# Patient Record
Sex: Female | Born: 1960 | Race: Black or African American | Hispanic: No | Marital: Married | Smoking: Never smoker
Health system: Southern US, Community
[De-identification: ages and names within clinical notes are randomized; demographics above are authoritative.]

## PROBLEM LIST (undated history)

## (undated) DIAGNOSIS — M1990 Unspecified osteoarthritis, unspecified site: Secondary | ICD-10-CM

## (undated) DIAGNOSIS — Z8711 Personal history of peptic ulcer disease: Secondary | ICD-10-CM

## (undated) DIAGNOSIS — T7840XA Allergy, unspecified, initial encounter: Secondary | ICD-10-CM

## (undated) DIAGNOSIS — I10 Essential (primary) hypertension: Secondary | ICD-10-CM

## (undated) DIAGNOSIS — R51 Headache: Secondary | ICD-10-CM

## (undated) DIAGNOSIS — E119 Type 2 diabetes mellitus without complications: Secondary | ICD-10-CM

## (undated) DIAGNOSIS — D649 Anemia, unspecified: Secondary | ICD-10-CM

## (undated) DIAGNOSIS — K648 Other hemorrhoids: Secondary | ICD-10-CM

## (undated) DIAGNOSIS — Z8719 Personal history of other diseases of the digestive system: Secondary | ICD-10-CM

## (undated) HISTORY — DX: Unspecified osteoarthritis, unspecified site: M19.90

## (undated) HISTORY — DX: Allergy, unspecified, initial encounter: T78.40XA

## (undated) HISTORY — DX: Headache: R51

## (undated) HISTORY — DX: Type 2 diabetes mellitus without complications: E11.9

## (undated) HISTORY — DX: Anemia, unspecified: D64.9

## (undated) HISTORY — PX: TUBAL LIGATION: SHX77

## (undated) HISTORY — DX: Essential (primary) hypertension: I10

## (undated) HISTORY — PX: CARPAL TUNNEL RELEASE: SHX101

## (undated) HISTORY — DX: Other hemorrhoids: K64.8

---

## 1998-10-27 ENCOUNTER — Emergency Department (HOSPITAL_COMMUNITY): Admission: EM | Admit: 1998-10-27 | Discharge: 1998-10-27 | Payer: Self-pay | Admitting: Emergency Medicine

## 1999-06-08 ENCOUNTER — Encounter: Payer: Self-pay | Admitting: Emergency Medicine

## 1999-06-08 ENCOUNTER — Emergency Department (HOSPITAL_COMMUNITY): Admission: EM | Admit: 1999-06-08 | Discharge: 1999-06-08 | Payer: Self-pay | Admitting: Emergency Medicine

## 1999-11-01 ENCOUNTER — Emergency Department (HOSPITAL_COMMUNITY): Admission: EM | Admit: 1999-11-01 | Discharge: 1999-11-01 | Payer: Self-pay | Admitting: Emergency Medicine

## 2000-10-19 ENCOUNTER — Inpatient Hospital Stay (HOSPITAL_COMMUNITY): Admission: AD | Admit: 2000-10-19 | Discharge: 2000-10-19 | Payer: Self-pay | Admitting: Obstetrics

## 2001-01-26 ENCOUNTER — Emergency Department (HOSPITAL_COMMUNITY): Admission: EM | Admit: 2001-01-26 | Discharge: 2001-01-26 | Payer: Self-pay | Admitting: Emergency Medicine

## 2001-01-27 ENCOUNTER — Encounter: Payer: Self-pay | Admitting: Emergency Medicine

## 2002-08-27 ENCOUNTER — Emergency Department (HOSPITAL_COMMUNITY): Admission: EM | Admit: 2002-08-27 | Discharge: 2002-08-27 | Payer: Self-pay | Admitting: *Deleted

## 2003-01-17 ENCOUNTER — Emergency Department (HOSPITAL_COMMUNITY): Admission: EM | Admit: 2003-01-17 | Discharge: 2003-01-18 | Payer: Self-pay | Admitting: Emergency Medicine

## 2003-10-24 ENCOUNTER — Emergency Department (HOSPITAL_COMMUNITY): Admission: AD | Admit: 2003-10-24 | Discharge: 2003-10-24 | Payer: Self-pay | Admitting: Family Medicine

## 2003-10-26 ENCOUNTER — Emergency Department (HOSPITAL_COMMUNITY): Admission: EM | Admit: 2003-10-26 | Discharge: 2003-10-26 | Payer: Self-pay | Admitting: Emergency Medicine

## 2004-01-03 ENCOUNTER — Emergency Department (HOSPITAL_COMMUNITY): Admission: EM | Admit: 2004-01-03 | Discharge: 2004-01-03 | Payer: Self-pay | Admitting: Emergency Medicine

## 2004-09-13 ENCOUNTER — Emergency Department (HOSPITAL_COMMUNITY): Admission: EM | Admit: 2004-09-13 | Discharge: 2004-09-13 | Payer: Self-pay | Admitting: Emergency Medicine

## 2006-08-10 ENCOUNTER — Emergency Department (HOSPITAL_COMMUNITY): Admission: EM | Admit: 2006-08-10 | Discharge: 2006-08-10 | Payer: Self-pay | Admitting: Emergency Medicine

## 2007-08-01 ENCOUNTER — Emergency Department (HOSPITAL_COMMUNITY): Admission: EM | Admit: 2007-08-01 | Discharge: 2007-08-01 | Payer: Self-pay | Admitting: Emergency Medicine

## 2007-09-02 ENCOUNTER — Emergency Department (HOSPITAL_COMMUNITY): Admission: EM | Admit: 2007-09-02 | Discharge: 2007-09-02 | Payer: Self-pay | Admitting: Emergency Medicine

## 2008-01-10 ENCOUNTER — Emergency Department (HOSPITAL_COMMUNITY): Admission: EM | Admit: 2008-01-10 | Discharge: 2008-01-10 | Payer: Self-pay | Admitting: Family Medicine

## 2008-08-20 ENCOUNTER — Emergency Department (HOSPITAL_COMMUNITY): Admission: EM | Admit: 2008-08-20 | Discharge: 2008-08-20 | Payer: Self-pay | Admitting: Emergency Medicine

## 2008-09-14 ENCOUNTER — Emergency Department (HOSPITAL_COMMUNITY): Admission: EM | Admit: 2008-09-14 | Discharge: 2008-09-14 | Payer: Self-pay | Admitting: Emergency Medicine

## 2008-09-27 ENCOUNTER — Emergency Department (HOSPITAL_COMMUNITY): Admission: EM | Admit: 2008-09-27 | Discharge: 2008-09-27 | Payer: Self-pay | Admitting: Family Medicine

## 2009-02-11 ENCOUNTER — Emergency Department (HOSPITAL_COMMUNITY): Admission: EM | Admit: 2009-02-11 | Discharge: 2009-02-11 | Payer: Self-pay | Admitting: Family Medicine

## 2009-05-05 ENCOUNTER — Emergency Department (HOSPITAL_COMMUNITY): Admission: EM | Admit: 2009-05-05 | Discharge: 2009-05-05 | Payer: Self-pay | Admitting: Emergency Medicine

## 2009-08-27 ENCOUNTER — Emergency Department (HOSPITAL_COMMUNITY): Admission: EM | Admit: 2009-08-27 | Discharge: 2009-08-27 | Payer: Self-pay | Admitting: Emergency Medicine

## 2010-01-26 ENCOUNTER — Ambulatory Visit: Payer: Self-pay | Admitting: Internal Medicine

## 2010-01-26 DIAGNOSIS — J45909 Unspecified asthma, uncomplicated: Secondary | ICD-10-CM | POA: Insufficient documentation

## 2010-01-26 DIAGNOSIS — J309 Allergic rhinitis, unspecified: Secondary | ICD-10-CM | POA: Insufficient documentation

## 2010-01-26 DIAGNOSIS — R519 Headache, unspecified: Secondary | ICD-10-CM | POA: Insufficient documentation

## 2010-01-26 DIAGNOSIS — R51 Headache: Secondary | ICD-10-CM

## 2010-02-05 ENCOUNTER — Ambulatory Visit: Payer: Self-pay | Admitting: Internal Medicine

## 2010-02-05 DIAGNOSIS — R9431 Abnormal electrocardiogram [ECG] [EKG]: Secondary | ICD-10-CM

## 2010-02-05 DIAGNOSIS — R0789 Other chest pain: Secondary | ICD-10-CM | POA: Insufficient documentation

## 2010-02-05 LAB — CONVERTED CEMR LAB
ALT: 20 units/L (ref 0–35)
AST: 18 units/L (ref 0–37)
Albumin: 3.4 g/dL — ABNORMAL LOW (ref 3.5–5.2)
Alkaline Phosphatase: 124 units/L — ABNORMAL HIGH (ref 39–117)
BUN: 11 mg/dL (ref 6–23)
Basophils Absolute: 0 10*3/uL (ref 0.0–0.1)
Basophils Relative: 1 % (ref 0.0–3.0)
Bilirubin, Direct: 0.1 mg/dL (ref 0.0–0.3)
CK-MB: 1.4 ng/mL (ref 0.3–4.0)
CO2: 27 meq/L (ref 19–32)
Calcium: 9 mg/dL (ref 8.4–10.5)
Chloride: 106 meq/L (ref 96–112)
Cholesterol: 163 mg/dL (ref 0–200)
Creatinine, Ser: 1 mg/dL (ref 0.4–1.2)
Eosinophils Absolute: 0.1 10*3/uL (ref 0.0–0.7)
Eosinophils Relative: 2.7 % (ref 0.0–5.0)
GFR calc non Af Amer: 75.91 mL/min (ref >60)
Glucose, Bld: 90 mg/dL (ref 70–99)
HCT: 36.8 % (ref 36.0–46.0)
HDL: 46.5 mg/dL (ref >39.00)
Hemoglobin: 11.6 g/dL — ABNORMAL LOW (ref 12.0–15.0)
LDL Cholesterol: 104 mg/dL — ABNORMAL HIGH (ref 0–99)
Lymphocytes Relative: 55.9 % — ABNORMAL HIGH (ref 12.0–46.0)
Lymphs Abs: 2.7 10*3/uL (ref 0.7–4.0)
MCHC: 31.6 g/dL (ref 30.0–36.0)
MCV: 83.5 fL (ref 78.0–100.0)
Monocytes Absolute: 0.4 10*3/uL (ref 0.1–1.0)
Monocytes Relative: 7.9 % (ref 3.0–12.0)
Neutro Abs: 1.5 10*3/uL (ref 1.4–7.7)
Neutrophils Relative %: 32.5 % — ABNORMAL LOW (ref 43.0–77.0)
Platelets: 200 10*3/uL (ref 150.0–400.0)
Potassium: 4.1 meq/L (ref 3.5–5.1)
RBC: 4.41 M/uL (ref 3.87–5.11)
RDW: 14.6 % (ref 11.5–14.6)
Sodium: 141 meq/L (ref 135–145)
TSH: 1.06 microintl units/mL (ref 0.35–5.50)
Total Bilirubin: 0.6 mg/dL (ref 0.3–1.2)
Total CHOL/HDL Ratio: 4
Total CK: 178 units/L — ABNORMAL HIGH (ref 7–177)
Total Protein: 7.8 g/dL (ref 6.0–8.3)
Triglycerides: 63 mg/dL (ref 0.0–149.0)
VLDL: 12.6 mg/dL (ref 0.0–40.0)
WBC: 4.7 10*3/uL (ref 4.5–10.5)

## 2010-02-09 ENCOUNTER — Telehealth (INDEPENDENT_AMBULATORY_CARE_PROVIDER_SITE_OTHER): Payer: Self-pay | Admitting: *Deleted

## 2010-02-10 ENCOUNTER — Ambulatory Visit: Payer: Self-pay

## 2010-02-10 ENCOUNTER — Encounter (HOSPITAL_COMMUNITY): Admission: RE | Admit: 2010-02-10 | Discharge: 2010-04-08 | Payer: Self-pay | Admitting: Internal Medicine

## 2010-02-10 ENCOUNTER — Ambulatory Visit: Payer: Self-pay | Admitting: Cardiology

## 2010-02-11 ENCOUNTER — Ambulatory Visit: Payer: Self-pay | Admitting: Internal Medicine

## 2010-02-20 ENCOUNTER — Telehealth: Payer: Self-pay | Admitting: Internal Medicine

## 2010-02-23 ENCOUNTER — Ambulatory Visit: Payer: Self-pay | Admitting: Internal Medicine

## 2010-07-29 ENCOUNTER — Telehealth: Payer: Self-pay | Admitting: Internal Medicine

## 2010-11-23 ENCOUNTER — Telehealth: Payer: Self-pay | Admitting: Internal Medicine

## 2010-12-08 NOTE — Assessment & Plan Note (Signed)
Summary: ear complaints / SD   Vital Signs:  Patient profile:   50 year old female Menstrual status:  irregular Height:      61.5 inches Weight:      226 pounds BMI:     42.16 O2 Sat:      96 % on Room air Temp:     97.9 degrees F oral Pulse rate:   73 / minute Pulse rhythm:   regular Resp:     16 per minute BP sitting:   102 / 68  (left arm) Cuff size:   large  Vitals Entered By: Rock Nephew CMA (February 11, 2010 11:00 AM)  Nutrition Counseling: Patient's BMI is greater than 25 and therefore counseled on weight management options.  O2 Flow:  Room air  Primary Care Provider:  Etta Grandchild MD  CC:  Ear pain.  History of Present Illness:  Ear Pain      This is a 50 year old woman who presents with Ear pain.  The symptoms began 2 days ago.  The severity is described as mild.  The patient also reports sensation of fullness, but denies ear discharge, hearing loss, tinnitus, fever, sinus pain, nasal discharge, and jaw click.  The pain is located in both ears.  The pain is described as constant.  The patient denies headache, night grinding of teeth, popping or crackling sounds, pressure, toothache, dizziness, and vertigo.    Preventive Screening-Counseling & Management  Alcohol-Tobacco     Alcohol drinks/day: 0     Smoking Status: never  Hep-HIV-STD-Contraception     Hepatitis Risk: no risk noted     HIV Risk: no risk noted     STD Risk: no risk noted  Medications Prior to Update: 1)  None  Current Medications (verified): 1)  Nasonex 50 Mcg/act Susp (Mometasone Furoate) .... 2 Puffs Each Nostril Once Daily  Allergies (verified): No Known Drug Allergies  Past History:  Past Medical History: Reviewed history from 01/26/2010 and no changes required. Allergic rhinitis Asthma Headache  Past Surgical History: Reviewed history from 01/26/2010 and no changes required. Tubal ligation  Family History: Reviewed history from 01/26/2010 and no changes  required. Family History of Arthritis Family History Diabetes 1st degree relative Family History Hypertension  Social History: Reviewed history from 01/26/2010 and no changes required. Married Unemployed Never Smoked Alcohol use-no Drug use-no Regular exercise-no  Review of Systems  The patient denies anorexia, fever, weight loss, chest pain, peripheral edema, prolonged cough, abdominal pain, hematuria, suspicious skin lesions, and enlarged lymph nodes.   ENT:  Complains of earache, nasal congestion, and postnasal drainage; denies decreased hearing, difficulty swallowing, ear discharge, hoarseness, nosebleeds, ringing in ears, sinus pressure, and sore throat.  Physical Exam  General:  alert, well-developed, well-nourished, well-hydrated, appropriate dress, normal appearance, healthy-appearing, and cooperative to examination.   Head:  normocephalic, atraumatic, no abnormalities observed, and no abnormalities palpated.   Eyes:  vision grossly intact, pupils equal, pupils round, and pupils reactive to light.   Ears:  R ear normal and L ear normal.   Nose:  no airflow obstruction, no intranasal foreign body, no nasal polyps, no nasal mucosal lesions, no mucosal friability, no active bleeding or clots, no sinus percussion tenderness, no septum abnormalities, nasal dischargemucosal pallor, and mucosal edema.   Mouth:  Oral mucosa and oropharynx without lesions or exudates.  Teeth in good repair. Neck:  No deformities, masses, or tenderness noted. Lungs:  Normal respiratory effort, chest expands symmetrically. Lungs are clear  to auscultation, no crackles or wheezes. Heart:  Normal rate and regular rhythm. S1 and S2 normal without gallop, murmur, click, rub or other extra sounds. Abdomen:  Bowel sounds positive,abdomen soft and non-tender without masses, organomegaly or hernias noted. Msk:  No deformity or scoliosis noted of thoracic or lumbar spine.   Pulses:  R and L  carotid,radial,femoral,dorsalis pedis and posterior tibial pulses are full and equal bilaterally Extremities:  No clubbing, cyanosis, edema, or deformity noted with normal full range of motion of all joints.   Neurologic:  No cranial nerve deficits noted. Station and gait are normal. Plantar reflexes are down-going bilaterally. DTRs are symmetrical throughout. Sensory, motor and coordinative functions appear intact. Skin:  Intact without suspicious lesions or rashes Cervical Nodes:  no anterior cervical adenopathy and no posterior cervical adenopathy.   Axillary Nodes:  no R axillary adenopathy and no L axillary adenopathy.   Psych:  Cognition and judgment appear intact. Alert and cooperative with normal attention span and concentration. No apparent delusions, illusions, hallucinations   Impression & Recommendations:  Problem # 1:  ALLERGIC RHINITIS (ICD-477.9) Assessment Deteriorated  try depo-medrol IM Her updated medication list for this problem includes:    Nasonex 50 Mcg/act Susp (Mometasone furoate) .Marland Kitchen... 2 puffs each nostril once daily  Discussed use of allergy medications and environmental measures.   Orders: Admin of Therapeutic Inj  intramuscular or subcutaneous (56387) Depo- Medrol 80mg  (J1040) Depo- Medrol 40mg  (J1030)  Complete Medication List: 1)  Nasonex 50 Mcg/act Susp (Mometasone furoate) .... 2 puffs each nostril once daily  Patient Instructions: 1)  Please schedule a follow-up appointment in 2 months. Prescriptions: NASONEX 50 MCG/ACT SUSP (MOMETASONE FUROATE) 2 puffs each nostril once daily  #4 inhs. x 0   Entered and Authorized by:   Etta Grandchild MD   Signed by:   Etta Grandchild MD on 02/11/2010   Method used:   Samples Given   RxID:   [id number]    Medication Administration  Injection # 1:    Medication: Depo- Medrol 40mg     Diagnosis: ALLERGIC RHINITIS (ICD-477.9)    Route: IM    Site: R deltoid    Exp Date: 09/2012    Lot #: [id number]     Mfr: pfizer    Patient tolerated injection without complications    Given by: Rock Nephew CMA (February 11, 2010 11:01 AM)  Injection # 2:    Medication: Depo- Medrol 80mg     Diagnosis: ALLERGIC RHINITIS (ICD-477.9)    Route: IM    Site: R deltoid    Exp Date: 09/2012    Lot #: [id number]    Mfr: pfizer    Patient tolerated injection without complications    Given by: Rock Nephew CMA (February 11, 2010 11:01 AM)  Orders Added: 1)  Admin of Therapeutic Inj  intramuscular or subcutaneous [96372] 2)  Depo- Medrol 80mg  [J1040] 3)  Depo- Medrol 40mg  [J1030] 4)  Est. Patient Level IV [63016]

## 2010-12-08 NOTE — Progress Notes (Signed)
  Phone Note Other Incoming   Caller: pt. Summary of Call: she called requesting a cough medicine Initial call taken by: Etta Grandchild MD,  February 20, 2010 2:03 PM  Follow-up for Phone Call        Rx was given to her husband today for her to fill it Follow-up by: Etta Grandchild MD,  February 20, 2010 2:04 PM    New/Updated Medications: Sandria Senter ER 8-10 MG/5ML LQCR (CHLORPHENIRAMINE-HYDROCODONE) 5 ml by mouth two times a day as needed for cough Prescriptions: TUSSIONEX PENNKINETIC ER 8-10 MG/5ML LQCR (CHLORPHENIRAMINE-HYDROCODONE) 5 ml by mouth two times a day as needed for cough  #4 ounces x 0   Entered and Authorized by:   Etta Grandchild MD   Signed by:   Etta Grandchild MD on 02/20/2010   Method used:   Print then Give to Patient   RxID:   [id number]

## 2010-12-08 NOTE — Assessment & Plan Note (Signed)
Summary: continued dizzyness/ SD   Vital Signs:  Patient profile:   50 year old female Menstrual status:  irregular Height:      61 inches Weight:      227 pounds BMI:     43.05 O2 Sat:      98 % on Room air Temp:     98.3 degrees F oral Pulse rate:   62 / minute Pulse rhythm:   regular Resp:     16 per minute BP sitting:   122 / 80  (left arm) Cuff size:   large  Vitals Entered By: Rock Nephew CMA (February 05, 2010 10:08 AM)  Nutrition Counseling: Patient's BMI is greater than 25 and therefore counseled on weight management options.  O2 Flow:  Room air CC: dizziness w/ SOB and chest pain Is Patient Diabetic? No Pain Assessment Patient in pain? yes     Location: chest   Primary Care Provider:  Etta Grandchild MD  CC:  dizziness w/ SOB and chest pain.  History of Present Illness: She returns and states that her URI symptoms have resolved but she has developed some chest pain, DOE, and dizziness for about one week intermittently.  Preventive Screening-Counseling & Management  Alcohol-Tobacco     Alcohol drinks/day: 0     Smoking Status: never  Hep-HIV-STD-Contraception     Hepatitis Risk: no risk noted     HIV Risk: no risk noted     STD Risk: no risk noted      Sexual History:  currently monogamous.        Drug Use:  no.        Blood Transfusions:  no.    Medications Prior to Update: 1)  Mytussin Ac 100-10 Mg/31ml Syrp (Guaifenesin-Codeine) .... 5-10 Ml By Mouth Qid As Needed For Cough  Current Medications (verified): 1)  None  Allergies (verified): No Known Drug Allergies  Past History:  Past Medical History: Reviewed history from 01/26/2010 and no changes required. Allergic rhinitis Asthma Headache  Past Surgical History: Reviewed history from 01/26/2010 and no changes required. Tubal ligation  Family History: Reviewed history from 01/26/2010 and no changes required. Family History of Arthritis Family History Diabetes 1st degree  relative Family History Hypertension  Social History: Reviewed history from 01/26/2010 and no changes required. Married Unemployed Never Smoked Alcohol use-no Drug use-no Regular exercise-no  Review of Systems       The patient complains of weight gain, chest pain, and dyspnea on exertion.  The patient denies anorexia, fever, weight loss, syncope, peripheral edema, prolonged cough, headaches, hemoptysis, abdominal pain, melena, hematochezia, severe indigestion/heartburn, hematuria, suspicious skin lesions, difficulty walking, depression, enlarged lymph nodes, and angioedema.   CV:  Complains of chest pain or discomfort, lightheadness, and shortness of breath with exertion; denies bluish discoloration of lips or nails, difficulty breathing at night, difficulty breathing while lying down, fainting, fatigue, leg cramps with exertion, near fainting, palpitations, swelling of feet, swelling of hands, and weight gain. Resp:  Complains of chest discomfort; denies chest pain with inspiration, cough, coughing up blood, excessive snoring, hypersomnolence, morning headaches, pleuritic, shortness of breath, sputum productive, and wheezing.  Physical Exam  General:  Well-developed,well-nourished,in no acute distress; alert,appropriate and cooperative throughout examination and overweight-appearing.   Head:  Normocephalic and atraumatic without obvious abnormalities. No apparent alopecia or balding. Eyes:  vision grossly intact, pupils equal, pupils round, and pupils reactive to light.   Mouth:  Oral mucosa and oropharynx without lesions or exudates.  Teeth in  good repair. Neck:  No deformities, masses, or tenderness noted. Lungs:  Normal respiratory effort, chest expands symmetrically. Lungs are clear to auscultation, no crackles or wheezes. Heart:  Normal rate and regular rhythm. S1 and S2 normal without gallop, murmur, click, rub or other extra sounds. Abdomen:  Bowel sounds positive,abdomen soft and  non-tender without masses, organomegaly or hernias noted. Msk:  No deformity or scoliosis noted of thoracic or lumbar spine.   Pulses:  R and L carotid,radial,femoral,dorsalis pedis and posterior tibial pulses are full and equal bilaterally Extremities:  No clubbing, cyanosis, edema, or deformity noted with normal full range of motion of all joints.   Neurologic:  No cranial nerve deficits noted. Station and gait are normal. Plantar reflexes are down-going bilaterally. DTRs are symmetrical throughout. Sensory, motor and coordinative functions appear intact. Skin:  Intact without suspicious lesions or rashes Cervical Nodes:  no anterior cervical adenopathy and no posterior cervical adenopathy.   Axillary Nodes:  no R axillary adenopathy and no L axillary adenopathy.   Psych:  Cognition and judgment appear intact. Alert and cooperative with normal attention span and concentration. No apparent delusions, illusions, hallucinations Additional Exam:  EKG shows NSR with diffuse flattening of the T waves but normal ST segments and no Q waves. There are no old EKG's for comparison.   Impression & Recommendations:  Problem # 1:  ELECTROCARDIOGRAM, ABNORMAL (ICD-794.31) Assessment New will look at labs for metabolic causes of abn. EKG and will look for ischemia/PE/CHF/abnormal lytes/renal Orders: Venipuncture ([address]) TLB-BMP (Basic Metabolic Panel-BMET) (80048-METABOL) TLB-CBC Platelet - w/Differential (85025-CBCD) TLB-Hepatic/Liver Function Pnl (80076-HEPATIC) TLB-TSH (Thyroid Stimulating Hormone) (84443-TSH) TLB-Lipid Panel (80061-LIPID) TLB-CK-MB (Creatine Kinase MB) (82553-CKMB) TLB-CK Total Only(Creatine Kinase/CPK) (82550-CK) T-D-Dimer Fibrin Derivatives Quantitive (60454-09811) Cardiolite (Cardiolite) EKG w/ Interpretation (93000)  Problem # 2:  CHEST PAIN, ATYPICAL (ICD-786.59) Assessment: New will screen for CAD with a cardiolite and check for ischemia today. Orders: Venipuncture  (91478) TLB-BMP (Basic Metabolic Panel-BMET) (80048-METABOL) TLB-CBC Platelet - w/Differential (85025-CBCD) TLB-Hepatic/Liver Function Pnl (80076-HEPATIC) TLB-TSH (Thyroid Stimulating Hormone) (84443-TSH) TLB-Lipid Panel (80061-LIPID) TLB-CK-MB (Creatine Kinase MB) (82553-CKMB) TLB-CK Total Only(Creatine Kinase/CPK) (82550-CK) T-D-Dimer Fibrin Derivatives Quantitive (29562-13086) Cardiolite (Cardiolite) EKG w/ Interpretation (93000)  Patient Instructions: 1)  Please schedule a follow-up appointment in 2 weeks.

## 2010-12-08 NOTE — Assessment & Plan Note (Signed)
Summary: NEW/ [employer]/ HEADACHE/DIZZY/SINUS CONGESTION/NWS   Vital Signs:  Patient profile:   50 year old female Menstrual status:  irregular LMP:     12/22/2009 Height:      61 inches Weight:      225.50 pounds BMI:     42.76 O2 Sat:      96 % on Room air Temp:     98.3 degrees F oral Pulse rate:   87 / minute Pulse rhythm:   regular Resp:     16 per minute BP sitting:   138 / 82  (left arm) Cuff size:   large  Vitals Entered By: Rock Nephew CMA (January 26, 2010 3:40 PM)  Nutrition Counseling: Patient's BMI is greater than 25 and therefore counseled on weight management options.  O2 Flow:  Room air CC: headache, congestion, cough, dizziness, L side ear pain x 01/23/10, URI symptoms Is Patient Diabetic? No Pain Assessment Patient in pain? yes     Location: L ear LMP (date): 12/22/2009     Menstrual Status irregular Enter LMP: 12/22/2009   Primary Care Provider:  Etta Grandchild MD  CC:  headache, congestion, cough, dizziness, L side ear pain x 01/23/10, and URI symptoms.  History of Present Illness:  URI Symptoms      This is a 50 year old woman who presents with URI symptoms.  The symptoms began 2 weeks ago.  The severity is described as moderate.  The patient reports nasal congestion, purulent nasal discharge, sore throat, and dry cough, but denies productive cough, earache, and sick contacts.  The patient denies fever, stiff neck, dyspnea, wheezing, rash, vomiting, diarrhea, use of an antipyretic, and response to antipyretic.  The patient denies sneezing, seasonal symptoms, headache, muscle aches, and severe fatigue.  Risk factors for Strep sinusitis include unilateral facial pain, unilateral nasal discharge, poor response to decongestant, and double sickening.  The patient denies the following risk factors for Strep sinusitis: tooth pain, Strep exposure, tender adenopathy, and absence of cough.    Preventive Screening-Counseling & Management  Alcohol-Tobacco  Alcohol drinks/day: 0     Smoking Status: never  Caffeine-Diet-Exercise     Does Patient Exercise: no  Hep-HIV-STD-Contraception     Hepatitis Risk: no risk noted     HIV Risk: no risk noted     STD Risk: no risk noted      Sexual History:  currently monogamous.        Drug Use:  no.        Blood Transfusions:  no.    Medications Prior to Update: 1)  None  Current Medications (verified): 1)  Amoxicillin 500 Mg Cap (Amoxicillin) .... Take 1 Capsule By Mouth Three Times A Day X 10 Days 2)  Mytussin Ac 100-10 Mg/15ml Syrp (Guaifenesin-Codeine) .... 5-10 Ml By Mouth Qid As Needed For Cough  Allergies (verified): No Known Drug Allergies  Past History:  Past Medical History: Allergic rhinitis Asthma Headache  Past Surgical History: Tubal ligation  Family History: Family History of Arthritis Family History Diabetes 1st degree relative Family History Hypertension  Social History: Married Unemployed Never Smoked Alcohol use-no Drug use-no Regular exercise-no Smoking Status:  never Hepatitis Risk:  no risk noted HIV Risk:  no risk noted STD Risk:  no risk noted Sexual History:  currently monogamous Blood Transfusions:  no Drug Use:  no Does Patient Exercise:  no  Review of Systems       The patient complains of weight gain.  The patient denies  anorexia, fever, weight loss, chest pain, syncope, dyspnea on exertion, peripheral edema, prolonged cough, hemoptysis, abdominal pain, hematuria, suspicious skin lesions, enlarged lymph nodes, and angioedema.    Physical Exam  General:  Well-developed,well-nourished,in no acute distress; alert,appropriate and cooperative throughout examination Head:  Normocephalic and atraumatic without obvious abnormalities. No apparent alopecia or balding. Ears:  R ear normal and L ear normal.   Nose:  no external deformity, no airflow obstruction, no intranasal foreign body, no nasal polyps, no nasal mucosal lesions, no mucosal  friability, no active bleeding or clots, no septum abnormalities, nasal dischargemucosal pallor, L maxillary sinus tenderness, and R maxillary sinus tenderness.   Mouth:  Oral mucosa and oropharynx without lesions or exudates.  Teeth in good repair. Neck:  No deformities, masses, or tenderness noted. Lungs:  Normal respiratory effort, chest expands symmetrically. Lungs are clear to auscultation, no crackles or wheezes. Heart:  Normal rate and regular rhythm. S1 and S2 normal without gallop, murmur, click, rub or other extra sounds. Abdomen:  Bowel sounds positive,abdomen soft and non-tender without masses, organomegaly or hernias noted. Msk:  No deformity or scoliosis noted of thoracic or lumbar spine.   Pulses:  R and L carotid,radial,femoral,dorsalis pedis and posterior tibial pulses are full and equal bilaterally Extremities:  No clubbing, cyanosis, edema, or deformity noted with normal full range of motion of all joints.   Neurologic:  No cranial nerve deficits noted. Station and gait are normal. Plantar reflexes are down-going bilaterally. DTRs are symmetrical throughout. Sensory, motor and coordinative functions appear intact. Skin:  Intact without suspicious lesions or rashes Cervical Nodes:  no anterior cervical adenopathy and no posterior cervical adenopathy.   Axillary Nodes:  no R axillary adenopathy and no L axillary adenopathy.   Psych:  Cognition and judgment appear intact. Alert and cooperative with normal attention span and concentration. No apparent delusions, illusions, hallucinations   Impression & Recommendations:  Problem # 1:  SINUSITIS- ACUTE-NOS (ICD-461.9) Assessment New  Her updated medication list for this problem includes:    Amoxicillin 500 Mg Cap (Amoxicillin) .Marland Kitchen... Take 1 capsule by mouth three times a day x 10 days    Mytussin Ac 100-10 Mg/12ml Syrp (Guaifenesin-codeine) .Marland Kitchen... 5-10 ml by mouth qid as needed for cough  Instructed on treatment. Call if symptoms  persist or worsen.   Complete Medication List: 1)  Amoxicillin 500 Mg Cap (Amoxicillin) .... Take 1 capsule by mouth three times a day x 10 days 2)  Mytussin Ac 100-10 Mg/79ml Syrp (Guaifenesin-codeine) .... 5-10 ml by mouth qid as needed for cough  Patient Instructions: 1)  Please schedule a follow-up appointment in 1 month. 2)  It is important that you exercise regularly at least 20 minutes 5 times a week. If you develop chest pain, have severe difficulty breathing, or feel very tired , stop exercising immediately and seek medical attention. 3)  You need to lose weight. Consider a lower calorie diet and regular exercise.  4)  Schedule your mammogram. 5)  You need to have a Pap Smear to prevent cervical cancer. 6)  If you could be exposed to sexually transmitted diseases, you should use a condom. 7)  Take your antibiotic as prescribed until ALL of it is gone, but stop if you develop a rash or swelling and contact our office as soon as possible. 8)  Acute sinusitis symptoms for less than 10 days are not helped by antibiotics.Use warm moist compresses, and over the counter decongestants ( only as directed). Call if no  improvement in 5-7 days, sooner if increasing pain, fever, or new symptoms. Prescriptions: MYTUSSIN AC 100-10 MG/5ML SYRP (GUAIFENESIN-CODEINE) 5-10 ml by mouth QID as needed for cough  #8 ounces x 0   Entered and Authorized by:   Etta Grandchild MD   Signed by:   Etta Grandchild MD on 01/26/2010   Method used:   Print then Give to Patient   RxID:   [id number] AMOXICILLIN 500 MG CAP (AMOXICILLIN) Take 1 capsule by mouth three times a day X 10 days  #30 x 0   Entered and Authorized by:   Etta Grandchild MD   Signed by:   Etta Grandchild MD on 01/26/2010   Method used:   Print then Give to Patient   RxID:   [id number]

## 2010-12-08 NOTE — Letter (Signed)
Summary: Lipid Letter  [facility]  [address] [place], [place] [address]   Phone: [phone]  Fax: [phone]    02/05/2010  [name] [address] [place], [place]  [address]  Dear [name]:  We have carefully reviewed your last lipid profile from 02/05/2010 and the results are noted below with a summary of recommendations for lipid management.    Cholesterol:       163     Goal: <200   HDL "good" Cholesterol:   57.84     Goal: >40   LDL "bad" Cholesterol:   104     Goal: <130   Triglycerides:       63.0     Goal: <150    EXCELLENT RESULTS!    TLC Diet (Therapeutic Lifestyle Change): Saturated Fats & Transfatty acids should be kept < 7% of total calories ***Reduce Saturated Fats Polyunstaurated Fat can be up to 10% of total calories Monounsaturated Fat Fat can be up to 20% of total calories Total Fat should be no greater than 25-35% of total calories Carbohydrates should be 50-60% of total calories Protein should be approximately 15% of total calories Fiber should be at least 20-30 grams a day ***Increased fiber may help lower LDL Total Cholesterol should be < 200mg /day Consider adding plant stanol/sterols to diet (example: Benacol spread) ***A higher intake of unsaturated fat may reduce Triglycerides and Increase HDL    Adjunctive Measures (may lower LIPIDS and reduce risk of Heart Attack) include: Aerobic Exercise (20-30 minutes 3-4 times a week) Limit Alcohol Consumption Weight Reduction Aspirin 75-81 mg a day by mouth (if not allergic or contraindicated) Dietary Fiber 20-30 grams a day by mouth     Current Medications:  None If you have any questions, please call. We appreciate being able to work with you.   Sincerely,    [facility] Etta Grandchild MD

## 2010-12-08 NOTE — Progress Notes (Signed)
Summary: CREAM   Phone Note Call from Patient Call back at Home Phone [phone]   Summary of Call: Patient is requesting to know if cream sent in is ok to use on her face. She was previously on desonide 0.05%.  Initial call taken by: Lamar Sprinkles, CMA,  July 29, 2010 3:54 PM  Follow-up for Phone Call        Pt informed  Follow-up by: Lamar Sprinkles, CMA,  July 29, 2010 5:52 PM    New/Updated Medications: DESONIDE 0.05 % LOTN (DESONIDE) Apply to AA two times a day as directed Prescriptions: DESONIDE 0.05 % LOTN (DESONIDE) Apply to AA two times a day as directed  #60 gms x 1   Entered and Authorized by:   Etta Grandchild MD   Signed by:   Etta Grandchild MD on 07/29/2010   Method used:   Electronically to        [facility] [phone]* (retail)       [address]       [place], [place]  [address]       Ph: [phone]       Fax: [phone]   RxID:   [id number]

## 2010-12-08 NOTE — Assessment & Plan Note (Signed)
Summary: Cardiology nuclear Study  Nuclear Med Background Indications for Stress Test: Evaluation for Ischemia, Abnormal EKG   History: Asthma  History Comments: NO DOCUMENTED CAD  Symptoms: Chest Tightness, Chest Tightness with Exertion, Diaphoresis, Dizziness, DOE, Fatigue  Symptoms Comments: Last episode of CP:2-3 days ago.   Nuclear Pre-Procedure Cardiac Risk Factors: Obesity Caffeine/Decaff Intake: none NPO After: 10:00 PM Lungs: Clear.  O2 Sat 98% on RA. 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  [employer], CMA-N  Nuclear Med Study 1 or 2 day study:  1 day     Stress Test Type:  Eugenie Birks Reading MD:  Olga Millers, MD     Referring MD:  Sanda Linger, MD Resting Radionuclide:  Technetium 47m Tetrofosmin     Resting Radionuclide Dose:  11.0 mCi  Stress Radionuclide:  Technetium 90m Tetrofosmin     Stress Radionuclide Dose:  33.0 mCi   Stress Protocol   Lexiscan: 0.4 mg   Stress Test Technologist:  [employer] CMA-N     Nuclear Technologist:  Domenic Polite CNMT  Rest Procedure  Myocardial perfusion imaging was performed at rest 45 minutes following the intravenous administration of Myoview Technetium 24m Tetrofosmin.  Stress Procedure  The patient received IV Lexiscan 0.4 mg over 15-seconds.  Myoview injected at 30-seconds.  There were more diffuse T- wave changes with lexiscan.  Quantitative spect images were obtained after a 45 minute delay.  QPS Raw Data Images:  Acuisition technically good; normal left ventricular size. Stress Images:  There is normal uptake in all areas. Rest Images:   Normal homogeneous uptake in all areas of the myocardium. Subtraction (SDS):  No evidence of ischemia. Transient Ischemic Dilatation:  1.13  (Normal <1.22)  Lung/Heart Ratio:  .44  (Normal <0.45)  Quantitative Gated Spect Images QGS EDV:  64 ml QGS ESV:  19 ml QGS EF:  70 % QGS cine images:  Normal wall motion.   Overall Impression  Exercise Capacity: Lexiscan study with no exercise. BP Response: Normal blood pressure response. Clinical Symptoms: No chest pain ECG Impression: No significant ST segment change suggestive of ischemia. Overall Impression: There is no sign of scar or ischemia.

## 2010-12-08 NOTE — Progress Notes (Signed)
Summary: Nuclear Pre-Procedure  Phone Note Outgoing Call   Call placed by: Milana Na, EMT-P,  February 09, 2010 3:16 PM Summary of Call: Reviewed information on Myoview Information Sheet (see scanned document for further details).  Spoke with patient.     Nuclear Med Background Indications for Stress Test: Evaluation for Ischemia, Abnormal EKG   History: Asthma   Symptoms: Chest Pain, Dizziness, DOE, SOB    Nuclear Pre-Procedure Height (in): 61  Nuclear Med Study Referring MD:  T.Jones

## 2010-12-08 NOTE — Progress Notes (Signed)
  Phone Note Refill Request   triamcinolone cream for exema. Pt is having out break and would like cream. What do you advise  Initial call taken by: Ami Bullins CMA,  July 29, 2010 9:14 AM    New/Updated Medications: TRIAMCINOLONE ACETONIDE 0.5 % CREA (TRIAMCINOLONE ACETONIDE) Apply to AA BID Prescriptions: TRIAMCINOLONE ACETONIDE 0.5 % CREA (TRIAMCINOLONE ACETONIDE) Apply to AA BID  #60 gms x 1   Entered by:   Ami Bullins CMA   Authorized by:   Etta Grandchild MD   Signed by:   Bill Salinas CMA on 07/29/2010   Method used:   Electronically to        [facility] [phone]* (retail)       [address]       [place], [place]  [address]       Ph: [phone]       Fax: [phone]   RxID:   [id number] TRIAMCINOLONE ACETONIDE 0.5 % CREA (TRIAMCINOLONE ACETONIDE) Apply to AA BID  #60 gms x 1   Entered and Authorized by:   Etta Grandchild MD   Signed by:   Etta Grandchild MD on 07/29/2010   Method used:   Historical   RxID:   [id number]

## 2010-12-08 NOTE — Assessment & Plan Note (Signed)
Summary: 1 MO ROV /NWS #   Vital Signs:  Patient profile:   50 year old female Menstrual status:  irregular Height:      61.5 inches Weight:      224.25 pounds BMI:     41.84 O2 Sat:      97 % on Room air Temp:     98.6 degrees F oral Pulse rate:   55 / minute Pulse rhythm:   regular Resp:     16 per minute BP sitting:   120 / 78  (left arm)  Vitals Entered By: Rock Nephew CMA (February 23, 2010 10:10 AM)  Nutrition Counseling: Patient's BMI is greater than 25 and therefore counseled on weight management options.  O2 Flow:  Room air CC: follow up Is Patient Diabetic? No Pain Assessment Patient in pain? no        Primary Care Provider:  Etta Grandchild MD  CC:  follow up.  History of Present Illness: She returns for f/up and she feels much better but she still has a mild "echo" sensation in her left ear.  Preventive Screening-Counseling & Management  Alcohol-Tobacco     Alcohol drinks/day: 0     Smoking Status: never  Hep-HIV-STD-Contraception     Hepatitis Risk: no risk noted     HIV Risk: no risk noted     STD Risk: no risk noted      Sexual History:  currently monogamous.        Drug Use:  no.        Blood Transfusions:  no.    Clinical Review Panels:  Lipid Management   Cholesterol:  163 (02/05/2010)   LDL (bad choesterol):  104 (02/05/2010)   HDL (good cholesterol):  46.50 (02/05/2010)  CBC   WBC:  4.7 (02/05/2010)   RBC:  4.41 (02/05/2010)   Hgb:  11.6 (02/05/2010)   Hct:  36.8 (02/05/2010)   Platelets:  200.0 (02/05/2010)   MCV  83.5 (02/05/2010)   MCHC  31.6 (02/05/2010)   RDW  14.6 (02/05/2010)   PMN:  32.5 (02/05/2010)   Lymphs:  55.9 (02/05/2010)   Monos:  7.9 (02/05/2010)   Eosinophils:  2.7 (02/05/2010)   Basophil:  1.0 (02/05/2010)  Complete Metabolic Panel   Glucose:  90 (02/05/2010)   Sodium:  141 (02/05/2010)   Potassium:  4.1 (02/05/2010)   Chloride:  106 (02/05/2010)   CO2:  27 (02/05/2010)   BUN:  11 (02/05/2010)  Creatinine:  1.0 (02/05/2010)   Albumin:  3.4 (02/05/2010)   Total Protein:  7.8 (02/05/2010)   Calcium:  9.0 (02/05/2010)   Total Bili:  0.6 (02/05/2010)   Alk Phos:  124 (02/05/2010)   SGPT (ALT):  20 (02/05/2010)   SGOT (AST):  18 (02/05/2010)   Medications Prior to Update: 1)  Nasonex 50 Mcg/act Susp (Mometasone Furoate) .... 2 Puffs Each Nostril Once Daily 2)  Tussionex Pennkinetic Er 8-10 Mg/72ml Lqcr (Chlorpheniramine-Hydrocodone) .... 5 Ml By Mouth Two Times A Day As Needed For Cough  Current Medications (verified): 1)  Nasonex 50 Mcg/act Susp (Mometasone Furoate) .... 2 Puffs Each Nostril Once Daily  Allergies (verified): No Known Drug Allergies  Past History:  Past Medical History: Reviewed history from 01/26/2010 and no changes required. Allergic rhinitis Asthma Headache  Past Surgical History: Reviewed history from 01/26/2010 and no changes required. Tubal ligation  Family History: Reviewed history from 01/26/2010 and no changes required. Family History of Arthritis Family History Diabetes 1st degree relative Family History Hypertension  Social History: Reviewed history from 01/26/2010 and no changes required. Married Unemployed Never Smoked Alcohol use-no Drug use-no Regular exercise-no  Review of Systems  The patient denies anorexia, fever, weight loss, hoarseness, chest pain, syncope, dyspnea on exertion, peripheral edema, prolonged cough, headaches, hemoptysis, abdominal pain, hematuria, suspicious skin lesions, enlarged lymph nodes, and angioedema.   ENT:  Complains of nasal congestion; denies decreased hearing, difficulty swallowing, earache, hoarseness, nosebleeds, postnasal drainage, ringing in ears, sinus pressure, and sore throat.  Physical Exam  General:  alert, well-developed, well-nourished, well-hydrated, appropriate dress, normal appearance, healthy-appearing, and cooperative to examination.   Head:  normocephalic, atraumatic, no  abnormalities observed, and no abnormalities palpated.   Mouth:  Oral mucosa and oropharynx without lesions or exudates.  Teeth in good repair. Neck:  No deformities, masses, or tenderness noted. Lungs:  Normal respiratory effort, chest expands symmetrically. Lungs are clear to auscultation, no crackles or wheezes. Heart:  Normal rate and regular rhythm. S1 and S2 normal without gallop, murmur, click, rub or other extra sounds. Abdomen:  Bowel sounds positive,abdomen soft and non-tender without masses, organomegaly or hernias noted. Msk:  No deformity or scoliosis noted of thoracic or lumbar spine.   Pulses:  R and L carotid,radial,femoral,dorsalis pedis and posterior tibial pulses are full and equal bilaterally Extremities:  No clubbing, cyanosis, edema, or deformity noted with normal full range of motion of all joints.   Neurologic:  No cranial nerve deficits noted. Station and gait are normal. Plantar reflexes are down-going bilaterally. DTRs are symmetrical throughout. Sensory, motor and coordinative functions appear intact. Skin:  Intact without suspicious lesions or rashes Cervical Nodes:  no anterior cervical adenopathy and no posterior cervical adenopathy.   Axillary Nodes:  no R axillary adenopathy and no L axillary adenopathy.   Psych:  Cognition and judgment appear intact. Alert and cooperative with normal attention span and concentration. No apparent delusions, illusions, hallucinations   Impression & Recommendations:  Problem # 1:  ELECTROCARDIOGRAM, ABNORMAL (ICD-794.31) Assessment Unchanged her myoview was neg. for ischemia or scar.  Problem # 2:  CHEST PAIN, ATYPICAL (ICD-786.59) Assessment: Improved  Problem # 3:  ALLERGIC RHINITIS (ICD-477.9) Assessment: Improved  Her updated medication list for this problem includes:    Nasonex 50 Mcg/act Susp (Mometasone furoate) .Marland Kitchen... 2 puffs each nostril once daily  Complete Medication List: 1)  Nasonex 50 Mcg/act Susp  (Mometasone furoate) .... 2 puffs each nostril once daily  Patient Instructions: 1)  Please schedule a follow-up appointment in 3 months. 2)  It is important that you exercise regularly at least 20 minutes 5 times a week. If you develop chest pain, have severe difficulty breathing, or feel very tired , stop exercising immediately and seek medical attention. 3)  You need to lose weight. Consider a lower calorie diet and regular exercise.

## 2010-12-10 NOTE — Progress Notes (Signed)
Summary: phone call  Phone Note Call from Patient   Summary of Call: Received fax from paging company dated 11/21/2010 at 2:29pm. Fax states "that pt just passed a blood clot when using the bathroom and when she used it a second time it wasnt urine, it was blood." I did not see anything else in chart from call a nurse etc.....Marland KitchenMarland KitchenAlvy Beal Archie CMA  November 23, 2010 9:28 AM   Returned call to home phone and number was busy, will try again later.Marland KitchenMarland KitchenAlvy Beal Archie CMA  November 23, 2010 9:29 AM   Follow-up for Phone Call        no answer.....................Marland KitchenLamar Sprinkles, CMA  November 23, 2010 5:52 PM   no answer...................Marland KitchenLamar Sprinkles, CMA  November 27, 2010 4:26 PM

## 2010-12-15 ENCOUNTER — Telehealth (INDEPENDENT_AMBULATORY_CARE_PROVIDER_SITE_OTHER): Payer: Self-pay | Admitting: *Deleted

## 2010-12-15 ENCOUNTER — Encounter: Payer: Self-pay | Admitting: Internal Medicine

## 2010-12-15 ENCOUNTER — Ambulatory Visit (INDEPENDENT_AMBULATORY_CARE_PROVIDER_SITE_OTHER): Payer: 59 | Admitting: Internal Medicine

## 2010-12-15 DIAGNOSIS — L03019 Cellulitis of unspecified finger: Secondary | ICD-10-CM

## 2010-12-16 ENCOUNTER — Telehealth: Payer: Self-pay | Admitting: Internal Medicine

## 2010-12-18 ENCOUNTER — Encounter: Payer: Self-pay | Admitting: Internal Medicine

## 2010-12-18 ENCOUNTER — Telehealth: Payer: Self-pay | Admitting: Internal Medicine

## 2010-12-18 ENCOUNTER — Ambulatory Visit (INDEPENDENT_AMBULATORY_CARE_PROVIDER_SITE_OTHER): Payer: 59 | Admitting: Internal Medicine

## 2010-12-18 DIAGNOSIS — L03019 Cellulitis of unspecified finger: Secondary | ICD-10-CM

## 2010-12-24 NOTE — Assessment & Plan Note (Signed)
Summary: ?infection hand/per flag [name] pt)   Vital Signs:  Patient profile:   50 year old female Menstrual status:  irregular Height:      61.5 inches Weight:      224 pounds BMI:     41.79 O2 Sat:      97 % on Room air Temp:     97.7 degrees F oral Pulse rate:   75 / minute BP sitting:   118 / 80  (left arm) Cuff size:   large  Vitals Entered By: Bill Salinas CMA (December 15, 2010 4:59 PM)  O2 Flow:  Room air CC: pt here for evaluation of infected left index finger/ ab   Primary Care Provider:  Etta Grandchild MD  CC:  pt here for evaluation of infected left index finger/ ab.  History of Present Illness: [name] presents for a sore left index finger. She thinks she cut it on a box. It has been sore and just a little swollen for two days. She has done nothing for treatment. She has had no fever or chills. There has been no drainage from the finger.   Current Medications (verified): 1)  Nasonex 50 Mcg/act Susp (Mometasone Furoate) .... 2 Puffs Each Nostril Once Daily 2)  Desonide 0.05 % Lotn (Desonide) .... Apply To Aa Two Times A Day As Directed  Allergies (verified): No Known Drug Allergies  Past History:  Past Medical History: Last updated: 01/26/2010 Allergic rhinitis Asthma Headache  Past Surgical History: Last updated: 01/26/2010 Tubal ligation FH reviewed for relevance, SH/Risk Factors reviewed for relevance  Review of Systems  The patient denies anorexia, fever, weight loss, chest pain, dyspnea on exertion, prolonged cough, abdominal pain, muscle weakness, and enlarged lymph nodes.    Physical Exam  General:  overweight AA woman in no acute distress Head:  normocephalic and atraumatic.   Lungs:  normal respiratory effort and normal breath sounds.   Heart:  normal rate and regular rhythm.   Msk:  normal ROM.  Fingfer without deformity. Normal range of motion left index finger. No heberdeens or bouchards nodes. Normal capillary refill Pulses:  2+  radial pulse Skin:  tip of index finger left is a little swollen around the nail bed. No exudate, no pointing.  Psych:  Oriented X3 and good eye contact.     Impression & Recommendations:  Problem # 1:  PARONYCHIA, FINGER (ICD-681.02)  Mild infection  with no indication for I&D  Plan - warm soaks with betadiene two times a day           doxycycline 100mg  two times a day x 7  Her updated medication list for this problem includes:    Doxycycline Hyclate 100 Mg Caps (Doxycycline hyclate) .Marland Kitchen... 1 by mouth two times a day for paronychia left finer  Complete Medication List: 1)  Nasonex 50 Mcg/act Susp (Mometasone furoate) .... 2 puffs each nostril once daily 2)  Desonide 0.05 % Lotn (Desonide) .... Apply to aa two times a day as directed 3)  Doxycycline Hyclate 100 Mg Caps (Doxycycline hyclate) .Marland Kitchen.. 1 by mouth two times a day for paronychia left finer Prescriptions: DOXYCYCLINE HYCLATE 100 MG CAPS (DOXYCYCLINE HYCLATE) 1 by mouth two times a day for paronychia left finer  #14 x 0   Entered and Authorized by:   Jacques Navy MD   Signed by:   Jacques Navy MD on 12/15/2010   Method used:   Electronically to  [facility] [address] [phone]* (retail)       [address]       [place], [place]  [address]       Ph: [phone]       Fax: [phone]   RxID:   [id number]    Orders Added: 1)  Est. Patient Level III [24401]

## 2010-12-24 NOTE — Progress Notes (Signed)
Summary: NEEDS OV   Phone Note Call from Patient Call back at [phone]   Action Taken: Patient advised to go to ER Summary of Call: Husband left vm - pt has infection in her hand and needs to see MD asap. I agree - pt needs office visit. Can you please help schedule w/any avail MD today??  THANKS Initial call taken by: Lamar Sprinkles, CMA,  December 15, 2010 11:47 AM  Follow-up for Phone Call        appt w.Dr Norins at 4pm Follow-up by: Verdell Face,  December 15, 2010 12:13 PM

## 2010-12-24 NOTE — Progress Notes (Signed)
Summary: WORK IN?   Phone Note Call from Patient   Summary of Call: Pt walked in the office - Finger is worse, cannot move it - ok to work in?  Initial call taken by: Lamar Sprinkles, CMA,  December 18, 2010 4:08 PM  Follow-up for Phone Call        Pt seen today Follow-up by: Lamar Sprinkles, CMA,  December 18, 2010 5:41 PM

## 2010-12-24 NOTE — Progress Notes (Signed)
Summary: REQ FOR RX   Phone Note Call from Patient Call back at [phone]   Summary of Call: Patient is requesting rx for pain.  Initial call taken by: Lamar Sprinkles, CMA,  December 16, 2010 3:37 PM  Follow-up for Phone Call        not sure what to do with this, I have not seen her in nearly a year then out of the blue get this vague request, I pass Follow-up by: Etta Grandchild MD,  December 16, 2010 3:42 PM  Additional Follow-up for Phone Call Additional follow up Details #1::        I'm sorry - pt was seen by Dr Debby Bud. Please advise. Additional Follow-up by: Lamar Sprinkles, CMA,  December 16, 2010 4:13 PM    Additional Follow-up for Phone Call Additional follow up Details #2::    Very minor paronychia - she had been instructed to take APAP or ibuprofen. If this has not worked she can have tramadol 50mg  1 every 6 hrs, #15 Follow-up by: Jacques Navy MD,  December 16, 2010 4:27 PM  Additional Follow-up for Phone Call Additional follow up Details #3:: Details for Additional Follow-up Action Taken: Pt informed - otc meds have not helped Additional Follow-up by: Lamar Sprinkles, CMA,  December 16, 2010 4:46 PM  New/Updated Medications: TRAMADOL HCL 50 MG TABS (TRAMADOL HCL) 1 every 6 hours as needed pain Prescriptions: TRAMADOL HCL 50 MG TABS (TRAMADOL HCL) 1 every 6 hours as needed pain  #15 x 0   Entered by:   Lamar Sprinkles, CMA   Authorized by:   Jacques Navy MD   Signed by:   Lamar Sprinkles, CMA on 12/16/2010   Method used:   Electronically to        [facility] [phone]* (retail)       [address]       [place], [place]  [address]       Ph: [phone]       Fax: [phone]   RxID:   [id number]

## 2010-12-30 NOTE — Assessment & Plan Note (Signed)
Summary: CAN'T MOVE FINGER/NWS   Vital Signs:  Patient profile:   50 year old female Menstrual status:  irregular Height:      61.5 inches Weight:      221 pounds BMI:     41.23 O2 Sat:      98 % on Room air Temp:     98.5 degrees F oral Pulse rate:   94 / minute BP sitting:   152 / 90  (left arm) Cuff size:   large  Vitals Entered By: Bill Salinas CMA (December 18, 2010 4:36 PM)  O2 Flow:  Room air CC: pts pain in her left index finger is getting worse/ ab   Primary Care Provider:  Etta Grandchild MD  CC:  pts pain in her left index finger is getting worse/ ab.  History of Present Illness: [name] was seen Feb 7th for a mild paronychia left index finger. she was instructed to soak it two times a day. She called back due to pain and was started on doxycyline 100mg  two times a day and tramadol 50mg  three times a day. She returns for progressive pain and now swelling of the finger tip. She reports that pain radiates the length of her finger and involves the palm.   Current Medications (verified): 1)  Nasonex 50 Mcg/act Susp (Mometasone Furoate) .... 2 Puffs Each Nostril Once Daily 2)  Desonide 0.05 % Lotn (Desonide) .... Apply To Aa Two Times A Day As Directed 3)  Doxycycline Hyclate 100 Mg Caps (Doxycycline Hyclate) .Marland Kitchen.. 1 By Mouth Two Times A Day For Paronychia Left Finer 4)  Tramadol Hcl 50 Mg Tabs (Tramadol Hcl) .Marland Kitchen.. 1 Every 6 Hours As Needed Pain  Allergies (verified): No Known Drug Allergies PMH-FH-SH reviewed-no changes except otherwise noted  Review of Systems Derm:  Complains of changes in color of skin and changes in nail beds; denies dryness, flushing, itching, and poor wound healing.  Physical Exam  General:  Well-developed,well-nourished,in no acute distress; alert,appropriate and cooperative throughout examination Msk:  index finger left hand with no joint deformity. She has normal range of motion at the DIP and PIP joints. Nl flexion at the MTP joint and  nl movement of the hand.  Pulses:  2+ radial Skin:  swelling at the medial aspect distal left index finger with progressive paronychia.   Impression & Recommendations:  Problem # 1:  PARONYCHIA, FINGER (ICD-681.02)  Progressive infection that required I&D.  Plan - bandaid with neosporin          complete doxycycline           use tramadol as needed.  Her updated medication list for this problem includes:    Doxycycline Hyclate 100 Mg Caps (Doxycycline hyclate) .Marland Kitchen... 1 by mouth two times a day for paronychia left finer  Orders: I&D Abscess, Simple / Single (10060)  Complete Medication List: 1)  Nasonex 50 Mcg/act Susp (Mometasone furoate) .... 2 puffs each nostril once daily 2)  Desonide 0.05 % Lotn (Desonide) .... Apply to aa two times a day as directed 3)  Doxycycline Hyclate 100 Mg Caps (Doxycycline hyclate) .Marland Kitchen.. 1 by mouth two times a day for paronychia left finer 4)  Tramadol Hcl 50 Mg Tabs (Tramadol hcl) .Marland Kitchen.. 1 every 6 hours as needed pain   Orders Added: 1)  Est. Patient Level II [40981] 2)  I&D Abscess, Simple / Single [10060]     Procedure Note  Incision & Drainage: The patient complains of pain, redness, inflammation, tenderness,  and swelling. Indication: infected lesion  Procedure # 1: I & D    Location: medial tip left index finger    Comment: progressive paronychia. Patient agreed to minor I&D. Finger prepped with betadiene. Sprayed with ethyline choride spray. quickly incised 0.2 cm incision - expressed purulent material.    Instrument used: #11 blade    Anesthesia: topical freeze spray  Cleaned and prepped with: betadine Wound dressing: bandaid Additional Instructions: routine wound precautions.

## 2011-01-12 ENCOUNTER — Other Ambulatory Visit (HOSPITAL_COMMUNITY)
Admission: RE | Admit: 2011-01-12 | Discharge: 2011-01-12 | Disposition: A | Discharge status: Home / Self Care | Payer: 59 | Source: Ambulatory Visit | Attending: Internal Medicine | Admitting: Internal Medicine

## 2011-01-12 ENCOUNTER — Other Ambulatory Visit: Payer: Self-pay | Admitting: Internal Medicine

## 2011-01-12 ENCOUNTER — Encounter: Payer: Self-pay | Admitting: Internal Medicine

## 2011-01-12 ENCOUNTER — Encounter (INDEPENDENT_AMBULATORY_CARE_PROVIDER_SITE_OTHER): Payer: 59 | Admitting: Internal Medicine

## 2011-01-12 ENCOUNTER — Other Ambulatory Visit: Payer: 59

## 2011-01-12 DIAGNOSIS — D649 Anemia, unspecified: Secondary | ICD-10-CM | POA: Insufficient documentation

## 2011-01-12 DIAGNOSIS — Z01419 Encounter for gynecological examination (general) (routine) without abnormal findings: Secondary | ICD-10-CM | POA: Insufficient documentation

## 2011-01-12 DIAGNOSIS — R0789 Other chest pain: Secondary | ICD-10-CM

## 2011-01-12 DIAGNOSIS — Z0000 Encounter for general adult medical examination without abnormal findings: Secondary | ICD-10-CM

## 2011-01-12 DIAGNOSIS — Z1231 Encounter for screening mammogram for malignant neoplasm of breast: Secondary | ICD-10-CM

## 2011-01-12 LAB — BASIC METABOLIC PANEL
CO2: 29 mEq/L (ref 19–32)
Calcium: 9.1 mg/dL (ref 8.4–10.5)
Chloride: 105 mEq/L (ref 96–112)
Creatinine, Ser: 1 mg/dL (ref 0.4–1.2)
Glucose, Bld: 90 mg/dL (ref 70–99)

## 2011-01-12 LAB — CBC WITH DIFFERENTIAL/PLATELET
Basophils Absolute: 0 10*3/uL (ref 0.0–0.1)
Basophils Relative: 0.8 % (ref 0.0–3.0)
Eosinophils Absolute: 0.1 10*3/uL (ref 0.0–0.7)
Hemoglobin: 12.8 g/dL (ref 12.0–15.0)
Lymphocytes Relative: 47.8 % — ABNORMAL HIGH (ref 12.0–46.0)
MCHC: 32.9 g/dL (ref 30.0–36.0)
MCV: 82.8 fl (ref 78.0–100.0)
Monocytes Absolute: 0.4 10*3/uL (ref 0.1–1.0)
Neutro Abs: 1.9 10*3/uL (ref 1.4–7.7)
RBC: 4.69 Mil/uL (ref 3.87–5.11)
RDW: 15.8 % — ABNORMAL HIGH (ref 11.5–14.6)

## 2011-01-12 LAB — HEPATIC FUNCTION PANEL
Albumin: 3.5 g/dL (ref 3.5–5.2)
Total Protein: 7.1 g/dL (ref 6.0–8.3)

## 2011-01-12 LAB — LIPID PANEL
HDL: 41 mg/dL (ref >39.00)
LDL Cholesterol: 125 mg/dL — ABNORMAL HIGH (ref 0–99)
VLDL: 20.4 mg/dL (ref 0.0–40.0)

## 2011-01-12 LAB — B12 AND FOLATE PANEL
Folate: 5.9 ng/mL — ABNORMAL LOW (ref >5.9)
Vitamin B-12: 229 pg/mL (ref 211–911)

## 2011-01-12 LAB — TSH: TSH: 2.23 u[IU]/mL (ref 0.35–5.50)

## 2011-01-12 LAB — IBC PANEL: Saturation Ratios: 14.7 % — ABNORMAL LOW (ref 20.0–50.0)

## 2011-01-13 ENCOUNTER — Encounter: Payer: Self-pay | Admitting: Internal Medicine

## 2011-01-19 NOTE — Assessment & Plan Note (Signed)
Summary: cpx--pt wants a full physcial, she states she has not had one...   Vital Signs:  Patient profile:   50 year old female Menstrual status:  irregular LMP:     12/29/2010 Height:      61.5 inches Weight:      223.25 pounds O2 Sat:      97 % on Room air Temp:     98.1 degrees F oral Pulse rate:   61 / minute Pulse rhythm:   regular Resp:     16 per minute BP sitting:   114 / 78  (left arm) Cuff size:   large  Vitals Entered By: Rock Nephew CMA (January 12, 2011 8:51 AM)  O2 Flow:  Room air CC: CPX w/ lab, Preventive Care LMP (date): 12/29/2010     Enter LMP: 12/29/2010   Primary Care Provider:  Etta Grandchild MD  CC:  CPX w/ lab and Preventive Care.  History of Present Illness:  Follow-Up Visit      This is a 50 year old woman who presents for Follow-up visit.  The patient denies chest pain, palpitations, dizziness, syncope, edema, SOB, DOE, PND, and orthopnea.  Since the last visit the patient notes no new problems or concerns.    Preventive Screening-Counseling & Management  Alcohol-Tobacco     Alcohol drinks/day: 0     Alcohol Counseling: not indicated; patient does not drink     Smoking Status: never     Tobacco Counseling: not indicated; no tobacco use  Hep-HIV-STD-Contraception     Hepatitis Risk: no risk noted     HIV Risk: no risk noted     STD Risk: no risk noted      Sexual History:  currently monogamous.        Drug Use:  no.        Blood Transfusions:  no.    Clinical Review Panels:  Lipid Management   Cholesterol:  163 (02/05/2010)   LDL (bad choesterol):  104 (02/05/2010)   HDL (good cholesterol):  46.50 (02/05/2010)  Diabetes Management   Creatinine:  1.0 (02/05/2010)  CBC   WBC:  4.7 (02/05/2010)   RBC:  4.41 (02/05/2010)   Hgb:  11.6 (02/05/2010)   Hct:  36.8 (02/05/2010)   Platelets:  200.0 (02/05/2010)   MCV  83.5 (02/05/2010)   MCHC  31.6 (02/05/2010)   RDW  14.6 (02/05/2010)   PMN:  32.5 (02/05/2010)   Lymphs:   55.9 (02/05/2010)   Monos:  7.9 (02/05/2010)   Eosinophils:  2.7 (02/05/2010)   Basophil:  1.0 (02/05/2010)  Complete Metabolic Panel   Glucose:  90 (02/05/2010)   Sodium:  141 (02/05/2010)   Potassium:  4.1 (02/05/2010)   Chloride:  106 (02/05/2010)   CO2:  27 (02/05/2010)   BUN:  11 (02/05/2010)   Creatinine:  1.0 (02/05/2010)   Albumin:  3.4 (02/05/2010)   Total Protein:  7.8 (02/05/2010)   Calcium:  9.0 (02/05/2010)   Total Bili:  0.6 (02/05/2010)   Alk Phos:  124 (02/05/2010)   SGPT (ALT):  20 (02/05/2010)   SGOT (AST):  18 (02/05/2010)   Medications Prior to Update: 1)  Nasonex 50 Mcg/act Susp (Mometasone Furoate) .... 2 Puffs Each Nostril Once Daily 2)  Desonide 0.05 % Lotn (Desonide) .... Apply To Aa Two Times A Day As Directed 3)  Doxycycline Hyclate 100 Mg Caps (Doxycycline Hyclate) .Marland Kitchen.. 1 By Mouth Two Times A Day For Paronychia Left Finer 4)  Tramadol Hcl 50  Mg Tabs (Tramadol Hcl) .Marland Kitchen.. 1 Every 6 Hours As Needed Pain  Current Medications (verified): 1)  Nasonex 50 Mcg/act Susp (Mometasone Furoate) .... 2 Puffs Each Nostril Once Daily 2)  Desonide 0.05 % Lotn (Desonide) .... Apply To Aa Two Times A Day As Directed 3)  Doxycycline Hyclate 100 Mg Caps (Doxycycline Hyclate) .Marland Kitchen.. 1 By Mouth Two Times A Day For Paronychia Left Finer 4)  Tramadol Hcl 50 Mg Tabs (Tramadol Hcl) .Marland Kitchen.. 1 Every 6 Hours As Needed Pain  Allergies (verified): No Known Drug Allergies  Past History:  Past Surgical History: Last updated: 01/26/2010 Tubal ligation  Family History: Last updated: 01/26/2010 Family History of Arthritis Family History Diabetes 1st degree relative Family History Hypertension  Social History: Last updated: 01/26/2010 Married Unemployed Never Smoked Alcohol use-no Drug use-no Regular exercise-no  Risk Factors: Alcohol Use: 0 (01/12/2011) Exercise: no (01/26/2010)  Risk Factors: Smoking Status: never (01/12/2011)  Past Medical History: Allergic  rhinitis Asthma Headache Anemia-NOS  Family History: Reviewed history from 01/26/2010 and no changes required. Family History of Arthritis Family History Diabetes 1st degree relative Family History Hypertension  Social History: Reviewed history from 01/26/2010 and no changes required. Married Unemployed Never Smoked Alcohol use-no Drug use-no Regular exercise-no  Review of Systems       The patient complains of weight gain.  The patient denies anorexia, fever, weight loss, chest pain, syncope, dyspnea on exertion, peripheral edema, prolonged cough, headaches, hemoptysis, abdominal pain, melena, hematochezia, severe indigestion/heartburn, hematuria, incontinence, genital sores, muscle weakness, suspicious skin lesions, transient blindness, difficulty walking, depression, unusual weight change, abnormal bleeding, enlarged lymph nodes, angioedema, and breast masses.   Heme:  Denies abnormal bruising, bleeding, enlarge lymph nodes, fevers, pallor, and skin discoloration.  Physical Exam  General:  Well-developed,well-nourished,in no acute distress; alert,appropriate and cooperative throughout examination Head:  normocephalic and atraumatic.   Eyes:  vision grossly intact, pupils equal, pupils round, and pupils reactive to light.   Ears:  R ear normal and L ear normal.   Mouth:  Oral mucosa and oropharynx without lesions or exudates.  Teeth in good repair. Neck:  No deformities, masses, or tenderness noted. Chest Wall:  No deformities, masses, or tenderness noted. Breasts:  No mass, nodules, thickening, tenderness, bulging, retraction, inflamation, nipple discharge or skin changes noted.   Lungs:  normal respiratory effort and normal breath sounds.   Heart:  normal rate and regular rhythm.   Abdomen:  Bowel sounds positive,abdomen soft and non-tender without masses, organomegaly or hernias noted. Rectal:  No external abnormalities noted. Normal sphincter tone. No rectal masses or  tenderness. Genitalia:  Normal introitus for age, no external lesions, no vaginal discharge, mucosa pink and moist, no vaginal or cervical lesions, no vaginal atrophy, no friaility or hemorrhage, normal uterus size and position, no adnexal masses or tenderness Msk:  No deformity or scoliosis noted of thoracic or lumbar spine.   Pulses:  R and L carotid,radial,femoral,dorsalis pedis and posterior tibial pulses are full and equal bilaterally Extremities:  No clubbing, cyanosis, edema, or deformity noted with normal full range of motion of all joints.   Neurologic:  No cranial nerve deficits noted. Station and gait are normal. Plantar reflexes are down-going bilaterally. DTRs are symmetrical throughout. Sensory, motor and coordinative functions appear intact. Skin:  Intact without suspicious lesions or rashes Cervical Nodes:  No lymphadenopathy noted Axillary Nodes:  No palpable lymphadenopathy Inguinal Nodes:  No significant adenopathy Psych:  Cognition and judgment appear intact. Alert and cooperative with normal attention span  and concentration. No apparent delusions, illusions, hallucinations   Impression & Recommendations:  Problem # 1:  ROUTINE GENERAL MEDICAL EXAM@HEALTH  CARE FACL (ICD-V70.0) Assessment New  Orders: Venipuncture (40981) Radiology Referral (Radiology) TLB-Lipid Panel (80061-LIPID) TLB-B12 + Folate Pnl (19147_82956-O13/YQM) TLB-IBC Pnl (Iron/FE;Transferrin) (83550-IBC) TLB-BMP (Basic Metabolic Panel-BMET) (80048-METABOL) TLB-CBC Platelet - w/Differential (85025-CBCD) TLB-Hepatic/Liver Function Pnl (80076-HEPATIC) TLB-TSH (Thyroid Stimulating Hormone) (84443-TSH) Hemoccult Guaiac-1 spec.(in office) (82270)  Chol: 163 (02/05/2010)   HDL: 46.50 (02/05/2010)   LDL: 104 (02/05/2010)   TG: 63.0 (02/05/2010) TSH: 1.06 (02/05/2010)    Discussed using sunscreen, use of alcohol, drug use, self breast exam, routine dental care, routine eye care, schedule for GYN exam, routine  physical exam, seat belts, multiple vitamins, osteoporosis prevention, adequate calcium intake in diet, recommendations for immunizations, mammograms and Pap smears.  Discussed exercise and checking cholesterol.  Discussed gun safety, safe sex, and contraception.  Problem # 2:  ANEMIA-NOS (ICD-285.9) Assessment: New  Orders: Venipuncture (57846) TLB-Lipid Panel (80061-LIPID) TLB-B12 + Folate Pnl (96295_28413-K44/WNU) TLB-IBC Pnl (Iron/FE;Transferrin) (83550-IBC) TLB-BMP (Basic Metabolic Panel-BMET) (80048-METABOL) TLB-CBC Platelet - w/Differential (85025-CBCD) TLB-Hepatic/Liver Function Pnl (80076-HEPATIC) TLB-TSH (Thyroid Stimulating Hormone) (84443-TSH) Hemoccult Guaiac-1 spec.(in office) (82270)  Hgb: 11.6 (02/05/2010)   Hct: 36.8 (02/05/2010)   Platelets: 200.0 (02/05/2010) RBC: 4.41 (02/05/2010)   RDW: 14.6 (02/05/2010)   WBC: 4.7 (02/05/2010) MCV: 83.5 (02/05/2010)   MCHC: 31.6 (02/05/2010) TSH: 1.06 (02/05/2010)  Problem # 3:  CHEST PAIN, ATYPICAL (ICD-786.59) Assessment: Improved  Complete Medication List: 1)  Nasonex 50 Mcg/act Susp (Mometasone furoate) .... 2 puffs each nostril once daily 2)  Desonide 0.05 % Lotn (Desonide) .... Apply to aa two times a day as directed 3)  Doxycycline Hyclate 100 Mg Caps (Doxycycline hyclate) .Marland Kitchen.. 1 by mouth two times a day for paronychia left finer 4)  Tramadol Hcl 50 Mg Tabs (Tramadol hcl) .Marland Kitchen.. 1 every 6 hours as needed pain  Other Orders: Specimen Handling (27253)  Colorectal Screening:  Current Recommendations:    Hemoccult: NEG X 1 today    Colonoscopy recommended: patient defers today but will consider in the future  PAP Screening:    Reviewed PAP smear recommendations:  PAP smear done  Mammogram Screening:    Reviewed Mammogram recommendations:  mammogram ordered  Osteoporosis Risk Assessment:  Risk Factors for Fracture or Low Bone Density:   Smoking status:       never  Patient Instructions: 1)  Please schedule a  follow-up appointment in 2 months. 2)  It is important that you exercise regularly at least 20 minutes 5 times a week. If you develop chest pain, have severe difficulty breathing, or feel very tired , stop exercising immediately and seek medical attention. 3)  You need to lose weight. Consider a lower calorie diet and regular exercise.  4)  Schedule your mammogram. 5)  You need to have a Pap Smear to prevent cervical cancer. 6)  If you could be exposed to sexually transmitted diseases, you should use a condom. 7)  If you are having sex and you or your partner don't want a child, use contraception. Prescriptions: DESONIDE 0.05 % LOTN (DESONIDE) Apply to AA two times a day as directed  #59 [place] x 11   Entered and Authorized by:   Etta Grandchild MD   Signed by:   Etta Grandchild MD on 01/12/2011   Method used:   Electronically to        [facility] [phone]* (retail)       [address]       [place], [place]  [address]       Ph: [phone]       Fax: [phone]   RxID:   [id number]    Orders Added: 1)  Venipuncture [88416] 2)  Specimen Handling [99000] 3)  Radiology Referral [Radiology] 4)  TLB-Lipid Panel [80061-LIPID] 5)  TLB-B12 + Folate Pnl [82746_82607-B12/FOL] 6)  TLB-IBC Pnl (Iron/FE;Transferrin) [83550-IBC] 7)  TLB-BMP (Basic Metabolic Panel-BMET) [80048-METABOL] 8)  TLB-CBC Platelet - w/Differential [85025-CBCD] 9)  TLB-Hepatic/Liver Function Pnl [80076-HEPATIC] 10)  TLB-TSH (Thyroid Stimulating Hormone) [84443-TSH] 11)  Hemoccult Guaiac-1 spec.(in office) [82270] 12)  Est. Patient 40-64 years [99396] 13)  Est. Patient Level III [60630]

## 2011-01-19 NOTE — Letter (Signed)
Summary: Results Follow-up Letter  [facility]  [address] [place], [place] [address]   Phone: [phone]  Fax: [phone]    01/13/2011  [address] [place], [place]  [address]  [place]  Dear Ms. Eiben,   The following are the results of your recent test(s):  Test     Result     Pap Smear    Normal__XXX_____  Not Normal_____       Comments: _________________________________________________________ Cholesterol LDL(Bad cholesterol):          Your goal is less than:         HDL (Good cholesterol):        Your goal is more than: _________________________________________________________ Other Tests:   _________________________________________________________  Please call for an appointment Or _________________________________________________________ _________________________________________________________ _________________________________________________________  Sincerely,  Sanda Linger MD [facility]

## 2011-01-19 NOTE — Letter (Signed)
Summary: Lipid Letter  [facility]  [address] [place], [place] [address]   Phone: [phone]  Fax: [phone]    01/12/2011  [name] [address] [place], [place]  [address]  Dear [name]:  We have carefully reviewed your last lipid profile from 01/12/2011 and the results are noted below with a summary of recommendations for lipid management.    Cholesterol:       186     Goal: <200   HDL "good" Cholesterol:   13.24     Goal: >40   LDL "bad" Cholesterol:   125     Goal: <130   Triglycerides:       102.0     Goal: <150    YOUR FOLATE LEVEL IS LOW, PLEASE FOLLOW-UP SOON!    TLC Diet (Therapeutic Lifestyle Change): Saturated Fats & Transfatty acids should be kept < 7% of total calories ***Reduce Saturated Fats Polyunstaurated Fat can be up to 10% of total calories Monounsaturated Fat Fat can be up to 20% of total calories Total Fat should be no greater than 25-35% of total calories Carbohydrates should be 50-60% of total calories Protein should be approximately 15% of total calories Fiber should be at least 20-30 grams a day ***Increased fiber may help lower LDL Total Cholesterol should be < 200mg /day Consider adding plant stanol/sterols to diet (example: Benacol spread) ***A higher intake of unsaturated fat may reduce Triglycerides and Increase HDL    Adjunctive Measures (may lower LIPIDS and reduce risk of Heart Attack) include: Aerobic Exercise (20-30 minutes 3-4 times a week) Limit Alcohol Consumption Weight Reduction Aspirin 75-81 mg a day by mouth (if not allergic or contraindicated) Dietary Fiber 20-30 grams a day by mouth     Current Medications: 1)    Nasonex 50 Mcg/act Susp (Mometasone furoate) .... 2 puffs each nostril once daily 2)    Desonide 0.05 % Lotn (Desonide) .... Apply to aa two times a day as directed 3)    Doxycycline Hyclate 100 Mg Caps (Doxycycline hyclate) .Marland Kitchen.. 1 by mouth two times a day for paronychia left finer 4)     Tramadol Hcl 50 Mg Tabs (Tramadol hcl) .Marland Kitchen.. 1 every 6 hours as needed pain  If you have any questions, please call. We appreciate being able to work with you.   Sincerely,     [facility] Crystal Grandchild MD

## 2011-01-27 ENCOUNTER — Ambulatory Visit
Admission: RE | Admit: 2011-01-27 | Discharge: 2011-01-27 | Disposition: A | Discharge status: Home / Self Care | Payer: 59 | Source: Ambulatory Visit | Attending: Internal Medicine | Admitting: Internal Medicine

## 2011-01-27 DIAGNOSIS — Z1231 Encounter for screening mammogram for malignant neoplasm of breast: Secondary | ICD-10-CM

## 2011-02-11 LAB — CULTURE, ROUTINE-ABSCESS

## 2011-02-11 LAB — GRAM STAIN: Gram Stain: NONE SEEN

## 2011-02-15 LAB — BASIC METABOLIC PANEL
BUN: 9 mg/dL (ref 6–23)
Calcium: 9 mg/dL (ref 8.4–10.5)
Creatinine, Ser: 0.93 mg/dL (ref 0.4–1.2)
GFR calc non Af Amer: >60 mL/min (ref >60)
Glucose, Bld: 103 mg/dL — ABNORMAL HIGH (ref 70–99)
Sodium: 139 mEq/L (ref 135–145)

## 2011-02-15 LAB — CBC
Hemoglobin: 12.4 g/dL (ref 12.0–15.0)
Platelets: 242 10*3/uL (ref 150–400)
RDW: 16 % — ABNORMAL HIGH (ref 11.5–15.5)

## 2011-02-15 LAB — GC/CHLAMYDIA PROBE AMP, GENITAL
Chlamydia, DNA Probe: NEGATIVE
GC Probe Amp, Genital: NEGATIVE

## 2011-02-15 LAB — URINALYSIS, ROUTINE W REFLEX MICROSCOPIC
Bilirubin Urine: NEGATIVE
Ketones, ur: NEGATIVE mg/dL
Leukocytes, UA: NEGATIVE
Nitrite: NEGATIVE
Protein, ur: NEGATIVE mg/dL
Urobilinogen, UA: 1 mg/dL (ref 0.0–1.0)

## 2011-02-15 LAB — POCT PREGNANCY, URINE: Preg Test, Ur: NEGATIVE

## 2011-03-16 ENCOUNTER — Encounter: Payer: Self-pay | Admitting: Internal Medicine

## 2011-03-18 ENCOUNTER — Encounter: Payer: Self-pay | Admitting: Internal Medicine

## 2011-03-18 ENCOUNTER — Ambulatory Visit (INDEPENDENT_AMBULATORY_CARE_PROVIDER_SITE_OTHER): Payer: 59 | Admitting: Internal Medicine

## 2011-03-18 VITALS — BP 116/80 | HR 60 | Temp 98.0°F | Resp 16 | Wt 225.0 lb

## 2011-03-18 DIAGNOSIS — J309 Allergic rhinitis, unspecified: Secondary | ICD-10-CM

## 2011-03-18 DIAGNOSIS — H1010 Acute atopic conjunctivitis, unspecified eye: Secondary | ICD-10-CM | POA: Insufficient documentation

## 2011-03-18 DIAGNOSIS — H1045 Other chronic allergic conjunctivitis: Secondary | ICD-10-CM

## 2011-03-18 MED ORDER — OLOPATADINE HCL 0.2 % OP SOLN: 1.0000 [drp] | Freq: Three times a day (TID) | OPHTHALMIC | Status: DC | PRN

## 2011-03-18 NOTE — Patient Instructions (Signed)
Allergic Conjunctivitis The conjunctiva is a thin membrane that covers the visible white part of the eyeball and the underside of the eyelids. This membrane protects and lubricates the eye. The membrane has small blood vessels running through it that can normally be seen. When the conjunctiva becomes inflamed, the condition is called conjunctivitis. In response to the inflammation, the conjunctival blood vessels become swollen. The swelling results in redness in the normally white part of the eye. The blood vessels of this membrane also react when a person has allergies and is then called allergic conjunctivitis. This condition usually lasts for as long as the allergy persists. Allergic conjunctivitis cannot be passed to another person (non-contagious). The likelihood of bacterial infection is great and the cause is not likely due to allergies if the inflamed eye has:  A sticky discharge.  Discharge or sticking together of the lids in the morning.   Scaling or flaking of the eyelids where the eyelashes come out.   Red swollen eyelids.   CAUSES  Germs (bacteria).   Viruses.   Irritants such as foreign bodies.   Blunt injury.   Chemicals.   General allergic reactions.   Inflammation or serious diseases in the inside or the outside of the eye or the orbit (the boney cavity in which the eye sits) can cause a "red eye."  SYMPTOMS  Eye redness.   Tearing.   Itchy eyes.   Burning feeling in the eyes.   Clear drainage from the eye.   Allergic reaction due to pollens or ragweed sensitivity. Seasonal allergic conjunctivitis is frequent in the spring when pollens are in the air and in the fall.  DIAGNOSIS This condition, in its many forms, is usually diagnosed based on the history and an ophthalmological exam. It usually involves both eyes. If your eyes react at the same time every year, allergies may be the cause. While most "red eyes" are due to allergy or an infection, the role of an  eye (ophthalmological) exam is important. The exam can rule out serious diseases of the eye or orbit. TREATMENT  Non-antibiotic eye drops, ointments, or medications by mouth may be prescribed if the ophthalmologist is sure the conjunctivitis is due to allergies alone.   Over-the-counter drops and ointments for allergic symptoms should be used only after other causes of conjunctivitis have been ruled out, or as your caregiver suggests.  Medications by mouth are often prescribed if other allergy-related symptoms are present. If the ophthalmologist is sure that the conjunctivitis is due to allergies alone, treatment is normally limited to drops or ointments to reduce itching and burning. HOME CARE INSTRUCTIONS  Wash hands before and after applying drops or ointments, or touching the inflamed eye(s) or eyelids.   Stop using your soft contact lenses and throw them away. Use a new pair of lenses when recovery is complete. You should run through sterilizing cycles at least three times before use after complete recovery if the old soft contact lenses are to be used. Hard contact lenses should be stopped. They need to be thoroughly sterilized before use after recovery.   Do not let the eye dropper tip or ointment tube touch the eyelid when putting medicine in your eye.   Itching and burning eyes due to allergies is often relieved by using a cool cloth applied to closed eye(s).  SEEK MEDICAL CARE IF:   Your problems do not go away after two or three days of treatment.   Your lids are sticky (especially in  the morning when you wake up) or stick together.   Discharge develops. Antibiotics may be needed either as drops, ointment, or by mouth.   You have extreme light sensitivity.   An oral temperature above 100.5 develops.   Pain in or around the eye or any other visual symptom develops.  MAKE SURE YOU:   Understand these instructions.   Will watch your condition.   Will get help right away if  you are not doing well or get worse.  Document Released: 01/15/2003 Document Re-Released: 04/14/2010 [facility] Patient Information 2011 [place], [place].

## 2011-03-18 NOTE — Assessment & Plan Note (Signed)
Try pataday and continue zyrtec and continue nasonex

## 2011-03-18 NOTE — Progress Notes (Signed)
Subjective:    Patient ID: [name], female    DOB: [birth year], 50 y.o.   MRN: [id number]  Allergic Reaction This is a new problem. The current episode started 5 to 7 days ago. The problem occurs intermittently. The problem has been gradually worsening since onset. The problem is mild. It is unknown what she was exposed to. The time of exposure is unknown. Associated symptoms include eye itching, eye redness and eye watering. Pertinent negatives include no abdominal pain, chest pain, chest pressure, coughing, diarrhea, difficulty breathing, drooling, globus sensation, hyperventilation, itching, rash, stridor, trouble swallowing, vomiting or wheezing. There is no swelling present. Past treatments include one or more prescription drugs and one or more OTC medications. The treatment provided no relief. Her past medical history is significant for seasonal allergies.      Review of Systems  Constitutional: Negative for fever, chills, diaphoresis, activity change, appetite change, fatigue and unexpected weight change.  HENT: Negative for hearing loss, ear pain, nosebleeds, congestion, sore throat, facial swelling, rhinorrhea, sneezing, drooling, mouth sores, trouble swallowing, neck pain, neck stiffness, dental problem, voice change, postnasal drip, sinus pressure, tinnitus and ear discharge.   Eyes: Positive for redness and itching. Negative for photophobia, pain, discharge and visual disturbance.  Respiratory: Negative for apnea, cough, choking, chest tightness, shortness of breath, wheezing and stridor.   Cardiovascular: Negative for chest pain, palpitations and leg swelling.  Gastrointestinal: Negative for nausea, vomiting, abdominal pain, diarrhea, constipation, blood in stool and abdominal distention.  Genitourinary: Negative for dysuria, urgency, frequency, hematuria, flank pain, decreased urine volume, enuresis, difficulty urinating and dyspareunia.  Musculoskeletal: Negative for myalgias,  back pain, joint swelling, arthralgias and gait problem.  Skin: Negative for color change, itching, pallor and rash.  Neurological: Negative for dizziness, tremors, seizures, syncope, facial asymmetry, speech difficulty, weakness, light-headedness, numbness and headaches.  Hematological: Negative for adenopathy. Does not bruise/bleed easily.  Psychiatric/Behavioral: Negative for suicidal ideas, hallucinations, behavioral problems, confusion, sleep disturbance, self-injury, dysphoric mood, decreased concentration and agitation. The patient is not nervous/anxious and is not hyperactive.        Objective:   Physical Exam  [vitalsreviewed. Constitutional: She is oriented to person, place, and time. She appears well-developed and well-nourished. No distress.  HENT:  Head: Normocephalic and atraumatic.  Right Ear: External ear normal.  Left Ear: External ear normal.  Nose: Nose normal.  Mouth/Throat: Oropharynx is clear and moist.  Eyes: Conjunctivae and EOM are normal. Pupils are equal, round, and reactive to light. Right eye exhibits no discharge. Left eye exhibits no discharge. No scleral icterus.  Neck: Normal range of motion. Neck supple. No JVD present. No tracheal deviation present. No thyromegaly present.  Cardiovascular: Normal rate, regular rhythm, normal heart sounds and intact distal pulses.  Exam reveals no gallop and no friction rub.   No murmur heard. Pulmonary/Chest: Effort normal and breath sounds normal. No stridor. No respiratory distress. She has no wheezes. She has no rales. She exhibits no tenderness.  Abdominal: Soft. Bowel sounds are normal. She exhibits no distension and no mass. There is no tenderness. There is no rebound and no guarding.  Musculoskeletal: Normal range of motion. She exhibits no edema and no tenderness.  Lymphadenopathy:    She has no cervical adenopathy.  Neurological: She is alert and oriented to person, place, and time. She has normal reflexes. No  cranial nerve deficit.  Skin: Skin is warm and dry. No rash noted. She is not diaphoretic. No erythema. No pallor.  Psychiatric: She has  a normal mood and affect. Her behavior is normal. Judgment and thought content normal.         Lab Results  Component Value Date   WBC 4.6 01/12/2011   HGB 12.8 01/12/2011   HCT 38.8 01/12/2011   PLT 257.0 01/12/2011   CHOL 186 01/12/2011   TRIG 102.0 01/12/2011   HDL 41.00 01/12/2011   ALT 15 01/12/2011   AST 12 01/12/2011   NA 138 01/12/2011   K 4.5 01/12/2011   CL 105 01/12/2011   CREATININE 1.0 01/12/2011   BUN 15 01/12/2011   CO2 29 01/12/2011   TSH 2.23 01/12/2011   Assessment & Plan:

## 2011-03-18 NOTE — Assessment & Plan Note (Signed)
Continue the same

## 2011-03-22 ENCOUNTER — Telehealth: Payer: Self-pay | Admitting: *Deleted

## 2011-03-22 DIAGNOSIS — H1010 Acute atopic conjunctivitis, unspecified eye: Secondary | ICD-10-CM

## 2011-03-22 MED ORDER — OLOPATADINE HCL 0.2 % OP SOLN: 1.0000 [drp] | Freq: Every day | OPHTHALMIC | Status: DC

## 2011-03-22 NOTE — Telephone Encounter (Signed)
Pharm has question - RX was sent in for eye drops but sig was tid and per pharm this med is for once daily dosing. Dr Yetta Barre is out of the office, can you help advise on this please?

## 2011-03-22 NOTE — Telephone Encounter (Signed)
Reviewed note: being treated for allergic conjunctivitis - pataday once a day for 10 days. No refills.

## 2011-03-22 NOTE — Telephone Encounter (Signed)
NEW RX Sent in

## 2011-03-26 ENCOUNTER — Telehealth: Payer: Self-pay | Admitting: *Deleted

## 2011-03-26 NOTE — Telephone Encounter (Signed)
Patient called and left vm c/o hand pain - worse at night. She would like pain med for hands b/c it increases at night. Attempted to call pt - no answer. I left her VM to call office for eval regarding pain.

## 2011-03-27 ENCOUNTER — Ambulatory Visit (INDEPENDENT_AMBULATORY_CARE_PROVIDER_SITE_OTHER): Payer: 59 | Admitting: Family Medicine

## 2011-03-27 VITALS — BP 102/78 | HR 72 | Temp 97.9°F | Wt 224.0 lb

## 2011-03-27 DIAGNOSIS — G5600 Carpal tunnel syndrome, unspecified upper limb: Secondary | ICD-10-CM

## 2011-03-27 MED ORDER — NAPROXEN 500 MG PO TABS: 500.0000 mg | ORAL_TABLET | Freq: Two times a day (BID) | ORAL | Status: AC

## 2011-03-27 NOTE — Patient Instructions (Signed)
Wear wrist splint especially at night for the next couple of weeks. Wear as much as possible during the day. Followup with primary physician in 2-3 weeks if no improvement

## 2011-03-27 NOTE — Progress Notes (Signed)
  Subjective:    Patient ID: [name], female    DOB: 25-Nov-1960, 50 y.o.   MRN: [id number]  HPI Patient seen Saturday work in clinic with right hand pain. Duration is one week. She works in [employer] and frequent use of hands. Symptoms worse at night. Pain is moderate to severe. Achy quality sensation in the fingers- mostly index,thumb, and middle. No numbness or weakness. No known injury. Has taken aspirin and Tylenol without much relief. Took Vicodin last night with some relief. Pain mostly involving the thumb. No swelling. Denies any neck pain.  Patient is R hand dominant.   Review of Systems  Musculoskeletal: Negative for back pain and joint swelling.  Neurological: Negative for weakness.  Hematological: Negative for adenopathy. Does not bruise/bleed easily.       Objective:   Physical Exam  Constitutional: She appears well-developed and well-nourished.  Cardiovascular: Normal rate, regular rhythm and normal heart sounds.   Pulmonary/Chest: Breath sounds normal. No respiratory distress. She has no wheezes. She has no rales.  Musculoskeletal: She exhibits no edema.       Right wrist- full range of motion. No localizing tenderness. Tinel sign is negative.  Neurological:       Full grip strength bilaterally. Normal sensory function to touch. Symmetric upper extremity reflexes  Skin: No rash noted.          Assessment & Plan:  Probable R carpal tunnel syndrome. Recommend wrist splint, especially at night. Naproxen 500 mg twice daily with food and followup with primary in 2 weeks if no better

## 2011-04-04 ENCOUNTER — Emergency Department (HOSPITAL_COMMUNITY)
Admission: EM | Admit: 2011-04-04 | Discharge: 2011-04-04 | Disposition: A | Discharge status: Home / Self Care | Payer: 59 | Attending: Emergency Medicine | Admitting: Emergency Medicine

## 2011-04-04 DIAGNOSIS — G5600 Carpal tunnel syndrome, unspecified upper limb: Secondary | ICD-10-CM | POA: Insufficient documentation

## 2011-04-04 DIAGNOSIS — M79609 Pain in unspecified limb: Secondary | ICD-10-CM | POA: Insufficient documentation

## 2011-04-06 ENCOUNTER — Telehealth: Payer: Self-pay | Admitting: *Deleted

## 2011-04-06 MED ORDER — SULFAMETHOXAZOLE-TRIMETHOPRIM 800-160 MG PO TABS: 1.0000 | ORAL_TABLET | Freq: Two times a day (BID) | ORAL | Status: DC

## 2011-04-06 NOTE — Telephone Encounter (Signed)
Pt is scheduled for OV Thursday. She has carpal tunnel and c/o recurrent infection in her finger. She says this is third infection in same finger and she has green puss draining out. She would like advisement before OV Thursday.

## 2011-04-06 NOTE — Telephone Encounter (Signed)
done

## 2011-04-07 NOTE — Telephone Encounter (Signed)
Patient informed. 

## 2011-04-08 ENCOUNTER — Ambulatory Visit (INDEPENDENT_AMBULATORY_CARE_PROVIDER_SITE_OTHER): Payer: 59 | Admitting: Internal Medicine

## 2011-04-08 ENCOUNTER — Encounter: Payer: Self-pay | Admitting: Internal Medicine

## 2011-04-08 VITALS — BP 116/74 | HR 58 | Temp 98.3°F | Resp 16 | Wt 223.0 lb

## 2011-04-08 DIAGNOSIS — G5601 Carpal tunnel syndrome, right upper limb: Secondary | ICD-10-CM

## 2011-04-08 DIAGNOSIS — G5600 Carpal tunnel syndrome, unspecified upper limb: Secondary | ICD-10-CM

## 2011-04-08 NOTE — Assessment & Plan Note (Signed)
Will check a NCS/EMG of her arms to check the severity of CTS, for now she will continue the meds

## 2011-04-08 NOTE — Progress Notes (Signed)
Subjective:    Patient ID: [name], female    DOB: 1961-09-02, 50 y.o.   MRN: [id number]  HPI She returns for f/up and she tells me that she has had right wrist and index finger pain and was told that she has CTS so she has been taking meds and wearing a wrist splint on the right. She thought the index finger pain was an infection so she called this office 2 days ago and an Rx for Bactrim DS was ordered but she did not start taking it. The index finger is not red or swollen and does not drain any pus.    Review of Systems  Constitutional: Negative for fever, chills, diaphoresis, activity change, appetite change, fatigue and unexpected weight change.  HENT: Negative for facial swelling, neck pain and neck stiffness.   Respiratory: Negative for cough, chest tightness, shortness of breath, wheezing and stridor.   Cardiovascular: Negative for chest pain, palpitations and leg swelling.  Gastrointestinal: Negative for nausea, vomiting, abdominal pain, diarrhea, constipation and blood in stool.  Genitourinary: Negative for dysuria, urgency, frequency, hematuria, enuresis, difficulty urinating and dyspareunia.  Musculoskeletal: Positive for arthralgias (right wrist). Negative for myalgias, back pain, joint swelling and gait problem.  Skin: Negative for color change, pallor and rash.  Neurological: Negative for dizziness, tremors, seizures, syncope, facial asymmetry, weakness, light-headedness, numbness and headaches.  Hematological: Negative for adenopathy. Does not bruise/bleed easily.  Psychiatric/Behavioral: Negative.        Objective:   Physical Exam  Constitutional: She is oriented to person, place, and time. She appears well-developed and well-nourished. No distress.  HENT:  Head: Normocephalic and atraumatic.  Right Ear: External ear normal.  Left Ear: External ear normal.  Nose: Nose normal.  Mouth/Throat: Oropharynx is clear and moist. No oropharyngeal exudate.  Eyes:  Conjunctivae and EOM are normal. Pupils are equal, round, and reactive to light. Right eye exhibits no discharge. Left eye exhibits no discharge. No scleral icterus.  Neck: Normal range of motion. Neck supple. No JVD present. No tracheal deviation present. No thyromegaly present.  Cardiovascular: Normal rate, regular rhythm, normal heart sounds and intact distal pulses.  Exam reveals no gallop and no friction rub.   No murmur heard. Pulmonary/Chest: Effort normal and breath sounds normal. No stridor. No respiratory distress. She has no wheezes. She has no rales. She exhibits no tenderness.  Abdominal: Soft. Bowel sounds are normal. She exhibits no distension. There is no tenderness. There is no rebound and no guarding.  Musculoskeletal: Normal range of motion. She exhibits no edema and no tenderness.       Right hand: She exhibits no tenderness, no bony tenderness, no deformity, no laceration and no swelling. normal sensation noted. Normal strength noted.       I see no evidence of infection in her right hand or her right fingers  Lymphadenopathy:    She has no cervical adenopathy.  Neurological: She is alert and oriented to person, place, and time. She has normal reflexes. She displays normal reflexes. No cranial nerve deficit. She exhibits normal muscle tone. Coordination normal.  Skin: Skin is warm and dry. No rash noted. She is not diaphoretic. No erythema. No pallor.  Psychiatric: She has a normal mood and affect. Her behavior is normal. Judgment and thought content normal.       Lab Results  Component Value Date   WBC 4.6 01/12/2011   HGB 12.8 01/12/2011   HCT 38.8 01/12/2011   PLT 257.0 01/12/2011  CHOL 186 01/12/2011   TRIG 102.0 01/12/2011   HDL 41.00 01/12/2011   ALT 15 01/12/2011   AST 12 01/12/2011   NA 138 01/12/2011   K 4.5 01/12/2011   CL 105 01/12/2011   CREATININE 1.0 01/12/2011   BUN 15 01/12/2011   CO2 29 01/12/2011   TSH 2.23 01/12/2011    Assessment & Plan:

## 2011-04-15 ENCOUNTER — Telehealth: Payer: Self-pay | Admitting: *Deleted

## 2011-04-15 MED ORDER — TRAMADOL HCL 50 MG PO TABS: 50.0000 mg | ORAL_TABLET | Freq: Four times a day (QID) | ORAL | Status: DC | PRN

## 2011-04-15 NOTE — Telephone Encounter (Signed)
done

## 2011-04-15 NOTE — Telephone Encounter (Signed)
Patient requesting RF of tramadol, c/o pain from infection in finger.

## 2011-04-15 NOTE — Telephone Encounter (Signed)
Patient informed. 

## 2011-04-20 ENCOUNTER — Telehealth: Payer: Self-pay | Admitting: *Deleted

## 2011-04-20 NOTE — Telephone Encounter (Signed)
Patient requesting a call back. She says finger that was infected now has clear liquid coming out - wants to know if this is ok?

## 2011-04-20 NOTE — Telephone Encounter (Signed)
Spoke w/pt. She has clear drainage, no fever or puss. Advised that it seems area is healing but to come in for OV w/any change in symptoms.

## 2011-04-27 ENCOUNTER — Telehealth: Payer: Self-pay

## 2011-04-27 NOTE — Telephone Encounter (Signed)
[facility]- ask the pharmacist

## 2011-04-27 NOTE — Telephone Encounter (Signed)
Patient called [facility] stating that she had a hand infection that had cleared up/drainage stopped. She now c/o itchy/peeling fingers and assumes that they are healing. She would like to know what she can put on them to help. Please advise Thanks.

## 2011-04-28 NOTE — Telephone Encounter (Signed)
Patient notified//[facility]  

## 2011-06-10 ENCOUNTER — Ambulatory Visit: Payer: 59 | Admitting: Internal Medicine

## 2011-06-10 DIAGNOSIS — Z0289 Encounter for other administrative examinations: Secondary | ICD-10-CM

## 2011-08-18 LAB — URINALYSIS, ROUTINE W REFLEX MICROSCOPIC
Glucose, UA: NEGATIVE
Hgb urine dipstick: NEGATIVE
Specific Gravity, Urine: 1.023
pH: 6

## 2011-10-20 ENCOUNTER — Ambulatory Visit (INDEPENDENT_AMBULATORY_CARE_PROVIDER_SITE_OTHER)
Admission: RE | Admit: 2011-10-20 | Discharge: 2011-10-20 | Disposition: A | Discharge status: Home / Self Care | Payer: 59 | Source: Ambulatory Visit | Attending: Internal Medicine | Admitting: Internal Medicine

## 2011-10-20 ENCOUNTER — Encounter: Payer: Self-pay | Admitting: Internal Medicine

## 2011-10-20 ENCOUNTER — Ambulatory Visit (INDEPENDENT_AMBULATORY_CARE_PROVIDER_SITE_OTHER): Payer: 59 | Admitting: Internal Medicine

## 2011-10-20 VITALS — BP 112/78 | HR 80 | Temp 98.5°F | Resp 16 | Wt 213.0 lb

## 2011-10-20 DIAGNOSIS — R05 Cough: Secondary | ICD-10-CM

## 2011-10-20 DIAGNOSIS — R059 Cough, unspecified: Secondary | ICD-10-CM | POA: Insufficient documentation

## 2011-10-20 DIAGNOSIS — J209 Acute bronchitis, unspecified: Secondary | ICD-10-CM

## 2011-10-20 MED ORDER — PSEUDOEPH-CHLORPHEN-HYDROCOD 60-4-5 MG/5ML PO SOLN: 5.0000 mL | Freq: Four times a day (QID) | ORAL | Status: DC | PRN

## 2011-10-20 MED ORDER — AZITHROMYCIN 500 MG PO TABS: 500.0000 mg | ORAL_TABLET | Freq: Every day | ORAL | Status: AC

## 2011-10-20 NOTE — Patient Instructions (Signed)

## 2011-10-21 NOTE — Assessment & Plan Note (Signed)
Start zpak for the infection and a cough suppressant 

## 2011-10-21 NOTE — Assessment & Plan Note (Signed)
I will check a CXR to look for pna, mass, edema, etc.

## 2011-10-21 NOTE — Progress Notes (Signed)
Subjective:    Patient ID: [name], female    DOB: 1961/01/21, 50 y.o.   MRN: [id number]  Cough This is a new problem. The current episode started in the past 7 days. The problem has been gradually worsening. The problem occurs every few minutes. The cough is productive of purulent sputum. Associated symptoms include chills, rhinorrhea and a sore throat. Pertinent negatives include no chest pain, ear congestion, ear pain, fever, headaches, heartburn, hemoptysis, myalgias, nasal congestion, postnasal drip, rash, shortness of breath, sweats, weight loss or wheezing. The symptoms are aggravated by nothing. She has tried OTC cough suppressant for the symptoms. The treatment provided no relief. Her past medical history is significant for asthma.      Review of Systems  Constitutional: Positive for chills. Negative for fever, weight loss, diaphoresis, activity change, appetite change, fatigue and unexpected weight change.  HENT: Positive for sore throat and rhinorrhea. Negative for ear pain, nosebleeds, congestion, sneezing, trouble swallowing, voice change, postnasal drip, sinus pressure and tinnitus.   Eyes: Negative.   Respiratory: Positive for cough. Negative for apnea, hemoptysis, choking, chest tightness, shortness of breath, wheezing and stridor.   Cardiovascular: Negative for chest pain, palpitations and leg swelling.  Gastrointestinal: Negative for heartburn, nausea, vomiting, abdominal pain, diarrhea, constipation and abdominal distention.  Genitourinary: Negative for dysuria, urgency, frequency, hematuria, decreased urine volume, enuresis, difficulty urinating and dyspareunia.  Musculoskeletal: Negative for myalgias, back pain, joint swelling, arthralgias and gait problem.  Skin: Negative for color change, pallor, rash and wound.  Neurological: Negative for dizziness, tremors, seizures, syncope, facial asymmetry, speech difficulty, weakness, light-headedness, numbness and headaches.    Hematological: Negative for adenopathy. Does not bruise/bleed easily.  Psychiatric/Behavioral: Negative.        Objective:   Physical Exam  Vitals reviewed. Constitutional: She is oriented to person, place, and time. She appears well-developed and well-nourished. No distress.  HENT:  Head: Normocephalic and atraumatic. No trismus in the jaw.  Right Ear: Hearing, tympanic membrane, external ear and ear canal normal.  Left Ear: Hearing, tympanic membrane, external ear and ear canal normal.  Nose: Mucosal edema and rhinorrhea present. No nose lacerations, sinus tenderness, nasal deformity, septal deviation or nasal septal hematoma. No epistaxis.  No foreign bodies. Right sinus exhibits no maxillary sinus tenderness and no frontal sinus tenderness. Left sinus exhibits no maxillary sinus tenderness.  Mouth/Throat: Oropharynx is clear and moist and mucous membranes are normal. Mucous membranes are not pale, not dry and not cyanotic. No uvula swelling. No oropharyngeal exudate, posterior oropharyngeal edema, posterior oropharyngeal erythema or tonsillar abscesses.  Eyes: Conjunctivae are normal. Right eye exhibits no discharge. Left eye exhibits no discharge. No scleral icterus.  Neck: Normal range of motion. Neck supple. No JVD present. No tracheal deviation present. No thyromegaly present.  Cardiovascular: Normal rate, regular rhythm, normal heart sounds and intact distal pulses.  Exam reveals no gallop and no friction rub.   No murmur heard. Pulmonary/Chest: Effort normal and breath sounds normal. No stridor. No respiratory distress. She has no wheezes. She has no rales. She exhibits no tenderness.  Abdominal: Soft. Bowel sounds are normal. She exhibits no distension and no mass. There is no tenderness. There is no rebound and no guarding.  Musculoskeletal: Normal range of motion. She exhibits no edema and no tenderness.  Lymphadenopathy:    She has no cervical adenopathy.  Neurological: She is  oriented to person, place, and time.  Skin: Skin is warm and dry. No rash noted. She is not  diaphoretic. No erythema. No pallor.  Psychiatric: She has a normal mood and affect. Her behavior is normal. Judgment and thought content normal.          Assessment & Plan:

## 2011-10-22 ENCOUNTER — Telehealth: Payer: Self-pay

## 2011-10-22 NOTE — Telephone Encounter (Signed)
Not related

## 2011-10-22 NOTE — Telephone Encounter (Signed)
Patient notified

## 2011-10-22 NOTE — Telephone Encounter (Signed)
Patient called stating that she was seen wed for cold and started on antibiotic and cough syrup. Per pt, after stating medication, her menstral cycle started which she has not had in over 2mos. She would like to know if this could be related and if it is normal to have a cycle after 2 mos. Thanks

## 2011-11-13 ENCOUNTER — Ambulatory Visit: Payer: 59 | Admitting: Internal Medicine

## 2011-11-15 ENCOUNTER — Ambulatory Visit (INDEPENDENT_AMBULATORY_CARE_PROVIDER_SITE_OTHER): Payer: 59 | Admitting: Internal Medicine

## 2011-11-15 ENCOUNTER — Other Ambulatory Visit (INDEPENDENT_AMBULATORY_CARE_PROVIDER_SITE_OTHER): Payer: 59

## 2011-11-15 ENCOUNTER — Telehealth: Payer: Self-pay

## 2011-11-15 ENCOUNTER — Encounter: Payer: Self-pay | Admitting: Internal Medicine

## 2011-11-15 DIAGNOSIS — K5900 Constipation, unspecified: Secondary | ICD-10-CM

## 2011-11-15 LAB — CBC WITH DIFFERENTIAL/PLATELET
Basophils Relative: 0.5 % (ref 0.0–3.0)
Eosinophils Relative: 2.7 % (ref 0.0–5.0)
HCT: 37.7 % (ref 36.0–46.0)
Lymphs Abs: 3.1 10*3/uL (ref 0.7–4.0)
MCV: 81.1 fl (ref 78.0–100.0)
Monocytes Absolute: 0.5 10*3/uL (ref 0.1–1.0)
Monocytes Relative: 7.6 % (ref 3.0–12.0)
RBC: 4.64 Mil/uL (ref 3.87–5.11)
WBC: 6.2 10*3/uL (ref 4.5–10.5)

## 2011-11-15 LAB — TSH: TSH: 0.94 u[IU]/mL (ref 0.35–5.50)

## 2011-11-15 MED ORDER — POLYETHYLENE GLYCOL 3350 17 GM/SCOOP PO POWD: 17.0000 g | Freq: Two times a day (BID) | ORAL | Status: AC | PRN

## 2011-11-15 NOTE — Assessment & Plan Note (Signed)
The obvious explanation for the development of constipation is the recent use of a narcotic cough suppressant so she will stop that, also I will check her labs today to look for hypothyroidism as a cause as well as high calcium etc that could be causing the constipation, for now she will try miralax as needed for symptom relief

## 2011-11-15 NOTE — Progress Notes (Signed)
Subjective:    Patient ID: [name], female    DOB: 21-Aug-1961, 51 y.o.   MRN: [id number]  Constipation This is a new problem. The current episode started 1 to 4 weeks ago. The problem has been gradually worsening since onset. Her stool frequency is 1 time per day. The stool is described as firm and formed. The patient is not on a high fiber diet. She does not exercise regularly. There has not been adequate water intake. Pertinent negatives include no abdominal pain, anorexia, back pain, bloating, diarrhea, difficulty urinating, fecal incontinence, fever, flatus, hematochezia, hemorrhoids, melena, nausea, rectal pain, vomiting or weight loss. Risk factors include change in medication usage/dosage (tramadol and hydrocodone for the cough). She has tried laxatives for the symptoms. The treatment provided moderate relief.      Review of Systems  Constitutional: Negative for fever, chills, weight loss, diaphoresis, activity change, appetite change, fatigue and unexpected weight change.  HENT: Negative.   Eyes: Negative.   Respiratory: Negative for cough, chest tightness, shortness of breath, wheezing and stridor.   Cardiovascular: Negative for chest pain, palpitations and leg swelling.  Gastrointestinal: Positive for constipation. Negative for nausea, vomiting, abdominal pain, diarrhea, blood in stool, melena, hematochezia, abdominal distention, anal bleeding, rectal pain, bloating, anorexia, flatus and hemorrhoids.  Genitourinary: Negative for dysuria, urgency, hematuria, flank pain, decreased urine volume, enuresis, difficulty urinating and dyspareunia.  Musculoskeletal: Negative for myalgias, back pain, joint swelling, arthralgias and gait problem.  Skin: Negative for color change, pallor, rash and wound.  Neurological: Negative for dizziness, tremors, seizures, syncope, facial asymmetry, speech difficulty, weakness, light-headedness, numbness and headaches.  Hematological: Negative for  adenopathy. Does not bruise/bleed easily.  Psychiatric/Behavioral: Negative.        Objective:   Physical Exam  Vitals reviewed. Constitutional: She is oriented to person, place, and time. She appears well-developed and well-nourished. No distress.  HENT:  Head: Normocephalic and atraumatic.  Mouth/Throat: Oropharynx is clear and moist. No oropharyngeal exudate.  Eyes: Conjunctivae are normal. Right eye exhibits no discharge. Left eye exhibits no discharge. No scleral icterus.  Neck: Normal range of motion. Neck supple. No JVD present. No tracheal deviation present. No thyromegaly present.  Cardiovascular: Normal rate, regular rhythm, normal heart sounds and intact distal pulses.  Exam reveals no gallop and no friction rub.   No murmur heard. Pulmonary/Chest: Effort normal and breath sounds normal. No stridor. No respiratory distress. She has no wheezes. She has no rales. She exhibits no tenderness.  Abdominal: Soft. Normal appearance and bowel sounds are normal. She exhibits no shifting dullness, no distension, no pulsatile liver, no fluid wave, no abdominal bruit, no ascites, no pulsatile midline mass and no mass. There is no hepatosplenomegaly. There is no tenderness. There is no rebound, no guarding and no CVA tenderness.  Musculoskeletal: Normal range of motion. She exhibits no edema and no tenderness.  Lymphadenopathy:    She has no cervical adenopathy.  Neurological: She is oriented to person, place, and time.  Skin: Skin is warm and dry. No rash noted. She is not diaphoretic. No erythema. No pallor.  Psychiatric: She has a normal mood and affect. Her behavior is normal. Judgment and thought content normal.      Lab Results  Component Value Date   WBC 4.6 01/12/2011   HGB 12.8 01/12/2011   HCT 38.8 01/12/2011   PLT 257.0 01/12/2011   GLUCOSE 90 01/12/2011   CHOL 186 01/12/2011   TRIG 102.0 01/12/2011   HDL 41.00 01/12/2011  LDLCALC 125* 01/12/2011   ALT 15 01/12/2011   AST 12 01/12/2011    NA 138 01/12/2011   K 4.5 01/12/2011   CL 105 01/12/2011   CREATININE 1.0 01/12/2011   BUN 15 01/12/2011   CO2 29 01/12/2011   TSH 2.23 01/12/2011      Assessment & Plan:

## 2011-11-15 NOTE — Patient Instructions (Signed)

## 2011-11-15 NOTE — Telephone Encounter (Signed)
[facility] Triage Call Report Triage Record Num: [id number] Operator: Valene Bors Patient Name: [name] Call Date & Time: 11/13/2011 8:33:51AM Patient Phone: [phone] PCP: Sanda Linger Patient Gender: Female PCP Fax : Patient DOB: Aug 20, 1961 Practice Name: [facility] Reason for Call: Caller:Lorenzo/Spouse is calling for Hailea/Patient- since she is at work; PCP: Sanda Linger; CB#: [phone]; Call Reason: Constipation-no bm in 3 days. Had some spotting today but husband not sure if rectal bleeding; Sx Onset: 11/12/2011; Sx Notes:Requesting appnt ; Afebrile; Wt: ; Home treatment(s) tried: Laxative-took last week and it helped; Did home treatment help?: but did not want to take laxative last night since she is working today; Guideline Used:Unable to triage since was not able to contact patient at work ; Appt Scheduled?: Callback set up for 1000 to speak with [name] on her break, but she did not answer at 1017. Spoke with Husband and transfered call to office for appnt. Protocol(s) Used: Office Note Recommended Outcome per Protocol: Information Noted and Sent to Office Reason for Outcome: Caller information to office Care Advice: ~ 11/13/2011 10:25:58AM Page 1 of 1 [id number]

## 2011-11-16 LAB — COMPREHENSIVE METABOLIC PANEL
Albumin: 4 g/dL (ref 3.5–5.2)
Alkaline Phosphatase: 133 U/L — ABNORMAL HIGH (ref 39–117)
BUN: 13 mg/dL (ref 6–23)
CO2: 29 mEq/L (ref 19–32)
GFR: 77.14 mL/min (ref >60.00)
Glucose, Bld: 84 mg/dL (ref 70–99)
Potassium: 4.5 mEq/L (ref 3.5–5.1)
Total Bilirubin: 0.5 mg/dL (ref 0.3–1.2)
Total Protein: 7.8 g/dL (ref 6.0–8.3)

## 2011-12-13 ENCOUNTER — Ambulatory Visit (INDEPENDENT_AMBULATORY_CARE_PROVIDER_SITE_OTHER)
Admission: RE | Admit: 2011-12-13 | Discharge: 2011-12-13 | Disposition: A | Discharge status: Home / Self Care | Payer: 59 | Source: Ambulatory Visit | Attending: Internal Medicine | Admitting: Internal Medicine

## 2011-12-13 ENCOUNTER — Encounter: Payer: Self-pay | Admitting: Internal Medicine

## 2011-12-13 ENCOUNTER — Ambulatory Visit (INDEPENDENT_AMBULATORY_CARE_PROVIDER_SITE_OTHER): Payer: 59 | Admitting: Internal Medicine

## 2011-12-13 VITALS — BP 122/78 | HR 68 | Temp 98.1°F | Resp 16 | Wt 219.0 lb

## 2011-12-13 DIAGNOSIS — M25539 Pain in unspecified wrist: Secondary | ICD-10-CM

## 2011-12-13 DIAGNOSIS — G5600 Carpal tunnel syndrome, unspecified upper limb: Secondary | ICD-10-CM

## 2011-12-13 DIAGNOSIS — M25532 Pain in left wrist: Secondary | ICD-10-CM | POA: Insufficient documentation

## 2011-12-13 DIAGNOSIS — G5601 Carpal tunnel syndrome, right upper limb: Secondary | ICD-10-CM

## 2011-12-13 MED ORDER — TRAMADOL HCL 50 MG PO TABS: 50.0000 mg | ORAL_TABLET | Freq: Three times a day (TID) | ORAL | Status: DC | PRN

## 2011-12-13 NOTE — Patient Instructions (Signed)
Carpal Tunnel Syndrome The carpal tunnel is a narrow hollow area in the wrist. It is formed by the wrist bones and ligaments. Nerves, blood vessels, and tendons (cord like structures which attach muscle to bone) on the palm side (the side of your hand in the direction your fingers bend) of your hand pass through the carpal tunnel. Repeated wrist motion or certain diseases may cause swelling within the tunnel. (That is why these are called repetitive trauma (damage caused by over use) disorders. It is also a common problem in late pregnancy.) This swelling pinches the main nerve in the wrist (median nerve) and causes the painful condition called carpal tunnel syndrome. A feeling of "pins and needles" may be noticed in the fingers or hand; however, the entire arm may ache from this condition. Carpal tunnel syndrome may clear up by itself. Cortisone injections may help. Sometimes, an operation may be needed to free the pinched nerve. An electromyogram (a type of test) may be needed to confirm this diagnosis (learning what is wrong). This is a test which measures nerve conduction. The nerve conduction is usually slowed in a carpal tunnel syndrome. HOME CARE INSTRUCTIONS   If your caregiver prescribed medication to help reduce swelling, take as directed.   If you were given a splint to keep your wrist from bending, use it as instructed. It is important to wear the splint at night. Use the splint for as long as you have pain or numbness in your hand, arm or wrist. This may take 1 to 2 months.   If you have pain at night, it may help to rub or shake your hand, or elevate your hand above the level of your heart (the center of your chest).   It is important to give your wrist a rest by stopping the activities that are causing the problem. If your symptoms (problems) are work-related, you may need to talk to your employer about changing to a job that does not require using your wrist.   Only take over-the-counter  or prescription medicines for pain, discomfort, or fever as directed by your caregiver.   Following periods of extended use, particularly strenuous use, apply an ice pack wrapped in a towel to the anterior (palm) side of the affected wrist for 20 to 30 minutes. Repeat as needed three to four times per day. This will help reduce the swelling.   Follow all instructions for follow-up with your caregiver. This includes any orthopedic referrals, physical therapy, and rehabilitation. Any delay in obtaining necessary care could result in a delay or failure of your condition to heal.  SEEK IMMEDIATE MEDICAL CARE IF:   You are still having pain and numbness following a week of treatment.   You develop new, unexplained symptoms.   Your current symptoms are getting worse and are not helped or controlled with medications.  MAKE SURE YOU:   Understand these instructions.   Will watch your condition.   Will get help right away if you are not doing well or get worse.  Document Released: 10/22/2000 Document Revised: 07/07/2011 Document Reviewed: 05/29/2008 [employer] Patient Information 2012 [employer]. 

## 2011-12-13 NOTE — Assessment & Plan Note (Signed)
NCS/EMG were ordered

## 2011-12-13 NOTE — Progress Notes (Signed)
  Subjective:    Patient ID: [name], female    DOB: 1960-11-27, 51 y.o.   MRN: [id number]  Arm Pain  The incident occurred more than 1 week ago. The incident occurred at work. The injury mechanism was repetitive motion. The pain is present in the left wrist and left forearm. The quality of the pain is described as aching. The pain does not radiate. The pain is at a severity of 2/10. The pain is mild. The pain has been intermittent since the incident. Associated symptoms include numbness (left hand) and tingling (left hand). Pertinent negatives include no chest pain or muscle weakness. The symptoms are aggravated by movement. She has tried NSAIDs for the symptoms. The treatment provided mild relief.      Review of Systems  Constitutional: Negative.   HENT: Negative.   Eyes: Negative.   Respiratory: Negative.   Cardiovascular: Negative.  Negative for chest pain.  Gastrointestinal: Negative.   Genitourinary: Negative.   Musculoskeletal: Positive for arthralgias. Negative for myalgias, back pain, joint swelling and gait problem.  Neurological: Positive for tingling (left hand) and numbness (left hand). Negative for dizziness, tremors, seizures, syncope, facial asymmetry, speech difficulty, weakness, light-headedness and headaches.  Hematological: Negative for adenopathy. Does not bruise/bleed easily.  Psychiatric/Behavioral: Negative.        Objective:   Physical Exam  Vitals reviewed. Constitutional: She is oriented to person, place, and time. She appears well-developed and well-nourished. No distress.  HENT:  Head: Normocephalic and atraumatic.  Mouth/Throat: No oropharyngeal exudate.  Eyes: Conjunctivae are normal. Right eye exhibits no discharge. Left eye exhibits no discharge. No scleral icterus.  Neck: Normal range of motion. Neck supple. No JVD present. No tracheal deviation present. No thyromegaly present.  Cardiovascular: Normal rate, regular rhythm, normal heart sounds  and intact distal pulses.  Exam reveals no gallop and no friction rub.   No murmur heard. Pulmonary/Chest: Effort normal and breath sounds normal. No stridor. No respiratory distress. She has no wheezes. She has no rales. She exhibits no tenderness.  Abdominal: Soft. Bowel sounds are normal. She exhibits no distension and no mass. There is no tenderness. There is no rebound and no guarding.  Musculoskeletal: Normal range of motion. She exhibits no edema and no tenderness.       Left wrist: Normal. She exhibits normal range of motion, no tenderness, no bony tenderness, no swelling, no effusion, no crepitus, no deformity and no laceration.       - Phalen's and Tinel's tests  Lymphadenopathy:    She has no cervical adenopathy.  Neurological: She is oriented to person, place, and time. She has normal reflexes. She displays normal reflexes. She exhibits normal muscle tone.  Skin: Skin is warm and dry. No rash noted. She is not diaphoretic. No erythema. No pallor.          Assessment & Plan:

## 2011-12-13 NOTE — Assessment & Plan Note (Signed)
I will check a plain film to look for fracture, djd, etc. Also I have asked her to get a NCS/EMG done to see if she has CTS. She will try tramadol for additional pain relief.

## 2011-12-30 ENCOUNTER — Telehealth: Payer: Self-pay | Admitting: *Deleted

## 2011-12-30 NOTE — Telephone Encounter (Signed)
Received message from pt that the pain medication she received for her arm is causing her to have a migraine. Pt is requesting an alternate pain medication to try as she continues to have arm pain. Left message on voicemail for pt to return my call.

## 2011-12-30 NOTE — Telephone Encounter (Signed)
Pt states she developed a headache the day she started the Tramadol, experienced increase heart rate. Pt continues to have left hand pain. Pt is wearing a brace at work but is having some swelling of her hand now. Pt wants to know if she should apply heat or ice?  Pt has not taken any more of the medication and is requesting an alternative?  Verified with pt that she saw Dr Anne Hahn, neurologist on 12/24/11 and was diagnosed with carpal tunnel. Advised pt to contact Dr Anne Hahn for further management of her symptoms.

## 2012-02-01 ENCOUNTER — Other Ambulatory Visit: Payer: Self-pay | Admitting: Internal Medicine

## 2012-04-12 ENCOUNTER — Ambulatory Visit: Payer: 59 | Admitting: Internal Medicine

## 2012-04-12 ENCOUNTER — Telehealth: Payer: Self-pay

## 2012-04-12 NOTE — Telephone Encounter (Signed)
Added on at 1:45 today.

## 2012-04-12 NOTE — Telephone Encounter (Signed)
Pt called requesting work in appt with [facility] for today. Pt c/o swollen painful wrist that she believes is caused by carpal tunnel.  Please advise if pt can be worked in today because she cannot get time off of work for the rest of the week.

## 2012-04-12 NOTE — Telephone Encounter (Signed)
yes

## 2012-04-14 ENCOUNTER — Ambulatory Visit: Payer: 59 | Admitting: Internal Medicine

## 2012-05-17 ENCOUNTER — Ambulatory Visit (INDEPENDENT_AMBULATORY_CARE_PROVIDER_SITE_OTHER)
Admission: RE | Admit: 2012-05-17 | Discharge: 2012-05-17 | Disposition: A | Discharge status: Home / Self Care | Payer: 59 | Source: Ambulatory Visit | Attending: Internal Medicine | Admitting: Internal Medicine

## 2012-05-17 ENCOUNTER — Ambulatory Visit (INDEPENDENT_AMBULATORY_CARE_PROVIDER_SITE_OTHER): Payer: 59 | Admitting: Internal Medicine

## 2012-05-17 ENCOUNTER — Telehealth: Payer: Self-pay | Admitting: Internal Medicine

## 2012-05-17 ENCOUNTER — Other Ambulatory Visit (INDEPENDENT_AMBULATORY_CARE_PROVIDER_SITE_OTHER): Payer: 59

## 2012-05-17 ENCOUNTER — Encounter: Payer: Self-pay | Admitting: Internal Medicine

## 2012-05-17 VITALS — BP 112/80 | HR 80 | Temp 98.1°F | Resp 16 | Wt 219.0 lb

## 2012-05-17 DIAGNOSIS — G5600 Carpal tunnel syndrome, unspecified upper limb: Secondary | ICD-10-CM

## 2012-05-17 DIAGNOSIS — G5601 Carpal tunnel syndrome, right upper limb: Secondary | ICD-10-CM

## 2012-05-17 DIAGNOSIS — R10813 Right lower quadrant abdominal tenderness: Secondary | ICD-10-CM

## 2012-05-17 DIAGNOSIS — R319 Hematuria, unspecified: Secondary | ICD-10-CM

## 2012-05-17 LAB — CBC WITH DIFFERENTIAL/PLATELET
Basophils Absolute: 0 10*3/uL (ref 0.0–0.1)
Eosinophils Relative: 4.9 % (ref 0.0–5.0)
Lymphocytes Relative: 51.4 % — ABNORMAL HIGH (ref 12.0–46.0)
Lymphs Abs: 2.2 10*3/uL (ref 0.7–4.0)
Monocytes Relative: 8.3 % (ref 3.0–12.0)
Neutrophils Relative %: 34.7 % — ABNORMAL LOW (ref 43.0–77.0)
Platelets: 208 10*3/uL (ref 150.0–400.0)
RDW: 15.9 % — ABNORMAL HIGH (ref 11.5–14.6)
WBC: 4.3 10*3/uL — ABNORMAL LOW (ref 4.5–10.5)

## 2012-05-17 LAB — COMPREHENSIVE METABOLIC PANEL
ALT: 14 U/L (ref 0–35)
Albumin: 3.5 g/dL (ref 3.5–5.2)
CO2: 28 mEq/L (ref 19–32)
Calcium: 9 mg/dL (ref 8.4–10.5)
Chloride: 103 mEq/L (ref 96–112)
GFR: 71.09 mL/min (ref >60.00)
Glucose, Bld: 94 mg/dL (ref 70–99)
Sodium: 137 mEq/L (ref 135–145)
Total Protein: 7.5 g/dL (ref 6.0–8.3)

## 2012-05-17 LAB — URINALYSIS, ROUTINE W REFLEX MICROSCOPIC
Bilirubin Urine: NEGATIVE
Leukocytes, UA: NEGATIVE
Nitrite: NEGATIVE
Total Protein, Urine: NEGATIVE
pH: 6 (ref 5.0–8.0)

## 2012-05-17 LAB — SEDIMENTATION RATE: Sed Rate: 21 mm/hr (ref 0–22)

## 2012-05-17 LAB — AMYLASE: Amylase: 62 U/L (ref 27–131)

## 2012-05-17 NOTE — Assessment & Plan Note (Signed)
Her exam is benign, I will check a plain xray of the abd to look for SBO, ileus, stones and will look at some labs to look for hepatitis, pancreatitis, uti, renal stones, infection, etc.

## 2012-05-17 NOTE — Telephone Encounter (Signed)
Caller: Pieper/Patient; PCP: Sanda Linger; CB#: [phone]; Call regarding R. Sided Abdominal Pain;  Onset 05/16/12.  Afebrile.  LMP 05/16/12.  BTL. Hx recent constipation; MD ordered Polyethylene gylcol (Miralax) prn.  Last BM 05/16/12.  Concerned also about menses after 1 yr without menses.  Reports initially had heavy flow when got up and has cramping and thighs.  Advised to see MD now for unbearable abdominal/pelvic pain per Abdominal Pain Guideline. Appt scheduled for 1100 05/17/12 with Dr Yetta Barre.

## 2012-05-17 NOTE — Assessment & Plan Note (Signed)
She needs a new ortho referral

## 2012-05-17 NOTE — Progress Notes (Signed)
Subjective:    Patient ID: [name], female    DOB: 1961/02/13, 51 y.o.   MRN: [id number]  Abdominal Pain This is a new problem. The current episode started in the past 7 days. The onset quality is sudden. The problem occurs intermittently. The problem has been unchanged. The pain is located in the RLQ. The pain is at a severity of 2/10. The quality of the pain is cramping and colicky. The abdominal pain does not radiate. Associated symptoms include arthralgias (chronic B wrist pain), constipation and diarrhea. Pertinent negatives include no anorexia, belching, dysuria, fever, flatus, frequency, headaches, hematochezia, hematuria, melena, myalgias, nausea, vomiting or weight loss. Nothing aggravates the pain. The pain is relieved by bowel movements. She has tried nothing for the symptoms.      Review of Systems  Constitutional: Negative for fever, chills, weight loss, diaphoresis, activity change, appetite change, fatigue and unexpected weight change.  HENT: Negative.   Eyes: Negative.   Respiratory: Negative for cough, chest tightness, shortness of breath, wheezing and stridor.   Cardiovascular: Negative for chest pain, palpitations and leg swelling.  Gastrointestinal: Positive for abdominal pain, diarrhea and constipation. Negative for nausea, vomiting, blood in stool, melena, hematochezia, abdominal distention, anal bleeding, rectal pain, anorexia and flatus.  Genitourinary: Negative for dysuria, urgency, frequency, hematuria, flank pain, decreased urine volume, enuresis, difficulty urinating and dyspareunia.  Musculoskeletal: Positive for arthralgias (chronic B wrist pain). Negative for myalgias.  Skin: Negative.   Neurological: Negative.  Negative for headaches.  Hematological: Negative.  Negative for adenopathy. Does not bruise/bleed easily.  Psychiatric/Behavioral: Negative.        Objective:   Physical Exam  Vitals reviewed. Constitutional: She is oriented to person,  place, and time. She appears well-developed and well-nourished.  Non-toxic appearance. She does not have a sickly appearance. She does not appear ill. No distress.  HENT:  Head: Normocephalic and atraumatic.  Mouth/Throat: Oropharynx is clear and moist. No oropharyngeal exudate.  Eyes: Conjunctivae are normal. Right eye exhibits no discharge. Left eye exhibits no discharge. No scleral icterus.  Neck: Normal range of motion. Neck supple. No JVD present. No tracheal deviation present. No thyromegaly present.  Cardiovascular: Normal rate, regular rhythm, normal heart sounds and intact distal pulses.  Exam reveals no gallop and no friction rub.   No murmur heard. Pulmonary/Chest: Effort normal and breath sounds normal. No stridor. No respiratory distress. She has no wheezes. She has no rales. She exhibits no tenderness.  Abdominal: Soft. Normal appearance and bowel sounds are normal. She exhibits no shifting dullness, no distension, no pulsatile liver, no fluid wave, no abdominal bruit, no ascites, no pulsatile midline mass and no mass. There is no hepatosplenomegaly, splenomegaly or hepatomegaly. There is tenderness in the right upper quadrant, right lower quadrant and epigastric area. There is no rigidity, no rebound, no guarding, no CVA tenderness, no tenderness at McBurney's point and negative Murphy's sign. No hernia. Hernia confirmed negative in the ventral area, confirmed negative in the right inguinal area and confirmed negative in the left inguinal area.  Musculoskeletal: Normal range of motion. She exhibits no edema and no tenderness.  Lymphadenopathy:    She has no cervical adenopathy.  Neurological: She is oriented to person, place, and time.  Skin: Skin is warm and dry. No rash noted. She is not diaphoretic. No erythema. No pallor.  Psychiatric: She has a normal mood and affect. Her behavior is normal. Judgment and thought content normal.      Lab Results  Component  Value Date   WBC  6.2 11/15/2011   HGB 12.4 11/15/2011   HCT 37.7 11/15/2011   PLT 198.0 11/15/2011   GLUCOSE 84 11/15/2011   CHOL 186 01/12/2011   TRIG 102.0 01/12/2011   HDL 41.00 01/12/2011   LDLCALC 125* 01/12/2011   ALT 20 11/15/2011   AST 18 11/15/2011   NA 140 11/15/2011   K 4.5 11/15/2011   CL 105 11/15/2011   CREATININE 1.0 11/15/2011   BUN 13 11/15/2011   CO2 29 11/15/2011   TSH 0.94 11/15/2011      Assessment & Plan:

## 2012-05-17 NOTE — Patient Instructions (Signed)

## 2012-05-17 NOTE — Addendum Note (Signed)
Addended by: Etta Grandchild on: 05/17/2012 04:57 PM   Modules accepted: Orders

## 2012-05-18 ENCOUNTER — Emergency Department (HOSPITAL_COMMUNITY): Payer: 59

## 2012-05-18 ENCOUNTER — Encounter (HOSPITAL_COMMUNITY): Payer: Self-pay | Admitting: *Deleted

## 2012-05-18 ENCOUNTER — Telehealth: Payer: Self-pay

## 2012-05-18 ENCOUNTER — Emergency Department (HOSPITAL_COMMUNITY)
Admission: EM | Admit: 2012-05-18 | Discharge: 2012-05-19 | Disposition: A | Discharge status: Home / Self Care | Payer: 59 | Attending: Emergency Medicine | Admitting: Emergency Medicine

## 2012-05-18 DIAGNOSIS — R1032 Left lower quadrant pain: Secondary | ICD-10-CM | POA: Insufficient documentation

## 2012-05-18 DIAGNOSIS — R102 Pelvic and perineal pain: Secondary | ICD-10-CM

## 2012-05-18 DIAGNOSIS — N924 Excessive bleeding in the premenopausal period: Secondary | ICD-10-CM | POA: Insufficient documentation

## 2012-05-18 DIAGNOSIS — N949 Unspecified condition associated with female genital organs and menstrual cycle: Secondary | ICD-10-CM | POA: Insufficient documentation

## 2012-05-18 DIAGNOSIS — J45909 Unspecified asthma, uncomplicated: Secondary | ICD-10-CM | POA: Insufficient documentation

## 2012-05-18 LAB — URINALYSIS, ROUTINE W REFLEX MICROSCOPIC
Nitrite: POSITIVE — AB
Specific Gravity, Urine: 1.033 — ABNORMAL HIGH (ref 1.005–1.030)
Urobilinogen, UA: 1 mg/dL (ref 0.0–1.0)

## 2012-05-18 LAB — URINE MICROSCOPIC-ADD ON

## 2012-05-18 LAB — CBC WITH DIFFERENTIAL/PLATELET
Basophils Absolute: 0 10*3/uL (ref 0.0–0.1)
Eosinophils Absolute: 0.2 10*3/uL (ref 0.0–0.7)
Eosinophils Relative: 3 % (ref 0–5)
Lymphocytes Relative: 45 % (ref 12–46)
MCV: 80.2 fL (ref 78.0–100.0)
Neutrophils Relative %: 42 % — ABNORMAL LOW (ref 43–77)
Platelets: 205 10*3/uL (ref 150–400)
RDW: 15.4 % (ref 11.5–15.5)
WBC: 6.3 10*3/uL (ref 4.0–10.5)

## 2012-05-18 LAB — WET PREP, GENITAL
Trich, Wet Prep: NONE SEEN
Yeast Wet Prep HPF POC: NONE SEEN

## 2012-05-18 LAB — COMPREHENSIVE METABOLIC PANEL
ALT: 11 U/L (ref 0–35)
AST: 15 U/L (ref 0–37)
Calcium: 9.6 mg/dL (ref 8.4–10.5)
Sodium: 141 mEq/L (ref 135–145)
Total Protein: 7.9 g/dL (ref 6.0–8.3)

## 2012-05-18 MED ORDER — TRAMADOL HCL 50 MG PO TABS: 50.0000 mg | ORAL_TABLET | Freq: Three times a day (TID) | ORAL | Status: AC | PRN

## 2012-05-18 MED ORDER — IOHEXOL 300 MG/ML  SOLN
100.0000 mL | Freq: Once | INTRAMUSCULAR | Status: AC | PRN
  Administered 2012-05-18: 100 mL via INTRAVENOUS

## 2012-05-18 MED ORDER — IOHEXOL 300 MG/ML  SOLN
20.0000 mL | INTRAMUSCULAR | Status: AC
  Administered 2012-05-18: 20 mL via ORAL

## 2012-05-18 NOTE — Telephone Encounter (Signed)
Pt called stating that she was a seen by [facility] 07/10 for bleeding and severe cramping that has gotten heavier today. After questioning pt she is unclear whether bleeding is rectal or vaginal? Please advise, pt is also requesting medications for cramping.

## 2012-05-18 NOTE — ED Notes (Signed)
Vaginal bleeding since yesterday.  She has not had a period in 2 years until 2 days ago.  She was taking a med for  Constipation when the bleeding began

## 2012-05-18 NOTE — ED Provider Notes (Signed)
History     CSN: [id number]  Arrival date & time 05/18/12  1740   First MD Initiated Contact with Patient 05/18/12 2105      Chief Complaint  Patient presents with  . Vaginal Bleeding    (Consider location/radiation/quality/duration/timing/severity/associated sxs/prior treatment) Patient is a 51 y.o. female presenting with vaginal bleeding. The history is provided by the patient.  Vaginal Bleeding  She is perimenopausal and has not had a menses for 2 years. 4 days ago, she started bleeding and states that it is heavier than her normal menses had been. She states she was using 2 pads a day yesterday with only one patent day today. She's also been having right lower quadrant pain which is severe at 10/10 at its worse, but is moderate in intensity at 4/10 currently. There is no associated nausea, vomiting, diarrhea, dysuria. Nothing makes her pain better and nothing makes it worse. She did take ibuprofen which gave partial relief. She had seen her PCP who had ordered a CT scan but CT scan has not been done yet. She denies any upper, pain or flank pain.  Past Medical History  Diagnosis Date  . Allergy     rhinitis  . Asthma   . Headache   . Anemia     Past Surgical History  Procedure Date  . Tubal ligation     Family History  Problem Relation Age of Onset  . Arthritis Other   . Diabetes Other   . Hypertension Other     History  Substance Use Topics  . Smoking status: Never Smoker   . Smokeless tobacco: Not on file  . Alcohol Use: No    OB History    Grav Para Term Preterm Abortions TAB SAB Ect Mult Living                  Review of Systems  Genitourinary: Positive for vaginal bleeding.  All other systems reviewed and are negative.    Allergies  Review of patient's allergies indicates no known allergies.  Home Medications   Current Outpatient Rx  Name Route Sig Dispense Refill  . DESONIDE 0.05 % EX LOTN Topical Apply 1 application topically daily.    .  MOMETASONE FUROATE 50 MCG/ACT NA SUSP Nasal Place 2 sprays into the nose daily as needed. For allergies    . POLYETHYLENE GLYCOL 3350 PO POWD Oral Take 17 g by mouth daily as needed. For constipation    . OLOPATADINE HCL 0.2 % OP SOLN Ophthalmic Apply 1 drop to eye daily. For 10 days for allergic conjunctivitis    . TRAMADOL HCL 50 MG PO TABS Oral Take 1 tablet (50 mg total) by mouth every 8 (eight) hours as needed for pain. 50 tablet 0    BP 115/70  Pulse 66  Temp 98.2 F (36.8 C) (Oral)  Resp 18  SpO2 97%  LMP 05/17/2012  Physical Exam  Nursing note and vitals reviewed.  51 year old female is resting comfortably in no acute distress. Vital signs are normal. Oxygen saturation is 97% which is normal. Head is normal cephalic and atraumatic. PERRLA, EOMI. Neck is nontender and supple. Back is nontender. Lungs are clear without any rales, wheezes, rhonchi. Heart has regular rate rhythm without murmur. Abdomen is soft, flat, with mild right lower quadrant tenderness. There is no rebound or guarding. No other abdominal tenderness is elicited. There's no CVA tenderness. Pelvic: Normal external female genitalia, cervix is closed by a small amount of blood coming through  the cervix, there are no adnexal masses or tenderness, fundus is normal size and position without tenderness and no cervical motion tenderness. Extremities have no cyanosis or edema, full range of motion is present. Skin is warm and dry without rash. Neurologic: Mental status is normal, cranial nerves are intact, there are no motor or sensory deficits.  ED Course  Procedures (including critical care time)  . Results for orders placed during the hospital encounter of 05/18/12  URINALYSIS, ROUTINE W REFLEX MICROSCOPIC      Component Value Range   Color, Urine RED (*) YELLOW   APPearance CLOUDY (*) CLEAR   Specific Gravity, Urine 1.033 (*) 1.005 - 1.030   pH 5.5  5.0 - 8.0   Glucose, UA NEGATIVE  NEGATIVE mg/dL   Hgb urine  dipstick LARGE (*) NEGATIVE   Bilirubin Urine MODERATE (*) NEGATIVE   Ketones, ur 15 (*) NEGATIVE mg/dL   Protein, ur 409 (*) NEGATIVE mg/dL   Urobilinogen, UA 1.0  0.0 - 1.0 mg/dL   Nitrite POSITIVE (*) NEGATIVE   Leukocytes, UA SMALL (*) NEGATIVE  PREGNANCY, URINE      Component Value Range   Preg Test, Ur NEGATIVE  NEGATIVE  CBC WITH DIFFERENTIAL      Component Value Range   WBC 6.3  4.0 - 10.5 K/uL   RBC 4.75  3.87 - 5.11 MIL/uL   Hemoglobin 12.2  12.0 - 15.0 g/dL   HCT 81.1  91.4 - 78.2 %   MCV 80.2  78.0 - 100.0 fL   MCH 25.7 (*) 26.0 - 34.0 pg   MCHC 32.0  30.0 - 36.0 g/dL   RDW 95.6  21.3 - 08.6 %   Platelets 205  150 - 400 K/uL   Neutrophils Relative 42 (*) 43 - 77 %   Neutro Abs 2.7  1.7 - 7.7 K/uL   Lymphocytes Relative 45  12 - 46 %   Lymphs Abs 2.8  0.7 - 4.0 K/uL   Monocytes Relative 10  3 - 12 %   Monocytes Absolute 0.6  0.1 - 1.0 K/uL   Eosinophils Relative 3  0 - 5 %   Eosinophils Absolute 0.2  0.0 - 0.7 K/uL   Basophils Relative 1  0 - 1 %   Basophils Absolute 0.0  0.0 - 0.1 K/uL  COMPREHENSIVE METABOLIC PANEL      Component Value Range   Sodium 141  135 - 145 mEq/L   Potassium 3.9  3.5 - 5.1 mEq/L   Chloride 104  96 - 112 mEq/L   CO2 27  19 - 32 mEq/L   Glucose, Bld 77  70 - 99 mg/dL   BUN 14  6 - 23 mg/dL   Creatinine, Ser 5.78  0.50 - 1.10 mg/dL   Calcium 9.6  8.4 - 46.9 mg/dL   Total Protein 7.9  6.0 - 8.3 g/dL   Albumin 3.7  3.5 - 5.2 g/dL   AST 15  0 - 37 U/L   ALT 11  0 - 35 U/L   Alkaline Phosphatase 113  39 - 117 U/L   Total Bilirubin 0.2 (*) 0.3 - 1.2 mg/dL   GFR calc non Af Amer 62 (*) >90 mL/min   GFR calc Af Amer 72 (*) >90 mL/min  URINE MICROSCOPIC-ADD ON      Component Value Range   Squamous Epithelial / LPF FEW (*) RARE   WBC, UA 0-2  <3 WBC/hpf   RBC / HPF TOO NUMEROUS TO COUNT  <  3 RBC/hpf   Bacteria, UA RARE  RARE   Urine-Other MUCOUS PRESENT    WET PREP, GENITAL      Component Value Range   Yeast Wet Prep HPF POC NONE SEEN   NONE SEEN   Trich, Wet Prep NONE SEEN  NONE SEEN   Clue Cells Wet Prep HPF POC FEW (*) NONE SEEN   WBC, Wet Prep HPF POC NONE SEEN  NONE SEEN   Ct Abdomen Pelvis W Contrast  05/18/2012  *RADIOLOGY REPORT*  Clinical Data: Right lower quadrant abdominal pain, nausea and vomiting; constipation.  Vaginal bleeding.  CT ABDOMEN AND PELVIS WITH CONTRAST  Technique:  Multidetector CT imaging of the abdomen and pelvis was performed following the standard protocol during bolus administration of intravenous contrast.  Contrast: OMNIPAQUE IOHEXOL 300 MG/ML  SOLN  Comparison: Abdominal radiograph performed 05/17/2012  Findings: The visualized lung bases are clear.  The liver and spleen are unremarkable in appearance.  The gallbladder is within normal limits.  The pancreas and adrenal glands are unremarkable.  The kidneys are unremarkable in appearance.  There is no evidence of hydronephrosis.  No renal or ureteral stones are seen.  No perinephric stranding is appreciated.  No free fluid is identified.  The small bowel is unremarkable in appearance.  The stomach is within normal limits.  No acute vascular abnormalities are seen.  The appendix is normal in caliber, without evidence for appendicitis.  Contrast progresses to the cecum.  The colon is unremarkable in appearance; there is no evidence to suggest constipation.  The bladder is mildly distended and grossly unremarkable in appearance.  The uterus is within normal limits; the vagina is difficult to fully assess on this study.  The ovaries are grossly unremarkable; a 2.9 cm left adnexal cystic lesion is likely physiologic in nature.  No inguinal lymphadenopathy is seen.  No acute osseous abnormalities are identified.  Mild sclerotic change is noted at the right sacroiliac joint.  IMPRESSION:  1.  No acute abnormalities seen within the abdomen or pelvis.  If the patient's vaginal bleeding persists, further evaluation could be considered as deemed clinically  appropriate. 2.  2.9 cm left adnexal cystic lesion is likely physiologic in nature.  Original Report Authenticated By: Tonia Ghent, M.D.   Dg Abd Acute W/chest  05/17/2012  *RADIOLOGY REPORT*  Clinical Data: Right lower quadrant pain  ACUTE ABDOMEN SERIES (ABDOMEN 2 VIEW & CHEST 1 VIEW)  Comparison: None.  Findings: Heart size is normal.  Negative for heart failure. Negative for infiltrate or effusion.  Normal bowel gas pattern.  Negative for bowel obstruction or free air.  No kidney stones.  Mild lumbar degenerative change at L4-5 and L5-S1.  IMPRESSION: No acute abnormality.  Original Report Authenticated By: Camelia Phenes, M.D.      1. Abnormal perimenopausal bleeding   2. Pain, female pelvic       MDM  Dysfunctional uterine bleeding due to perimenopausal status. Right lower quadrant pain of uncertain cause. I discussed with patient option of having her CT scan done as an outpatient Rx having it done tonight while in the ED. She will have a CT scan done here. I reviewed her office records and her PCP had prescribed tramadol for her which has not been picked up yet.  CT scan is unremarkable. I do not see anything that indicates she is having anything other than perimenopausal symptom. She is carcinoma prescription for Provera to help control Percocet for pain.  Dione Booze, MD 05/19/12 [phone]

## 2012-05-18 NOTE — Telephone Encounter (Signed)
She had blood in her urine so I ordered a CT scan. Tramadol Rx was sent to her pharmacy.

## 2012-05-18 NOTE — Telephone Encounter (Signed)
Pt states that she had to wear 2 sanitary napkins at a time and has soaked through them in 1.5 hrs (had to leave work because she needed to change clothes).  Pt states that she is bleeding without urinating and cramps are severe. Pt advised to go to ER ASAP. Pt agreed and will go now.

## 2012-05-18 NOTE — ED Notes (Signed)
Updated pt on wait time, pt with family member. Has no complaints at this time

## 2012-05-19 MED ORDER — OXYCODONE-ACETAMINOPHEN 5-325 MG PO TABS: 1.0000 | ORAL_TABLET | ORAL | Status: AC | PRN

## 2012-05-19 MED ORDER — MEDROXYPROGESTERONE ACETATE 10 MG PO TABS
10.0000 mg | ORAL_TABLET | Freq: Every day | ORAL | Status: DC
  Administered 2012-05-19: 10 mg via ORAL
  Filled 2012-05-19: qty 1

## 2012-05-19 MED ORDER — MEDROXYPROGESTERONE ACETATE 10 MG PO TABS: 10.0000 mg | ORAL_TABLET | Freq: Every day | ORAL | Status: DC

## 2012-05-29 ENCOUNTER — Telehealth: Payer: Self-pay

## 2012-05-29 NOTE — Telephone Encounter (Signed)
Yes, pls see GYN

## 2012-05-29 NOTE — Telephone Encounter (Signed)
Pt called stating she is still having vaginal bleeding and is requesting advisement from PCP - does she need follow up appt or GYN referral? Please advise.

## 2012-05-29 NOTE — Telephone Encounter (Signed)
Called patient to advise per MD. Per patient she does not have an OBGYN, gave her the phone number to [facility] [phone] (no referral needed)

## 2012-07-07 ENCOUNTER — Emergency Department (HOSPITAL_COMMUNITY): Payer: 59

## 2012-07-07 ENCOUNTER — Observation Stay (HOSPITAL_COMMUNITY)
Admission: EM | Admit: 2012-07-07 | Discharge: 2012-07-07 | Disposition: A | Discharge status: Home / Self Care | Payer: 59 | Attending: Emergency Medicine | Admitting: Emergency Medicine

## 2012-07-07 ENCOUNTER — Encounter (HOSPITAL_COMMUNITY): Payer: Self-pay | Admitting: *Deleted

## 2012-07-07 DIAGNOSIS — R072 Precordial pain: Secondary | ICD-10-CM

## 2012-07-07 DIAGNOSIS — R110 Nausea: Secondary | ICD-10-CM | POA: Insufficient documentation

## 2012-07-07 DIAGNOSIS — R079 Chest pain, unspecified: Principal | ICD-10-CM | POA: Insufficient documentation

## 2012-07-07 DIAGNOSIS — R0602 Shortness of breath: Secondary | ICD-10-CM | POA: Insufficient documentation

## 2012-07-07 HISTORY — DX: Personal history of other diseases of the digestive system: Z87.19

## 2012-07-07 HISTORY — DX: Personal history of peptic ulcer disease: Z87.11

## 2012-07-07 LAB — CBC
HCT: 38 % (ref 36.0–46.0)
Hemoglobin: 12.3 g/dL (ref 12.0–15.0)
MCH: 25.9 pg — ABNORMAL LOW (ref 26.0–34.0)
MCHC: 32.4 g/dL (ref 30.0–36.0)

## 2012-07-07 LAB — POCT I-STAT, CHEM 8
HCT: 41 % (ref 36.0–46.0)
Hemoglobin: 13.9 g/dL (ref 12.0–15.0)
Potassium: 3.6 mEq/L (ref 3.5–5.1)
Sodium: 140 mEq/L (ref 135–145)
TCO2: 24 mmol/L (ref 0–100)

## 2012-07-07 LAB — COMPREHENSIVE METABOLIC PANEL
Alkaline Phosphatase: 112 U/L (ref 39–117)
BUN: 8 mg/dL (ref 6–23)
CO2: 24 mEq/L (ref 19–32)
Chloride: 102 mEq/L (ref 96–112)
Creatinine, Ser: 0.94 mg/dL (ref 0.50–1.10)
GFR calc non Af Amer: 69 mL/min — ABNORMAL LOW (ref >90)
Glucose, Bld: 104 mg/dL — ABNORMAL HIGH (ref 70–99)
Potassium: 3.6 mEq/L (ref 3.5–5.1)
Total Bilirubin: 0.4 mg/dL (ref 0.3–1.2)

## 2012-07-07 LAB — POCT I-STAT TROPONIN I: Troponin i, poc: 0 ng/mL (ref 0.00–0.08)

## 2012-07-07 MED ORDER — HYDROCODONE-ACETAMINOPHEN 5-500 MG PO TABS: 1.0000 | ORAL_TABLET | Freq: Four times a day (QID) | ORAL | Status: AC | PRN

## 2012-07-07 MED ORDER — ONDANSETRON HCL 4 MG/2ML IJ SOLN
4.0000 mg | Freq: Once | INTRAMUSCULAR | Status: AC
  Administered 2012-07-07: 4 mg via INTRAVENOUS
  Filled 2012-07-07: qty 2

## 2012-07-07 MED ORDER — FAMOTIDINE IN NACL 20-0.9 MG/50ML-% IV SOLN
20.0000 mg | Freq: Once | INTRAVENOUS | Status: AC
  Administered 2012-07-07: 20 mg via INTRAVENOUS
  Filled 2012-07-07: qty 50

## 2012-07-07 MED ORDER — GI COCKTAIL ~~LOC~~
30.0000 mL | Freq: Once | ORAL | Status: AC
  Administered 2012-07-07: 30 mL via ORAL
  Filled 2012-07-07: qty 30

## 2012-07-07 MED ORDER — SODIUM CHLORIDE 0.9 % IV SOLN
1000.0000 mL | INTRAVENOUS | Status: DC
  Administered 2012-07-07: 1000 mL via INTRAVENOUS

## 2012-07-07 MED ORDER — MORPHINE SULFATE 4 MG/ML IJ SOLN: 4.0000 mg | Freq: Once | INTRAMUSCULAR | Status: DC

## 2012-07-07 MED ORDER — ASPIRIN 81 MG PO CHEW
324.0000 mg | CHEWABLE_TABLET | Freq: Once | ORAL | Status: AC
  Administered 2012-07-07: 324 mg via ORAL
  Filled 2012-07-07: qty 1

## 2012-07-07 NOTE — Progress Notes (Signed)
Observation review is complete. 

## 2012-07-07 NOTE — ED Notes (Signed)
Pt c/o onset of midsternal CP at 0500 described as constant, pressure-like , non radiating associated with SOB  And nausea.  Had similar pain for three hours on 8.29.13 but not as painful.  States she has had this pain every morning for the past week lasting about one minute but this morning it did not go away

## 2012-07-07 NOTE — Progress Notes (Signed)
  Echocardiogram Echocardiogram Stress Test has been performed.  [name] 07/07/2012, 11:45 AM

## 2012-07-07 NOTE — ED Notes (Signed)
New EKG given to Dr Opitz. No old EKG. 

## 2012-07-07 NOTE — ED Notes (Signed)
Transported to xray 

## 2012-07-07 NOTE — ED Notes (Signed)
Report given to Audrey, RN.

## 2012-07-07 NOTE — ED Provider Notes (Signed)
  Patient placed in the CDU on chest pain protocol. Patient presented to the emergency department with chest pain as a normal EKG and negative troponin. Do to the quality of pain the patient's lack of risk factors, DR. Rhunette Croft and [place], PA-C, but the patient was a good candidate for the chest pain protocol to be ruled out for pathology for cardiac disease. The patient stress echo came back normal with no wall motion abnormality. The patient remained pain-free while in the CDU with no adverse events. Patient will be discharged at this time with a referral to her primary care doctor, and a cardiologist she requests 1.  Pt has been advised of the symptoms that warrant their return to the ED. Patient has voiced understanding and has agreed to follow-up with the PCP or specialist.   [facility]* *[facility]* [address] [place], [place] [address] [phone]  ------------------------------------------------------------ Stress Echocardiography  Patient: [name], [name] MR #: [id number] Study Date: 07/07/2012 Gender: F Age: 51 Height: 154.9cm Weight: 106.8kg BSA: 2.59m^2 Pt. Status: Room: B14C  PERFORMING [place], Hospital SONOGRAPHER [facility], RDCS ORDERING [place], [place] cc:  ------------------------------------------------------------  ------------------------------------------------------------ Indications: Chest pain 786.51.  ------------------------------------------------------------ Study Conclusions  - Stress ECG conclusions: There were no stress arrhythmias or conduction abnormalities. The stress ECG was negative for ischemia. - Staged echo: There was no echocardiographic evidence for stress-induced ischemia. - Impressions: Baseline ECG appears to show LVH with secondary ST/T wave changes Impressions:  - Baseline ECG appears to show LVH with secondary ST/T wave changes Bruce protocol. Stress echocardiography. Height:  Height: 154.9cm. Height: 61in. Weight: Weight: 106.8kg. Weight: 235lb. Body mass index: BMI: 44.5kg/m^2. Body surface area: BSA: 2.57m^2. Blood pressure: 156/85. Patient status: Observation.  ------------------------------------------------------------  ------------------------------------------------------------ Baseline ECG: Normal.  ------------------------------------------------------------ Stress protocol:  +---------------+---+------------+--------+ Stage HR BP (mmHg) Symptoms +---------------+---+------------+--------+ Baseline 66 156/85 (109)None  +---------------+---+------------+--------+ Stage 1 141-------------------- +---------------+---+------------+--------+ Stage 2 146-------------------- +---------------+---+------------+--------+ Recovery; 1 [id number] (106)-------- +---------------+---+------------+--------+ Recovery; 2 min90 -------------------- +---------------+---+------------+--------+ Recovery; 3 min79 149/69 (96) -------- +---------------+---+------------+--------+ Recovery; 4 min83 -------------------- +---------------+---+------------+--------+ Recovery; 5 min75 139/69 (92) -------- +---------------+---+------------+--------+  ------------------------------------------------------------ Stress results: Maximal heart rate during stress was 146bpm (88% of maximal predicted heart rate). The maximal predicted heart rate was 150bpm.The target heart rate was achieved. The heart rate response to stress was normal. There was a normal resting blood pressure with an appropriate response to stress. The rate-pressure product for the peak heart rate and blood pressure was Hg/min. The patient experienced no chest pain during stress.  ------------------------------------------------------------ Stress ECG: There were no stress arrhythmias or conduction abnormalities. The stress ECG was negative for  ischemia.  ------------------------------------------------------------ Baseline:  - LV size was normal. - Normal wall motion; no LV regional wall motion abnormalities. Peak stress:  ------------------------------------------------------------ Stress echo results: Left ventricular ejection fraction was normal at rest and with stress. There was no echocardiographic evidence for stress-induced ischemia.  ------------------------------------------------------------ Prepared and Electronically Authenticated by  Charlton Haws 2013-08-30T12:08:16.533   Dorthula Matas, PA 07/07/12 1312

## 2012-07-07 NOTE — ED Provider Notes (Signed)
Medical screening examination/treatment/procedure(s) were conducted as a shared visit with non-physician practitioner(s) and myself.  I personally evaluated the patient during the encounter  Derwood Kaplan, MD 07/07/12 [phone]

## 2012-07-07 NOTE — ED Notes (Signed)
Family at bedside. 

## 2012-07-07 NOTE — ED Provider Notes (Signed)
History     CSN: [id number]  Arrival date & time 07/07/12  0622   First MD Initiated Contact with Patient 07/07/12 [phone]      Chief Complaint  Patient presents with  . Chest Pain    (Consider location/radiation/quality/duration/timing/severity/associated sxs/prior treatment) HPI  [name] is a 51 y.o. female in no acute distress complaining of substernal chest tightness onset at 5 AM (1 hour 45 minutes ago) severity is 8/10, it is nonradiating, constant, exertional and exacerbated by lying supine, associated with nausea and shortness of breath. Patient has had similar episodes in the past but never this severe. She states that she normally has it when she wakes up. Pain does not wake her from sleep. She had an episode yesterday lasting 3 hours. Last stress test was one year ago. She does not have a cardiologist or history of coronary artery disease. States that this does not feel like an asthma exacerbation. She denies fever, cough, wheezing, abdominal pain, vomiting, palpitations, headache. Patient has had a history of gastric ulcers. She has been taking BC powder once a day for the last 7 days for headaches.    Past Medical History  Diagnosis Date  . Allergy     rhinitis  . Asthma   . Headache   . Anemia   . History of stomach ulcers     Past Surgical History  Procedure Date  . Tubal ligation     Family History  Problem Relation Age of Onset  . Arthritis Other   . Diabetes Other   . Hypertension Other     History  Substance Use Topics  . Smoking status: Never Smoker   . Smokeless tobacco: Not on file  . Alcohol Use: No    OB History    Grav Para Term Preterm Abortions TAB SAB Ect Mult Living                  Review of Systems  Constitutional: Negative for fever.  Respiratory: Positive for shortness of breath. Negative for cough.   Cardiovascular: Positive for chest pain. Negative for palpitations.  Musculoskeletal: Negative for back pain.  All  other systems reviewed and are negative.    Allergies  Review of patient's allergies indicates no known allergies.  Home Medications   Current Outpatient Rx  Name Route Sig Dispense Refill  . DESONIDE 0.05 % EX LOTN Topical Apply 1 application topically daily.    Marland Kitchen MEDROXYPROGESTERONE ACETATE 10 MG PO TABS Oral Take 1 tablet (10 mg total) by mouth daily. 5 tablet 0  . MOMETASONE FUROATE 50 MCG/ACT NA SUSP Nasal Place 2 sprays into the nose daily as needed. For allergies    . OLOPATADINE HCL 0.2 % OP SOLN Ophthalmic Apply 1 drop to eye daily. For 10 days for allergic conjunctivitis    . POLYETHYLENE GLYCOL 3350 PO POWD Oral Take 17 g by mouth daily as needed. For constipation      BP 156/85  Pulse 75  Temp 98.4 F (36.9 C) (Oral)  Resp 15  Ht 5' 1.5" (1.562 m)  Wt 235 lb (106.595 kg)  BMI 43.68 kg/m2  SpO2 99%  Physical Exam  Nursing note and vitals reviewed. Constitutional: She is oriented to person, place, and time. She appears well-developed and well-nourished. No distress.  HENT:  Head: Normocephalic.  Eyes: Conjunctivae and EOM are normal. Pupils are equal, round, and reactive to light.  Neck: Normal range of motion. No JVD present.  Cardiovascular: Normal rate  and regular rhythm.  Exam reveals no gallop and no friction rub.   No murmur heard. Pulmonary/Chest: Effort normal and breath sounds normal. No stridor. She has no wheezes.  Abdominal: Soft. Bowel sounds are normal.  Musculoskeletal: Normal range of motion. She exhibits no edema and no tenderness.  Neurological: She is alert and oriented to person, place, and time.  Psychiatric: She has a normal mood and affect.    ED Course  Procedures (including critical care time)  Labs Reviewed  CBC - Abnormal; Notable for the following:    MCH 25.9 (*)     RDW 15.7 (*)     All other components within normal limits  COMPREHENSIVE METABOLIC PANEL - Abnormal; Notable for the following:    Glucose, Bld 104 (*)     GFR  calc non Af Amer 69 (*)     GFR calc Af Amer 80 (*)     All other components within normal limits  POCT I-STAT, CHEM 8  POCT I-STAT TROPONIN I  URINALYSIS, ROUTINE W REFLEX MICROSCOPIC   Dg Chest 2 View  07/07/2012  *RADIOLOGY REPORT*  Clinical Data: Chest pain.  Shortness of breath.  Nausea.  CHEST - 2 VIEW  Comparison: 10/20/2011  Findings: Cardiac and mediastinal contours appear normal.  The lungs appear clear.  No pleural effusion is identified.  Mild thoracic spondylosis noted.  IMPRESSION: 1.  Mild thoracic spondylosis.  No acute findings.   Original Report Authenticated By: Dellia Cloud, M.D.      Date: 07/07/2012  Rate: 68  Rhythm: normal sinus rhythm  QRS Axis: normal  Intervals: normal  ST/T Wave abnormalities: nonspecific T wave changes  Conduction Disutrbances:none  Narrative Interpretation:   Old EKG Reviewed: none available   1. Chest pain       MDM  dDx ACS Pericarditis GERD PE Asthma  51 y.o. Female with no cardiac risk factors c/o substernal chest tightness x2 hours.   EKG shows no ST elevation, however there are T wave inversions and there are no prior EKGs to compare. Troponins are normal and chest x-ray shows no acute findings. Patient is not anemic and there are no electrolyte abnormalities.  8:00 AM substernal chest pain is now 5/10 after no interventions other than aspirin. Nausea and shortness of breath and now resolved.   Patient is low risk by Wells criteria for PE she is TIMI score 0, however considering the exertional nature of her pain I feel a CDU chest pain protocol workup is in order. Due to her BMI she is not eligible for coronary CT there are spots available for stress echo today. Patient will be moved to CDU. Verbal report given to PA Neva Seat.    Wynetta Emery, PA-C 07/08/12 [phone]

## 2012-07-07 NOTE — ED Notes (Signed)
Pt with hx of stomach ulcers to ED c/o intermittent chest pain since yesterday am. She states pain improved after drinking water yesterday.  Pt c/o sob and nausea, but is non-diaphoretic.

## 2012-07-08 NOTE — ED Provider Notes (Signed)
Medical screening examination/treatment/procedure(s) were conducted as a shared visit with non-physician practitioner(s) and myself.  I personally evaluated the patient during the encounter  Derwood Kaplan, MD 07/08/12 1115

## 2012-07-12 ENCOUNTER — Telehealth: Payer: Self-pay | Admitting: Internal Medicine

## 2012-07-12 ENCOUNTER — Telehealth: Payer: Self-pay | Admitting: *Deleted

## 2012-07-12 NOTE — Telephone Encounter (Signed)
Pt called requesting appointment with Dr. Yetta Barre, she states that she had been in the ER for chest pain and wanted to schedule an appointment, callback pt to get more information, no answer-left message for pt to callback office to schedule an appointment if needed.

## 2012-07-12 NOTE — Telephone Encounter (Signed)
Caller: [name]/Patient; Patient Name: [name]; PCP: Sanda Linger (Adults only); Best Callback Phone Number: [phone]  07-12-12 is calling to schedule an Emergency room follow up appointment  She was seen for chest pain.  Negative workup and was told was stress.  I advised her to call appointment line tomorrow to schedule because she wants to make appointment for Friday afternoon.

## 2012-08-28 ENCOUNTER — Telehealth: Payer: Self-pay | Admitting: Internal Medicine

## 2012-08-28 NOTE — Telephone Encounter (Signed)
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 She has  pain in her chest and Carple Tunnel pain on and off every day- chest pain worse with cough and movement. Seen in ER for chest pain one month ago and checked out fine. Tried Alleve and it did not help. Daughter gave Tramadol 1 x and it helped. When she finishes work she has trouble gettig out of the care and walking upright. Triage per Back Symptoms Protocol and appointment advised within 24 hours for "back pain radiates into thigh or below knee". Appointment scheduled for 08/29/12 at 1600.

## 2012-08-29 ENCOUNTER — Ambulatory Visit (INDEPENDENT_AMBULATORY_CARE_PROVIDER_SITE_OTHER): Payer: 59 | Admitting: Internal Medicine

## 2012-08-29 ENCOUNTER — Telehealth: Payer: Self-pay | Admitting: Internal Medicine

## 2012-08-29 ENCOUNTER — Encounter: Payer: Self-pay | Admitting: Internal Medicine

## 2012-08-29 VITALS — BP 110/70 | HR 65 | Temp 98.1°F | Ht 61.5 in | Wt 216.4 lb

## 2012-08-29 DIAGNOSIS — IMO0002 Reserved for concepts with insufficient information to code with codable children: Secondary | ICD-10-CM

## 2012-08-29 DIAGNOSIS — M5441 Lumbago with sciatica, right side: Secondary | ICD-10-CM | POA: Insufficient documentation

## 2012-08-29 DIAGNOSIS — M5416 Radiculopathy, lumbar region: Secondary | ICD-10-CM

## 2012-08-29 DIAGNOSIS — M545 Low back pain: Secondary | ICD-10-CM

## 2012-08-29 MED ORDER — TRAMADOL HCL 50 MG PO TABS: 50.0000 mg | ORAL_TABLET | Freq: Four times a day (QID) | ORAL | Status: DC | PRN

## 2012-08-29 MED ORDER — CYCLOBENZAPRINE HCL 5 MG PO TABS: 5.0000 mg | ORAL_TABLET | Freq: Three times a day (TID) | ORAL | Status: DC | PRN

## 2012-08-29 MED ORDER — PREDNISONE 10 MG PO TABS: ORAL_TABLET | ORAL | Status: DC

## 2012-08-29 NOTE — Telephone Encounter (Signed)
Chest pain w/cough only onset 08/22/12. Pt is calling to see if there is an earlier appt. Advised to keep her appt for 1600 today with Dr. Melvyn Novas. Cough Protocol.

## 2012-08-29 NOTE — Patient Instructions (Addendum)
Take all new medications as prescribed Continue all other medications as before You are given the work note today  

## 2012-09-03 ENCOUNTER — Encounter: Payer: Self-pay | Admitting: Internal Medicine

## 2012-09-03 NOTE — Assessment & Plan Note (Signed)
C/w msk strain most likely  - for med tx,  to f/u any worsening symptoms or concerns

## 2012-09-03 NOTE — Progress Notes (Signed)
  Subjective:    Patient ID: [name], female    DOB: [birth year], 51 y.o.   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  Pain worse now to stand and bend in place, better to walk about. Past Medical History  Diagnosis Date  . Allergy     rhinitis  . Asthma   . Headache   . Anemia   . History of stomach ulcers    Past Surgical History  Procedure Date  . Tubal ligation     reports that she has never smoked. She does not have any smokeless tobacco history on file. She reports that she does not drink alcohol or use illicit drugs. family history includes Arthritis in her other; Diabetes in her other; and Hypertension in her other. No Known Allergies No current outpatient prescriptions on file prior to visit.   Review of Systems  Constitutional: Negative for diaphoresis and unexpected weight change.  HENT: Negative for tinnitus.   Eyes: Negative for photophobia and visual disturbance.  Respiratory: Negative for choking and stridor.   Gastrointestinal: Negative for vomiting and blood in stool.  Genitourinary: Negative for hematuria and decreased urine volume.  Musculoskeletal: Negative for gait problem.  Skin: Negative for color change and wound.  Neurological: Negative for tremors and numbness.  Psychiatric/Behavioral: Negative for decreased concentration. The patient is not hyperactive.       Objective:   Physical Exam BP 110/70  Pulse 65  Temp 98.1 F (36.7 C) (Oral)  Ht 5' 1.5" (1.562 m)  Wt 216 lb 6 oz (98.147 kg)  BMI 40.22 kg/m2  SpO2 97% Physical Exam  VS noted Constitutional: Pt appears well-developed and well-nourished.  HENT: Head: Normocephalic.    Right Ear: External ear normal.  Left Ear: External ear normal.  Eyes: Conjunctivae and EOM are normal. Pupils are equal, round, and reactive to light.  Neck: Normal range of motion. Neck supple.  Cardiovascular: Normal rate and regular rhythm.   Pulmonary/Chest: Effort normal and breath sounds normal.  Abd:  Soft, NT, non-distended, + BS Spine: nontender, but has bilat lumbar paravertebral spasm/tender Neurological: Pt is alert. Not confused, motor/sens/dtr intact except 4+/5 distal RLE weakness - not new per pt  Skin: Skin is warm. No erythema.  Psychiatric: Pt behavior is normal. Thought content normal.     Assessment & Plan:

## 2012-09-03 NOTE — Assessment & Plan Note (Signed)
stable overall by hx and exam, and pt to continue medical treatment as before 

## 2012-09-27 ENCOUNTER — Ambulatory Visit (INDEPENDENT_AMBULATORY_CARE_PROVIDER_SITE_OTHER): Payer: 59 | Admitting: Internal Medicine

## 2012-09-27 ENCOUNTER — Encounter: Payer: Self-pay | Admitting: Internal Medicine

## 2012-09-27 ENCOUNTER — Ambulatory Visit (INDEPENDENT_AMBULATORY_CARE_PROVIDER_SITE_OTHER)
Admission: RE | Admit: 2012-09-27 | Discharge: 2012-09-27 | Disposition: A | Discharge status: Home / Self Care | Payer: 59 | Source: Ambulatory Visit | Attending: Internal Medicine | Admitting: Internal Medicine

## 2012-09-27 VITALS — BP 132/84 | HR 65 | Temp 98.6°F | Resp 16 | Ht 61.5 in | Wt 213.8 lb

## 2012-09-27 DIAGNOSIS — Z23 Encounter for immunization: Secondary | ICD-10-CM

## 2012-09-27 DIAGNOSIS — J209 Acute bronchitis, unspecified: Secondary | ICD-10-CM

## 2012-09-27 DIAGNOSIS — R05 Cough: Secondary | ICD-10-CM | POA: Insufficient documentation

## 2012-09-27 MED ORDER — AZITHROMYCIN 500 MG PO TABS: 500.0000 mg | ORAL_TABLET | Freq: Every day | ORAL | Status: DC

## 2012-09-27 MED ORDER — HYDROCODONE-HOMATROPINE 5-1.5 MG/5ML PO SYRP: 5.0000 mL | ORAL_SOLUTION | Freq: Three times a day (TID) | ORAL | Status: DC | PRN

## 2012-09-28 NOTE — Assessment & Plan Note (Signed)
Will start Zpak for the infection and a cough suppressant 

## 2012-09-28 NOTE — Progress Notes (Signed)
Subjective:    Patient ID: [name], female    DOB: 09-26-1961, 51 y.o.   MRN: [id number]  Cough This is a new problem. The current episode started in the past 7 days. The problem has been gradually worsening. The problem occurs every few hours. The cough is productive of purulent sputum. Associated symptoms include chills and a sore throat. Pertinent negatives include no chest pain, ear congestion, ear pain, fever, headaches, heartburn, hemoptysis, myalgias, nasal congestion, postnasal drip, rash, rhinorrhea, shortness of breath, sweats, weight loss or wheezing. Nothing aggravates the symptoms. She has tried OTC cough suppressant for the symptoms. The treatment provided mild relief. Her past medical history is significant for asthma and bronchitis.      Review of Systems  Constitutional: Positive for chills. Negative for fever, weight loss, diaphoresis, activity change, appetite change, fatigue and unexpected weight change.  HENT: Positive for sore throat. Negative for ear pain, nosebleeds, congestion, rhinorrhea, sneezing, trouble swallowing, postnasal drip and sinus pressure.   Eyes: Negative.   Respiratory: Positive for cough. Negative for apnea, hemoptysis, choking, chest tightness, shortness of breath, wheezing and stridor.   Cardiovascular: Negative for chest pain, palpitations and leg swelling.  Gastrointestinal: Negative for heartburn, nausea, vomiting, abdominal pain, diarrhea and constipation.  Genitourinary: Negative.   Musculoskeletal: Negative for myalgias, back pain, joint swelling, arthralgias and gait problem.  Skin: Negative for color change, pallor, rash and wound.  Neurological: Negative.  Negative for headaches.  Hematological: Negative for adenopathy. Does not bruise/bleed easily.  Psychiatric/Behavioral: Negative.        Objective:   Physical Exam  Constitutional: She is oriented to person, place, and time. She appears well-developed and well-nourished.   Non-toxic appearance. She does not have a sickly appearance. She does not appear ill. No distress.  HENT:  Head: Normocephalic and atraumatic.  Mouth/Throat: Oropharynx is clear and moist. No oropharyngeal exudate.  Eyes: Conjunctivae normal are normal. Right eye exhibits no discharge. Left eye exhibits no discharge. No scleral icterus.  Neck: Normal range of motion. Neck supple. No JVD present. No tracheal deviation present. No thyromegaly present.  Cardiovascular: Normal rate, regular rhythm, normal heart sounds and intact distal pulses.  Exam reveals no gallop and no friction rub.   No murmur heard. Pulmonary/Chest: Effort normal and breath sounds normal. No stridor. No respiratory distress. She has no wheezes. She has no rales. She exhibits no tenderness.  Abdominal: Soft. Bowel sounds are normal. She exhibits no distension and no mass. There is no tenderness. There is no rebound and no guarding.  Musculoskeletal: Normal range of motion. She exhibits no edema and no tenderness.  Lymphadenopathy:    She has no cervical adenopathy.  Neurological: She is oriented to person, place, and time.  Skin: Skin is warm and dry. No rash noted. She is not diaphoretic. No erythema. No pallor.  Psychiatric: She has a normal mood and affect. Her behavior is normal. Judgment and thought content normal.     Lab Results  Component Value Date   WBC 5.5 07/07/2012   HGB 13.9 07/07/2012   HCT 41.0 07/07/2012   PLT 271 07/07/2012   GLUCOSE 97 07/07/2012   CHOL 186 01/12/2011   TRIG 102.0 01/12/2011   HDL 41.00 01/12/2011   LDLCALC 125* 01/12/2011   ALT 8 07/07/2012   AST 16 07/07/2012   NA 140 07/07/2012   K 3.6 07/07/2012   CL 104 07/07/2012   CREATININE 0.90 07/07/2012   BUN 6 07/07/2012   CO2 24  07/07/2012   TSH 0.94 11/15/2011       Assessment & Plan:

## 2012-09-28 NOTE — Assessment & Plan Note (Signed)
I will check her CXR to see if there is any evidence of pneumonia

## 2012-10-19 ENCOUNTER — Ambulatory Visit: Payer: 59 | Admitting: Internal Medicine

## 2012-10-19 ENCOUNTER — Telehealth: Payer: Self-pay

## 2012-10-19 NOTE — Telephone Encounter (Signed)
Patient request refill on tramadol. Please advise if ok to refill

## 2012-10-20 ENCOUNTER — Telehealth: Payer: Self-pay | Admitting: *Deleted

## 2012-10-20 MED ORDER — TRAMADOL HCL 50 MG PO TABS: 50.0000 mg | ORAL_TABLET | Freq: Four times a day (QID) | ORAL | Status: DC | PRN

## 2012-10-20 NOTE — Telephone Encounter (Signed)
Left msg on triage wanting to know has md approve her tramadol rx. Pls advise...Raechel Chute

## 2012-10-20 NOTE — Telephone Encounter (Signed)
Notified pt rx sent to pharmacy../[facility] 

## 2012-10-20 NOTE — Telephone Encounter (Signed)
yes

## 2012-11-13 ENCOUNTER — Ambulatory Visit: Payer: 59 | Admitting: Internal Medicine

## 2012-11-13 DIAGNOSIS — Z0289 Encounter for other administrative examinations: Secondary | ICD-10-CM

## 2012-12-06 ENCOUNTER — Other Ambulatory Visit: Payer: Self-pay | Admitting: *Deleted

## 2012-12-06 DIAGNOSIS — M5416 Radiculopathy, lumbar region: Secondary | ICD-10-CM

## 2012-12-06 DIAGNOSIS — G5601 Carpal tunnel syndrome, right upper limb: Secondary | ICD-10-CM

## 2012-12-06 MED ORDER — TRAMADOL HCL 50 MG PO TABS: 50.0000 mg | ORAL_TABLET | Freq: Four times a day (QID) | ORAL | Status: DC | PRN

## 2012-12-06 NOTE — Telephone Encounter (Signed)
Pt informed rx has been sent.  

## 2012-12-06 NOTE — Telephone Encounter (Signed)
done

## 2012-12-06 NOTE — Telephone Encounter (Signed)
Pt is requesting refill of Tramadol. Okay to refill?

## 2013-02-05 ENCOUNTER — Telehealth: Payer: Self-pay | Admitting: Internal Medicine

## 2013-02-05 DIAGNOSIS — G5601 Carpal tunnel syndrome, right upper limb: Secondary | ICD-10-CM

## 2013-02-05 DIAGNOSIS — M5416 Radiculopathy, lumbar region: Secondary | ICD-10-CM

## 2013-02-05 NOTE — Telephone Encounter (Signed)
Patient Information:  Caller Name: Kamy  Phone: [phone]  Patient: [name], [name]  Gender: Female  DOB: [birth year]  Age: 52 Years  PCP: Sanda Linger (Adults only)  Pregnant: No  Office Follow Up:  Does the office need to follow up with this patient?: Yes  Instructions For The Office: Patient requests refill for Tramadol 50mg . q 6 hours as needed for pain. Patient ran out of Tramadol 02/04/13.  Patient uses [facility] on [address] at [phone]. Patient can be reached at [phone].  RN Note:  Patient is calling requesting a refill for Tramadol 50mg . q 6 hours. 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 Patient denies any new sx or injury. Patient states she ran out of Tramadol 02/04/13. Patient states she did call her Pharmacy to submit a refill request. Patient uses [facility] on [address] at [phone]. Patient can be reached at [phone]. Patient denies numbness or tingling at present. Patient states Tramadol is effective for pain. Care advice given per given per guidelines. Call back parameters reviewed. Patient verbalizes understanding.  Symptoms  Reason For Call & Symptoms: Requesting refill fro Tramadol for chromnic back pain  Reviewed Health History In EMR: Yes  Reviewed Medications In EMR: Yes  Reviewed Allergies In EMR: Yes  Reviewed Surgeries / Procedures: Yes  Date of Onset of Symptoms: 02/05/2013 OB / GYN:  LMP: Unknown  Guideline(s) Used:  Back Pain  Disposition Per Guideline:   See Within 2 Weeks in Office  Reason For Disposition Reached:   Back pain is a chronic symptom (recurrent or ongoing AND lasting > 4 weeks)  Advice Given:  Cold or Heat:  Cold Pack: For pain or swelling, use a cold pack or ice wrapped in a wet cloth. Put it on the sore area for 20 minutes. Repeat 4 times on the first day, then as needed.  Heat Pack: If pain lasts over 2 days, apply heat to the  sore area. Use a heat pack, heating pad, or warm wet washcloth. Do this for 10 minutes, then as needed. For widespread stiffness, take a hot bath or hot shower instead. Move the sore area under the warm water.  Good Body Mechanics:  Lifting: Stand close to the object to be lifted. Keep your back straight and lift by bending your legs. Ask for lifting help if needed.  Call Back If:  You have more questions  You become worse.  Patient Refused Recommendation:  Patient Requests Prescription  Patient requests refill for Tramadol 50mg . q 6 hours as needed for pain. Patient ran out of Tramadol 02/04/13.  Patient uses [facility] on [address] at [phone]. Patient can be reached at [phone].

## 2013-02-06 MED ORDER — TRAMADOL HCL 50 MG PO TABS: 50.0000 mg | ORAL_TABLET | Freq: Four times a day (QID) | ORAL | Status: DC | PRN

## 2013-02-06 NOTE — Telephone Encounter (Signed)
done

## 2013-03-16 ENCOUNTER — Telehealth: Payer: Self-pay

## 2013-03-16 MED ORDER — DESONIDE 0.05 % EX LOTN: TOPICAL_LOTION | Freq: Two times a day (BID) | CUTANEOUS | Status: DC

## 2013-03-16 NOTE — Telephone Encounter (Signed)
Pt calls in requesting her Desonide lotion be refilled to [facility] on [address].

## 2013-05-30 ENCOUNTER — Telehealth: Payer: Self-pay | Admitting: *Deleted

## 2013-05-30 NOTE — Telephone Encounter (Signed)
Pt called requesting refills on her Tramadol Rx.  Pt advised she should have at least 2 more refills on Rx written in April, 2014.  Pt is calling pharmacy.

## 2013-06-01 ENCOUNTER — Ambulatory Visit (INDEPENDENT_AMBULATORY_CARE_PROVIDER_SITE_OTHER): Payer: 59 | Admitting: Internal Medicine

## 2013-06-01 ENCOUNTER — Encounter: Payer: Self-pay | Admitting: Internal Medicine

## 2013-06-01 ENCOUNTER — Other Ambulatory Visit (INDEPENDENT_AMBULATORY_CARE_PROVIDER_SITE_OTHER): Payer: 59

## 2013-06-01 VITALS — BP 110/72 | HR 64 | Temp 97.8°F | Resp 16 | Ht 62.0 in | Wt 204.0 lb

## 2013-06-01 DIAGNOSIS — Z1231 Encounter for screening mammogram for malignant neoplasm of breast: Secondary | ICD-10-CM

## 2013-06-01 DIAGNOSIS — L259 Unspecified contact dermatitis, unspecified cause: Secondary | ICD-10-CM

## 2013-06-01 DIAGNOSIS — M5416 Radiculopathy, lumbar region: Secondary | ICD-10-CM

## 2013-06-01 DIAGNOSIS — J309 Allergic rhinitis, unspecified: Secondary | ICD-10-CM

## 2013-06-01 DIAGNOSIS — Z0000 Encounter for general adult medical examination without abnormal findings: Secondary | ICD-10-CM

## 2013-06-01 DIAGNOSIS — IMO0002 Reserved for concepts with insufficient information to code with codable children: Secondary | ICD-10-CM

## 2013-06-01 DIAGNOSIS — G5601 Carpal tunnel syndrome, right upper limb: Secondary | ICD-10-CM

## 2013-06-01 DIAGNOSIS — L309 Dermatitis, unspecified: Secondary | ICD-10-CM | POA: Insufficient documentation

## 2013-06-01 LAB — URINALYSIS, ROUTINE W REFLEX MICROSCOPIC
Ketones, ur: NEGATIVE
Leukocytes, UA: NEGATIVE
Nitrite: NEGATIVE
Specific Gravity, Urine: >=1.03 (ref 1.000–1.030)
Urobilinogen, UA: 1 (ref 0.0–1.0)
pH: 5.5 (ref 5.0–8.0)

## 2013-06-01 LAB — LIPID PANEL
Cholesterol: 198 mg/dL (ref 0–200)
LDL Cholesterol: 137 mg/dL — ABNORMAL HIGH (ref 0–99)
Total CHOL/HDL Ratio: 4
Triglycerides: 52 mg/dL (ref 0.0–149.0)
VLDL: 10.4 mg/dL (ref 0.0–40.0)

## 2013-06-01 LAB — COMPREHENSIVE METABOLIC PANEL
ALT: 19 U/L (ref 0–35)
AST: 17 U/L (ref 0–37)
Alkaline Phosphatase: 126 U/L — ABNORMAL HIGH (ref 39–117)
BUN: 13 mg/dL (ref 6–23)
Creatinine, Ser: 1.1 mg/dL (ref 0.4–1.2)
Total Bilirubin: 0.3 mg/dL (ref 0.3–1.2)

## 2013-06-01 LAB — CBC WITH DIFFERENTIAL/PLATELET
Basophils Relative: 0.7 % (ref 0.0–3.0)
Eosinophils Absolute: 0.1 10*3/uL (ref 0.0–0.7)
HCT: 39.9 % (ref 36.0–46.0)
Hemoglobin: 12.9 g/dL (ref 12.0–15.0)
Lymphocytes Relative: 50.9 % — ABNORMAL HIGH (ref 12.0–46.0)
Lymphs Abs: 3 10*3/uL (ref 0.7–4.0)
MCHC: 32.4 g/dL (ref 30.0–36.0)
MCV: 81.6 fl (ref 78.0–100.0)
Monocytes Absolute: 0.4 10*3/uL (ref 0.1–1.0)
Neutro Abs: 2.3 10*3/uL (ref 1.4–7.7)
RBC: 4.89 Mil/uL (ref 3.87–5.11)
RDW: 16.5 % — ABNORMAL HIGH (ref 11.5–14.6)

## 2013-06-01 LAB — TSH: TSH: 1 u[IU]/mL (ref 0.35–5.50)

## 2013-06-01 MED ORDER — TRIAMCINOLONE ACETONIDE 0.5 % EX CREA: TOPICAL_CREAM | Freq: Three times a day (TID) | CUTANEOUS | Status: DC

## 2013-06-01 MED ORDER — MOMETASONE FUROATE 50 MCG/ACT NA SUSP: 4.0000 | Freq: Every day | NASAL | Status: DC

## 2013-06-01 MED ORDER — TRAMADOL HCL 50 MG PO TABS: 50.0000 mg | ORAL_TABLET | Freq: Three times a day (TID) | ORAL | Status: DC | PRN

## 2013-06-01 NOTE — Progress Notes (Signed)
Subjective:    Patient ID: [name], female    DOB: November 26, 1960, 52 y.o.   MRN: [id number]  Rash This is a new problem. The current episode started 1 to 4 weeks ago. The problem is unchanged. The affected locations include the torso. The rash is characterized by dryness and itchiness. She was exposed to nothing. Associated symptoms include congestion and rhinorrhea. Pertinent negatives include no anorexia, cough, diarrhea, eye pain, facial edema, fatigue, fever, joint pain, nail changes, shortness of breath, sore throat or vomiting. Past treatments include nothing. The treatment provided no relief. Her past medical history is significant for allergies and asthma. There is no history of eczema or varicella.      Review of Systems  Constitutional: Negative.  Negative for fever, chills, diaphoresis, activity change, appetite change, fatigue and unexpected weight change.  HENT: Positive for congestion, rhinorrhea and postnasal drip. Negative for hearing loss, nosebleeds, sore throat, sneezing, trouble swallowing, sinus pressure, tinnitus and ear discharge.   Eyes: Negative.  Negative for pain.  Respiratory: Negative.  Negative for apnea, cough, choking, chest tightness, shortness of breath, wheezing and stridor.   Cardiovascular: Negative.  Negative for chest pain, palpitations and leg swelling.  Gastrointestinal: Negative.  Negative for nausea, vomiting, abdominal pain, diarrhea, constipation, blood in stool and anorexia.  Endocrine: Negative.   Genitourinary: Negative.   Musculoskeletal: Positive for back pain (chronic,unchanged). Negative for myalgias, joint pain, joint swelling, arthralgias and gait problem.  Skin: Positive for rash. Negative for nail changes, color change, pallor and wound.  Allergic/Immunologic: Negative.   Neurological: Negative.   Hematological: Negative.   Psychiatric/Behavioral: Negative.        Objective:   Physical Exam  Vitals reviewed. Constitutional:  She is oriented to person, place, and time. She appears well-developed and well-nourished. No distress.  HENT:  Head: Normocephalic and atraumatic. No trismus in the jaw.  Right Ear: Hearing, tympanic membrane, external ear and ear canal normal.  Left Ear: Hearing, tympanic membrane, external ear and ear canal normal.  Nose: Rhinorrhea present. No nose lacerations, sinus tenderness, nasal deformity, septal deviation or nasal septal hematoma. No epistaxis.  No foreign bodies. Right sinus exhibits no maxillary sinus tenderness and no frontal sinus tenderness. Left sinus exhibits no maxillary sinus tenderness and no frontal sinus tenderness.  Mouth/Throat: Oropharynx is clear and moist and mucous membranes are normal. Mucous membranes are not pale, not dry and not cyanotic. No oral lesions. No edematous. No oropharyngeal exudate, posterior oropharyngeal edema, posterior oropharyngeal erythema or tonsillar abscesses.  Eyes: Conjunctivae are normal. Right eye exhibits no discharge. Left eye exhibits no discharge. No scleral icterus.  Neck: Normal range of motion. Neck supple. No JVD present. No tracheal deviation present. No thyromegaly present.  Cardiovascular: Normal rate, regular rhythm, normal heart sounds and intact distal pulses.  Exam reveals no gallop and no friction rub.   No murmur heard. Pulmonary/Chest: Effort normal and breath sounds normal. No stridor. No respiratory distress. She has no decreased breath sounds. She has no wheezes. She has no rhonchi. She has no rales. She exhibits no mass, no tenderness, no edema, no deformity and no swelling. Right breast exhibits no inverted nipple, no mass, no nipple discharge, no skin change and no tenderness. Left breast exhibits no inverted nipple, no mass, no nipple discharge, no skin change and no tenderness. Breasts are symmetrical.  Abdominal: Soft. Bowel sounds are normal. She exhibits no distension and no mass. There is no tenderness. There is no  rebound and no  guarding.  Musculoskeletal: Normal range of motion. She exhibits no edema and no tenderness.       Lumbar back: Normal. She exhibits normal range of motion, no tenderness, no bony tenderness, no swelling, no edema, no deformity, no laceration, no pain, no spasm and normal pulse.  Lymphadenopathy:    She has no cervical adenopathy.  Neurological: She is alert and oriented to person, place, and time. She has normal strength. She displays no atrophy, no tremor and normal reflexes. No cranial nerve deficit or sensory deficit. She exhibits normal muscle tone. She displays a negative Romberg sign. She displays no seizure activity. Coordination and gait normal. She displays no Babinski's sign on the right side. She displays no Babinski's sign on the left side.  Reflex Scores:      Patellar reflexes are 1+ on the right side and 1+ on the left side.      Achilles reflexes are 1+ on the right side and 1+ on the left side. Skin: Skin is warm, dry and intact. Rash noted. No purpura noted. Rash is papular. Rash is not macular, not maculopapular, not nodular, not pustular, not vesicular and not urticarial. She is not diaphoretic. No pallor.     Over her back there are scattered areas of scaly papules with some coalescence into patches.  Psychiatric: She has a normal mood and affect. Her behavior is normal. Judgment and thought content normal.     Lab Results  Component Value Date   WBC 5.5 07/07/2012   HGB 13.9 07/07/2012   HCT 41.0 07/07/2012   PLT 271 07/07/2012   GLUCOSE 97 07/07/2012   CHOL 186 01/12/2011   TRIG 102.0 01/12/2011   HDL 41.00 01/12/2011   LDLCALC 125* 01/12/2011   ALT 8 07/07/2012   AST 16 07/07/2012   NA 140 07/07/2012   K 3.6 07/07/2012   CL 104 07/07/2012   CREATININE 0.90 07/07/2012   BUN 6 07/07/2012   CO2 24 07/07/2012   TSH 0.94 11/15/2011       Assessment & Plan:

## 2013-06-01 NOTE — Patient Instructions (Signed)
Preventive Care for Adults, Female A healthy lifestyle and preventive care can promote health and wellness. Preventive health guidelines for women include the following key practices.  A routine yearly physical is a good way to check with your caregiver about your health and preventive screening. It is a chance to share any concerns and updates on your health, and to receive a thorough exam.  Visit your dentist for a routine exam and preventive care every 6 months. Brush your teeth twice a day and floss once a day. Good oral hygiene prevents tooth decay and gum disease.  The frequency of eye exams is based on your age, health, family medical history, use of contact lenses, and other factors. Follow your caregiver's recommendations for frequency of eye exams.  Eat a healthy diet. Foods like vegetables, fruits, whole grains, low-fat dairy products, and lean protein foods contain the nutrients you need without too many calories. Decrease your intake of foods high in solid fats, added sugars, and salt. Eat the right amount of calories for you.Get information about a proper diet from your caregiver, if necessary.  Regular physical exercise is one of the most important things you can do for your health. Most adults should get at least 150 minutes of moderate-intensity exercise (any activity that increases your heart rate and causes you to sweat) each week. In addition, most adults need muscle-strengthening exercises on 2 or more days a week.  Maintain a healthy weight. The body mass index (BMI) is a screening tool to identify possible weight problems. It provides an estimate of body fat based on height and weight. Your caregiver can help determine your BMI, and can help you achieve or maintain a healthy weight.For adults 20 years and older:  A BMI below 18.5 is considered underweight.  A BMI of 18.5 to 24.9 is normal.  A BMI of 25 to 29.9 is considered overweight.  A BMI of 30 and above is  considered obese.  Maintain normal blood lipids and cholesterol levels by exercising and minimizing your intake of saturated fat. Eat a balanced diet with plenty of fruit and vegetables. Blood tests for lipids and cholesterol should begin at age 20 and be repeated every 5 years. If your lipid or cholesterol levels are high, you are over 50, or you are at high risk for heart disease, you may need your cholesterol levels checked more frequently.Ongoing high lipid and cholesterol levels should be treated with medicines if diet and exercise are not effective.  If you smoke, find out from your caregiver how to quit. If you do not use tobacco, do not start.  If you are pregnant, do not drink alcohol. If you are breastfeeding, be very cautious about drinking alcohol. If you are not pregnant and choose to drink alcohol, do not exceed 1 drink per day. One drink is considered to be 12 ounces (355 mL) of beer, 5 ounces (148 mL) of wine, or 1.5 ounces (44 mL) of liquor.  Avoid use of street drugs. Do not share needles with anyone. Ask for help if you need support or instructions about stopping the use of drugs.  High blood pressure causes heart disease and increases the risk of stroke. Your blood pressure should be checked at least every 1 to 2 years. Ongoing high blood pressure should be treated with medicines if weight loss and exercise are not effective.  If you are 55 to 52 years old, ask your caregiver if you should take aspirin to prevent strokes.  Diabetes   screening involves taking a blood sample to check your fasting blood sugar level. This should be done once every 3 years, after age 45, if you are within normal weight and without risk factors for diabetes. Testing should be considered at a younger age or be carried out more frequently if you are overweight and have at least 1 risk factor for diabetes.  Breast cancer screening is essential preventive care for women. You should practice "breast  self-awareness." This means understanding the normal appearance and feel of your breasts and may include breast self-examination. Any changes detected, no matter how small, should be reported to a caregiver. Women in their 20s and 30s should have a clinical breast exam (CBE) by a caregiver as part of a regular health exam every 1 to 3 years. After age 40, women should have a CBE every year. Starting at age 40, women should consider having a mammography (breast X-ray test) every year. Women who have a family history of breast cancer should talk to their caregiver about genetic screening. Women at a high risk of breast cancer should talk to their caregivers about having magnetic resonance imaging (MRI) and a mammography every year.  The Pap test is a screening test for cervical cancer. A Pap test can show cell changes on the cervix that might become cervical cancer if left untreated. A Pap test is a procedure in which cells are obtained and examined from the lower end of the uterus (cervix).  Women should have a Pap test starting at age 21.  Between ages 21 and 29, Pap tests should be repeated every 2 years.  Beginning at age 30, you should have a Pap test every 3 years as long as the past 3 Pap tests have been normal.  Some women have medical problems that increase the chance of getting cervical cancer. Talk to your caregiver about these problems. It is especially important to talk to your caregiver if a new problem develops soon after your last Pap test. In these cases, your caregiver may recommend more frequent screening and Pap tests.  The above recommendations are the same for women who have or have not gotten the vaccine for human papillomavirus (HPV).  If you had a hysterectomy for a problem that was not cancer or a condition that could lead to cancer, then you no longer need Pap tests. Even if you no longer need a Pap test, a regular exam is a good idea to make sure no other problems are  starting.  If you are between ages 65 and 70, and you have had normal Pap tests going back 10 years, you no longer need Pap tests. Even if you no longer need a Pap test, a regular exam is a good idea to make sure no other problems are starting.  If you have had past treatment for cervical cancer or a condition that could lead to cancer, you need Pap tests and screening for cancer for at least 20 years after your treatment.  If Pap tests have been discontinued, risk factors (such as a new sexual partner) need to be reassessed to determine if screening should be resumed.  The HPV test is an additional test that may be used for cervical cancer screening. The HPV test looks for the virus that can cause the cell changes on the cervix. The cells collected during the Pap test can be tested for HPV. The HPV test could be used to screen women aged 30 years and older, and should   be used in women of any age who have unclear Pap test results. After the age of 30, women should have HPV testing at the same frequency as a Pap test.  Colorectal cancer can be detected and often prevented. Most routine colorectal cancer screening begins at the age of 50 and continues through age 75. However, your caregiver may recommend screening at an earlier age if you have risk factors for colon cancer. On a yearly basis, your caregiver may provide home test kits to check for hidden blood in the stool. Use of a small camera at the end of a tube, to directly examine the colon (sigmoidoscopy or colonoscopy), can detect the earliest forms of colorectal cancer. Talk to your caregiver about this at age 50, when routine screening begins. Direct examination of the colon should be repeated every 5 to 10 years through age 75, unless early forms of pre-cancerous polyps or small growths are found.  Hepatitis C blood testing is recommended for all people born from 1945 through 1965 and any individual with known risks for hepatitis C.  Practice  safe sex. Use condoms and avoid high-risk sexual practices to reduce the spread of sexually transmitted infections (STIs). STIs include gonorrhea, chlamydia, syphilis, trichomonas, herpes, HPV, and human immunodeficiency virus (HIV). Herpes, HIV, and HPV are viral illnesses that have no cure. They can result in disability, cancer, and death. Sexually active women aged 25 and younger should be checked for chlamydia. Older women with new or multiple partners should also be tested for chlamydia. Testing for other STIs is recommended if you are sexually active and at increased risk.  Osteoporosis is a disease in which the bones lose minerals and strength with aging. This can result in serious bone fractures. The risk of osteoporosis can be identified using a bone density scan. Women ages 65 and over and women at risk for fractures or osteoporosis should discuss screening with their caregivers. Ask your caregiver whether you should take a calcium supplement or vitamin D to reduce the rate of osteoporosis.  Menopause can be associated with physical symptoms and risks. Hormone replacement therapy is available to decrease symptoms and risks. You should talk to your caregiver about whether hormone replacement therapy is right for you.  Use sunscreen with sun protection factor (SPF) of 30 or more. Apply sunscreen liberally and repeatedly throughout the day. You should seek shade when your shadow is shorter than you. Protect yourself by wearing long sleeves, pants, a wide-brimmed hat, and sunglasses year round, whenever you are outdoors.  Once a month, do a whole body skin exam, using a mirror to look at the skin on your back. Notify your caregiver of new moles, moles that have irregular borders, moles that are larger than a pencil eraser, or moles that have changed in shape or color.  Stay current with required immunizations.  Influenza. You need a dose every fall (or winter). The composition of the flu vaccine  changes each year, so being vaccinated once is not enough.  Pneumococcal polysaccharide. You need 1 to 2 doses if you smoke cigarettes or if you have certain chronic medical conditions. You need 1 dose at age 65 (or older) if you have never been vaccinated.  Tetanus, diphtheria, pertussis (Tdap, Td). Get 1 dose of Tdap vaccine if you are younger than age 65, are over 65 and have contact with an infant, are a healthcare worker, are pregnant, or simply want to be protected from whooping cough. After that, you need a Td   booster dose every 10 years. Consult your caregiver if you have not had at least 3 tetanus and diphtheria-containing shots sometime in your life or have a deep or dirty wound.  HPV. You need this vaccine if you are a woman age 26 or younger. The vaccine is given in 3 doses over 6 months.  Measles, mumps, rubella (MMR). You need at least 1 dose of MMR if you were born in 1957 or later. You may also need a second dose.  Meningococcal. If you are age 19 to 21 and a first-year college student living in a residence hall, or have one of several medical conditions, you need to get vaccinated against meningococcal disease. You may also need additional booster doses.  Zoster (shingles). If you are age 60 or older, you should get this vaccine.  Varicella (chickenpox). If you have never had chickenpox or you were vaccinated but received only 1 dose, talk to your caregiver to find out if you need this vaccine.  Hepatitis A. You need this vaccine if you have a specific risk factor for hepatitis A virus infection or you simply wish to be protected from this disease. The vaccine is usually given as 2 doses, 6 to 18 months apart.  Hepatitis B. You need this vaccine if you have a specific risk factor for hepatitis B virus infection or you simply wish to be protected from this disease. The vaccine is given in 3 doses, usually over 6 months. Preventive Services / Frequency Ages 19 to 39  Blood  pressure check.** / Every 1 to 2 years.  Lipid and cholesterol check.** / Every 5 years beginning at age 20.  Clinical breast exam.** / Every 3 years for women in their 20s and 30s.  Pap test.** / Every 2 years from ages 21 through 29. Every 3 years starting at age 30 through age 65 or 70 with a history of 3 consecutive normal Pap tests.  HPV screening.** / Every 3 years from ages 30 through ages 65 to 70 with a history of 3 consecutive normal Pap tests.  Hepatitis C blood test.** / For any individual with known risks for hepatitis C.  Skin self-exam. / Monthly.  Influenza immunization.** / Every year.  Pneumococcal polysaccharide immunization.** / 1 to 2 doses if you smoke cigarettes or if you have certain chronic medical conditions.  Tetanus, diphtheria, pertussis (Tdap, Td) immunization. / A one-time dose of Tdap vaccine. After that, you need a Td booster dose every 10 years.  HPV immunization. / 3 doses over 6 months, if you are 26 and younger.  Measles, mumps, rubella (MMR) immunization. / You need at least 1 dose of MMR if you were born in 1957 or later. You may also need a second dose.  Meningococcal immunization. / 1 dose if you are age 19 to 21 and a first-year college student living in a residence hall, or have one of several medical conditions, you need to get vaccinated against meningococcal disease. You may also need additional booster doses.  Varicella immunization.** / Consult your caregiver.  Hepatitis A immunization.** / Consult your caregiver. 2 doses, 6 to 18 months apart.  Hepatitis B immunization.** / Consult your caregiver. 3 doses usually over 6 months. Ages 40 to 64  Blood pressure check.** / Every 1 to 2 years.  Lipid and cholesterol check.** / Every 5 years beginning at age 20.  Clinical breast exam.** / Every year after age 40.  Mammogram.** / Every year beginning at age 40   and continuing for as long as you are in good health. Consult with your  caregiver.  Pap test.** / Every 3 years starting at age 30 through age 65 or 70 with a history of 3 consecutive normal Pap tests.  HPV screening.** / Every 3 years from ages 30 through ages 65 to 70 with a history of 3 consecutive normal Pap tests.  Fecal occult blood test (FOBT) of stool. / Every year beginning at age 50 and continuing until age 75. You may not need to do this test if you get a colonoscopy every 10 years.  Flexible sigmoidoscopy or colonoscopy.** / Every 5 years for a flexible sigmoidoscopy or every 10 years for a colonoscopy beginning at age 50 and continuing until age 75.  Hepatitis C blood test.** / For all people born from 1945 through 1965 and any individual with known risks for hepatitis C.  Skin self-exam. / Monthly.  Influenza immunization.** / Every year.  Pneumococcal polysaccharide immunization.** / 1 to 2 doses if you smoke cigarettes or if you have certain chronic medical conditions.  Tetanus, diphtheria, pertussis (Tdap, Td) immunization.** / A one-time dose of Tdap vaccine. After that, you need a Td booster dose every 10 years.  Measles, mumps, rubella (MMR) immunization. / You need at least 1 dose of MMR if you were born in 1957 or later. You may also need a second dose.  Varicella immunization.** / Consult your caregiver.  Meningococcal immunization.** / Consult your caregiver.  Hepatitis A immunization.** / Consult your caregiver. 2 doses, 6 to 18 months apart.  Hepatitis B immunization.** / Consult your caregiver. 3 doses, usually over 6 months. Ages 65 and over  Blood pressure check.** / Every 1 to 2 years.  Lipid and cholesterol check.** / Every 5 years beginning at age 20.  Clinical breast exam.** / Every year after age 40.  Mammogram.** / Every year beginning at age 40 and continuing for as long as you are in good health. Consult with your caregiver.  Pap test.** / Every 3 years starting at age 30 through age 65 or 70 with a 3  consecutive normal Pap tests. Testing can be stopped between 65 and 70 with 3 consecutive normal Pap tests and no abnormal Pap or HPV tests in the past 10 years.  HPV screening.** / Every 3 years from ages 30 through ages 65 or 70 with a history of 3 consecutive normal Pap tests. Testing can be stopped between 65 and 70 with 3 consecutive normal Pap tests and no abnormal Pap or HPV tests in the past 10 years.  Fecal occult blood test (FOBT) of stool. / Every year beginning at age 50 and continuing until age 75. You may not need to do this test if you get a colonoscopy every 10 years.  Flexible sigmoidoscopy or colonoscopy.** / Every 5 years for a flexible sigmoidoscopy or every 10 years for a colonoscopy beginning at age 50 and continuing until age 75.  Hepatitis C blood test.** / For all people born from 1945 through 1965 and any individual with known risks for hepatitis C.  Osteoporosis screening.** / A one-time screening for women ages 65 and over and women at risk for fractures or osteoporosis.  Skin self-exam. / Monthly.  Influenza immunization.** / Every year.  Pneumococcal polysaccharide immunization.** / 1 dose at age 65 (or older) if you have never been vaccinated.  Tetanus, diphtheria, pertussis (Tdap, Td) immunization. / A one-time dose of Tdap vaccine if you are over   65 and have contact with an infant, are a healthcare worker, or simply want to be protected from whooping cough. After that, you need a Td booster dose every 10 years.  Varicella immunization.** / Consult your caregiver.  Meningococcal immunization.** / Consult your caregiver.  Hepatitis A immunization.** / Consult your caregiver. 2 doses, 6 to 18 months apart.  Hepatitis B immunization.** / Check with your caregiver. 3 doses, usually over 6 months. ** Family history and personal history of risk and conditions may change your caregiver's recommendations. Document Released: 12/21/2001 Document Revised: 01/17/2012  Document Reviewed: 03/22/2011 [employer] Patient Information 2014 [employer].  

## 2013-06-02 ENCOUNTER — Encounter: Payer: Self-pay | Admitting: Internal Medicine

## 2013-06-02 NOTE — Assessment & Plan Note (Signed)
She is getting relief from her pain with tramadol

## 2013-06-02 NOTE — Assessment & Plan Note (Signed)
Exam done She was referred for a colonoscopy and mammogram Labs ordered Vaccines were reviewed Pt ed material was given

## 2013-06-02 NOTE — Assessment & Plan Note (Signed)
Will treat with TAC cream 

## 2013-06-02 NOTE — Assessment & Plan Note (Signed)
She will restart nasonex

## 2013-06-18 ENCOUNTER — Telehealth: Payer: Self-pay | Admitting: Internal Medicine

## 2013-06-18 NOTE — Telephone Encounter (Signed)
It is okay with me if they fill it early

## 2013-06-18 NOTE — Telephone Encounter (Signed)
Pt called stated that she lost her med (tramadol) that she pick up on 06/01/13. Pt was in so much pain so she took percocet 500 mg from one of her friend. Pt is now experiencing frequency urination and nervousness. Pt stated she need more pain med, went to drug store and was inform that it is not time to refill this med yet. Please advise.

## 2013-06-18 NOTE — Telephone Encounter (Signed)
Called in early refill.

## 2013-06-19 ENCOUNTER — Telehealth: Payer: Self-pay | Admitting: *Deleted

## 2013-06-19 NOTE — Telephone Encounter (Signed)
Pt called states she is feeling worse.  Further states she now has coughing, sneezing, and stomach pain.  Please advise

## 2013-06-27 ENCOUNTER — Ambulatory Visit (HOSPITAL_COMMUNITY)
Admission: RE | Admit: 2013-06-27 | Discharge: 2013-06-27 | Disposition: A | Discharge status: Home / Self Care | Payer: 59 | Source: Ambulatory Visit | Attending: Internal Medicine | Admitting: Internal Medicine

## 2013-06-27 DIAGNOSIS — Z1231 Encounter for screening mammogram for malignant neoplasm of breast: Secondary | ICD-10-CM | POA: Insufficient documentation

## 2013-07-11 ENCOUNTER — Telehealth: Payer: Self-pay | Admitting: *Deleted

## 2013-07-11 DIAGNOSIS — M5416 Radiculopathy, lumbar region: Secondary | ICD-10-CM

## 2013-07-11 MED ORDER — TRAMADOL HCL 50 MG PO TABS: 50.0000 mg | ORAL_TABLET | Freq: Three times a day (TID) | ORAL | Status: DC | PRN

## 2013-07-11 NOTE — Telephone Encounter (Signed)
Pt called requesting Tramadol refill.  Last OV 7.25.2014.  Please advise

## 2013-07-11 NOTE — Telephone Encounter (Signed)
ok 

## 2013-07-11 NOTE — Telephone Encounter (Signed)
rx called in, pt notified

## 2013-07-30 ENCOUNTER — Other Ambulatory Visit: Payer: Self-pay | Admitting: Internal Medicine

## 2013-08-16 ENCOUNTER — Telehealth: Payer: Self-pay | Admitting: Internal Medicine

## 2013-08-16 NOTE — Telephone Encounter (Signed)
OK to fill this prescription with additional refills x1 Thank you!  

## 2013-08-16 NOTE — Telephone Encounter (Signed)
Pt request refill for triamcinolone. Please call pt if this is ok.

## 2013-10-19 ENCOUNTER — Other Ambulatory Visit: Payer: Self-pay

## 2013-10-19 DIAGNOSIS — M5416 Radiculopathy, lumbar region: Secondary | ICD-10-CM

## 2013-10-19 MED ORDER — TRAMADOL HCL 50 MG PO TABS: 50.0000 mg | ORAL_TABLET | Freq: Three times a day (TID) | ORAL | Status: DC | PRN

## 2013-12-12 ENCOUNTER — Ambulatory Visit (INDEPENDENT_AMBULATORY_CARE_PROVIDER_SITE_OTHER): Payer: 59 | Admitting: Internal Medicine

## 2013-12-12 ENCOUNTER — Ambulatory Visit: Payer: 59 | Admitting: Internal Medicine

## 2013-12-12 ENCOUNTER — Encounter: Payer: Self-pay | Admitting: Internal Medicine

## 2013-12-12 VITALS — BP 110/70 | HR 73 | Temp 98.5°F | Resp 16 | Ht 61.5 in | Wt 216.2 lb

## 2013-12-12 DIAGNOSIS — G5600 Carpal tunnel syndrome, unspecified upper limb: Secondary | ICD-10-CM

## 2013-12-12 DIAGNOSIS — G5601 Carpal tunnel syndrome, right upper limb: Secondary | ICD-10-CM

## 2013-12-12 MED ORDER — DICLOFENAC 35 MG PO CAPS: 1.0000 | ORAL_CAPSULE | Freq: Three times a day (TID) | ORAL | Status: DC | PRN

## 2013-12-12 NOTE — Patient Instructions (Signed)

## 2013-12-12 NOTE — Progress Notes (Signed)
Pre visit review using our clinic review tool, if applicable. No additional management support is needed unless otherwise documented below in the visit note. 

## 2013-12-12 NOTE — Assessment & Plan Note (Signed)
I have asked her to see occupational therapy about this She will cont tramadol as needed and will add zorvolex as well for pain relief If she does not improve with this then I will ask her to consider surgery

## 2013-12-12 NOTE — Progress Notes (Signed)
   Subjective:    Patient ID: [name], female    DOB: 05-05-1961, 53 y.o.   MRN: [id number]  Arthritis Presents for follow-up visit. She complains of pain. She reports no stiffness, joint swelling or joint warmth. Affected locations include the right wrist. Her pain is at a severity of 2/10. Associated symptoms include pain at night and pain while resting. Pertinent negatives include no diarrhea, dry eyes, dry mouth, dysuria, fatigue, fever, rash, Raynaud's syndrome, uveitis or weight loss. There is no history of chronic back pain, lupus, osteoarthritis, psoriasis or rheumatoid arthritis.  Her pertinent risk factors include overuse. Past treatments include an opioid (splint). The treatment provided no relief. Factors aggravating her arthritis include activity. Compliance with prior treatments has been poor. Past compliance problems: she tells me that the splint makes the pain worse. Compliance with total regimen is 26-50%.      Review of Systems  Constitutional: Negative.  Negative for fever, weight loss and fatigue.  HENT: Negative.   Eyes: Negative.   Respiratory: Negative.  Negative for cough, chest tightness, shortness of breath, wheezing and stridor.   Cardiovascular: Negative.  Negative for chest pain, palpitations and leg swelling.  Gastrointestinal: Negative.  Negative for abdominal pain and diarrhea.  Endocrine: Negative.   Genitourinary: Negative.  Negative for dysuria.  Musculoskeletal: Positive for arthralgias and arthritis. Negative for joint swelling and stiffness.  Skin: Negative.  Negative for rash.  Allergic/Immunologic: Negative.   Neurological: Negative.   Hematological: Negative.  Negative for adenopathy. Does not bruise/bleed easily.  Psychiatric/Behavioral: Negative.        Objective:   Physical Exam  Vitals reviewed. Constitutional: She is oriented to person, place, and time. She appears well-developed and well-nourished. No distress.  HENT:  Head:  Normocephalic and atraumatic.  Mouth/Throat: Oropharynx is clear and moist. No oropharyngeal exudate.  Eyes: Conjunctivae are normal. Right eye exhibits no discharge. Left eye exhibits no discharge. No scleral icterus.  Neck: Normal range of motion. Neck supple. No JVD present. No tracheal deviation present. No thyromegaly present.  Cardiovascular: Normal rate, regular rhythm and intact distal pulses.  Exam reveals no gallop and no friction rub.   No murmur heard. Pulmonary/Chest: Effort normal and breath sounds normal. No stridor. No respiratory distress. She has no wheezes. She has no rales. She exhibits no tenderness.  Abdominal: Soft. Bowel sounds are normal. She exhibits no distension and no mass. There is no tenderness. There is no rebound and no guarding.  Musculoskeletal: Normal range of motion. She exhibits no edema and no tenderness.       Right wrist: She exhibits normal range of motion, no bony tenderness, no swelling, no effusion, no crepitus, no deformity and no laceration.  Rt wrist + tinel's and phalen's tests  Lymphadenopathy:    She has no cervical adenopathy.  Neurological: She is oriented to person, place, and time.  Skin: Skin is warm and dry. No rash noted. She is not diaphoretic. No erythema. No pallor.  Psychiatric: She has a normal mood and affect. Her behavior is normal. Judgment and thought content normal.          Assessment & Plan:

## 2013-12-13 ENCOUNTER — Telehealth: Payer: Self-pay

## 2013-12-13 DIAGNOSIS — G5601 Carpal tunnel syndrome, right upper limb: Secondary | ICD-10-CM

## 2013-12-13 MED ORDER — DICLOFENAC 18 MG PO CAPS: 1.0000 | ORAL_CAPSULE | Freq: Three times a day (TID) | ORAL | Status: DC | PRN

## 2013-12-13 NOTE — Telephone Encounter (Signed)
Try the lower dose - rx was sent to pharmacy Be sure to take it with food  T. Ronnald Ramp

## 2013-12-13 NOTE — Telephone Encounter (Signed)
The patient called and stated the new medication she received is making her stomach cramp.  She is hoping to get advice on if she needs to stop this medication

## 2013-12-14 NOTE — Telephone Encounter (Signed)
LMOVM to advise per MD

## 2013-12-18 NOTE — Telephone Encounter (Signed)
The patient states she can not afford the medication she was prescribed.

## 2014-01-11 ENCOUNTER — Telehealth: Payer: Self-pay | Admitting: *Deleted

## 2014-01-11 ENCOUNTER — Ambulatory Visit: Payer: 59 | Attending: Internal Medicine | Admitting: *Deleted

## 2014-01-11 DIAGNOSIS — M25549 Pain in joints of unspecified hand: Secondary | ICD-10-CM | POA: Insufficient documentation

## 2014-01-11 DIAGNOSIS — IMO0001 Reserved for inherently not codable concepts without codable children: Secondary | ICD-10-CM | POA: Insufficient documentation

## 2014-01-11 DIAGNOSIS — R5381 Other malaise: Secondary | ICD-10-CM | POA: Insufficient documentation

## 2014-01-11 DIAGNOSIS — M2550 Pain in unspecified joint: Secondary | ICD-10-CM | POA: Insufficient documentation

## 2014-01-11 NOTE — Telephone Encounter (Signed)
Patient phoned after OV with "hand doctor" b/c she was told she had info to relay to PCP.  Returned patient's call, no answer, left message

## 2014-01-14 ENCOUNTER — Telehealth: Payer: Self-pay

## 2014-01-14 DIAGNOSIS — G5601 Carpal tunnel syndrome, right upper limb: Secondary | ICD-10-CM

## 2014-01-14 DIAGNOSIS — M5416 Radiculopathy, lumbar region: Secondary | ICD-10-CM

## 2014-01-14 MED ORDER — DICLOFENAC SODIUM 75 MG PO TBEC: 75.0000 mg | DELAYED_RELEASE_TABLET | Freq: Two times a day (BID) | ORAL | Status: DC

## 2014-01-14 NOTE — Telephone Encounter (Signed)
done

## 2014-01-14 NOTE — Telephone Encounter (Signed)
Received pharmacy rejection stating that insurance will not cover zorvolex 18 mg  without a prior authorization. Please change to an alternative. Thanks

## 2014-01-14 NOTE — Telephone Encounter (Signed)
The patient called hoping to get a referral to a hand specialist due to her worsening carpal tunnel pain   Thanks!

## 2014-01-14 NOTE — Telephone Encounter (Signed)
[facility] to notify 

## 2014-01-15 ENCOUNTER — Telehealth: Payer: Self-pay | Admitting: *Deleted

## 2014-01-15 ENCOUNTER — Ambulatory Visit: Payer: 59 | Admitting: Occupational Therapy

## 2014-01-15 MED ORDER — DESONIDE 0.05 % EX LOTN: TOPICAL_LOTION | CUTANEOUS | Status: DC

## 2014-01-15 NOTE — Telephone Encounter (Signed)
Patient phoned requesting refill on desonide cream.  Please advise.  CB# [phone]

## 2014-01-15 NOTE — Telephone Encounter (Signed)
done

## 2014-01-16 ENCOUNTER — Ambulatory Visit: Payer: 59 | Admitting: *Deleted

## 2014-01-16 NOTE — Telephone Encounter (Signed)
Phoned and left voicemail message that PCP had sent requested refill to her pharmacy on file.

## 2014-01-21 ENCOUNTER — Telehealth: Payer: Self-pay | Admitting: *Deleted

## 2014-01-21 NOTE — Telephone Encounter (Signed)
She can get OTC cortaid cream

## 2014-01-21 NOTE — Telephone Encounter (Signed)
Left detailed message on pts VM. 

## 2014-01-21 NOTE — Telephone Encounter (Signed)
Pt called states the Desonide Rx is too expensive.  She is requesting something different.  Please advise

## 2014-01-23 ENCOUNTER — Encounter: Payer: 59 | Admitting: Occupational Therapy

## 2014-01-30 ENCOUNTER — Ambulatory Visit: Payer: 59 | Admitting: Occupational Therapy

## 2014-01-31 ENCOUNTER — Ambulatory Visit: Payer: 59 | Admitting: Occupational Therapy

## 2014-02-04 ENCOUNTER — Telehealth: Payer: Self-pay

## 2014-02-04 ENCOUNTER — Ambulatory Visit: Payer: 59 | Admitting: Occupational Therapy

## 2014-02-04 NOTE — Telephone Encounter (Signed)
The patient is calling to get a refill of her tramadol, pain medicine, thanks!     Callback - [phone]

## 2014-02-04 NOTE — Telephone Encounter (Signed)
Please advise, medication last filled 10/19/13 #[id number]

## 2014-02-05 ENCOUNTER — Telehealth: Payer: Self-pay | Admitting: *Deleted

## 2014-02-05 DIAGNOSIS — M5416 Radiculopathy, lumbar region: Secondary | ICD-10-CM

## 2014-02-05 MED ORDER — TRAMADOL HCL 50 MG PO TABS: 50.0000 mg | ORAL_TABLET | Freq: Three times a day (TID) | ORAL | Status: DC | PRN

## 2014-02-05 NOTE — Telephone Encounter (Signed)
Pt advised Rx called in

## 2014-02-05 NOTE — Telephone Encounter (Signed)
done

## 2014-02-05 NOTE — Telephone Encounter (Signed)
Pt called requesting Tramadol refill.  Last refill 3.1.2015 per Pharmacist #[id number].  Please advise

## 2014-02-12 ENCOUNTER — Ambulatory Visit: Payer: 59 | Attending: Internal Medicine

## 2014-02-12 DIAGNOSIS — R5381 Other malaise: Secondary | ICD-10-CM | POA: Insufficient documentation

## 2014-02-12 DIAGNOSIS — IMO0001 Reserved for inherently not codable concepts without codable children: Secondary | ICD-10-CM | POA: Insufficient documentation

## 2014-02-12 DIAGNOSIS — M25549 Pain in joints of unspecified hand: Secondary | ICD-10-CM | POA: Insufficient documentation

## 2014-02-12 DIAGNOSIS — M2550 Pain in unspecified joint: Secondary | ICD-10-CM | POA: Insufficient documentation

## 2014-02-20 ENCOUNTER — Encounter: Payer: 59 | Admitting: Occupational Therapy

## 2014-02-22 ENCOUNTER — Encounter: Payer: 59 | Admitting: Occupational Therapy

## 2014-05-06 ENCOUNTER — Telehealth: Payer: Self-pay | Admitting: *Deleted

## 2014-05-06 NOTE — Telephone Encounter (Signed)
Left msg on triage stating that she is still having problem with her legds especially this morning. Have had xrays done but something is wrong. Called pt back no answer LMOM need to make follow-up appt with Dr. Ronnald Ramp concerning labs...Johny Chess

## 2014-05-08 ENCOUNTER — Ambulatory Visit (INDEPENDENT_AMBULATORY_CARE_PROVIDER_SITE_OTHER): Payer: 59 | Admitting: Internal Medicine

## 2014-05-08 ENCOUNTER — Encounter: Payer: Self-pay | Admitting: Internal Medicine

## 2014-05-08 VITALS — BP 124/86 | HR 60 | Temp 98.1°F | Resp 16 | Ht 61.5 in | Wt 228.0 lb

## 2014-05-08 DIAGNOSIS — IMO0002 Reserved for concepts with insufficient information to code with codable children: Secondary | ICD-10-CM

## 2014-05-08 DIAGNOSIS — M5416 Radiculopathy, lumbar region: Secondary | ICD-10-CM

## 2014-05-08 NOTE — Progress Notes (Signed)
Pre visit review using our clinic review tool, if applicable. No additional management support is needed unless otherwise documented below in the visit note. 

## 2014-05-08 NOTE — Patient Instructions (Signed)

## 2014-05-12 ENCOUNTER — Encounter: Payer: Self-pay | Admitting: Internal Medicine

## 2014-05-12 NOTE — Progress Notes (Signed)
   Subjective:    Patient ID: [name], female    DOB: 08/01/1961, 53 y.o.   MRN: [id number]  Back Pain This is a chronic problem. The current episode started more than 1 year ago. The problem occurs intermittently. The problem is unchanged. The pain is present in the lumbar spine. The quality of the pain is described as aching and shooting. The pain radiates to the right thigh. The pain is at a severity of 3/10. The pain is moderate. The pain is worse during the day. The symptoms are aggravated by bending and position. Pertinent negatives include no abdominal pain, bladder incontinence, bowel incontinence, chest pain, dysuria, fever, headaches, leg pain, numbness, paresis, paresthesias, pelvic pain, perianal numbness, tingling, weakness or weight loss. Risk factors include obesity and lack of exercise. She has tried analgesics and NSAIDs for the symptoms. The treatment provided moderate relief.      Review of Systems  Constitutional: Negative.  Negative for fever and weight loss.  HENT: Negative.   Eyes: Negative.   Respiratory: Negative.   Cardiovascular: Negative.  Negative for chest pain, palpitations and leg swelling.  Gastrointestinal: Negative.  Negative for abdominal pain and bowel incontinence.  Endocrine: Negative.   Genitourinary: Negative.  Negative for bladder incontinence, dysuria and pelvic pain.  Musculoskeletal: Positive for back pain. Negative for arthralgias, gait problem, joint swelling, myalgias, neck pain and neck stiffness.  Skin: Negative.  Negative for rash.  Allergic/Immunologic: Negative.   Neurological: Negative.  Negative for dizziness, tingling, weakness, numbness, headaches and paresthesias.  Hematological: Negative.  Negative for adenopathy. Does not bruise/bleed easily.  Psychiatric/Behavioral: Negative.        Objective:   Physical Exam  Vitals reviewed. Constitutional: She is oriented to person, place, and time. She appears well-developed and  well-nourished. No distress.  HENT:  Head: Normocephalic and atraumatic.  Mouth/Throat: Oropharynx is clear and moist. No oropharyngeal exudate.  Eyes: Conjunctivae are normal. Right eye exhibits no discharge. Left eye exhibits no discharge. No scleral icterus.  Neck: Normal range of motion. Neck supple. No JVD present. No tracheal deviation present. No thyromegaly present.  Cardiovascular: Normal rate, regular rhythm, normal heart sounds and intact distal pulses.  Exam reveals no gallop and no friction rub.   No murmur heard. Pulmonary/Chest: Effort normal and breath sounds normal. No stridor. No respiratory distress. She has no wheezes. She has no rales. She exhibits no tenderness.  Abdominal: Soft. Bowel sounds are normal. She exhibits no distension and no mass. There is no tenderness. There is no rebound and no guarding.  Musculoskeletal: Normal range of motion. She exhibits no edema and no tenderness.  Lymphadenopathy:    She has no cervical adenopathy.  Neurological: She is oriented to person, place, and time.  Skin: Skin is warm and dry. No rash noted. She is not diaphoretic. No erythema. No pallor.  Psychiatric: She has a normal mood and affect. Her behavior is normal. Judgment and thought content normal.          Assessment & Plan:

## 2014-05-12 NOTE — Assessment & Plan Note (Signed)
She will cont the current meds Refer to PT for eval and treat

## 2014-05-15 ENCOUNTER — Other Ambulatory Visit: Payer: Self-pay | Admitting: Internal Medicine

## 2014-05-16 ENCOUNTER — Telehealth: Payer: Self-pay | Admitting: *Deleted

## 2014-05-16 NOTE — Telephone Encounter (Signed)
If she has low back pain then I will order an MRI of her lower back

## 2014-05-16 NOTE — Telephone Encounter (Signed)
Pt states she is still having numbness in her (L) leg. Now she is having pain in both legs. When she walk her feet be cramping up. She states she can't afford to do PT but there is something wrong. She is wanting to know is there any kind of xray or test to check her legs out...Johny Chess

## 2014-05-16 NOTE — Telephone Encounter (Signed)
Called pt no answer LMOM RTC.../lmb 

## 2014-05-16 NOTE — Telephone Encounter (Signed)
She needs to have her ortho send me any xray results that have been done over the last year

## 2014-05-16 NOTE — Telephone Encounter (Signed)
Pt call back she stated she has already had a MRI from her orthopedic md. What else can md do...[name]

## 2014-05-16 NOTE — Telephone Encounter (Signed)
Notified pt with md response. Inform pt once she get the report to make appt with md.../lmb

## 2014-05-27 ENCOUNTER — Encounter: Payer: Self-pay | Admitting: Internal Medicine

## 2014-06-04 ENCOUNTER — Ambulatory Visit: Payer: 59 | Admitting: Physical Therapy

## 2014-06-13 ENCOUNTER — Ambulatory Visit: Payer: 59 | Attending: Internal Medicine | Admitting: Physical Therapy

## 2014-06-14 ENCOUNTER — Ambulatory Visit: Payer: 59 | Admitting: Internal Medicine

## 2014-06-21 ENCOUNTER — Ambulatory Visit: Payer: 59 | Admitting: Internal Medicine

## 2014-07-26 ENCOUNTER — Ambulatory Visit: Payer: 59 | Admitting: Internal Medicine

## 2014-08-12 ENCOUNTER — Telehealth: Payer: Self-pay | Admitting: Internal Medicine

## 2014-08-12 NOTE — Telephone Encounter (Signed)
Pt states that since she started work she has been experiencing stronger leg pains and states TRAMADOL is not working for her. Pt would like to request a stronger pain med. Please contact pt and advise.  Pt would like to be contacted on husbands phone # [phone]

## 2014-08-13 NOTE — Telephone Encounter (Signed)
Left msg on triage stating she call yesterday concerning pain in her legs. Haven't received call back. Called pt back inform her before md can rx anything stronger she will need to make a f/u appt. Pt states she has an appt on 08/23/14 will discuss then...Johny Chess

## 2014-08-23 ENCOUNTER — Encounter: Payer: Self-pay | Admitting: Internal Medicine

## 2014-08-23 ENCOUNTER — Ambulatory Visit (INDEPENDENT_AMBULATORY_CARE_PROVIDER_SITE_OTHER): Payer: 59 | Admitting: Internal Medicine

## 2014-08-23 ENCOUNTER — Other Ambulatory Visit (INDEPENDENT_AMBULATORY_CARE_PROVIDER_SITE_OTHER): Payer: 59

## 2014-08-23 ENCOUNTER — Other Ambulatory Visit (HOSPITAL_COMMUNITY)
Admission: RE | Admit: 2014-08-23 | Discharge: 2014-08-23 | Disposition: A | Discharge status: Home / Self Care | Payer: 59 | Source: Ambulatory Visit | Attending: Internal Medicine | Admitting: Internal Medicine

## 2014-08-23 VITALS — BP 120/74 | HR 60 | Temp 98.5°F | Resp 16 | Wt 225.0 lb

## 2014-08-23 DIAGNOSIS — Z0000 Encounter for general adult medical examination without abnormal findings: Secondary | ICD-10-CM

## 2014-08-23 DIAGNOSIS — L309 Dermatitis, unspecified: Secondary | ICD-10-CM

## 2014-08-23 DIAGNOSIS — M5416 Radiculopathy, lumbar region: Secondary | ICD-10-CM

## 2014-08-23 DIAGNOSIS — E876 Hypokalemia: Secondary | ICD-10-CM

## 2014-08-23 DIAGNOSIS — Z01419 Encounter for gynecological examination (general) (routine) without abnormal findings: Secondary | ICD-10-CM | POA: Insufficient documentation

## 2014-08-23 DIAGNOSIS — T50905A Adverse effect of unspecified drugs, medicaments and biological substances, initial encounter: Secondary | ICD-10-CM

## 2014-08-23 DIAGNOSIS — Z1231 Encounter for screening mammogram for malignant neoplasm of breast: Secondary | ICD-10-CM

## 2014-08-23 DIAGNOSIS — M170 Bilateral primary osteoarthritis of knee: Secondary | ICD-10-CM

## 2014-08-23 DIAGNOSIS — Z124 Encounter for screening for malignant neoplasm of cervix: Secondary | ICD-10-CM

## 2014-08-23 DIAGNOSIS — Z23 Encounter for immunization: Secondary | ICD-10-CM

## 2014-08-23 LAB — COMPREHENSIVE METABOLIC PANEL
ALT: 29 U/L (ref 0–35)
AST: 29 U/L (ref 0–37)
Albumin: 3.2 g/dL — ABNORMAL LOW (ref 3.5–5.2)
Alkaline Phosphatase: 117 U/L (ref 39–117)
BUN: 12 mg/dL (ref 6–23)
CALCIUM: 9.1 mg/dL (ref 8.4–10.5)
CO2: 26 meq/L (ref 19–32)
CREATININE: 1.1 mg/dL (ref 0.4–1.2)
Chloride: 107 mEq/L (ref 96–112)
GFR: 67.49 mL/min (ref >60.00)
GLUCOSE: 99 mg/dL (ref 70–99)
Potassium: 3.2 mEq/L — ABNORMAL LOW (ref 3.5–5.1)
Sodium: 139 mEq/L (ref 135–145)
Total Bilirubin: 0.6 mg/dL (ref 0.2–1.2)
Total Protein: 7.4 g/dL (ref 6.0–8.3)

## 2014-08-23 LAB — LIPID PANEL
CHOL/HDL RATIO: 5
Cholesterol: 156 mg/dL (ref 0–200)
HDL: 32.3 mg/dL — ABNORMAL LOW (ref >39.00)
LDL Cholesterol: 111 mg/dL — ABNORMAL HIGH (ref 0–99)
NonHDL: 123.7
Triglycerides: 66 mg/dL (ref 0.0–149.0)
VLDL: 13.2 mg/dL (ref 0.0–40.0)

## 2014-08-23 LAB — TSH: TSH: 1.05 u[IU]/mL (ref 0.35–4.50)

## 2014-08-23 LAB — CBC WITH DIFFERENTIAL/PLATELET
BASOS ABS: 0 10*3/uL (ref 0.0–0.1)
BASOS PCT: 0.7 % (ref 0.0–3.0)
Eosinophils Absolute: 0.1 10*3/uL (ref 0.0–0.7)
Eosinophils Relative: 2.1 % (ref 0.0–5.0)
HCT: 37.2 % (ref 36.0–46.0)
HEMOGLOBIN: 11.8 g/dL — AB (ref 12.0–15.0)
LYMPHS PCT: 49.6 % — AB (ref 12.0–46.0)
Lymphs Abs: 2.2 10*3/uL (ref 0.7–4.0)
MCHC: 31.7 g/dL (ref 30.0–36.0)
MCV: 79.6 fl (ref 78.0–100.0)
MONOS PCT: 11.1 % (ref 3.0–12.0)
Monocytes Absolute: 0.5 10*3/uL (ref 0.1–1.0)
Neutro Abs: 1.6 10*3/uL (ref 1.4–7.7)
Neutrophils Relative %: 36.5 % — ABNORMAL LOW (ref 43.0–77.0)
Platelets: 258 10*3/uL (ref 150.0–400.0)
RBC: 4.68 Mil/uL (ref 3.87–5.11)
RDW: 16.2 % — AB (ref 11.5–15.5)
WBC: 4.4 10*3/uL (ref 4.0–10.5)

## 2014-08-23 LAB — FECAL OCCULT BLOOD, GUAIAC: FECAL OCCULT BLD: NEGATIVE

## 2014-08-23 MED ORDER — TRIAMCINOLONE ACETONIDE 0.5 % EX CREA: TOPICAL_CREAM | Freq: Three times a day (TID) | CUTANEOUS | Status: DC

## 2014-08-23 MED ORDER — TRAMADOL HCL 50 MG PO TABS: 50.0000 mg | ORAL_TABLET | Freq: Four times a day (QID) | ORAL | Status: DC | PRN

## 2014-08-23 MED ORDER — POTASSIUM CHLORIDE CRYS ER 20 MEQ PO TBCR: 20.0000 meq | EXTENDED_RELEASE_TABLET | Freq: Two times a day (BID) | ORAL | Status: DC

## 2014-08-23 NOTE — Progress Notes (Signed)
Subjective:    Patient ID: [name], female    DOB: [birth year], 53 y.o.   MRN: [id number]  Arthritis Presents for follow-up visit. The disease course has been stable. She complains of pain. She reports no stiffness, joint swelling or joint warmth. The symptoms have been stable. Affected locations include the left knee and right knee. Her pain is at a severity of 4/10. Associated symptoms include pain at night, pain while resting and rash ("chronic itchy patches, good response to TAC"). Pertinent negatives include no diarrhea, dry eyes, dry mouth, dysuria, fatigue, fever, Raynaud's syndrome, uveitis or weight loss. Her past medical history is significant for osteoarthritis. Her pertinent risk factors include overuse. Past treatments include acetaminophen, NSAIDs and an opioid.      Review of Systems  Constitutional: Negative.  Negative for fever, chills, weight loss, diaphoresis, appetite change and fatigue.  HENT: Negative.  Negative for congestion.   Eyes: Negative.   Respiratory: Negative.  Negative for cough, choking, chest tightness, shortness of breath, wheezing and stridor.   Cardiovascular: Negative.  Negative for chest pain, palpitations and leg swelling.  Gastrointestinal: Positive for constipation. Negative for nausea, vomiting, abdominal pain, diarrhea, blood in stool, anal bleeding and rectal pain.  Endocrine: Negative.   Genitourinary: Negative.  Negative for dysuria.  Musculoskeletal: Positive for arthralgias, arthritis and back pain. Negative for gait problem, joint swelling, myalgias, neck pain and stiffness.  Skin: Positive for rash ("chronic itchy patches, good response to TAC"). Negative for color change, pallor and wound.  Allergic/Immunologic: Negative.   Neurological: Negative.  Negative for dizziness, tremors, syncope, light-headedness, numbness and headaches.  Hematological: Negative.  Negative for adenopathy. Does not bruise/bleed easily.    Psychiatric/Behavioral: Negative.        Objective:   Physical Exam  Vitals reviewed. Constitutional: She is oriented to person, place, and time. She appears well-developed and well-nourished. No distress.  HENT:  Head: Normocephalic and atraumatic.  Mouth/Throat: Oropharynx is clear and moist.  Eyes: Conjunctivae are normal. Right eye exhibits no discharge. Left eye exhibits no discharge. No scleral icterus.  Neck: Normal range of motion. Neck supple. No JVD present. No tracheal deviation present. No thyromegaly present.  Cardiovascular: Normal rate, regular rhythm, normal heart sounds and intact distal pulses.  Exam reveals no gallop and no friction rub.   No murmur heard. Pulmonary/Chest: Effort normal and breath sounds normal. No stridor. No respiratory distress. She has no wheezes. She has no rales. She exhibits no tenderness.  Abdominal: Soft. Bowel sounds are normal. She exhibits no distension and no mass. There is no tenderness. There is no rebound and no guarding. Hernia confirmed negative in the right inguinal area and confirmed negative in the left inguinal area.  Genitourinary: Rectum normal, vagina normal and uterus normal. Rectal exam shows no external hemorrhoid, no internal hemorrhoid, no fissure, no mass, no tenderness and anal tone normal. Guaiac negative stool. No breast swelling, tenderness, discharge or bleeding. Pelvic exam was performed with patient supine. No labial fusion. There is no rash, tenderness, lesion or injury on the right labia. There is no rash, tenderness, lesion or injury on the left labia. Uterus is not deviated, not enlarged, not fixed and not tender. Cervix exhibits no motion tenderness, no discharge and no friability. Right adnexum displays no mass, no tenderness and no fullness. Left adnexum displays no mass, no tenderness and no fullness. No erythema, tenderness or bleeding around the vagina. No foreign body around the vagina. No signs of injury around  the vagina. No vaginal discharge found.  Musculoskeletal: She exhibits no edema and no tenderness.       Right knee: She exhibits decreased range of motion. She exhibits no swelling, no effusion, no ecchymosis, no deformity, no laceration, no erythema, normal alignment, no LCL laxity, normal patellar mobility and no bony tenderness. No tenderness found.       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 She exhibits normal range of motion, no swelling, no effusion, no ecchymosis, no laceration, no erythema, normal alignment, no LCL laxity, normal patellar mobility and no bony tenderness. No tenderness found.  Lymphadenopathy:    She has no cervical adenopathy.       Right: No inguinal adenopathy present.       Left: No inguinal adenopathy present.  Neurological: She is oriented to person, place, and time.  Skin: Skin is warm and dry. Rash (rare, scattered, hyperpigmented, scaly patches) noted. She is not diaphoretic. No erythema. No pallor.  Psychiatric: She has a normal mood and affect. Her behavior is normal. Judgment and thought content normal.     Lab Results  Component Value Date   WBC 5.8 06/01/2013   HGB 12.9 06/01/2013   HCT 39.9 06/01/2013   PLT 236.0 06/01/2013   GLUCOSE 84 06/01/2013   CHOL 198 06/01/2013   TRIG 52.0 06/01/2013   HDL 50.30 06/01/2013   LDLCALC 137* 06/01/2013   ALT 19 06/01/2013   AST 17 06/01/2013   NA 140 06/01/2013   K 4.4 06/01/2013   CL 106 06/01/2013   CREATININE 1.1 06/01/2013   BUN 13 06/01/2013   CO2 27 06/01/2013   TSH 1.00 06/01/2013       Assessment & Plan:

## 2014-08-23 NOTE — Patient Instructions (Signed)
Preventive Care for Adults A healthy lifestyle and preventive care can promote health and wellness. Preventive health guidelines for women include the following key practices.  A routine yearly physical is a good way to check with your health care provider about your health and preventive screening. It is a chance to share any concerns and updates on your health and to receive a thorough exam.  Visit your dentist for a routine exam and preventive care every 6 months. Brush your teeth twice a day and floss once a day. Good oral hygiene prevents tooth decay and gum disease.  The frequency of eye exams is based on your age, health, family medical history, use of contact lenses, and other factors. Follow your health care provider's recommendations for frequency of eye exams.  Eat a healthy diet. Foods like vegetables, fruits, whole grains, low-fat dairy products, and lean protein foods contain the nutrients you need without too many calories. Decrease your intake of foods high in solid fats, added sugars, and salt. Eat the right amount of calories for you.Get information about a proper diet from your health care provider, if necessary.  Regular physical exercise is one of the most important things you can do for your health. Most adults should get at least 150 minutes of moderate-intensity exercise (any activity that increases your heart rate and causes you to sweat) each week. In addition, most adults need muscle-strengthening exercises on 2 or more days a week.  Maintain a healthy weight. The body mass index (BMI) is a screening tool to identify possible weight problems. It provides an estimate of body fat based on height and weight. Your health care provider can find your BMI and can help you achieve or maintain a healthy weight.For adults 20 years and older:  A BMI below 18.5 is considered underweight.  A BMI of 18.5 to 24.9 is normal.  A BMI of 25 to 29.9 is considered overweight.  A BMI of  30 and above is considered obese.  Maintain normal blood lipids and cholesterol levels by exercising and minimizing your intake of saturated fat. Eat a balanced diet with plenty of fruit and vegetables. Blood tests for lipids and cholesterol should begin at age 76 and be repeated every 5 years. If your lipid or cholesterol levels are high, you are over 50, or you are at high risk for heart disease, you may need your cholesterol levels checked more frequently.Ongoing high lipid and cholesterol levels should be treated with medicines if diet and exercise are not working.  If you smoke, find out from your health care provider how to quit. If you do not use tobacco, do not start.  Lung cancer screening is recommended for adults aged 22-80 years who are at high risk for developing lung cancer because of a history of smoking. A yearly low-dose CT scan of the lungs is recommended for people who have at least a 30-pack-year history of smoking and are a current smoker or have quit within the past 15 years. A pack year of smoking is smoking an average of 1 pack of cigarettes a day for 1 year (for example: 1 pack a day for 30 years or 2 packs a day for 15 years). Yearly screening should continue until the smoker has stopped smoking for at least 15 years. Yearly screening should be stopped for people who develop a health problem that would prevent them from having lung cancer treatment.  If you are pregnant, do not drink alcohol. If you are breastfeeding,  be very cautious about drinking alcohol. If you are not pregnant and choose to drink alcohol, do not have more than 1 drink per day. One drink is considered to be 12 ounces (355 mL) of beer, 5 ounces (148 mL) of wine, or 1.5 ounces (44 mL) of liquor.  Avoid use of street drugs. Do not share needles with anyone. Ask for help if you need support or instructions about stopping the use of drugs.  High blood pressure causes heart disease and increases the risk of  stroke. Your blood pressure should be checked at least every 1 to 2 years. Ongoing high blood pressure should be treated with medicines if weight loss and exercise do not work.  If you are 3-86 years old, ask your health care provider if you should take aspirin to prevent strokes.  Diabetes screening involves taking a blood sample to check your fasting blood sugar level. This should be done once every 3 years, after age 67, if you are within normal weight and without risk factors for diabetes. Testing should be considered at a younger age or be carried out more frequently if you are overweight and have at least 1 risk factor for diabetes.  Breast cancer screening is essential preventive care for women. You should practice "breast self-awareness." This means understanding the normal appearance and feel of your breasts and may include breast self-examination. Any changes detected, no matter how small, should be reported to a health care provider. Women in their 8s and 30s should have a clinical breast exam (CBE) by a health care provider as part of a regular health exam every 1 to 3 years. After age 70, women should have a CBE every year. Starting at age 25, women should consider having a mammogram (breast X-ray test) every year. Women who have a family history of breast cancer should talk to their health care provider about genetic screening. Women at a high risk of breast cancer should talk to their health care providers about having an MRI and a mammogram every year.  Breast cancer gene (BRCA)-related cancer risk assessment is recommended for women who have family members with BRCA-related cancers. BRCA-related cancers include breast, ovarian, tubal, and peritoneal cancers. Having family members with these cancers may be associated with an increased risk for harmful changes (mutations) in the breast cancer genes BRCA1 and BRCA2. Results of the assessment will determine the need for genetic counseling and  BRCA1 and BRCA2 testing.  Routine pelvic exams to screen for cancer are no longer recommended for nonpregnant women who are considered low risk for cancer of the pelvic organs (ovaries, uterus, and vagina) and who do not have symptoms. Ask your health care provider if a screening pelvic exam is right for you.  If you have had past treatment for cervical cancer or a condition that could lead to cancer, you need Pap tests and screening for cancer for at least 20 years after your treatment. If Pap tests have been discontinued, your risk factors (such as having a new sexual partner) need to be reassessed to determine if screening should be resumed. Some women have medical problems that increase the chance of getting cervical cancer. In these cases, your health care provider may recommend more frequent screening and Pap tests.  The HPV test is an additional test that may be used for cervical cancer screening. The HPV test looks for the virus that can cause the cell changes on the cervix. The cells collected during the Pap test can be  tested for HPV. The HPV test could be used to screen women aged 30 years and older, and should be used in women of any age who have unclear Pap test results. After the age of 30, women should have HPV testing at the same frequency as a Pap test.  Colorectal cancer can be detected and often prevented. Most routine colorectal cancer screening begins at the age of 50 years and continues through age 75 years. However, your health care provider may recommend screening at an earlier age if you have risk factors for colon cancer. On a yearly basis, your health care provider may provide home test kits to check for hidden blood in the stool. Use of a small camera at the end of a tube, to directly examine the colon (sigmoidoscopy or colonoscopy), can detect the earliest forms of colorectal cancer. Talk to your health care provider about this at age 50, when routine screening begins. Direct  exam of the colon should be repeated every 5-10 years through age 75 years, unless early forms of pre-cancerous polyps or small growths are found.  People who are at an increased risk for hepatitis B should be screened for this virus. You are considered at high risk for hepatitis B if:  You were born in a country where hepatitis B occurs often. Talk with your health care provider about which countries are considered high risk.  Your parents were born in a high-risk country and you have not received a shot to protect against hepatitis B (hepatitis B vaccine).  You have HIV or AIDS.  You use needles to inject street drugs.  You live with, or have sex with, someone who has hepatitis B.  You get hemodialysis treatment.  You take certain medicines for conditions like cancer, organ transplantation, and autoimmune conditions.  Hepatitis C blood testing is recommended for all people born from 1945 through 1965 and any individual with known risks for hepatitis C.  Practice safe sex. Use condoms and avoid high-risk sexual practices to reduce the spread of sexually transmitted infections (STIs). STIs include gonorrhea, chlamydia, syphilis, trichomonas, herpes, HPV, and human immunodeficiency virus (HIV). Herpes, HIV, and HPV are viral illnesses that have no cure. They can result in disability, cancer, and death.  You should be screened for sexually transmitted illnesses (STIs) including gonorrhea and chlamydia if:  You are sexually active and are younger than 24 years.  You are older than 24 years and your health care provider tells you that you are at risk for this type of infection.  Your sexual activity has changed since you were last screened and you are at an increased risk for chlamydia or gonorrhea. Ask your health care provider if you are at risk.  If you are at risk of being infected with HIV, it is recommended that you take a prescription medicine daily to prevent HIV infection. This is  called preexposure prophylaxis (PrEP). You are considered at risk if:  You are a heterosexual woman, are sexually active, and are at increased risk for HIV infection.  You take drugs by injection.  You are sexually active with a partner who has HIV.  Talk with your health care provider about whether you are at high risk of being infected with HIV. If you choose to begin PrEP, you should first be tested for HIV. You should then be tested every 3 months for as long as you are taking PrEP.  Osteoporosis is a disease in which the bones lose minerals and strength   with aging. This can result in serious bone fractures or breaks. The risk of osteoporosis can be identified using a bone density scan. Women ages 65 years and over and women at risk for fractures or osteoporosis should discuss screening with their health care providers. Ask your health care provider whether you should take a calcium supplement or vitamin D to reduce the rate of osteoporosis.  Menopause can be associated with physical symptoms and risks. Hormone replacement therapy is available to decrease symptoms and risks. You should talk to your health care provider about whether hormone replacement therapy is right for you.  Use sunscreen. Apply sunscreen liberally and repeatedly throughout the day. You should seek shade when your shadow is shorter than you. Protect yourself by wearing long sleeves, pants, a wide-brimmed hat, and sunglasses year round, whenever you are outdoors.  Once a month, do a whole body skin exam, using a mirror to look at the skin on your back. Tell your health care provider of new moles, moles that have irregular borders, moles that are larger than a pencil eraser, or moles that have changed in shape or color.  Stay current with required vaccines (immunizations).  Influenza vaccine. All adults should be immunized every year.  Tetanus, diphtheria, and acellular pertussis (Td, Tdap) vaccine. Pregnant women should  receive 1 dose of Tdap vaccine during each pregnancy. The dose should be obtained regardless of the length of time since the last dose. Immunization is preferred during the 27th-36th week of gestation. An adult who has not previously received Tdap or who does not know her vaccine status should receive 1 dose of Tdap. This initial dose should be followed by tetanus and diphtheria toxoids (Td) booster doses every 10 years. Adults with an unknown or incomplete history of completing a 3-dose immunization series with Td-containing vaccines should begin or complete a primary immunization series including a Tdap dose. Adults should receive a Td booster every 10 years.  Varicella vaccine. An adult without evidence of immunity to varicella should receive 2 doses or a second dose if she has previously received 1 dose. Pregnant females who do not have evidence of immunity should receive the first dose after pregnancy. This first dose should be obtained before leaving the health care facility. The second dose should be obtained 4-8 weeks after the first dose.  Human papillomavirus (HPV) vaccine. Females aged 13-26 years who have not received the vaccine previously should obtain the 3-dose series. The vaccine is not recommended for use in pregnant females. However, pregnancy testing is not needed before receiving a dose. If a female is found to be pregnant after receiving a dose, no treatment is needed. In that case, the remaining doses should be delayed until after the pregnancy. Immunization is recommended for any person with an immunocompromised condition through the age of 26 years if she did not get any or all doses earlier. During the 3-dose series, the second dose should be obtained 4-8 weeks after the first dose. The third dose should be obtained 24 weeks after the first dose and 16 weeks after the second dose.  Zoster vaccine. One dose is recommended for adults aged 60 years or older unless certain conditions are  present.  Measles, mumps, and rubella (MMR) vaccine. Adults born before 1957 generally are considered immune to measles and mumps. Adults born in 1957 or later should have 1 or more doses of MMR vaccine unless there is a contraindication to the vaccine or there is laboratory evidence of immunity to   each of the three diseases. A routine second dose of MMR vaccine should be obtained at least 28 days after the first dose for students attending postsecondary schools, health care workers, or international travelers. People who received inactivated measles vaccine or an unknown type of measles vaccine during 1963-1967 should receive 2 doses of MMR vaccine. People who received inactivated mumps vaccine or an unknown type of mumps vaccine before 1979 and are at high risk for mumps infection should consider immunization with 2 doses of MMR vaccine. For females of childbearing age, rubella immunity should be determined. If there is no evidence of immunity, females who are not pregnant should be vaccinated. If there is no evidence of immunity, females who are pregnant should delay immunization until after pregnancy. Unvaccinated health care workers born before 1957 who lack laboratory evidence of measles, mumps, or rubella immunity or laboratory confirmation of disease should consider measles and mumps immunization with 2 doses of MMR vaccine or rubella immunization with 1 dose of MMR vaccine.  Pneumococcal 13-valent conjugate (PCV13) vaccine. When indicated, a person who is uncertain of her immunization history and has no record of immunization should receive the PCV13 vaccine. An adult aged 19 years or older who has certain medical conditions and has not been previously immunized should receive 1 dose of PCV13 vaccine. This PCV13 should be followed with a dose of pneumococcal polysaccharide (PPSV23) vaccine. The PPSV23 vaccine dose should be obtained at least 8 weeks after the dose of PCV13 vaccine. An adult aged 19  years or older who has certain medical conditions and previously received 1 or more doses of PPSV23 vaccine should receive 1 dose of PCV13. The PCV13 vaccine dose should be obtained 1 or more years after the last PPSV23 vaccine dose.  Pneumococcal polysaccharide (PPSV23) vaccine. When PCV13 is also indicated, PCV13 should be obtained first. All adults aged 65 years and older should be immunized. An adult younger than age 65 years who has certain medical conditions should be immunized. Any person who resides in a nursing home or long-term care facility should be immunized. An adult smoker should be immunized. People with an immunocompromised condition and certain other conditions should receive both PCV13 and PPSV23 vaccines. People with human immunodeficiency virus (HIV) infection should be immunized as soon as possible after diagnosis. Immunization during chemotherapy or radiation therapy should be avoided. Routine use of PPSV23 vaccine is not recommended for American Indians, [place] Natives, or people younger than 65 years unless there are medical conditions that require PPSV23 vaccine. When indicated, people who have unknown immunization and have no record of immunization should receive PPSV23 vaccine. One-time revaccination 5 years after the first dose of PPSV23 is recommended for people aged 19-64 years who have chronic kidney failure, nephrotic syndrome, asplenia, or immunocompromised conditions. People who received 1-2 doses of PPSV23 before age 65 years should receive another dose of PPSV23 vaccine at age 65 years or later if at least 5 years have passed since the previous dose. Doses of PPSV23 are not needed for people immunized with PPSV23 at or after age 65 years.  Meningococcal vaccine. Adults with asplenia or persistent complement component deficiencies should receive 2 doses of quadrivalent meningococcal conjugate (MenACWY-D) vaccine. The doses should be obtained at least 2 months apart.  Microbiologists working with certain meningococcal bacteria, military recruits, people at risk during an outbreak, and people who travel to or live in countries with a high rate of meningitis should be immunized. A first-year college student up through age   21 years who is living in a residence hall should receive a dose if she did not receive a dose on or after her 16th birthday. Adults who have certain high-risk conditions should receive one or more doses of vaccine.  Hepatitis A vaccine. Adults who wish to be protected from this disease, have certain high-risk conditions, work with hepatitis A-infected animals, work in hepatitis A research labs, or travel to or work in countries with a high rate of hepatitis A should be immunized. Adults who were previously unvaccinated and who anticipate close contact with an international adoptee during the first 60 days after arrival in the [place] from a country with a high rate of hepatitis A should be immunized.  Hepatitis B vaccine. Adults who wish to be protected from this disease, have certain high-risk conditions, may be exposed to blood or other infectious body fluids, are household contacts or sex partners of hepatitis B positive people, are clients or workers in certain care facilities, or travel to or work in countries with a high rate of hepatitis B should be immunized.  Haemophilus influenzae type b (Hib) vaccine. A previously unvaccinated person with asplenia or sickle cell disease or having a scheduled splenectomy should receive 1 dose of Hib vaccine. Regardless of previous immunization, a recipient of a hematopoietic stem cell transplant should receive a 3-dose series 6-12 months after her successful transplant. Hib vaccine is not recommended for adults with HIV infection. Preventive Services / Frequency Ages 64 to 68 years  Blood pressure check.** / Every 1 to 2 years.  Lipid and cholesterol check.** / Every 5 years beginning at age  22.  Clinical breast exam.** / Every 3 years for women in their 88s and 53s.  BRCA-related cancer risk assessment.** / For women who have family members with a BRCA-related cancer (breast, ovarian, tubal, or peritoneal cancers).  Pap test.** / Every 2 years from ages 90 through 51. Every 3 years starting at age 21 through age 56 or 3 with a history of 3 consecutive normal Pap tests.  HPV screening.** / Every 3 years from ages 24 through ages 1 to 46 with a history of 3 consecutive normal Pap tests.  Hepatitis C blood test.** / For any individual with known risks for hepatitis C.  Skin self-exam. / Monthly.  Influenza vaccine. / Every year.  Tetanus, diphtheria, and acellular pertussis (Tdap, Td) vaccine.** / Consult your health care provider. Pregnant women should receive 1 dose of Tdap vaccine during each pregnancy. 1 dose of Td every 10 years.  Varicella vaccine.** / Consult your health care provider. Pregnant females who do not have evidence of immunity should receive the first dose after pregnancy.  HPV vaccine. / 3 doses over 6 months, if 72 and younger. The vaccine is not recommended for use in pregnant females. However, pregnancy testing is not needed before receiving a dose.  Measles, mumps, rubella (MMR) vaccine.** / You need at least 1 dose of MMR if you were born in 1957 or later. You may also need a 2nd dose. For females of childbearing age, rubella immunity should be determined. If there is no evidence of immunity, females who are not pregnant should be vaccinated. If there is no evidence of immunity, females who are pregnant should delay immunization until after pregnancy.  Pneumococcal 13-valent conjugate (PCV13) vaccine.** / Consult your health care provider.  Pneumococcal polysaccharide (PPSV23) vaccine.** / 1 to 2 doses if you smoke cigarettes or if you have certain conditions.  Meningococcal vaccine.** /  1 dose if you are age 19 to 21 years and a first-year college  student living in a residence hall, or have one of several medical conditions, you need to get vaccinated against meningococcal disease. You may also need additional booster doses.  Hepatitis A vaccine.** / Consult your health care provider.  Hepatitis B vaccine.** / Consult your health care provider.  Haemophilus influenzae type b (Hib) vaccine.** / Consult your health care provider. Ages 40 to 64 years  Blood pressure check.** / Every 1 to 2 years.  Lipid and cholesterol check.** / Every 5 years beginning at age 20 years.  Lung cancer screening. / Every year if you are aged 55-80 years and have a 30-pack-year history of smoking and currently smoke or have quit within the past 15 years. Yearly screening is stopped once you have quit smoking for at least 15 years or develop a health problem that would prevent you from having lung cancer treatment.  Clinical breast exam.** / Every year after age 40 years.  BRCA-related cancer risk assessment.** / For women who have family members with a BRCA-related cancer (breast, ovarian, tubal, or peritoneal cancers).  Mammogram.** / Every year beginning at age 40 years and continuing for as long as you are in good health. Consult with your health care provider.  Pap test.** / Every 3 years starting at age 30 years through age 65 or 70 years with a history of 3 consecutive normal Pap tests.  HPV screening.** / Every 3 years from ages 30 years through ages 65 to 70 years with a history of 3 consecutive normal Pap tests.  Fecal occult blood test (FOBT) of stool. / Every year beginning at age 50 years and continuing until age 75 years. You may not need to do this test if you get a colonoscopy every 10 years.  Flexible sigmoidoscopy or colonoscopy.** / Every 5 years for a flexible sigmoidoscopy or every 10 years for a colonoscopy beginning at age 50 years and continuing until age 75 years.  Hepatitis C blood test.** / For all people born from 1945 through  1965 and any individual with known risks for hepatitis C.  Skin self-exam. / Monthly.  Influenza vaccine. / Every year.  Tetanus, diphtheria, and acellular pertussis (Tdap/Td) vaccine.** / Consult your health care provider. Pregnant women should receive 1 dose of Tdap vaccine during each pregnancy. 1 dose of Td every 10 years.  Varicella vaccine.** / Consult your health care provider. Pregnant females who do not have evidence of immunity should receive the first dose after pregnancy.  Zoster vaccine.** / 1 dose for adults aged 60 years or older.  Measles, mumps, rubella (MMR) vaccine.** / You need at least 1 dose of MMR if you were born in 1957 or later. You may also need a 2nd dose. For females of childbearing age, rubella immunity should be determined. If there is no evidence of immunity, females who are not pregnant should be vaccinated. If there is no evidence of immunity, females who are pregnant should delay immunization until after pregnancy.  Pneumococcal 13-valent conjugate (PCV13) vaccine.** / Consult your health care provider.  Pneumococcal polysaccharide (PPSV23) vaccine.** / 1 to 2 doses if you smoke cigarettes or if you have certain conditions.  Meningococcal vaccine.** / Consult your health care provider.  Hepatitis A vaccine.** / Consult your health care provider.  Hepatitis B vaccine.** / Consult your health care provider.  Haemophilus influenzae type b (Hib) vaccine.** / Consult your health care provider. Ages 65   years and over  Blood pressure check.** / Every 1 to 2 years.  Lipid and cholesterol check.** / Every 5 years beginning at age 45 years.  Lung cancer screening. / Every year if you are aged 30-80 years and have a 30-pack-year history of smoking and currently smoke or have quit within the past 15 years. Yearly screening is stopped once you have quit smoking for at least 15 years or develop a health problem that would prevent you from having lung cancer  treatment.  Clinical breast exam.** / Every year after age 11 years.  BRCA-related cancer risk assessment.** / For women who have family members with a BRCA-related cancer (breast, ovarian, tubal, or peritoneal cancers).  Mammogram.** / Every year beginning at age 53 years and continuing for as long as you are in good health. Consult with your health care provider.  Pap test.** / Every 3 years starting at age 42 years through age 64 or 69 years with 3 consecutive normal Pap tests. Testing can be stopped between 65 and 70 years with 3 consecutive normal Pap tests and no abnormal Pap or HPV tests in the past 10 years.  HPV screening.** / Every 3 years from ages 10 years through ages 80 or 59 years with a history of 3 consecutive normal Pap tests. Testing can be stopped between 65 and 70 years with 3 consecutive normal Pap tests and no abnormal Pap or HPV tests in the past 10 years.  Fecal occult blood test (FOBT) of stool. / Every year beginning at age 67 years and continuing until age 41 years. You may not need to do this test if you get a colonoscopy every 10 years.  Flexible sigmoidoscopy or colonoscopy.** / Every 5 years for a flexible sigmoidoscopy or every 10 years for a colonoscopy beginning at age 7 years and continuing until age 53 years.  Hepatitis C blood test.** / For all people born from [birth year] through 1965 and any individual with known risks for hepatitis C.  Osteoporosis screening.** / A one-time screening for women ages 28 years and over and women at risk for fractures or osteoporosis.  Skin self-exam. / Monthly.  Influenza vaccine. / Every year.  Tetanus, diphtheria, and acellular pertussis (Tdap/Td) vaccine.** / 1 dose of Td every 10 years.  Varicella vaccine.** / Consult your health care provider.  Zoster vaccine.** / 1 dose for adults aged 58 years or older.  Pneumococcal 13-valent conjugate (PCV13) vaccine.** / Consult your health care provider.  Pneumococcal  polysaccharide (PPSV23) vaccine.** / 1 dose for all adults aged 44 years and older.  Meningococcal vaccine.** / Consult your health care provider.  Hepatitis A vaccine.** / Consult your health care provider.  Hepatitis B vaccine.** / Consult your health care provider.  Haemophilus influenzae type b (Hib) vaccine.** / Consult your health care provider. ** Family history and personal history of risk and conditions may change your health care provider's recommendations. Document Released: 12/21/2001 Document Revised: 03/11/2014 Document Reviewed: 03/22/2011 [facility] Patient Information 2015 [place], [place]. This information is not intended to replace advice given to you by your health care provider. Make sure you discuss any questions you have with your health care provider. Back Pain, Adult Low back pain is very common. About 1 in 5 people have back pain.The cause of low back pain is rarely dangerous. The pain often gets better over time.About half of people with a sudden onset of back pain feel better in just 2 weeks. About 8 in 10 people feel  better by 6 weeks.  CAUSES Some common causes of back pain include:  Strain of the muscles or ligaments supporting the spine.  Wear and tear (degeneration) of the spinal discs.  Arthritis.  Direct injury to the back. DIAGNOSIS Most of the time, the direct cause of low back pain is not known.However, back pain can be treated effectively even when the exact cause of the pain is unknown.Answering your caregiver's questions about your overall health and symptoms is one of the most accurate ways to make sure the cause of your pain is not dangerous. If your caregiver needs more information, he or she may order lab work or imaging tests (X-rays or MRIs).However, even if imaging tests show changes in your back, this usually does not require surgery. HOME CARE INSTRUCTIONS For many people, back pain returns.Since low back pain is rarely dangerous, it is  often a condition that people can learn to [facility] their own.   Remain active. It is stressful on the back to sit or stand in one place. Do not sit, drive, or stand in one place for more than 30 minutes at a time. Take short walks on level surfaces as soon as pain allows.Try to increase the length of time you walk each day.  Do not stay in bed.Resting more than 1 or 2 days can delay your recovery.  Do not avoid exercise or work.Your body is made to move.It is not dangerous to be active, even though your back may hurt.Your back will likely heal faster if you return to being active before your pain is gone.  Pay attention to your body when you bend and lift. Many people have less discomfortwhen lifting if they bend their knees, keep the load close to their bodies,and avoid twisting. Often, the most comfortable positions are those that put less stress on your recovering back.  Find a comfortable position to sleep. Use a firm mattress and lie on your side with your knees slightly bent. If you lie on your back, put a pillow under your knees.  Only take over-the-counter or prescription medicines as directed by your caregiver. Over-the-counter medicines to reduce pain and inflammation are often the most helpful.Your caregiver may prescribe muscle relaxant drugs.These medicines help dull your pain so you can more quickly return to your normal activities and healthy exercise.  Put ice on the injured area.  Put ice in a plastic bag.  Place a towel between your skin and the bag.  Leave the ice on for 15-20 minutes, 03-04 times a day for the first 2 to 3 days. After that, ice and heat may be alternated to reduce pain and spasms.  Ask your caregiver about trying back exercises and gentle massage. This may be of some benefit.  Avoid feeling anxious or stressed.Stress increases muscle tension and can worsen back pain.It is important to recognize when you are anxious or stressed and learn ways  to manage it.Exercise is a great option. SEEK MEDICAL CARE IF:  You have pain that is not relieved with rest or medicine.  You have pain that does not improve in 1 week.  You have new symptoms.  You are generally not feeling well. SEEK IMMEDIATE MEDICAL CARE IF:   You have pain that radiates from your back into your legs.  You develop new bowel or bladder control problems.  You have unusual weakness or numbness in your arms or legs.  You develop nausea or vomiting.  You develop abdominal pain.  You feel faint.  Document Released: 10/25/2005 Document Revised: 04/25/2012 Document Reviewed: 02/26/2014 [facility] Patient Information 2015 [place], [place]. This information is not intended to replace advice given to you by your health care provider. Make sure you discuss any questions you have with your health care provider.

## 2014-08-25 ENCOUNTER — Encounter: Payer: Self-pay | Admitting: Internal Medicine

## 2014-08-25 DIAGNOSIS — M170 Bilateral primary osteoarthritis of knee: Secondary | ICD-10-CM | POA: Insufficient documentation

## 2014-08-25 NOTE — Assessment & Plan Note (Signed)
She will start K+ replacement therapy

## 2014-08-25 NOTE — Assessment & Plan Note (Signed)
Will increase the dose of tramadol at her request

## 2014-08-25 NOTE — Assessment & Plan Note (Signed)
Will cont topical steroids as needed

## 2014-08-26 LAB — TB SKIN TEST
Induration: 0 mm
TB SKIN TEST: NEGATIVE

## 2014-08-26 MED ORDER — DESONIDE 0.05 % EX LOTN: TOPICAL_LOTION | CUTANEOUS | Status: DC

## 2014-08-26 NOTE — Addendum Note (Signed)
Addended by: [name] on: 08/26/2014 01:29 PM   Modules accepted: Orders

## 2014-08-28 LAB — CYTOLOGY - PAP

## 2014-08-28 LAB — HM PAP SMEAR: HM PAP: NORMAL

## 2014-09-09 ENCOUNTER — Telehealth: Payer: Self-pay | Admitting: Internal Medicine

## 2014-09-09 NOTE — Telephone Encounter (Signed)
Tried calling pt husband back lady stated that this is his job & he has left. Called # on file automatic recording states not receiving msg 's at this time can not leave msg...[name]

## 2014-09-09 NOTE — Telephone Encounter (Signed)
Patients spouse is requesting a call back.  States wife has a cold and would like something called in.  I did tell spouse that patient would need to be seen.  He still insisted on a call back in regards.

## 2014-09-10 NOTE — Telephone Encounter (Signed)
Tried calling pt again can't leave msg. Closing note pt need appt for cold sxs...[name]

## 2014-10-02 ENCOUNTER — Telehealth: Payer: Self-pay | Admitting: Internal Medicine

## 2014-10-02 NOTE — Telephone Encounter (Signed)
Pease advise.

## 2014-10-02 NOTE — Telephone Encounter (Signed)
Patient at work. She has had swelling in fingers for the past week.  Should she make appointment with you or her hand surgeon?

## 2014-10-02 NOTE — Telephone Encounter (Signed)
Either way is fine with me

## 2014-11-14 ENCOUNTER — Telehealth: Payer: Self-pay | Admitting: Internal Medicine

## 2014-11-14 NOTE — Telephone Encounter (Signed)
Pt wants a stronger medication, she is taking tramadol for her leg pain. Now are pain and wants to continue taking tramadol and another pain med that is stronger.  [phone]

## 2014-11-19 ENCOUNTER — Telehealth: Payer: Self-pay | Admitting: Internal Medicine

## 2014-11-19 NOTE — Telephone Encounter (Signed)
na

## 2014-11-19 NOTE — Telephone Encounter (Signed)
Pt is requesting to talk to [place] about her arm pain, tramadol has helped her leg pain but not her arm pain.

## 2014-11-20 ENCOUNTER — Telehealth: Payer: Self-pay | Admitting: *Deleted

## 2014-11-20 NOTE — Telephone Encounter (Signed)
[place] Night - Client TELEPHONE [facility] Patient Name: [name] Gender: Female DOB: [birth year] Age: 54 Y 21 M 26 D Return Phone Number: [phone] (Primary) Address: [address] City/State/Zip: [place] Night - Client Client Site [facility] - Night Physician [place], [place] Type Call Call Type Triage / Clinical Caller Name Shaima Sardinas Relationship To Patient Self Return Phone Number [phone] (Primary) Chief Complaint Arm Pain Initial Comment Caller states has arm pain PreDisposition Home Care Nurse Assessment Nurse: Barbera Setters, RN, Clarene Critchley Date/Time (Eastern Time): 11/20/2014 6:28:37 AM Confirm and document reason for call. If symptomatic, describe symptoms. ---Caller states has left arm pain at the elbow and no strength Has the patient traveled out of the country within the last 30 days? ---No Does the patient require triage? ---Yes Related visit to physician within the last 2 weeks? ---No Does the PT have any chronic conditions? (i.e. diabetes, asthma, etc.) ---Yes List chronic conditions. ---chronic pain Did the patient indicate they were pregnant? ---No Guidelines Guideline Title Affirmed Question Affirmed Notes Nurse Date/Time Eilene Ghazi Time) Elbow Pain Weakness (i.e., loss of strength) in hand or fingers (Exception: not truly weak; hand feels weak because of pain) Pringle, RN, Clarene Critchley 11/20/2014 6:30:15 AM Disp. Time Eilene Ghazi Time) Disposition Final User 11/20/2014 6:33:31 AM See Physician within 4 Hours (or PCP triage) Yes Pringle, RN, Alisa Graff Understands: Yes Disagree/Comply: Comply PLEASE NOTE: All timestamps contained within this report are represented as [place] Standard Time. CONFIDENTIALTY NOTICE: This fax transmission is intended only for the addressee. It contains information that is legally privileged, confidential or otherwise  protected from use or disclosure. If you are not the intended recipient, you are strictly prohibited from reviewing, disclosing, copying using or disseminating any of this information or taking any action in reliance on or regarding this information. If you have received this fax in error, please notify us immediately by telephone so that we can arrange for its return to [name]. Phone: [phone], Toll-Free: [phone], Fax: [phone] Page: 2 of 2 Call Id: [id number] Care Advice Given Per Guideline SEE PHYSICIAN WITHIN 4 HOURS (or PCP triage): * IF NO PCP TRIAGE: You need to be seen. Go to _______________ (ED/UCC or office if it will be open) within the next 3 or 4 hours. Go sooner if you become worse. ACETAMINOPHEN (E.G., TYLENOL): * You become worse. CARE ADVICE given per Elbow Pain (Adult) guideline. After Care Instructions Given Call Event Type User Date / Time Description Referrals REFERRED TO PCP OFFICE

## 2014-12-09 ENCOUNTER — Telehealth: Payer: Self-pay | Admitting: Internal Medicine

## 2014-12-09 DIAGNOSIS — M5416 Radiculopathy, lumbar region: Secondary | ICD-10-CM

## 2014-12-09 MED ORDER — TRAMADOL HCL 50 MG PO TABS: 50.0000 mg | ORAL_TABLET | Freq: Four times a day (QID) | ORAL | Status: DC | PRN

## 2014-12-09 NOTE — Telephone Encounter (Signed)
done

## 2014-12-09 NOTE — Telephone Encounter (Signed)
Notified pt rx fax back to [facility]...[name]

## 2014-12-09 NOTE — Telephone Encounter (Signed)
Pt called in requesting refill on her Tramodal?    Last time it was filled was in Oct 2015

## 2014-12-11 ENCOUNTER — Ambulatory Visit (INDEPENDENT_AMBULATORY_CARE_PROVIDER_SITE_OTHER)
Admission: RE | Admit: 2014-12-11 | Discharge: 2014-12-11 | Disposition: A | Discharge status: Home / Self Care | Payer: 59 | Source: Ambulatory Visit | Attending: Internal Medicine | Admitting: Internal Medicine

## 2014-12-11 ENCOUNTER — Ambulatory Visit (INDEPENDENT_AMBULATORY_CARE_PROVIDER_SITE_OTHER): Payer: 59 | Admitting: Internal Medicine

## 2014-12-11 ENCOUNTER — Encounter: Payer: Self-pay | Admitting: Internal Medicine

## 2014-12-11 VITALS — BP 130/90 | HR 72 | Temp 98.5°F | Resp 16 | Ht 61.5 in | Wt 206.0 lb

## 2014-12-11 DIAGNOSIS — M5126 Other intervertebral disc displacement, lumbar region: Secondary | ICD-10-CM

## 2014-12-11 DIAGNOSIS — M549 Dorsalgia, unspecified: Secondary | ICD-10-CM

## 2014-12-11 MED ORDER — HYDROCODONE-ACETAMINOPHEN 10-325 MG PO TABS: 1.0000 | ORAL_TABLET | Freq: Three times a day (TID) | ORAL | Status: DC | PRN

## 2014-12-11 NOTE — Patient Instructions (Signed)
Back Pain, Adult Low back pain is very common. About 1 in 5 people have back pain.The cause of low back pain is rarely dangerous. The pain often gets better over time.About half of people with a sudden onset of back pain feel better in just 2 weeks. About 8 in 10 people feel better by 6 weeks.  CAUSES Some common causes of back pain include:  Strain of the muscles or ligaments supporting the spine.  Wear and tear (degeneration) of the spinal discs.  Arthritis.  Direct injury to the back. DIAGNOSIS Most of the time, the direct cause of low back pain is not known.However, back pain can be treated effectively even when the exact cause of the pain is unknown.Answering your caregiver's questions about your overall health and symptoms is one of the most accurate ways to make sure the cause of your pain is not dangerous. If your caregiver needs more information, he or she may order lab work or imaging tests (X-rays or MRIs).However, even if imaging tests show changes in your back, this usually does not require surgery. HOME CARE INSTRUCTIONS For many people, back pain returns.Since low back pain is rarely dangerous, it is often a condition that people can learn to manageon their own.   Remain active. It is stressful on the back to sit or stand in one place. Do not sit, drive, or stand in one place for more than 30 minutes at a time. Take short walks on level surfaces as soon as pain allows.Try to increase the length of time you walk each day.  Do not stay in bed.Resting more than 1 or 2 days can delay your recovery.  Do not avoid exercise or work.Your body is made to move.It is not dangerous to be active, even though your back may hurt.Your back will likely heal faster if you return to being active before your pain is gone.  Pay attention to your body when you bend and lift. Many people have less discomfortwhen lifting if they bend their knees, keep the load close to their bodies,and  avoid twisting. Often, the most comfortable positions are those that put less stress on your recovering back.  Find a comfortable position to sleep. Use a firm mattress and lie on your side with your knees slightly bent. If you lie on your back, put a pillow under your knees.  Only take over-the-counter or prescription medicines as directed by your caregiver. Over-the-counter medicines to reduce pain and inflammation are often the most helpful.Your caregiver may prescribe muscle relaxant drugs.These medicines help dull your pain so you can more quickly return to your normal activities and healthy exercise.  Put ice on the injured area.  Put ice in a plastic bag.  Place a towel between your skin and the bag.  Leave the ice on for 15-20 minutes, 03-04 times a day for the first 2 to 3 days. After that, ice and heat may be alternated to reduce pain and spasms.  Ask your caregiver about trying back exercises and gentle massage. This may be of some benefit.  Avoid feeling anxious or stressed.Stress increases muscle tension and can worsen back pain.It is important to recognize when you are anxious or stressed and learn ways to manage it.Exercise is a great option. SEEK MEDICAL CARE IF:  You have pain that is not relieved with rest or medicine.  You have pain that does not improve in 1 week.  You have new symptoms.  You are generally not feeling well. SEEK   IMMEDIATE MEDICAL CARE IF:   You have pain that radiates from your back into your legs.  You develop new bowel or bladder control problems.  You have unusual weakness or numbness in your arms or legs.  You develop nausea or vomiting.  You develop abdominal pain.  You feel faint. Document Released: 10/25/2005 Document Revised: 04/25/2012 Document Reviewed: 02/26/2014 [employer] Patient Information 2015 [employer]. This information is not intended to replace advice given to you by your health care provider. Make sure you  discuss any questions you have with your health care provider.  

## 2014-12-11 NOTE — Progress Notes (Signed)
Subjective:    Patient ID: [name], female    DOB: September 19, 1961, 54 y.o.   MRN: [id number]  Back Pain This is a new problem. The current episode started in the past 7 days. The problem occurs intermittently. The problem is unchanged. The pain is present in the lumbar spine. The quality of the pain is described as stabbing. The pain does not radiate. The pain is at a severity of 10/10. The pain is severe. The pain is worse during the day. The symptoms are aggravated by bending and standing. Stiffness is present all day. Pertinent negatives include no abdominal pain, bladder incontinence, bowel incontinence, chest pain, dysuria, fever, headaches, leg pain, numbness, paresis, paresthesias, pelvic pain, perianal numbness, tingling, weakness or weight loss. Risk factors include obesity and lack of exercise (heavy lifting). She has tried NSAIDs and analgesics for the symptoms. The treatment provided moderate relief.      Review of Systems  Constitutional: Negative.  Negative for fever, chills, weight loss, diaphoresis, appetite change and fatigue.  HENT: Negative.   Eyes: Negative.   Respiratory: Negative.  Negative for cough, choking, chest tightness, shortness of breath and stridor.   Cardiovascular: Negative.  Negative for chest pain, palpitations and leg swelling.  Gastrointestinal: Negative.  Negative for nausea, vomiting, abdominal pain, diarrhea, constipation, blood in stool and bowel incontinence.  Endocrine: Negative.   Genitourinary: Negative.  Negative for bladder incontinence, dysuria and pelvic pain.  Musculoskeletal: Positive for back pain. Negative for myalgias, joint swelling, arthralgias, gait problem, neck pain and neck stiffness.  Skin: Negative.  Negative for rash.  Allergic/Immunologic: Negative.   Neurological: Negative.  Negative for tingling, weakness, numbness, headaches and paresthesias.  Hematological: Negative.  Negative for adenopathy. Does not bruise/bleed  easily.  Psychiatric/Behavioral: Negative.        Objective:   Physical Exam  Constitutional: She is oriented to person, place, and time. She appears well-developed and well-nourished. No distress.  HENT:  Head: Normocephalic and atraumatic.  Mouth/Throat: Oropharynx is clear and moist. No oropharyngeal exudate.  Eyes: Conjunctivae are normal. Right eye exhibits no discharge. Left eye exhibits no discharge. No scleral icterus.  Neck: Normal range of motion. Neck supple. No JVD present. No tracheal deviation present. No thyromegaly present.  Cardiovascular: Normal rate, regular rhythm, normal heart sounds and intact distal pulses.  Exam reveals no gallop and no friction rub.   No murmur heard. Pulmonary/Chest: Effort normal and breath sounds normal. No stridor. No respiratory distress. She has no wheezes. She has no rales. She exhibits no tenderness.  Abdominal: Soft. Bowel sounds are normal. She exhibits no distension and no mass. There is no tenderness. There is no rebound and no guarding.  Musculoskeletal: Normal range of motion. She exhibits no edema or tenderness.       Lumbar back: She exhibits bony tenderness. She exhibits normal range of motion, no swelling, no edema, no deformity, no laceration, no pain, no spasm and normal pulse.       Back:  Lymphadenopathy:    She has no cervical adenopathy.  Neurological: She is alert and oriented to person, place, and time. She has normal strength. She displays no atrophy, no tremor and normal reflexes. No cranial nerve deficit or sensory deficit. She exhibits normal muscle tone. She displays a negative Romberg sign. She displays no seizure activity. Coordination and gait normal.  Reflex Scores:      Tricep reflexes are 1+ on the right side and 1+ on the left side.  Bicep reflexes are 1+ on the right side and 1+ on the left side.      Brachioradialis reflexes are 1+ on the right side and 1+ on the left side.      Patellar reflexes are 1+  on the right side and 1+ on the left side.      Achilles reflexes are 1+ on the right side and 1+ on the left side. Neg SLR in BLE  Skin: Skin is warm and dry. No rash noted. She is not diaphoretic. No erythema. No pallor.  Vitals reviewed.         Assessment & Plan:

## 2014-12-11 NOTE — Assessment & Plan Note (Signed)
She has had the gradual onset of severe LBP after some heavy lifting Her neuro exam is normal I gave her an injection of depo-medrol IM to help relieve the pain and inflammation Will cont nsaids Tramadol is not controlling her pain, will advance to norco

## 2014-12-11 NOTE — Assessment & Plan Note (Signed)
She it tender so will check plain films to see if there is a fracture She will cont the nsaids, will try norco for the pain as well

## 2014-12-12 ENCOUNTER — Encounter: Payer: Self-pay | Admitting: Internal Medicine

## 2015-01-13 ENCOUNTER — Telehealth: Payer: Self-pay | Admitting: Internal Medicine

## 2015-01-13 DIAGNOSIS — M5126 Other intervertebral disc displacement, lumbar region: Secondary | ICD-10-CM

## 2015-01-13 DIAGNOSIS — M549 Dorsalgia, unspecified: Secondary | ICD-10-CM

## 2015-01-13 MED ORDER — HYDROCODONE-ACETAMINOPHEN 10-325 MG PO TABS: 1.0000 | ORAL_TABLET | Freq: Three times a day (TID) | ORAL | Status: DC | PRN

## 2015-01-13 NOTE — Telephone Encounter (Signed)
Patient requesting refill of HYDROcodone-acetaminophen (NORCO) 10-325 MG per tablet [[id number]] sent to [facility] on [address]

## 2015-01-13 NOTE — Telephone Encounter (Signed)
Returned call to pt, per VM unable to accept call. Rx left up front

## 2015-01-13 NOTE — Telephone Encounter (Signed)
done

## 2015-02-13 ENCOUNTER — Telehealth: Payer: Self-pay | Admitting: *Deleted

## 2015-02-13 DIAGNOSIS — M5126 Other intervertebral disc displacement, lumbar region: Secondary | ICD-10-CM

## 2015-02-13 DIAGNOSIS — M549 Dorsalgia, unspecified: Secondary | ICD-10-CM

## 2015-02-13 MED ORDER — HYDROCODONE-ACETAMINOPHEN 10-325 MG PO TABS: 1.0000 | ORAL_TABLET | Freq: Three times a day (TID) | ORAL | Status: DC | PRN

## 2015-02-13 NOTE — Telephone Encounter (Signed)
done

## 2015-02-13 NOTE — Telephone Encounter (Signed)
Called pt to inform Rx ready for pick up. Rx placed upfront.

## 2015-02-13 NOTE — Telephone Encounter (Signed)
Left msg on triage requesting refill on hydrocodone.../lmb 

## 2015-02-20 ENCOUNTER — Other Ambulatory Visit: Payer: Self-pay | Admitting: *Deleted

## 2015-02-20 DIAGNOSIS — L309 Dermatitis, unspecified: Secondary | ICD-10-CM

## 2015-02-20 MED ORDER — TRIAMCINOLONE ACETONIDE 0.5 % EX CREA: TOPICAL_CREAM | Freq: Three times a day (TID) | CUTANEOUS | Status: DC

## 2015-02-20 NOTE — Telephone Encounter (Signed)
Pt requesting refill on triamcinolone cream due to the pollen the eczema is acting back up. Inform pt will send...[name]

## 2015-02-25 ENCOUNTER — Telehealth: Payer: Self-pay | Admitting: Internal Medicine

## 2015-02-25 NOTE — Telephone Encounter (Signed)
Patient has been having a pain in her right side for about a week. It feels like she is having bad menstrual cramps but she hasn't had her period in years. She is wondering if she should come in to see Dr. Ronnald Ramp or if you think she should go to the [facility] for this.

## 2015-02-26 NOTE — Telephone Encounter (Signed)
lmovm advising pt per MD

## 2015-02-26 NOTE — Telephone Encounter (Signed)
GYN would be a good option

## 2015-03-03 ENCOUNTER — Other Ambulatory Visit: Payer: Self-pay | Admitting: Nurse Practitioner

## 2015-03-03 DIAGNOSIS — R1031 Right lower quadrant pain: Secondary | ICD-10-CM

## 2015-03-07 ENCOUNTER — Encounter (HOSPITAL_COMMUNITY): Payer: Self-pay

## 2015-03-07 ENCOUNTER — Ambulatory Visit (HOSPITAL_COMMUNITY)
Admission: RE | Admit: 2015-03-07 | Discharge: 2015-03-07 | Disposition: A | Discharge status: Home / Self Care | Payer: 59 | Source: Ambulatory Visit | Attending: Nurse Practitioner | Admitting: Nurse Practitioner

## 2015-03-07 DIAGNOSIS — I700 Atherosclerosis of aorta: Secondary | ICD-10-CM | POA: Insufficient documentation

## 2015-03-07 DIAGNOSIS — R110 Nausea: Secondary | ICD-10-CM | POA: Diagnosis not present

## 2015-03-07 DIAGNOSIS — R1031 Right lower quadrant pain: Secondary | ICD-10-CM | POA: Diagnosis present

## 2015-03-07 MED ORDER — IOHEXOL 300 MG/ML  SOLN
100.0000 mL | Freq: Once | INTRAMUSCULAR | Status: AC | PRN
  Administered 2015-03-07: 100 mL via INTRAVENOUS

## 2015-03-11 ENCOUNTER — Telehealth: Payer: Self-pay | Admitting: Internal Medicine

## 2015-03-11 DIAGNOSIS — M5126 Other intervertebral disc displacement, lumbar region: Secondary | ICD-10-CM

## 2015-03-11 DIAGNOSIS — M549 Dorsalgia, unspecified: Secondary | ICD-10-CM

## 2015-03-11 MED ORDER — HYDROCODONE-ACETAMINOPHEN 10-325 MG PO TABS: 1.0000 | ORAL_TABLET | Freq: Three times a day (TID) | ORAL | Status: DC | PRN

## 2015-03-11 NOTE — Telephone Encounter (Signed)
Patient needs refill for HYDROcodone-acetaminophen (NORCO) 10-325 MG per tablet [[id number]]

## 2015-03-11 NOTE — Telephone Encounter (Signed)
done

## 2015-03-28 ENCOUNTER — Telehealth: Payer: Self-pay | Admitting: Internal Medicine

## 2015-03-28 DIAGNOSIS — M5126 Other intervertebral disc displacement, lumbar region: Secondary | ICD-10-CM

## 2015-03-28 DIAGNOSIS — M170 Bilateral primary osteoarthritis of knee: Secondary | ICD-10-CM

## 2015-03-28 NOTE — Telephone Encounter (Signed)
Patient given script for hydrocodone on 03/11/15, will have to address when MD returns.

## 2015-03-28 NOTE — Telephone Encounter (Signed)
Patient is requesting a script for tramadol to be sent to [facility] in [place].

## 2015-04-01 MED ORDER — TRAMADOL HCL 50 MG PO TABS: 50.0000 mg | ORAL_TABLET | Freq: Four times a day (QID) | ORAL | Status: DC | PRN

## 2015-04-01 NOTE — Telephone Encounter (Signed)
Pt stated that the tramadol is working better for her and does not want the hydrocodone any more. Informed pt that I would send message to PCP for advisement.

## 2015-04-01 NOTE — Telephone Encounter (Signed)
Faxed rx to [facility] pt it was faxed over.

## 2015-04-01 NOTE — Telephone Encounter (Signed)
Patient came in asking about script for tramadol.  Patient can be reached at [phone]

## 2015-04-01 NOTE — Telephone Encounter (Signed)
changed

## 2015-04-01 NOTE — Telephone Encounter (Signed)
She is on norco for pain I don't prescribe norco and tramadol together

## 2015-04-22 ENCOUNTER — Telehealth: Payer: Self-pay | Admitting: Internal Medicine

## 2015-04-22 DIAGNOSIS — M549 Dorsalgia, unspecified: Secondary | ICD-10-CM

## 2015-04-22 NOTE — Telephone Encounter (Signed)
She has been referred to pain management

## 2015-04-22 NOTE — Telephone Encounter (Signed)
Per 03/28/15 phone note, pt requested tramadol instead of hydrocodone stating that it controlled her pain better. Hydrocodone does not show as an active medication on her list, only tramadol. Spoke with pt who stated that a MD told her that tramadol was for her leg pain and not her back. Patient is requesting what else can help that does not involve medication. Thanks

## 2015-04-22 NOTE — Telephone Encounter (Signed)
Pt called in said that she is still having pain in her back and she can not take the Hydrocodone.  She is not taking this anymore she says.  She wants to know if there is anything else that can be called in?

## 2015-04-23 NOTE — Telephone Encounter (Signed)
Patient notified

## 2015-07-09 ENCOUNTER — Encounter: Payer: Self-pay | Admitting: Physical Medicine & Rehabilitation

## 2015-07-09 ENCOUNTER — Ambulatory Visit: Payer: 59 | Admitting: Family Medicine

## 2015-08-08 ENCOUNTER — Telehealth: Payer: Self-pay | Admitting: Physical Medicine & Rehabilitation

## 2015-08-08 ENCOUNTER — Encounter: Payer: Commercial Managed Care - HMO | Attending: Physical Medicine & Rehabilitation

## 2015-08-08 ENCOUNTER — Other Ambulatory Visit: Payer: Self-pay | Admitting: Physical Medicine & Rehabilitation

## 2015-08-08 ENCOUNTER — Encounter: Payer: Self-pay | Admitting: Physical Medicine & Rehabilitation

## 2015-08-08 ENCOUNTER — Ambulatory Visit (HOSPITAL_BASED_OUTPATIENT_CLINIC_OR_DEPARTMENT_OTHER): Payer: Commercial Managed Care - HMO | Admitting: Physical Medicine & Rehabilitation

## 2015-08-08 VITALS — BP 158/92 | HR 76

## 2015-08-08 DIAGNOSIS — Z8711 Personal history of peptic ulcer disease: Secondary | ICD-10-CM | POA: Insufficient documentation

## 2015-08-08 DIAGNOSIS — M797 Fibromyalgia: Secondary | ICD-10-CM | POA: Insufficient documentation

## 2015-08-08 DIAGNOSIS — M7071 Other bursitis of hip, right hip: Secondary | ICD-10-CM | POA: Diagnosis not present

## 2015-08-08 DIAGNOSIS — M7072 Other bursitis of hip, left hip: Secondary | ICD-10-CM | POA: Insufficient documentation

## 2015-08-08 DIAGNOSIS — M47816 Spondylosis without myelopathy or radiculopathy, lumbar region: Secondary | ICD-10-CM | POA: Insufficient documentation

## 2015-08-08 DIAGNOSIS — Z79899 Other long term (current) drug therapy: Secondary | ICD-10-CM

## 2015-08-08 DIAGNOSIS — M1288 Other specific arthropathies, not elsewhere classified, other specified site: Secondary | ICD-10-CM | POA: Diagnosis not present

## 2015-08-08 DIAGNOSIS — G894 Chronic pain syndrome: Secondary | ICD-10-CM | POA: Insufficient documentation

## 2015-08-08 DIAGNOSIS — D649 Anemia, unspecified: Secondary | ICD-10-CM | POA: Diagnosis not present

## 2015-08-08 DIAGNOSIS — Z5181 Encounter for therapeutic drug level monitoring: Secondary | ICD-10-CM

## 2015-08-08 DIAGNOSIS — J45909 Unspecified asthma, uncomplicated: Secondary | ICD-10-CM | POA: Insufficient documentation

## 2015-08-08 MED ORDER — GABAPENTIN 300 MG PO CAPS: 300.0000 mg | ORAL_CAPSULE | Freq: Three times a day (TID) | ORAL | Status: DC

## 2015-08-08 MED ORDER — PREGABALIN 75 MG PO CAPS: 75.0000 mg | ORAL_CAPSULE | Freq: Two times a day (BID) | ORAL | Status: DC

## 2015-08-08 NOTE — Patient Instructions (Signed)
I believe you have several issues going on I think you have bursitis of both hips and I have enclosed some information below In addition I do think you may have a more  Widespread pain syndrome. I will start you on a medicine for that You may benefit from hip injections At this point I do not think you need any further x-rays.

## 2015-08-08 NOTE — Telephone Encounter (Signed)
Patient saw Dr. Letta Pate today and he prescribed Lyrica for her, but it too expensive. She would like to see if he can prescribe her something cheaper.

## 2015-08-08 NOTE — Progress Notes (Signed)
Subjective:    Patient ID: [name], female    DOB: [birth year], 54 y.o.   MRN: [id number]  HPI   Chief complaint is hip pain, bilateral  55 year old female with a one-year history of bilateral hip pain. She denies any trauma or accidents. No prior surgery and the hips or in the back area. She denies any numbness or tingling or weakness in the hip area. The patient has seen her primary physician and was placed on pain medications.She takes diclofenac 75 mg twice a day and also takes tramadol 50 mg up to 4 times a day as needed when pain gets more severe. She does not like taking pain medications. 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  Also has pain walking down the steps.  Pain Inventory Average Pain 8 Pain Right Now 8 My pain is constant and tingling  In the last 24 hours, has pain interfered with the following? General activity 7 Relation with others 3 Enjoyment of life 7 What TIME of day is your pain at its worst? morning and evening Sleep (in general) Good  Pain is worse with: standing Pain improves with: rest Relief from Meds: 2  Mobility walk without assistance how many minutes can you walk? 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  no Reviewed lumbar spine x-ray from February 2016 there is anterolisthesis L4 on L5. Reviewed CT abdomen04/29/2016, focused on hip joints. No significant joint space narrowing, there were acetabular bone spurs noted primarily on the scout films Physicians involved in your care Any changes since last visit?  yes Primary care [name]   Family History    Problem Relation Age of Onset  . Arthritis Other   . Diabetes Other   . Hypertension Other   . Hypertension Mother   . Diabetes Sister   . Cancer Neg Hx   . Early death Neg Hx   . Hearing loss Neg Hx   . Heart disease Neg Hx   . Hyperlipidemia Neg Hx   . Kidney disease Neg Hx   . Stroke Neg Hx   . Hypertension Sister    Social History   Social History  . Marital Status: Married    Spouse Name: N/A  . Number of Children: N/A  . Years of Education: N/A   Social History Main Topics  . Smoking status: Never Smoker   . Smokeless tobacco: Never Used  . Alcohol Use: No  . Drug Use: No  . Sexual Activity: Yes    Birth Control/ Protection: Post-menopausal, Surgical   Other Topics Concern  . None   Social History Narrative   Past Surgical History  Procedure Laterality Date  . Tubal ligation     Past Medical History  Diagnosis Date  . Allergy     rhinitis  . Asthma   . Headache(784.0)   . Anemia   . History of stomach ulcers    BP 158/92 mmHg  Pulse 76  SpO2 97%  LMP 05/17/2012  Opioid Risk Score:   Fall Risk Score:  `1  Depression screen PHQ 2/9  Depression screen [employer] 2/9  08/08/2015 08/25/2014  Decreased Interest 3 0  Down, Depressed, Hopeless 0 0  PHQ - 2 Score 3 0  Altered sleeping 1 -  Tired, decreased energy 1 -  Change in appetite 0 -  Feeling bad or failure about yourself  0 -  Trouble concentrating 0 -  Moving slowly or fidgety/restless 3 -  Suicidal thoughts 0 -  PHQ-9 Score 8 -  Difficult doing work/chores Very difficult -      Review of Systems  Musculoskeletal: Positive for gait problem.  Neurological: Positive for weakness.  All other systems reviewed and are negative.      Objective:   Physical Exam  Constitutional: She is oriented to person, place, and time. She appears well-developed.  HENT:  Head: Normocephalic and atraumatic.  Right Ear: External ear normal.  Left Ear: External ear normal.  Eyes: Conjunctivae and EOM  are normal.  Cardiovascular: Normal rate and regular rhythm.   Pulmonary/Chest: Effort normal and breath sounds normal.  Neurological: She is alert and oriented to person, place, and time.  Psychiatric: She has a normal mood and affect.  Nursing note and vitals reviewed. Neuro:  Eyes without evidence of nystagmus  Tone is normal without evidence of spasticity Cerebellar exam shows no evidence of ataxia on finger nose finger or heel to shin testing No evidence of trunkal ataxia  Motor strength is 5/5 in bilateral deltoid, biceps, triceps, finger flexors and extensors, wrist flexors and extensors, hip flexors, knee flexors and extensors, ankle dorsiflexors, plantar flexors, invertors and evertors, toe flexors and extensors   Tenderness to palpation bilateral trochanters of the hip Decreased left greater than right hip internal rotation Negative straight leg raising test Knee has normal range of motion no evidence of effusion Knees with normal range of motion no evidence of effusion Sensation intact to light touch and pinprick bilateral upper and lower limbs Tenderness and some hypersensitivity touch left inguinal area. Tenderness right medial knee No tenderness of the elbows Tenderness in the suboccipital area upper trapezius area midback and low back area bilaterally  Lumbar range of motion 50% for flexion extension lateral bending and rotation. Tenderness along the lumbar paraspinal muscles no tenderness     Assessment & Plan:  1. Chronic hip pain,, multifactorial, appears to have bilateral trochanteric bursa situs of the hips but also has a more widespread pain syndrome most likely consistent with fibromyalgia. We'll send to physical therapy I have given her exercises for hip bursitis Trial of Lyrica 75 mg twice a day If hip pain is not much better in 1 month would do trochanteric bursa injection on the more symptomatic side  Do not think narcotic analgesic will be part of the  treatment plan given underlying diagnosis  2. Lumbar facet arthropathy does not appear to be a major pain generator the current time. General strengthening and weight loss should be helpful

## 2015-08-08 NOTE — Telephone Encounter (Signed)
Placed order for gabapentin she could use this instead

## 2015-08-09 LAB — PMP ALCOHOL METABOLITE (ETG): Ethyl Glucuronide (EtG): NEGATIVE ng/mL

## 2015-08-11 NOTE — Telephone Encounter (Signed)
Left vm message alerting her to new med sent to pharmacy

## 2015-08-14 LAB — PRESCRIPTION MONITORING PROFILE (SOLSTAS)
AMPHETAMINE/METH: NEGATIVE ng/mL
BARBITURATE SCREEN, URINE: NEGATIVE ng/mL
Benzodiazepine Screen, Urine: NEGATIVE ng/mL
Buprenorphine, Urine: NEGATIVE ng/mL
CANNABINOID SCRN UR: NEGATIVE ng/mL
CARISOPRODOL, URINE: NEGATIVE ng/mL
CREATININE, URINE: 178.79 mg/dL (ref >20.0)
Cocaine Metabolites: NEGATIVE ng/mL
ECSTASY: NEGATIVE ng/mL
Fentanyl, Ur: NEGATIVE ng/mL
Meperidine, Ur: NEGATIVE ng/mL
Methadone Screen, Urine: NEGATIVE ng/mL
NITRITES URINE, INITIAL: NEGATIVE ug/mL
OPIATE SCREEN, URINE: NEGATIVE ng/mL
OXYCODONE SCRN UR: NEGATIVE ng/mL
PH URINE, INITIAL: 5.4 pH (ref 4.5–8.9)
PROPOXYPHENE: NEGATIVE ng/mL
Tapentadol, urine: NEGATIVE ng/mL
ZOLPIDEM, URINE: NEGATIVE ng/mL

## 2015-08-14 LAB — TRAMADOL, URINE
N-DESMETHYL-CIS-TRAMADOL: 11416 ng/mL (ref <100)
Tramadol, Urine: 47547 ng/mL (ref <100)

## 2015-08-18 ENCOUNTER — Telehealth: Payer: Self-pay | Admitting: *Deleted

## 2015-08-18 DIAGNOSIS — M5126 Other intervertebral disc displacement, lumbar region: Secondary | ICD-10-CM

## 2015-08-18 DIAGNOSIS — M170 Bilateral primary osteoarthritis of knee: Secondary | ICD-10-CM

## 2015-08-18 MED ORDER — TRAMADOL HCL 50 MG PO TABS: 50.0000 mg | ORAL_TABLET | Freq: Four times a day (QID) | ORAL | Status: DC | PRN

## 2015-08-18 NOTE — Telephone Encounter (Signed)
Left msg on triage requesting refills on her tramadol...Johny Chess

## 2015-08-18 NOTE — Telephone Encounter (Signed)
Notified pt md ok refill has been fax to [place]...[name]

## 2015-08-18 NOTE — Telephone Encounter (Signed)
done

## 2015-08-20 NOTE — Progress Notes (Signed)
Urine drug screen for this encounter is consistent for prescribed medication 

## 2015-09-01 ENCOUNTER — Ambulatory Visit: Payer: Commercial Managed Care - HMO | Attending: Physical Medicine & Rehabilitation | Admitting: Physical Therapy

## 2015-09-01 DIAGNOSIS — R293 Abnormal posture: Secondary | ICD-10-CM | POA: Diagnosis present

## 2015-09-01 DIAGNOSIS — M62830 Muscle spasm of back: Secondary | ICD-10-CM | POA: Diagnosis present

## 2015-09-01 DIAGNOSIS — M24659 Ankylosis, unspecified hip: Secondary | ICD-10-CM | POA: Insufficient documentation

## 2015-09-01 DIAGNOSIS — R29898 Other symptoms and signs involving the musculoskeletal system: Secondary | ICD-10-CM | POA: Diagnosis present

## 2015-09-01 DIAGNOSIS — M25659 Stiffness of unspecified hip, not elsewhere classified: Secondary | ICD-10-CM

## 2015-09-01 DIAGNOSIS — M2560 Stiffness of unspecified joint, not elsewhere classified: Secondary | ICD-10-CM | POA: Diagnosis present

## 2015-09-01 DIAGNOSIS — M545 Low back pain: Secondary | ICD-10-CM | POA: Diagnosis present

## 2015-09-01 DIAGNOSIS — M5386 Other specified dorsopathies, lumbar region: Secondary | ICD-10-CM

## 2015-09-01 NOTE — Patient Instructions (Signed)
   Kristoffer Leamon PT, DPT, LAT, ATC  [facility] Phone: [phone]     

## 2015-09-01 NOTE — Therapy (Signed)
[place], [place], [address] Phone: [phone]   Fax:  [phone]  Physical Therapy Evaluation  Patient Details  Name: [name] MRN: [id number] Date of Birth: [birth year] Referring Provider: Dr. Lynford Citizen  Encounter Date: 09/01/2015      PT End of Session - 09/01/15 1234    Visit Number 1   Number of Visits 8   Date for PT Re-Evaluation 10/27/15   PT Start Time 1145   PT Stop Time 1230   PT Time Calculation (min) 45 min   Activity Tolerance Patient tolerated treatment well   Behavior During Therapy [facility] for tasks assessed/performed      Past Medical History  Diagnosis Date  . Allergy     rhinitis  . Asthma   . Headache(784.0)   . Anemia   . History of stomach ulcers     Past Surgical History  Procedure Laterality Date  . Tubal ligation      There were no vitals filed for this visit.  Visit Diagnosis:  Bilateral low back pain, with sciatica presence unspecified - Plan: PT plan of care cert/re-cert  Decreased ROM of lumbar spine - Plan: PT plan of care cert/re-cert  Weakness of both lower extremities - Plan: PT plan of care cert/re-cert  Muscle spasm of back - Plan: PT plan of care cert/re-cert  Abnormal posture - Plan: PT plan of care cert/re-cert  Decreased range of hip movement, unspecified laterality - Plan: PT plan of care cert/re-cert      Subjective Assessment - 09/01/15 1141    Subjective pt is a 54 y.o F with CC of low back pain and bil hip pain that has been present for couple years that started insidiously. since onset the pain has gotten worse with the hips starting the pain and spreading to the back.    Limitations Sitting;Lifting;Standing;Walking;House hold activities   How long can you sit comfortably? 30 min   How long can you stand comfortably? 10 - 15 min   How long can you walk comfortably? 10-15 min   Diagnostic tests x-ray lumbar spine and hips pt  reported finding was arthritis   Patient Stated Goals to return back to work, and decrease the pain   Currently in Pain? Yes   Pain Score 5   last took pain medication 30 min before appointment   Pain Location Back   Pain Orientation Right;Left;Lower  R>L   Pain Descriptors / Indicators Aching   Pain Type Chronic pain   Pain Radiating Towards R leg to the knee described as sharp pain   Pain Onset More than a month ago   Pain Frequency Constant   Aggravating Factors  walking, standing,    Pain Relieving Factors pain medication, ice,    Multiple Pain Sites Yes   Pain Score 3   Pain Location Hip   Pain Orientation Right;Left   Pain Descriptors / Indicators Aching;Throbbing;Sore   Pain Type Chronic pain   Pain Onset More than a month ago   Pain Frequency Constant   Aggravating Factors  walking/ standing   Pain Relieving Factors pain medication and ice            [facility] PT Assessment - 09/01/15 1142    Assessment   Medical Diagnosis lumbar facet athropathy, and bil hip bursitis   Referring Provider Dr. Lynford Citizen   Onset Date/Surgical Date --  the last couple of years   Hand Dominance Right  Next MD Visit 09/09/2015   Prior Therapy no   Precautions   Precautions None   Restrictions   Weight Bearing Restrictions Yes   Other Position/Activity Restrictions no lifting over 10#,   pt reported difficulty remember what restrictions were   Balance Screen   Has the patient fallen in the past 6 months No   Has the patient had a decrease in activity level because of a fear of falling?  No   Is the patient reluctant to leave their home because of a fear of falling?  No   Home Environment   Living Environment Private residence   Living Arrangements Spouse/significant other   Available Help at Discharge Available PRN/intermittently;Available 24 hours/day   Type of Home Apartment   Home Access Stairs to enter   Entrance Stairs-Number of Steps 15   Entrance Stairs-Rails Right    Home Layout One level   Prior Function   Level of Independence Independent;Independent with basic ADLs   Vocation Unemployed   Leisure being outside   Cognition   Overall Cognitive Status Within Functional Limits for tasks assessed   Observation/Other Assessments   Focus on Therapeutic Outcomes (FOTO)  50% limited  predicted 40% limited   Posture/Postural Control   Posture/Postural Control Postural limitations   Postural Limitations Increased lumbar lordosis;Forward head;Rounded Shoulders;Anterior pelvic tilt   ROM / Strength   AROM / PROM / Strength AROM;Strength;PROM   AROM   AROM Assessment Site Lumbar;Hip   Right/Left Hip Right;Left   Right Hip Flexion 68  with knee bent, pain during movement   Right Hip External Rotation  15  pain during movement   Right Hip ABduction 15   pain during testing   Right Hip ADduction 10  pain durng testing   Left Hip Extension 15  pain during movement   Left Hip Flexion 70  pain during testing   Left Hip ABduction 15  pain during movement   Left Hip ADduction 10  pain during movement   Lumbar Flexion 60  pain with production of symptoms down the R leg   Lumbar Extension 25   Lumbar - Right Side Bend 35   Lumbar - Left Side Bend 30   Lumbar - Right Rotation 30%   Lumbar - Left Rotation 25%   PROM   PROM Assessment Site Hip   Right/Left Hip Right;Left   Left Hip Extension 20  pain at endrange   Left Hip Flexion 93  pain at endrange   Left Hip ABduction 20  pain at endrange   Left Hip ADduction 10  pain at endrange   Strength   Strength Assessment Site Hip;Knee   Right/Left Hip Right;Left   Right Hip Flexion 3+/5  pain during testing   Right Hip Extension 3+/5  pain during testing   Right Hip ABduction 3+/5  pain during testing   Right Hip ADduction 3+/5  pain during testing   Left Hip Flexion 3+/5  pain during testing   Left Hip Extension 3+/5  pain during testing   Left Hip ABduction 3+/5   Left Hip ADduction  3+/5   Right/Left Knee Right;Left   Right Knee Flexion 3+/5   Right Knee Extension 3+/5   Left Knee Flexion 3+/5   Left Knee Extension 3+/5   Right Hip   Right Hip Extension 15  pain at endrange   Right Hip Flexion 45  pain at endrange   Right Hip ABduction 15  pain at endrange   Flexibility  Soft Tissue Assessment /Muscle Length yes   Hamstrings 50 degrees on L and R   R>L   Palpation   Spinal mobility hypomombility of invertebral segements of L1-L5 with pain upon palpation   Palpation comment tendneress and spasm of bil lumbar parapsinals   Special Tests    Special Tests Hip Special Tests;Lumbar   Lumbar Tests Slump Test;Prone Knee Bend Test;Straight Leg Raise   Hip Special Tests  Thomas Test;Hip Scouring   Prone Knee Bend Test   Findings Unable to test   Comment pt couldn't tolerated laying prone   Straight Leg Raise   Findings Positive   Side  Right   Comment well straight leg traise   Thomas Test    Findings Positive   Side Right;Left                           PT Education - 09/01/15 1233    Education provided Yes   Education Details evaluation findings, POC, Goals, HEP, spinal anatmoy   Person(s) Educated Patient   Methods Explanation   Comprehension Verbalized understanding          PT Short Term Goals - 09/01/15 1242    PT SHORT TERM GOAL #1   Title pt will be I with inital HEP (10/02/2015)   Time 4   Period Weeks   Status New   PT SHORT TERM GOAL #2   Title pt wil lincrease her trunk mobilty by > 10 degrees in all planes to assist with ADLs (10/02/2015)   Time 4   Period Weeks   Status New   PT SHORT TERM GOAL #3   Title pt will demonstate improved bil hip mobility by > 10 degrees in flexion with < 5/10 pain during testing to promote functional progression (10/02/2015)   Time 4   Period Weeks   Status New   PT SHORT TERM GOAL #4   Title pt will be able to verbalize and demonstate techniques to reduce low back pain/reinjury  via postual awarness, lifitng and carrying mechanics and HEP (10/02/2015)   Time 4   Period Weeks   Status New           PT Long Term Goals - 09/01/15 1244    PT LONG TERM GOAL #1   Title pt will be I with all HEP given throughout therapy (10/27/2015)   Time 8   Period Weeks   Status New   PT LONG TERM GOAL #2   Title pt will demonstrate functional trunk mobility with < 2/10 pain to help with ADLS (11/16/3233)   Time 8   Period Weeks   Status New   PT LONG TERM GOAL #3   Title pt will increase B LE strenght to > 4/5 with < 2/10 pain to promote walking and standing endurance (10/27/2015)   Time 8   Period Weeks   Status New   PT LONG TERM GOAL #4   Title pt will be able to tolerate standing/ walking for > 30-45 minutes with < 2/10 pain and no shooting pain in the RLE to promote shopping and other ADLS (57/32/2025)   Time 8   Period Weeks   Status New   PT LONG TERM GOAL #5   Title pt will increse her FOTO score to > 60 to demonstrate improved function at discharge (10/27/2015)   Time 8   Period Weeks   Status New  Plan - 09/01/15 1234    Clinical Impression Statement [name] presents to OPPT with CC of low back and bil hip pain that has been present for the last couple of years. She demonstrates limited trunk and hip mobility secondary to pain. MMT revealed weakness of bil LE due to pain in all planes. she exhibits hypomobility of the lumbar intervertebral  movmeents with pain upon palpation and bil lumbar paraspinal spasm with referral of pain down the RLE. special testing was positive for well straight leg raise and slump tesing which in combination with repeated extension bia relieving RLE pain indicating possible discogenic pain/pathology. She would benefit from physical therapy to decrease her pain and improve her function by addressing the impairments listed.    Pt will benefit from skilled therapeutic intervention in order to improve on the following  deficits Abnormal gait;Pain;Improper body mechanics;Postural dysfunction;Hypomobility;Increased muscle spasms;Decreased endurance;Decreased activity tolerance;Decreased strength;Decreased mobility   Rehab Potential Good   PT Frequency 1x / week   PT Duration 8 weeks   PT Treatment/Interventions ADLs/Self Care Home Management;Electrical Stimulation;Iontophoresis 4mg /ml Dexamethasone;Moist Heat;Therapeutic exercise;Therapeutic activities;Ultrasound;Traction;Manual techniques;Taping;Passive range of motion;Dry needling;Patient/family education   PT Next Visit Plan assess response to HEP, trunk mobility, core strengthening, repeated extension exercises (attempt on stomach if tolerated). posture education   PT Home Exercise Plan prone on elbows, pelvic tilts, hamstring stretch, lower trunk rotation   Consulted and Agree with Plan of Care Patient         Problem List Patient Active Problem List   Diagnosis Date Noted  . Fibromyalgia 08/08/2015  . Lumbar facet arthropathy 08/08/2015  . 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 Primary osteoarthritis of both knees 08/25/2014  . Hypokalemia 08/23/2014  . Eczema 06/01/2013  . Routine general medical examination at a health care facility 06/01/2013  . Encounter for screening mammogram for breast cancer 06/01/2013  . Back pain without radiation 08/29/2012  . Constipation 11/15/2011  . ALLERGIC RHINITIS 01/26/2010   Starr Lake PT, DPT, LAT, ATC  09/01/2015  12:52 PM      [facility] [address] [place], [place], [address] Phone: [phone]   Fax:  [phone]  Name: [name] MRN: [id number] Date of Birth: 03-29-1961

## 2015-09-05 ENCOUNTER — Ambulatory Visit: Payer: Commercial Managed Care - HMO | Admitting: Physical Medicine & Rehabilitation

## 2015-09-08 ENCOUNTER — Ambulatory Visit: Payer: Commercial Managed Care - HMO | Admitting: Physical Medicine & Rehabilitation

## 2015-09-08 ENCOUNTER — Encounter: Payer: Commercial Managed Care - HMO | Attending: Physical Medicine & Rehabilitation

## 2015-09-08 DIAGNOSIS — J45909 Unspecified asthma, uncomplicated: Secondary | ICD-10-CM | POA: Insufficient documentation

## 2015-09-08 DIAGNOSIS — D649 Anemia, unspecified: Secondary | ICD-10-CM | POA: Insufficient documentation

## 2015-09-08 DIAGNOSIS — M1288 Other specific arthropathies, not elsewhere classified, other specified site: Secondary | ICD-10-CM | POA: Insufficient documentation

## 2015-09-08 DIAGNOSIS — M7072 Other bursitis of hip, left hip: Secondary | ICD-10-CM | POA: Insufficient documentation

## 2015-09-08 DIAGNOSIS — G894 Chronic pain syndrome: Secondary | ICD-10-CM | POA: Insufficient documentation

## 2015-09-08 DIAGNOSIS — M797 Fibromyalgia: Secondary | ICD-10-CM | POA: Insufficient documentation

## 2015-09-08 DIAGNOSIS — M7071 Other bursitis of hip, right hip: Secondary | ICD-10-CM | POA: Insufficient documentation

## 2015-09-08 DIAGNOSIS — Z8711 Personal history of peptic ulcer disease: Secondary | ICD-10-CM | POA: Insufficient documentation

## 2015-09-08 DIAGNOSIS — M47816 Spondylosis without myelopathy or radiculopathy, lumbar region: Secondary | ICD-10-CM | POA: Insufficient documentation

## 2015-09-08 DIAGNOSIS — Z79899 Other long term (current) drug therapy: Secondary | ICD-10-CM | POA: Insufficient documentation

## 2015-09-08 DIAGNOSIS — Z5181 Encounter for therapeutic drug level monitoring: Secondary | ICD-10-CM | POA: Insufficient documentation

## 2015-09-09 ENCOUNTER — Ambulatory Visit (HOSPITAL_BASED_OUTPATIENT_CLINIC_OR_DEPARTMENT_OTHER): Payer: Commercial Managed Care - HMO | Admitting: Physical Medicine & Rehabilitation

## 2015-09-09 ENCOUNTER — Encounter: Payer: Self-pay | Admitting: Physical Medicine & Rehabilitation

## 2015-09-09 ENCOUNTER — Encounter: Payer: Commercial Managed Care - HMO | Attending: Physical Medicine & Rehabilitation

## 2015-09-09 ENCOUNTER — Ambulatory Visit: Payer: Commercial Managed Care - HMO | Admitting: Physical Therapy

## 2015-09-09 VITALS — BP 124/65 | HR 79 | Resp 14

## 2015-09-09 DIAGNOSIS — Z8711 Personal history of peptic ulcer disease: Secondary | ICD-10-CM | POA: Diagnosis not present

## 2015-09-09 DIAGNOSIS — G894 Chronic pain syndrome: Secondary | ICD-10-CM | POA: Diagnosis not present

## 2015-09-09 DIAGNOSIS — M797 Fibromyalgia: Secondary | ICD-10-CM

## 2015-09-09 DIAGNOSIS — M47816 Spondylosis without myelopathy or radiculopathy, lumbar region: Secondary | ICD-10-CM

## 2015-09-09 DIAGNOSIS — M7071 Other bursitis of hip, right hip: Secondary | ICD-10-CM | POA: Diagnosis not present

## 2015-09-09 DIAGNOSIS — D649 Anemia, unspecified: Secondary | ICD-10-CM | POA: Diagnosis not present

## 2015-09-09 DIAGNOSIS — Z79899 Other long term (current) drug therapy: Secondary | ICD-10-CM | POA: Insufficient documentation

## 2015-09-09 DIAGNOSIS — M7072 Other bursitis of hip, left hip: Secondary | ICD-10-CM | POA: Insufficient documentation

## 2015-09-09 DIAGNOSIS — Z5181 Encounter for therapeutic drug level monitoring: Secondary | ICD-10-CM | POA: Insufficient documentation

## 2015-09-09 DIAGNOSIS — M1288 Other specific arthropathies, not elsewhere classified, other specified site: Secondary | ICD-10-CM | POA: Insufficient documentation

## 2015-09-09 DIAGNOSIS — M7061 Trochanteric bursitis, right hip: Secondary | ICD-10-CM | POA: Diagnosis not present

## 2015-09-09 DIAGNOSIS — J45909 Unspecified asthma, uncomplicated: Secondary | ICD-10-CM | POA: Diagnosis not present

## 2015-09-09 NOTE — Progress Notes (Signed)
Subjective:    Patient ID: [name], female    DOB: 11/24/1960, 54 y.o.   MRN: [id number]  HPI Chief complaint is low back as well as right thigh pain Pain is worsened with standing and improved with sitting Patient states that her hip pain is worsened with laying on the right side and improves with laying on her back however due to her breathing problems she cannot lay on her back for long period time.  Patient did attend physical therapy, back was sore after that. Has another session scheduled.   Pain Inventory Average Pain 8 Pain Right Now 8 My pain is sharp, stabbing and aching  In the last 24 hours, has pain interfered with the following? General activity 6 Relation with others 4 Enjoyment of life 7 What TIME of day is your pain at its worst? morning, evening  Sleep (in general) Fair  Pain is worse with: walking, bending and standing Pain improves with: therapy/exercise and pacing activities Relief from Meds: 5  Mobility walk without assistance how many minutes can you walk? 10 do you drive?  no Do you have any goals in this area?  no  Function not employed: date last employed 07/25/15 I need assistance with the following:  household duties  Neuro/Psych bowel control problems trouble walking spasms dizziness  Prior Studies Any changes since last visit?  no  Physicians involved in your care Any changes since last visit?  no   Family History  Problem Relation Age of Onset  . Arthritis Other   . Diabetes Other   . Hypertension Other   . Hypertension Mother   . Diabetes Sister   . Cancer Neg Hx   . Early death Neg Hx   . Hearing loss Neg Hx   . Heart disease Neg Hx   . Hyperlipidemia Neg Hx   . Kidney disease Neg Hx   . Stroke Neg Hx   . Hypertension Sister    Social History   Social History  . Marital Status: Married    Spouse Name: N/A  . Number of Children: N/A  . Years of Education: N/A   Social History Main Topics  . Smoking  status: Never Smoker   . Smokeless tobacco: Never Used  . Alcohol Use: No  . Drug Use: No  . Sexual Activity: Yes    Birth Control/ Protection: Post-menopausal, Surgical   Other Topics Concern  . None   Social History Narrative   Past Surgical History  Procedure Laterality Date  . Tubal ligation     Past Medical History  Diagnosis Date  . Allergy     rhinitis  . Asthma   . Headache(784.0)   . Anemia   . History of stomach ulcers    BP 124/65 mmHg  Pulse 79  Resp 14  SpO2 99%  LMP 05/17/2012  Opioid Risk Score:   Fall Risk Score:  `1  Depression screen PHQ 2/9  Depression screen [facility] 2/9 08/08/2015 08/25/2014  Decreased Interest 3 0  Down, Depressed, Hopeless 0 0  PHQ - 2 Score 3 0  Altered sleeping 1 -  Tired, decreased energy 1 -  Change in appetite 0 -  Feeling bad or failure about yourself  0 -  Trouble concentrating 0 -  Moving slowly or fidgety/restless 3 -  Suicidal thoughts 0 -  PHQ-9 Score 8 -  Difficult doing work/chores Very difficult -     Review of Systems  Gastrointestinal: Positive for nausea, vomiting,  abdominal pain and constipation.  Musculoskeletal: Positive for gait problem.  Skin: Positive for rash.  Neurological: Positive for dizziness.       Spasms  All other systems reviewed and are negative.      Objective:   Physical Exam  Constitutional: She is oriented to person, place, and time. She appears well-developed and well-nourished.  Musculoskeletal:       Right hip: She exhibits bony tenderness. She exhibits normal range of motion and no deformity.       Left hip: She exhibits no tenderness.       Cervical back: She exhibits tenderness.       Thoracic back: She exhibits tenderness.       Lumbar back: She exhibits tenderness.  Diffuse tenderness in the cervical paraspinal, thoracic paraspinal and lumbar spine paraspinal area as well as periscapular area  Neurological: She is alert and oriented to person, place, and time.    Psychiatric: She has a normal mood and affect.  Nursing note and vitals reviewed.  Limited lumbar range of motion with flexion extension lateral bending and rotation. Extension seems to be more tight than flexion Negative straight leg raising        Assessment & Plan:  1. Myofascial pain syndrome versus fibromyalgia syndrome. I think she would benefit in either case from gabapentin 300 mg 3 times a day. She has not had this filled yet. We may need to work up on the dosage if this current dose is not effective.  I recommended Lyrica initially but she could not afford this.  2. Chronic low back pain she does have some evidence of both lumbar degenerative disc as well as lumbar spondylosis. Agree with continued physical therapy treatments. Encouraged patient to get over the hump as her pain may increase before it decreases.  3. Right trochanteric bursitis this has become more symptomatic. This is interfering with sleep. We'll do a trochanteric bursa injection Trochanteric bursa injection With or without ultrasound guidance  Indication Trochanteric bursitis. Exam has tenderness over the greater trochanter of the hip. Pain has not responded to conservative care such as exercise therapy and oral medications. Pain interferes with sleep or with mobility Informed consent was obtained after describing risks and benefits of the procedure with the patient these include bleeding bruising and infection. Patient has signed written consent form. Patient placed in a lateral decubitus position with the affected hip superior. Point of maximal pain was palpated marked and prepped with Betadine and entered with a needle to bone contact. Needle slightly withdrawn then 6mg  of betamethasone with 4 cc 1% lidocaine were injected. Patient tolerated procedure well. Post procedure instructions given.

## 2015-09-09 NOTE — Patient Instructions (Signed)
Hip Bursitis Bursitis is a swelling and soreness (inflammation) of a fluid-filled sac (bursa). This sac overlies and protects the joints.  CAUSES   Injury.  Overuse of the muscles surrounding the joint.  Arthritis.  Gout.  Infection.  Cold weather.  Inadequate warm-up and conditioning prior to activities. The cause may not be known.  SYMPTOMS   Mild to severe irritation.  Tenderness and swelling over the outside of the hip.  Pain with motion of the hip.  If the bursa becomes infected, a fever may be present. Redness, tenderness, and warmth will develop over the hip. Symptoms usually lessen in 3 to 4 weeks with treatment, but can come back. TREATMENT If conservative treatment does not work, your caregiver may advise draining the bursa and injecting cortisone into the area. This may speed up the healing process. This may also be used as an initial treatment of choice. HOME CARE INSTRUCTIONS   Apply ice to the affected area for 15-20 minutes every 3 to 4 hours while awake for the first 2 days. Put the ice in a plastic bag and place a towel between the bag of ice and your skin.  Rest the painful joint as much as possible, but continue to put the joint through a normal range of motion at least 4 times per day. When the pain lessens, begin normal, slow movements and usual activities to help prevent stiffness of the hip.  Only take over-the-counter or prescription medicines for pain, discomfort, or fever as directed by your caregiver.  Use crutches to limit weight bearing on the hip joint, if advised.  Elevate your painful hip to reduce swelling. Use pillows for propping and cushioning your legs and hips.  Gentle massage may provide comfort and decrease swelling. SEEK IMMEDIATE MEDICAL CARE IF:   Your pain increases even during treatment, or you are not improving.  You have a fever.  You have heat and inflammation over the involved bursa.  You have any other questions or  concerns. MAKE SURE YOU:   Understand these instructions.  Will watch your condition.  Will get help right away if you are not doing well or get worse.   This information is not intended to replace advice given to you by your health care provider. Make sure you discuss any questions you have with your health care provider.   Document Released: 04/16/2002 Document Revised: 01/17/2012 Document Reviewed: 05/27/2015 Elsevier Interactive Patient Education 2016 Elsevier Inc.  

## 2015-09-11 ENCOUNTER — Telehealth: Payer: Self-pay | Admitting: *Deleted

## 2015-09-11 MED ORDER — GABAPENTIN 100 MG PO CAPS: 100.0000 mg | ORAL_CAPSULE | Freq: Three times a day (TID) | ORAL | Status: DC

## 2015-09-11 NOTE — Telephone Encounter (Signed)
Pt had injection on 09/09/2015.  She also rec'd a new script for gabapentin 300 mg TID.  She complains of dizziness and being liight headed. She i asking if the injection or medication has anything to do with the way she is feeling

## 2015-09-11 NOTE — Telephone Encounter (Signed)
Injection should not be causing this symptom. It may be the gabapentin. Please call in gabapentin 100 mg 3 times a day #90 no refill. Try this in place of the 300 mg

## 2015-09-11 NOTE — Telephone Encounter (Signed)
Medication sent into pharmacy. Thanks!  

## 2015-09-11 NOTE — Telephone Encounter (Signed)
I spoke with [name] and she is still feeling light headed and dizzy. I explained the medication change. I had her repeat back to me the change from 300 mg to the new prescription 100 mg. She repeated it back to me successfully. I told her we will call the new medication to her pharmacy. She stated her pharmacy was [facility] in [place].

## 2015-09-15 ENCOUNTER — Telehealth: Payer: Self-pay | Admitting: Physical Medicine & Rehabilitation

## 2015-09-15 NOTE — Telephone Encounter (Signed)
Please schedule patient to come in to see [name] tomorrow. Please and thank you

## 2015-09-15 NOTE — Telephone Encounter (Signed)
PT WAS SCHEDULED FOR Friday WITH [place] - DID NOT HAVE TRANSPORTATION FOR TOMORROW

## 2015-09-15 NOTE — Telephone Encounter (Signed)
Patient had an injection and it is not working, would like to know what she can do.  Please call her.

## 2015-09-16 ENCOUNTER — Ambulatory Visit: Payer: Commercial Managed Care - HMO | Attending: Physical Medicine & Rehabilitation | Admitting: Physical Therapy

## 2015-09-16 DIAGNOSIS — M5386 Other specified dorsopathies, lumbar region: Secondary | ICD-10-CM

## 2015-09-16 DIAGNOSIS — M24659 Ankylosis, unspecified hip: Secondary | ICD-10-CM | POA: Diagnosis present

## 2015-09-16 DIAGNOSIS — M62830 Muscle spasm of back: Secondary | ICD-10-CM | POA: Diagnosis present

## 2015-09-16 DIAGNOSIS — R293 Abnormal posture: Secondary | ICD-10-CM | POA: Diagnosis present

## 2015-09-16 DIAGNOSIS — M545 Low back pain: Secondary | ICD-10-CM | POA: Diagnosis not present

## 2015-09-16 DIAGNOSIS — R29898 Other symptoms and signs involving the musculoskeletal system: Secondary | ICD-10-CM

## 2015-09-16 DIAGNOSIS — M2560 Stiffness of unspecified joint, not elsewhere classified: Secondary | ICD-10-CM | POA: Diagnosis present

## 2015-09-16 DIAGNOSIS — M25659 Stiffness of unspecified hip, not elsewhere classified: Secondary | ICD-10-CM

## 2015-09-16 NOTE — Therapy (Signed)
[name], [place], [address] Phone: [phone]   Fax:  [phone]  Physical Therapy Treatment  Patient Details  Name: [name] MRN: [id number] Date of Birth: November 05, 1961 Referring Provider: Dr. Lynford Citizen  Encounter Date: 09/16/2015      PT End of Session - 09/16/15 0954    Visit Number 2   Number of Visits 8   Date for PT Re-Evaluation 10/27/15   PT Start Time 0939  pt was 9 minutes late today   PT Stop Time 1025   PT Time Calculation (min) 46 min   Activity Tolerance Patient tolerated treatment well   Behavior During Therapy [facility] for tasks assessed/performed      Past Medical History  Diagnosis Date  . Allergy     rhinitis  . Asthma   . Headache(784.0)   . Anemia   . History of stomach ulcers     Past Surgical History  Procedure Laterality Date  . Tubal ligation      There were no vitals filed for this visit.  Visit Diagnosis:  Bilateral low back pain, with sciatica presence unspecified  Decreased ROM of lumbar spine  Weakness of both lower extremities  Muscle spasm of back  Abnormal posture  Decreased range of hip movement, unspecified laterality      Subjective Assessment - 09/16/15 0945    Subjective "I am not doing too well, I saw my physician last week and got an injection the R hip but it isn't doing too well" She reports that doctor put her on medication that caused her to fall and fell twice, she called and the doctor and he adjusted the dosage.    Currently in Pain? Yes   Pain Score 8   last took pain medication at 8am   Pain Location Back   Pain Orientation Right;Left;Lower   Pain Type Chronic pain   Pain Onset More than a month ago   Pain Frequency Constant   Aggravating Factors  walking, standing   Pain Relieving Factors pain medication, ice   Pain Score 5   Pain Location Hip   Pain Orientation Right;Left   Pain Descriptors / Indicators  Aching;Throbbing;Sore   Pain Type Chronic pain   Pain Onset More than a month ago   Pain Frequency Constant   Aggravating Factors  walking/ standing   Pain Relieving Factors pain medication and ice                         [facility] Adult PT Treatment/Exercise - 09/16/15 0949    Self-Care   Self-Care Posture;Other Self-Care Comments   Posture correct posture during every day tasks with handout provided.    Other Self-Care Comments  reviewed HEP and educated on importance of stretching and doing exercises and performing them correctly   Lumbar Exercises: Stretches   Active Hamstring Stretch 2 reps   Lumbar Exercises: Aerobic   Stationary Bike NuStep L 5 x 5 min   Lumbar Exercises: Supine   Bent Knee Raise 20 reps  with abdominal draw in manuever                PT Education - 09/16/15 1304    Education provided Yes   Education Details posture education, and HEP review   Person(s) Educated Patient   Methods Explanation   Comprehension Verbalized understanding          PT Short Term Goals - 09/16/15  Bude #1   Title pt will be I with inital HEP (10/02/2015)   Time 4   Period Weeks   Status On-going   PT SHORT TERM GOAL #2   Title pt wil lincrease her trunk mobilty by > 10 degrees in all planes to assist with ADLs (10/02/2015)   Time 4   Period Weeks   Status On-going   PT SHORT TERM GOAL #3   Title pt will demonstate improved bil hip mobility by > 10 degrees in flexion with < 5/10 pain during testing to promote functional progression (10/02/2015)   Time 4   Period Weeks   Status On-going   PT SHORT TERM GOAL #4   Title pt will be able to verbalize and demonstate techniques to reduce low back pain/reinjury via postual awarness, lifitng and carrying mechanics and HEP (10/02/2015)   Time 4   Period Weeks   Status On-going           PT Long Term Goals - 09/16/15 1306    PT LONG TERM GOAL #1   Title pt will be I with all  HEP given throughout therapy (10/27/2015)   Time 8   Period Weeks   Status On-going   PT LONG TERM GOAL #2   Title pt will demonstrate functional trunk mobility with < 2/10 pain to help with ADLS (84/13/2440)   Time 8   Period Weeks   Status On-going   PT LONG TERM GOAL #3   Title pt will increase B LE strenght to > 4/5 with < 2/10 pain to promote walking and standing endurance (10/27/2015)   Time 8   Period Weeks   Status On-going   PT LONG TERM GOAL #4   Title pt will be able to tolerate standing/ walking for > 30-45 minutes with < 2/10 pain and no shooting pain in the RLE to promote shopping and other ADLS (09/04/2535)   Time 8   Period Weeks   Status On-going   PT LONG TERM GOAL #5   Title pt will increse her FOTO score to > 60 to demonstrate improved function at discharge (10/27/2015)   Time 8   Period Weeks   Status On-going               Plan - 09/16/15 1304    Clinical Impression Statement Derriana reports she hasn't been as consistent with her HEP fully. Reviewed HEP and educated importance of doing all exercises. Educated about posture and provided handout for reference. She was able to perform all exercises without complaint but opted for heat and e-stim to help decrase her low back pain.    PT Next Visit Plan trunk mobility, core strengthening, repeated extension exercises (attempt on stomach if tolerated).    PT Home Exercise Plan HEP review, and posture education.    Consulted and Agree with Plan of Care Patient        Problem List Patient Active Problem List   Diagnosis Date Noted  . Fibromyalgia 08/08/2015  . Lumbar facet arthropathy 08/08/2015  . 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 Primary osteoarthritis of both knees 08/25/2014  . Hypokalemia 08/23/2014  . Eczema 06/01/2013  . Routine general medical examination at a health care facility 06/01/2013  . Encounter for screening mammogram for breast cancer 06/01/2013  . Back pain without radiation  08/29/2012  . Constipation 11/15/2011  . ALLERGIC RHINITIS 01/26/2010   Starr Lake PT, DPT, LAT, ATC  09/16/2015  [facility] [place], [place], [address] Phone: [phone]   Fax:  [phone]  Name: [name] MRN: [id number] Date of Birth: 08/23/1961

## 2015-09-16 NOTE — Patient Instructions (Signed)

## 2015-09-17 ENCOUNTER — Telehealth: Payer: Self-pay | Admitting: Internal Medicine

## 2015-09-17 DIAGNOSIS — M170 Bilateral primary osteoarthritis of knee: Secondary | ICD-10-CM

## 2015-09-17 DIAGNOSIS — M5126 Other intervertebral disc displacement, lumbar region: Secondary | ICD-10-CM

## 2015-09-17 NOTE — Telephone Encounter (Signed)
Pt request refill for traMADol (ULTRAM) 50 MG tablet  To be send in to [facility]. Please help, she is out of this med.

## 2015-09-18 ENCOUNTER — Telehealth: Payer: Self-pay | Admitting: Internal Medicine

## 2015-09-18 NOTE — Telephone Encounter (Signed)
Pt called requesting refill for traMADol (ULTRAM) 50 MG tablet [id number] I noticed there are 2 refills on the prescription. She states there are no refills on her bottle. [facility]

## 2015-09-18 NOTE — Telephone Encounter (Signed)
Pt states you told her husband [name] that you would refill her prescription.  Pt is aware you are out till Monday and will address it then.

## 2015-09-18 NOTE — Telephone Encounter (Signed)
Can we get this pt on the schedule

## 2015-09-18 NOTE — Telephone Encounter (Signed)
Last OV 12/11/14. Pt reequesting RF

## 2015-09-18 NOTE — Telephone Encounter (Signed)
Pt informed

## 2015-09-18 NOTE — Telephone Encounter (Signed)
See prior phone note. 

## 2015-09-19 ENCOUNTER — Encounter: Payer: Self-pay | Admitting: Physical Medicine & Rehabilitation

## 2015-09-19 ENCOUNTER — Ambulatory Visit (HOSPITAL_BASED_OUTPATIENT_CLINIC_OR_DEPARTMENT_OTHER): Payer: Commercial Managed Care - HMO | Admitting: Physical Medicine & Rehabilitation

## 2015-09-19 ENCOUNTER — Other Ambulatory Visit: Payer: Self-pay

## 2015-09-19 VITALS — BP 160/106 | HR 84

## 2015-09-19 DIAGNOSIS — M797 Fibromyalgia: Secondary | ICD-10-CM

## 2015-09-19 DIAGNOSIS — M170 Bilateral primary osteoarthritis of knee: Secondary | ICD-10-CM

## 2015-09-19 DIAGNOSIS — G894 Chronic pain syndrome: Secondary | ICD-10-CM | POA: Diagnosis not present

## 2015-09-19 DIAGNOSIS — M5126 Other intervertebral disc displacement, lumbar region: Secondary | ICD-10-CM

## 2015-09-19 MED ORDER — NORTRIPTYLINE HCL 10 MG PO CAPS: 10.0000 mg | ORAL_CAPSULE | Freq: Every day | ORAL | Status: DC

## 2015-09-19 MED ORDER — TRAMADOL HCL 50 MG PO TABS: 50.0000 mg | ORAL_TABLET | Freq: Four times a day (QID) | ORAL | Status: DC | PRN

## 2015-09-19 NOTE — Progress Notes (Signed)
Subjective:    Patient ID: [name], female    DOB: [birth year], 54 y.o.   MRN: [id number]  HPI 54 year old female with history of chronic widespread body pain as well as low back pain. She has been treated with tramadol but ran out about a week ago. She states that she did not receive a refill although Epic indicates that she did receive 2 refills. I looked at the bottle with a fill date of 08/19/2015 which showed no refills. She is also wanting to switch her pharmacy from [facility] in [place] to [facility] in [place]  She has been attending physical therapy. She told her therapist that the trochanteric bursa injection performed on the right side was not helpful last visit. She's been emotionally labile. She states that the gabapentin made her feel funny emotional, ears ringing, Trembly. Dose was reduced from 300 mg 100 mg but some of these symptoms have persisted.  Patient has sleep problems some of this is pain related.   Pain Inventory Average Pain 10 Pain Right Now 10 My pain is aching  In the last 24 hours, has pain interfered with the following? General activity 5 Relation with others 5 Enjoyment of life NA What TIME of day is your pain at its worst? NA Sleep (in general) NA  Pain is worse with: NA Pain improves with: NA Relief from Meds: NA  Mobility walk with assistance how many minutes can you walk? 15 ability to climb steps?  yes do you drive?  no Do you have any goals in this area?  yes  Function not employed: date last employed NA  Neuro/Psych bowel control problems trouble walking spasms dizziness  Prior Studies Any changes since last visit?  no  Physicians involved in your care Any changes since last visit?  no   Family History  Problem Relation Age of Onset  . Arthritis Other   . Diabetes Other   . Hypertension Other   . Hypertension Mother   . Diabetes Sister   . Cancer Neg Hx   . Early death Neg Hx   . Hearing loss Neg Hx   .  Heart disease Neg Hx   . Hyperlipidemia Neg Hx   . Kidney disease Neg Hx   . Stroke Neg Hx   . Hypertension Sister    Social History   Social History  . Marital Status: Married    Spouse Name: N/A  . Number of Children: N/A  . Years of Education: N/A   Social History Main Topics  . Smoking status: Never Smoker   . Smokeless tobacco: Never Used  . Alcohol Use: No  . Drug Use: No  . Sexual Activity: Yes    Birth Control/ Protection: Post-menopausal, Surgical   Other Topics Concern  . None   Social History Narrative   Past Surgical History  Procedure Laterality Date  . Tubal ligation     Past Medical History  Diagnosis Date  . Allergy     rhinitis  . Asthma   . Headache(784.0)   . Anemia   . History of stomach ulcers    BP 160/106 mmHg  Pulse 84  SpO2 96%  LMP 05/17/2012  Opioid Risk Score:   Fall Risk Score:  `1  Depression screen PHQ 2/9  Depression screen [facility] 2/9 08/08/2015 08/25/2014  Decreased Interest 3 0  Down, Depressed, Hopeless 0 0  PHQ - 2 Score 3 0  Altered sleeping 1 -  Tired, decreased energy 1 -  Change in appetite 0 -  Feeling bad or failure about yourself  0 -  Trouble concentrating 0 -  Moving slowly or fidgety/restless 3 -  Suicidal thoughts 0 -  PHQ-9 Score 8 -  Difficult doing work/chores Very difficult -       Review of Systems  Constitutional: Positive for diaphoresis.  Respiratory: Positive for cough.   Gastrointestinal: Positive for nausea and constipation.       Bowel Control Problems  Musculoskeletal:       Spasms  Skin: Positive for rash.  Neurological: Positive for dizziness.       Gait instability  All other systems reviewed and are negative.      Objective:   Physical Exam Obese female in no acute distress Lumbar spine 75% flexion extension lateral bending and rotation without pain. Motor strength is 5/5 bilateral upper and lower limbs Tenderness to palpation in 14 fibromyalgia tender points. Tenderness  over bilateral hips. Mood and affect emotionally labile and cries easily Last pH Q9 and September 2016 was 8      Assessment & Plan:  1. Myofascial pain syndrome versus fibromyalgia syndrome. Patient feels she had no benefit from gabapentin at 300 mg. She complained of some side effects including feeling trembly, ears ringing, emotional. I cannot say that all the symptoms were related to her gabapentin. In addition she ran out of tramadol about a week ago and may have had some mild withdrawal type symptoms.. She has not had this filled yet. We may need to work up on the dosage if this current dose is not effective.  I recommended Lyrica initially but she could not afford this.Will discontinue gabapentin Continue tramadol 50 mg 3 times a day 100 tablets per month Add nortriptyline 10 oh grams daily at bedtime may need to increase to 25 mg next month  2. Chronic low back pain she does have some evidence of both lumbar degenerative disc as well as lumbar spondylosis. Agree with continued physical therapy treatments. Encouraged patient to get over the hump as her pain may increase before it decreases.Some of her back pain is also fibromyalgia related as spine range of motion does not seem to exacerbate her pain.No need for lumbar spine injections.  3. Right hip pain  trochanteric bursitis versus fibromyalgia tender point. Given the fact that she did not respond well to trochanteric bursa injection I favor fibromyalgia tender point as etiology of pain. Positioning, range of motion per PT advised.

## 2015-09-19 NOTE — Patient Instructions (Signed)
Take the nortriptyline only at night. It should help with sleep. If your sleep does not improve in about a week please call us for more instructions.

## 2015-09-19 NOTE — Telephone Encounter (Signed)
Medication sent in to correct pharmacy. [facility]

## 2015-09-20 NOTE — Telephone Encounter (Signed)
She has to be seen

## 2015-09-22 NOTE — Telephone Encounter (Signed)
Pt informed. She will call back to schedule appt

## 2015-09-23 ENCOUNTER — Ambulatory Visit: Payer: Commercial Managed Care - HMO | Admitting: Physical Therapy

## 2015-09-30 ENCOUNTER — Ambulatory Visit: Payer: Commercial Managed Care - HMO | Admitting: Physical Therapy

## 2015-10-14 ENCOUNTER — Telehealth: Payer: Self-pay | Admitting: Internal Medicine

## 2015-10-14 NOTE — Telephone Encounter (Signed)
Pt called in and stated that she fell or something in her sleep last night and has a knot on her head.  She wanted me to ask Dr Ronnald Ramp if he thought that she needed to be seen?  I told her she should probably make an appt but she wanted to see what dr Ronnald Ramp would say.

## 2015-10-15 NOTE — Telephone Encounter (Signed)
Called and left patient a message to give [name] a call back. Dr. Ronnald Ramp said that unless she is having headaches, vomiting, or having dizzy spells she should be ok if it is just a knot on her head. If she still wants to make an appointment it is up to her discretion.

## 2015-10-17 ENCOUNTER — Ambulatory Visit: Payer: Commercial Managed Care - HMO | Attending: Physical Medicine & Rehabilitation | Admitting: Physical Therapy

## 2015-10-17 DIAGNOSIS — M545 Low back pain: Secondary | ICD-10-CM | POA: Diagnosis present

## 2015-10-17 DIAGNOSIS — R293 Abnormal posture: Secondary | ICD-10-CM | POA: Diagnosis present

## 2015-10-17 DIAGNOSIS — M24659 Ankylosis, unspecified hip: Secondary | ICD-10-CM | POA: Diagnosis present

## 2015-10-17 DIAGNOSIS — M2560 Stiffness of unspecified joint, not elsewhere classified: Secondary | ICD-10-CM | POA: Diagnosis present

## 2015-10-17 DIAGNOSIS — M5386 Other specified dorsopathies, lumbar region: Secondary | ICD-10-CM

## 2015-10-17 DIAGNOSIS — M25659 Stiffness of unspecified hip, not elsewhere classified: Secondary | ICD-10-CM

## 2015-10-17 NOTE — Therapy (Addendum)
[place], [place], [address] Phone: [phone]   Fax:  [phone]  Physical Therapy Treatment  Patient Details  Name: [name] MRN: [id number] Date of Birth: June 06, 1961 Referring Provider: Dr. Lynford Citizen  Encounter Date: 10/17/2015      PT End of Session - 10/17/15 1153    Visit Number 3   Number of Visits 8   Date for PT Re-Evaluation 10/27/15   Activity Tolerance Patient tolerated treatment well;No increased pain   Behavior During Therapy [facility] for tasks assessed/performed      Past Medical History  Diagnosis Date  . Allergy     rhinitis  . Asthma   . Headache(784.0)   . Anemia   . History of stomach ulcers     Past Surgical History  Procedure Laterality Date  . Tubal ligation      There were no vitals filed for this visit.  Visit Diagnosis:  Bilateral low back pain, with sciatica presence unspecified - Plan: PT plan of care cert/re-cert  Decreased ROM of lumbar spine - Plan: PT plan of care cert/re-cert  Decreased range of hip movement, unspecified laterality - Plan: PT plan of care cert/re-cert  Abnormal posture - Plan: PT plan of care cert/re-cert      Subjective Assessment - 10/17/15 1145    Subjective Still having problems with the legs and hips hurt real bad. It starts hurting too if I'm standing for a long time too. Had a fall earlier this week and fell down and hit her head. Called the doctor about it and is going in to see the doctor on 10/20/15. Fell down because both legs just gave out after getting out of bed. States that she has been doing her HEP about 3 times per week.    How long can you stand comfortably? 15 minutes   How long can you walk comfortably? 1 hour   Patient Stated Goals to return back to work, and decrease the pain                         [facility] Adult PT Treatment/Exercise - 10/17/15 0001    Lumbar Exercises: Stretches   Active Hamstring Stretch 3 reps;30 seconds   Active Hamstring Stretch Limitations attempted supine straight leg/ bent knee active/seated. Patient only able to perform seated stretch. Verbal and tactile cues needed with each stretch   Lower Trunk Rotation 5 reps;10 seconds   Lower Trunk Rotation Limitations verbal and tactile cues needed for technique   Press Ups Limitations patient states she has not been working on this at home   Lumbar Exercises: Supine   Ab Set 10 reps;5 seconds   Bent Knee Raise 10 reps;2 seconds   Bent Knee Raise Limitations heavy verbal cues needed                PT Education - 10/17/15 1151    Education provided Yes   Education Details review of HEP   Person(s) Educated Patient   Methods Explanation;Demonstration;Tactile cues;Verbal cues;Handout   Comprehension Verbalized understanding;Returned demonstration          PT Short Term Goals - 10/17/15 1202    PT SHORT TERM GOAL #1   Title pt will be I with inital HEP (10/02/2015)   Time 4   Period Weeks   Status On-going   PT SHORT TERM GOAL #2   Title pt wil lincrease her trunk mobilty by >  10 degrees in all planes to assist with ADLs (10/02/2015)   Time 4   Period Weeks   Status On-going   PT SHORT TERM GOAL #3   Title pt will demonstate improved bil hip mobility by > 10 degrees in flexion with < 5/10 pain during testing to promote functional progression (10/02/2015)   Time 4   Period Weeks   Status On-going   PT SHORT TERM GOAL #4   Title pt will be able to verbalize and demonstate techniques to reduce low back pain/reinjury via postual awarness, lifitng and carrying mechanics and HEP (10/02/2015)   Period Weeks   Status On-going           PT Long Term Goals - 10/17/15 1215    PT LONG TERM GOAL #1   Title pt will be I with all HEP given throughout therapy (10/27/2015)   Time 4   Period Weeks   Status On-going   PT LONG TERM GOAL #2   Title pt will demonstrate functional trunk  mobility with < 2/10 pain to help with ADLS (32/99/2426)   Time 4   Period Weeks   Status On-going   PT LONG TERM GOAL #3   Title pt will increase B LE strenght to > 4/5 with < 2/10 pain to promote walking and standing endurance (10/27/2015)   Time 4   Period Weeks   Status On-going   PT LONG TERM GOAL #4   Title pt will be able to tolerate standing/ walking for > 30-45 minutes with < 2/10 pain and no shooting pain in the RLE to promote shopping and other ADLS (83/41/9622)   Period Weeks   Status On-going   PT LONG TERM GOAL #5   Title pt will increse her FOTO score to > 60 to demonstrate improved function at discharge (10/27/2015)   Time 4   Period Weeks   Status On-going               Plan - 10/17/15 1153    Clinical Impression Statement Patient coming to PT session with last visit having been on 09/16/15. When asked why she had not returned for previous sessions she responded that she was not sure. Patient reporting that she has been working on her HEP 3Xweek but unable to recall any of her exercises. These were reviewed and modified during the session. At this time the patient reports that she does not want to schedule any further appointments untl she has her follow-up on Monday with her physician.    Pt will benefit from skilled therapeutic intervention in order to improve on the following deficits Abnormal gait;Pain;Improper body mechanics;Postural dysfunction;Hypomobility;Increased muscle spasms;Decreased endurance;Decreased activity tolerance;Decreased strength;Decreased mobility   PT Frequency 1x / week   PT Duration 4 weeks   PT Treatment/Interventions ADLs/Self Care Home Management;Electrical Stimulation;Iontophoresis 68m/ml Dexamethasone;Moist Heat;Therapeutic exercise;Therapeutic activities;Ultrasound;Traction;Manual techniques;Taping;Passive range of motion;Dry needling;Patient/family education   PT Next Visit Plan Review and progress HEP with flexibility and core  strengthening. Focus on active treatment.    PT Home Exercise Plan HEP review, and posture education.    Consulted and Agree with Plan of Care Patient        Problem List Patient Active Problem List   Diagnosis Date Noted  . Fibromyalgia 08/08/2015  . Lumbar facet arthropathy 08/08/2015  . 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 Primary osteoarthritis of both knees 08/25/2014  . Hypokalemia 08/23/2014  . Eczema 06/01/2013  . Routine general medical examination at a health care facility  06/01/2013  . Encounter for screening mammogram for breast cancer 06/01/2013  . Back pain without radiation 08/29/2012  . Constipation 11/15/2011  . ALLERGIC RHINITIS 01/26/2010    Linard Millers, PT 10/17/2015, 12:18 PM  [facility] [address] [place], [place], [address] Phone: [phone]   Fax:  [phone]  Name: TASHARA SUDER MRN: [id number] Date of Birth: 20-Apr-1961    PHYSICAL THERAPY DISCHARGE SUMMARY  Visits from Start of Care: 3  Current functional level related to goals / functional outcomes: See goals   Remaining deficits: Unknown due to pt not returning.   Education / Equipment: HEP, theraband  Plan:                                                    Patient goals were not met. Patient is being discharged due to not returning since the last visit.  ?????        Kristoffer Leamon PT, DPT, LAT, ATC  12/22/2015  10:03 AM       Kristoffer Leamon PT, DPT, LAT, ATC  12/22/2015  10:03 AM

## 2015-10-20 ENCOUNTER — Encounter: Payer: Self-pay | Admitting: Registered Nurse

## 2015-10-20 ENCOUNTER — Encounter: Payer: Commercial Managed Care - HMO | Attending: Physical Medicine & Rehabilitation | Admitting: Registered Nurse

## 2015-10-20 VITALS — BP 128/73 | HR 79 | Resp 14

## 2015-10-20 DIAGNOSIS — M1288 Other specific arthropathies, not elsewhere classified, other specified site: Secondary | ICD-10-CM | POA: Insufficient documentation

## 2015-10-20 DIAGNOSIS — M47816 Spondylosis without myelopathy or radiculopathy, lumbar region: Secondary | ICD-10-CM | POA: Diagnosis not present

## 2015-10-20 DIAGNOSIS — G4700 Insomnia, unspecified: Secondary | ICD-10-CM

## 2015-10-20 DIAGNOSIS — M797 Fibromyalgia: Secondary | ICD-10-CM | POA: Insufficient documentation

## 2015-10-20 DIAGNOSIS — M7918 Myalgia, other site: Secondary | ICD-10-CM

## 2015-10-20 DIAGNOSIS — D649 Anemia, unspecified: Secondary | ICD-10-CM | POA: Diagnosis not present

## 2015-10-20 DIAGNOSIS — J45909 Unspecified asthma, uncomplicated: Secondary | ICD-10-CM | POA: Insufficient documentation

## 2015-10-20 DIAGNOSIS — M170 Bilateral primary osteoarthritis of knee: Secondary | ICD-10-CM

## 2015-10-20 DIAGNOSIS — M7071 Other bursitis of hip, right hip: Secondary | ICD-10-CM | POA: Diagnosis not present

## 2015-10-20 DIAGNOSIS — M791 Myalgia: Secondary | ICD-10-CM

## 2015-10-20 DIAGNOSIS — Z8711 Personal history of peptic ulcer disease: Secondary | ICD-10-CM | POA: Insufficient documentation

## 2015-10-20 DIAGNOSIS — G894 Chronic pain syndrome: Secondary | ICD-10-CM | POA: Diagnosis not present

## 2015-10-20 DIAGNOSIS — Z79899 Other long term (current) drug therapy: Secondary | ICD-10-CM

## 2015-10-20 DIAGNOSIS — Z5181 Encounter for therapeutic drug level monitoring: Secondary | ICD-10-CM | POA: Diagnosis not present

## 2015-10-20 DIAGNOSIS — M7072 Other bursitis of hip, left hip: Secondary | ICD-10-CM | POA: Diagnosis not present

## 2015-10-20 DIAGNOSIS — M5126 Other intervertebral disc displacement, lumbar region: Secondary | ICD-10-CM

## 2015-10-20 NOTE — Progress Notes (Signed)
Subjective:    Patient ID: [name], female    DOB: 08-24-1961, 54 y.o.   MRN: [id number]  HPI: Ms. Julicia Seo is a 54 year old female who returns for follow up appointment and medication refill. She says her pain is located in her lower back and right lower extremity. She rates her pain 6. Her current exercise regime is attending physical therapy weekly, performing stretching exercises and walking short distances. Also states her insomnia improved with the Pamelor.  Pain Inventory Average Pain 6 Pain Right Now 6 My pain is stabbing  In the last 24 hours, has pain interfered with the following? General activity 5 Relation with others 5 Enjoyment of life 7 What TIME of day is your pain at its worst? morning, evening, night Sleep (in general) Good  Pain is worse with: bending, sitting and some activites Pain improves with: medication Relief from Meds: 5  Mobility walk without assistance how many minutes can you walk? 10 ability to climb steps?  yes do you drive?  no  Function not employed: date last employed . Do you have any goals in this area?  yes  Neuro/Psych bowel control problems trouble walking spasms  Prior Studies Any changes since last visit?  no  Physicians involved in your care Any changes since last visit?  no   Family History  Problem Relation Age of Onset  . Arthritis Other   . Diabetes Other   . Hypertension Other   . Hypertension Mother   . Diabetes Sister   . Cancer Neg Hx   . Early death Neg Hx   . Hearing loss Neg Hx   . Heart disease Neg Hx   . Hyperlipidemia Neg Hx   . Kidney disease Neg Hx   . Stroke Neg Hx   . Hypertension Sister    Social History   Social History  . Marital Status: Married    Spouse Name: N/A  . Number of Children: N/A  . Years of Education: N/A   Social History Main Topics  . Smoking status: Never Smoker   . Smokeless tobacco: Never Used  . Alcohol Use: No  . Drug Use: No  . Sexual Activity:  Yes    Birth Control/ Protection: Post-menopausal, Surgical   Other Topics Concern  . None   Social History Narrative   Past Surgical History  Procedure Laterality Date  . Tubal ligation     Past Medical History  Diagnosis Date  . Allergy     rhinitis  . Asthma   . Headache(784.0)   . Anemia   . History of stomach ulcers    BP 128/73 mmHg  Pulse 79  Resp 14  SpO2 98%  LMP 05/17/2012  Opioid Risk Score:   Fall Risk Score:  `1  Depression screen PHQ 2/9  Depression screen [facility] 2/9 08/08/2015 08/25/2014  Decreased Interest 3 0  Down, Depressed, Hopeless 0 0  PHQ - 2 Score 3 0  Altered sleeping 1 -  Tired, decreased energy 1 -  Change in appetite 0 -  Feeling bad or failure about yourself  0 -  Trouble concentrating 0 -  Moving slowly or fidgety/restless 3 -  Suicidal thoughts 0 -  PHQ-9 Score 8 -  Difficult doing work/chores Very difficult -     Review of Systems  Gastrointestinal: Positive for constipation.  Musculoskeletal: Positive for gait problem.       Spasms   Hematological: Bruises/bleeds easily.  All other systems  reviewed and are negative.      Objective:   Physical Exam  Constitutional: She is oriented to person, place, and time. She appears well-developed and well-nourished.  HENT:  Head: Normocephalic and atraumatic.  Neck: Normal range of motion. Neck supple.  Cardiovascular: Normal rate and regular rhythm.   Pulmonary/Chest: Effort normal and breath sounds normal.  Musculoskeletal:  Normal Muscle Bulk and Muscle Testing Reveals: Upper Extremities: Full ROM and Muscle Strength 5/5 Thoracic Paraspinal Tenderness: T-10- T-12 Lumbar Hypersensitivity Lower Extremities: Full ROM and Muscle Strength 5/5 Arises from chair slowly Antalgic gait  Neurological: She is alert and oriented to person, place, and time.  Skin: Skin is warm and dry.  Psychiatric: She has a normal mood and affect.  Nursing note and vitals reviewed.           Assessment & Plan:  1. Myofascial pain syndrome versus fibromyalgia syndrome. Continue Tramadol 50 mg  One tablet every 6 hours as needed #100. 2. Insomnia: Continue nortriptyline  3. Chronic low back pain:Continued physical therapy and HEP. 4. Fibromyalgia:Encouraged to continue with HEP  20 minutes of face to face patient care time was spent during this visit. All questions were encouraged and answered.  F/U in 2 months

## 2015-11-26 ENCOUNTER — Ambulatory Visit (INDEPENDENT_AMBULATORY_CARE_PROVIDER_SITE_OTHER): Payer: Commercial Managed Care - HMO | Admitting: Internal Medicine

## 2015-11-26 ENCOUNTER — Other Ambulatory Visit (INDEPENDENT_AMBULATORY_CARE_PROVIDER_SITE_OTHER): Payer: Commercial Managed Care - HMO

## 2015-11-26 ENCOUNTER — Encounter: Payer: Self-pay | Admitting: Internal Medicine

## 2015-11-26 VITALS — BP 100/78 | HR 74 | Temp 98.4°F | Resp 16 | Ht 61.5 in | Wt 236.0 lb

## 2015-11-26 DIAGNOSIS — Z0000 Encounter for general adult medical examination without abnormal findings: Secondary | ICD-10-CM

## 2015-11-26 DIAGNOSIS — D510 Vitamin B12 deficiency anemia due to intrinsic factor deficiency: Secondary | ICD-10-CM

## 2015-11-26 DIAGNOSIS — L309 Dermatitis, unspecified: Secondary | ICD-10-CM

## 2015-11-26 DIAGNOSIS — Z1211 Encounter for screening for malignant neoplasm of colon: Secondary | ICD-10-CM

## 2015-11-26 DIAGNOSIS — Z1231 Encounter for screening mammogram for malignant neoplasm of breast: Secondary | ICD-10-CM

## 2015-11-26 DIAGNOSIS — K5909 Other constipation: Secondary | ICD-10-CM

## 2015-11-26 DIAGNOSIS — Z23 Encounter for immunization: Secondary | ICD-10-CM

## 2015-11-26 LAB — CBC WITH DIFFERENTIAL/PLATELET
BASOS PCT: 0.7 % (ref 0.0–3.0)
Basophils Absolute: 0 10*3/uL (ref 0.0–0.1)
EOS ABS: 0.1 10*3/uL (ref 0.0–0.7)
Eosinophils Relative: 2.9 % (ref 0.0–5.0)
HCT: 39.7 % (ref 36.0–46.0)
Hemoglobin: 12.7 g/dL (ref 12.0–15.0)
Lymphocytes Relative: 49.1 % — ABNORMAL HIGH (ref 12.0–46.0)
Lymphs Abs: 2 10*3/uL (ref 0.7–4.0)
MCHC: 32 g/dL (ref 30.0–36.0)
MCV: 81.9 fl (ref 78.0–100.0)
MONO ABS: 0.4 10*3/uL (ref 0.1–1.0)
Monocytes Relative: 9.6 % (ref 3.0–12.0)
NEUTROS ABS: 1.5 10*3/uL (ref 1.4–7.7)
NEUTROS PCT: 37.7 % — AB (ref 43.0–77.0)
PLATELETS: 205 10*3/uL (ref 150.0–400.0)
RBC: 4.84 Mil/uL (ref 3.87–5.11)
RDW: 15.9 % — AB (ref 11.5–15.5)
WBC: 4.1 10*3/uL (ref 4.0–10.5)

## 2015-11-26 LAB — LIPID PANEL
CHOLESTEROL: 189 mg/dL (ref 0–200)
HDL: 52.7 mg/dL (ref >39.00)
LDL CALC: 124 mg/dL — AB (ref 0–99)
NonHDL: 136.32
TRIGLYCERIDES: 61 mg/dL (ref 0.0–149.0)
Total CHOL/HDL Ratio: 4
VLDL: 12.2 mg/dL (ref 0.0–40.0)

## 2015-11-26 LAB — COMPREHENSIVE METABOLIC PANEL
ALBUMIN: 3.7 g/dL (ref 3.5–5.2)
ALT: 13 U/L (ref 0–35)
AST: 16 U/L (ref 0–37)
Alkaline Phosphatase: 112 U/L (ref 39–117)
BUN: 13 mg/dL (ref 6–23)
CALCIUM: 9.1 mg/dL (ref 8.4–10.5)
CHLORIDE: 105 meq/L (ref 96–112)
CO2: 26 mEq/L (ref 19–32)
CREATININE: 1.07 mg/dL (ref 0.40–1.20)
GFR: 68.62 mL/min (ref >60.00)
Glucose, Bld: 86 mg/dL (ref 70–99)
POTASSIUM: 4 meq/L (ref 3.5–5.1)
Sodium: 140 mEq/L (ref 135–145)
Total Bilirubin: 0.3 mg/dL (ref 0.2–1.2)
Total Protein: 7.1 g/dL (ref 6.0–8.3)

## 2015-11-26 LAB — FERRITIN: Ferritin: 24.7 ng/mL (ref 10.0–291.0)

## 2015-11-26 LAB — IBC PANEL
IRON: 72 ug/dL (ref 42–145)
Saturation Ratios: 17.7 % — ABNORMAL LOW (ref 20.0–50.0)
TRANSFERRIN: 291 mg/dL (ref 212.0–360.0)

## 2015-11-26 LAB — FOLATE: Folate: 15.2 ng/mL (ref >5.9)

## 2015-11-26 LAB — VITAMIN B12: Vitamin B-12: 331 pg/mL (ref 211–911)

## 2015-11-26 MED ORDER — LUBIPROSTONE 24 MCG PO CAPS: 24.0000 ug | ORAL_CAPSULE | Freq: Two times a day (BID) | ORAL | Status: DC

## 2015-11-26 MED ORDER — TRIAMCINOLONE ACETONIDE 0.5 % EX CREA: TOPICAL_CREAM | Freq: Three times a day (TID) | CUTANEOUS | Status: DC

## 2015-11-26 NOTE — Progress Notes (Signed)
Subjective:  Patient ID: [name], female    DOB: [birth year]  Age: 55 y.o. MRN: [id number]  CC: Annual Exam and Anemia   HPI [name] presents for a physical as well as follow-up on arthritis. She complains of constipation.  Outpatient Prescriptions Prior to Visit  Medication Sig Dispense Refill  . desonide (DESOWEN) 0.05 % lotion APPLY TOPICALLY TWO TIMES DAILY 59 mL 5  . mometasone (NASONEX) 50 MCG/ACT nasal spray Place 4 sprays into the nose daily. For allergies 17 g 11  . nortriptyline (PAMELOR) 10 MG capsule Take 1 capsule (10 mg total) by mouth at bedtime. 30 capsule 1  . potassium chloride SA (K-DUR,KLOR-CON) 20 MEQ tablet Take 1 tablet (20 mEq total) by mouth 2 (two) times daily. 60 tablet 5  . traMADol (ULTRAM) 50 MG tablet Take 1 tablet (50 mg total) by mouth every 6 (six) hours as needed. 100 tablet 2  . triamcinolone cream (KENALOG) 0.5 % Apply topically 3 (three) times daily. 30 g 5   No facility-administered medications prior to visit.    ROS Review of Systems  Constitutional: Negative.  Negative for fever, chills, diaphoresis, appetite change and fatigue.  HENT: Negative.  Negative for sinus pressure, sore throat and trouble swallowing.   Eyes: Negative.   Respiratory: Negative.  Negative for cough, choking, chest tightness, shortness of breath and stridor.   Cardiovascular: Negative.  Negative for chest pain, palpitations and leg swelling.  Gastrointestinal: Positive for constipation. Negative for nausea, vomiting, abdominal pain, diarrhea and blood in stool.  Endocrine: Negative.   Genitourinary: Negative.   Musculoskeletal: Positive for arthralgias. Negative for back pain and joint swelling.  Skin: Positive for rash. Negative for color change.  Allergic/Immunologic: Negative.   Neurological: Negative.   Hematological: Negative.  Negative for adenopathy. Does not bruise/bleed easily.  Psychiatric/Behavioral: Negative.     Objective:  BP 100/78  mmHg  Pulse 74  Temp(Src) 98.4 F (36.9 C) (Oral)  Resp 16  Ht 5' 1.5" (1.562 m)  Wt 236 lb (107.049 kg)  BMI 43.88 kg/m2  SpO2 96%  LMP 05/17/2012  BP Readings from Last 3 Encounters:  11/26/15 100/78  10/20/15 128/73  09/19/15 160/106    Wt Readings from Last 3 Encounters:  11/26/15 236 lb (107.049 kg)  12/11/14 206 lb (93.441 kg)  08/25/14 225 lb (102.059 kg)    Physical Exam  Constitutional: She is oriented to person, place, and time. No distress.  HENT:  Mouth/Throat: No oropharyngeal exudate.  Eyes: Conjunctivae are normal. Right eye exhibits no discharge. Left eye exhibits no discharge. No scleral icterus.  Neck: Normal range of motion. Neck supple. No JVD present. No tracheal deviation present. No thyromegaly present.  Cardiovascular: Normal rate, regular rhythm, normal heart sounds and intact distal pulses.  Exam reveals no gallop and no friction rub.   No murmur heard. Pulmonary/Chest: Effort normal and breath sounds normal. No stridor. No respiratory distress. She has no wheezes. She has no rales. She exhibits no tenderness.  Abdominal: Soft. Bowel sounds are normal. She exhibits no distension and no mass. There is no tenderness. There is no rebound and no guarding.  Musculoskeletal: Normal range of motion. She exhibits no edema or tenderness.  Lymphadenopathy:    She has no cervical adenopathy.  Neurological: She is oriented to person, place, and time.  Skin: Skin is warm and dry. Rash noted. She is not diaphoretic. No erythema. No pallor.  Eczema patches  Psychiatric: She has a normal mood  and affect. Her behavior is normal. Judgment and thought content normal.  Vitals reviewed.   Lab Results  Component Value Date   WBC 4.1 11/26/2015   HGB 12.7 11/26/2015   HCT 39.7 11/26/2015   PLT 205.0 11/26/2015   GLUCOSE 86 11/26/2015   CHOL 189 11/26/2015   TRIG 61.0 11/26/2015   HDL 52.70 11/26/2015   LDLCALC 124* 11/26/2015   ALT 13 11/26/2015   AST 16  11/26/2015   NA 140 11/26/2015   K 4.0 11/26/2015   CL 105 11/26/2015   CREATININE 1.07 11/26/2015   BUN 13 11/26/2015   CO2 26 11/26/2015   TSH 1.05 08/23/2014    Ct Abdomen Pelvis W Contrast  03/07/2015  CLINICAL DATA:  55 year old female with right lower quadrant pain radiating down into the right leg for the past 2 weeks, with some associated nausea. EXAM: CT ABDOMEN AND PELVIS WITH CONTRAST TECHNIQUE: Multidetector CT imaging of the abdomen and pelvis was performed using the standard protocol following bolus administration of intravenous contrast. CONTRAST:  119mL OMNIPAQUE IOHEXOL 300 MG/ML  SOLN COMPARISON:  CT of the abdomen and pelvis 05/18/2012. FINDINGS: Lower chest:  Unremarkable. Hepatobiliary: No suspicious cystic or solid hepatic lesions. No intra or extrahepatic biliary ductal dilatation. Gallbladder is normal in appearance. Pancreas: Unremarkable. Spleen: Unremarkable. Adrenals/Urinary Tract: Bilateral adrenal glands and bilateral kidneys are normal in appearance. No hydroureteronephrosis. Urinary bladder is normal in appearance. Stomach/Bowel: Normal appearance of the stomach. No pathologic dilatation of small bowel or colon. The appendix is not confidently identified and may be surgically absent. Regardless, no inflammatory changes are noted adjacent to the cecum to suggest the presence of an appendicitis at this time. No focal mural thickening or surrounding inflammatory changes are noted associated with the small bowel or colon. Terminal ileum is grossly unremarkable in appearance. Vascular/Lymphatic: Mild atherosclerosis in the abdominal and pelvic vasculature, without evidence of aneurysm or dissection. No lymphadenopathy noted in the abdomen or pelvis. Reproductive: Uterus and ovaries are unremarkable in appearance. Other: No significant volume of ascites.  No pneumoperitoneum. Musculoskeletal: There are no aggressive appearing lytic or blastic lesions noted in the visualized  portions of the skeleton. IMPRESSION: 1. No acute findings in the abdomen or pelvis to account for the patient's symptoms. 2. Incidental findings, as above. Electronically Signed   By: Vinnie Langton M.D.   On: 03/07/2015 10:11    Assessment & Plan:   [name] was seen today for annual exam and anemia.  Diagnoses and all orders for this visit:  Eczema -     Discontinue: triamcinolone cream (KENALOG) 0.5 %; Apply topically 3 (three) times daily. -     triamcinolone cream (KENALOG) 0.5 %; Apply topically 3 (three) times daily.  Routine general medical examination at a health care facility -     Lipid panel; Future -     Comprehensive metabolic panel; Future -     CBC with Differential/Platelet; Future -     Hepatitis C antibody; Future -     HIV antibody; Future  Pernicious anemia- improvement noted, her vitamin levels are normal. -     IBC panel; Future -     Vitamin B12; Future -     Ferritin; Future -     Folate; Future  Encounter for immunization  Other constipation- this is most likely related to her tramadol therapy, her labs today do not show any evidence of metabolic causes, will treat this with Amitiza -     lubiprostone (AMITIZA) 24  MCG capsule; Take 1 capsule (24 mcg total) by mouth 2 (two) times daily with a meal.  Encounter for screening mammogram for breast cancer -     MM DIGITAL SCREENING BILATERAL; Future  Colon cancer screening -     Ambulatory referral to Gastroenterology  Other orders -     Flu Vaccine QUAD 36+ mos IM   I am having Ms. Tumlin start on lubiprostone. I am also having her maintain her mometasone, potassium chloride SA, desonide, traMADol, nortriptyline, gabapentin, gabapentin, and triamcinolone cream.  Meds ordered this encounter  Medications  . gabapentin (NEURONTIN) 100 MG capsule    Sig:   . gabapentin (NEURONTIN) 300 MG capsule    Sig:   . DISCONTD: triamcinolone cream (KENALOG) 0.5 %    Sig: Apply topically 3 (three) times daily.      Dispense:  30 g    Refill:  5  . lubiprostone (AMITIZA) 24 MCG capsule    Sig: Take 1 capsule (24 mcg total) by mouth 2 (two) times daily with a meal.    Dispense:  180 capsule    Refill:  3  . triamcinolone cream (KENALOG) 0.5 %    Sig: Apply topically 3 (three) times daily.    Dispense:  100 g    Refill:  5   See AVS for instructions about healthy living and anticipatory guidance.  Follow-up: Return if symptoms worsen or fail to improve.  [name], MD

## 2015-11-26 NOTE — Patient Instructions (Signed)
Preventive Care for Adults, Female A healthy lifestyle and preventive care can promote health and wellness. Preventive health guidelines for women include the following key practices.  A routine yearly physical is a good way to check with your health care provider about your health and preventive screening. It is a chance to share any concerns and updates on your health and to receive a thorough exam.  Visit your dentist for a routine exam and preventive care every 6 months. Brush your teeth twice a day and floss once a day. Good oral hygiene prevents tooth decay and gum disease.  The frequency of eye exams is based on your age, health, family medical history, use of contact lenses, and other factors. Follow your health care provider's recommendations for frequency of eye exams.  Eat a healthy diet. Foods like vegetables, fruits, whole grains, low-fat dairy products, and lean protein foods contain the nutrients you need without too many calories. Decrease your intake of foods high in solid fats, added sugars, and salt. Eat the right amount of calories for you.Get information about a proper diet from your health care provider, if necessary.  Regular physical exercise is one of the most important things you can do for your health. Most adults should get at least 150 minutes of moderate-intensity exercise (any activity that increases your heart rate and causes you to sweat) each week. In addition, most adults need muscle-strengthening exercises on 2 or more days a week.  Maintain a healthy weight. The body mass index (BMI) is a screening tool to identify possible weight problems. It provides an estimate of body fat based on height and weight. Your health care provider can find your BMI and can help you achieve or maintain a healthy weight.For adults 20 years and older:  A BMI below 18.5 is considered underweight.  A BMI of 18.5 to 24.9 is normal.  A BMI of 25 to 29.9 is considered overweight.  A  BMI of 30 and above is considered obese.  Maintain normal blood lipids and cholesterol levels by exercising and minimizing your intake of saturated fat. Eat a balanced diet with plenty of fruit and vegetables. Blood tests for lipids and cholesterol should begin at age 45 and be repeated every 5 years. If your lipid or cholesterol levels are high, you are over 50, or you are at high risk for heart disease, you may need your cholesterol levels checked more frequently.Ongoing high lipid and cholesterol levels should be treated with medicines if diet and exercise are not working.  If you smoke, find out from your health care provider how to quit. If you do not use tobacco, do not start.  Lung cancer screening is recommended for adults aged 45-80 years who are at high risk for developing lung cancer because of a history of smoking. A yearly low-dose CT scan of the lungs is recommended for people who have at least a 30-pack-year history of smoking and are a current smoker or have quit within the past 15 years. A pack year of smoking is smoking an average of 1 pack of cigarettes a day for 1 year (for example: 1 pack a day for 30 years or 2 packs a day for 15 years). Yearly screening should continue until the smoker has stopped smoking for at least 15 years. Yearly screening should be stopped for people who develop a health problem that would prevent them from having lung cancer treatment.  If you are pregnant, do not drink alcohol. If you are  breastfeeding, be very cautious about drinking alcohol. If you are not pregnant and choose to drink alcohol, do not have more than 1 drink per day. One drink is considered to be 12 ounces (355 mL) of beer, 5 ounces (148 mL) of wine, or 1.5 ounces (44 mL) of liquor.  Avoid use of street drugs. Do not share needles with anyone. Ask for help if you need support or instructions about stopping the use of drugs.  High blood pressure causes heart disease and increases the risk  of stroke. Your blood pressure should be checked at least every 1 to 2 years. Ongoing high blood pressure should be treated with medicines if weight loss and exercise do not work.  If you are 55-79 years old, ask your health care provider if you should take aspirin to prevent strokes.  Diabetes screening is done by taking a blood sample to check your blood glucose level after you have not eaten for a certain period of time (fasting). If you are not overweight and you do not have risk factors for diabetes, you should be screened once every 3 years starting at age 45. If you are overweight or obese and you are 40-70 years of age, you should be screened for diabetes every year as part of your cardiovascular risk assessment.  Breast cancer screening is essential preventive care for women. You should practice "breast self-awareness." This means understanding the normal appearance and feel of your breasts and may include breast self-examination. Any changes detected, no matter how small, should be reported to a health care provider. Women in their 20s and 30s should have a clinical breast exam (CBE) by a health care provider as part of a regular health exam every 1 to 3 years. After age 40, women should have a CBE every year. Starting at age 40, women should consider having a mammogram (breast X-ray test) every year. Women who have a family history of breast cancer should talk to their health care provider about genetic screening. Women at a high risk of breast cancer should talk to their health care providers about having an MRI and a mammogram every year.  Breast cancer gene (BRCA)-related cancer risk assessment is recommended for women who have family members with BRCA-related cancers. BRCA-related cancers include breast, ovarian, tubal, and peritoneal cancers. Having family members with these cancers may be associated with an increased risk for harmful changes (mutations) in the breast cancer genes BRCA1 and  BRCA2. Results of the assessment will determine the need for genetic counseling and BRCA1 and BRCA2 testing.  Your health care provider may recommend that you be screened regularly for cancer of the pelvic organs (ovaries, uterus, and vagina). This screening involves a pelvic examination, including checking for microscopic changes to the surface of your cervix (Pap test). You may be encouraged to have this screening done every 3 years, beginning at age 21.  For women ages 30-65, health care providers may recommend pelvic exams and Pap testing every 3 years, or they may recommend the Pap and pelvic exam, combined with testing for human papilloma virus (HPV), every 5 years. Some types of HPV increase your risk of cervical cancer. Testing for HPV may also be done on women of any age with unclear Pap test results.  Other health care providers may not recommend any screening for nonpregnant women who are considered low risk for pelvic cancer and who do not have symptoms. Ask your health care provider if a screening pelvic exam is right for   you.  If you have had past treatment for cervical cancer or a condition that could lead to cancer, you need Pap tests and screening for cancer for at least 20 years after your treatment. If Pap tests have been discontinued, your risk factors (such as having a new sexual partner) need to be reassessed to determine if screening should resume. Some women have medical problems that increase the chance of getting cervical cancer. In these cases, your health care provider may recommend more frequent screening and Pap tests.  Colorectal cancer can be detected and often prevented. Most routine colorectal cancer screening begins at the age of 50 years and continues through age 75 years. However, your health care provider may recommend screening at an earlier age if you have risk factors for colon cancer. On a yearly basis, your health care provider may provide home test kits to check  for hidden blood in the stool. Use of a small camera at the end of a tube, to directly examine the colon (sigmoidoscopy or colonoscopy), can detect the earliest forms of colorectal cancer. Talk to your health care provider about this at age 50, when routine screening begins. Direct exam of the colon should be repeated every 5-10 years through age 75 years, unless early forms of precancerous polyps or small growths are found.  People who are at an increased risk for hepatitis B should be screened for this virus. You are considered at high risk for hepatitis B if:  You were born in a country where hepatitis B occurs often. Talk with your health care provider about which countries are considered high risk.  Your parents were born in a high-risk country and you have not received a shot to protect against hepatitis B (hepatitis B vaccine).  You have HIV or AIDS.  You use needles to inject street drugs.  You live with, or have sex with, someone who has hepatitis B.  You get hemodialysis treatment.  You take certain medicines for conditions like cancer, organ transplantation, and autoimmune conditions.  Hepatitis C blood testing is recommended for all people born from 1945 through 1965 and any individual with known risks for hepatitis C.  Practice safe sex. Use condoms and avoid high-risk sexual practices to reduce the spread of sexually transmitted infections (STIs). STIs include gonorrhea, chlamydia, syphilis, trichomonas, herpes, HPV, and human immunodeficiency virus (HIV). Herpes, HIV, and HPV are viral illnesses that have no cure. They can result in disability, cancer, and death.  You should be screened for sexually transmitted illnesses (STIs) including gonorrhea and chlamydia if:  You are sexually active and are younger than 24 years.  You are older than 24 years and your health care provider tells you that you are at risk for this type of infection.  Your sexual activity has changed  since you were last screened and you are at an increased risk for chlamydia or gonorrhea. Ask your health care provider if you are at risk.  If you are at risk of being infected with HIV, it is recommended that you take a prescription medicine daily to prevent HIV infection. This is called preexposure prophylaxis (PrEP). You are considered at risk if:  You are sexually active and do not regularly use condoms or know the HIV status of your partner(s).  You take drugs by injection.  You are sexually active with a partner who has HIV.  Talk with your health care provider about whether you are at high risk of being infected with HIV. If   you choose to begin PrEP, you should first be tested for HIV. You should then be tested every 3 months for as long as you are taking PrEP.  Osteoporosis is a disease in which the bones lose minerals and strength with aging. This can result in serious bone fractures or breaks. The risk of osteoporosis can be identified using a bone density scan. Women ages 67 years and over and women at risk for fractures or osteoporosis should discuss screening with their health care providers. Ask your health care provider whether you should take a calcium supplement or vitamin D to reduce the rate of osteoporosis.  Menopause can be associated with physical symptoms and risks. Hormone replacement therapy is available to decrease symptoms and risks. You should talk to your health care provider about whether hormone replacement therapy is right for you.  Use sunscreen. Apply sunscreen liberally and repeatedly throughout the day. You should seek shade when your shadow is shorter than you. Protect yourself by wearing long sleeves, pants, a wide-brimmed hat, and sunglasses year round, whenever you are outdoors.  Once a month, do a whole body skin exam, using a mirror to look at the skin on your back. Tell your health care provider of new moles, moles that have irregular borders, moles that  are larger than a pencil eraser, or moles that have changed in shape or color.  Stay current with required vaccines (immunizations).  Influenza vaccine. All adults should be immunized every year.  Tetanus, diphtheria, and acellular pertussis (Td, Tdap) vaccine. Pregnant women should receive 1 dose of Tdap vaccine during each pregnancy. The dose should be obtained regardless of the length of time since the last dose. Immunization is preferred during the 27th-36th week of gestation. An adult who has not previously received Tdap or who does not know her vaccine status should receive 1 dose of Tdap. This initial dose should be followed by tetanus and diphtheria toxoids (Td) booster doses every 10 years. Adults with an unknown or incomplete history of completing a 3-dose immunization series with Td-containing vaccines should begin or complete a primary immunization series including a Tdap dose. Adults should receive a Td booster every 10 years.  Varicella vaccine. An adult without evidence of immunity to varicella should receive 2 doses or a second dose if she has previously received 1 dose. Pregnant females who do not have evidence of immunity should receive the first dose after pregnancy. This first dose should be obtained before leaving the health care facility. The second dose should be obtained 4-8 weeks after the first dose.  Human papillomavirus (HPV) vaccine. Females aged 13-26 years who have not received the vaccine previously should obtain the 3-dose series. The vaccine is not recommended for use in pregnant females. However, pregnancy testing is not needed before receiving a dose. If a female is found to be pregnant after receiving a dose, no treatment is needed. In that case, the remaining doses should be delayed until after the pregnancy. Immunization is recommended for any person with an immunocompromised condition through the age of 61 years if she did not get any or all doses earlier. During the  3-dose series, the second dose should be obtained 4-8 weeks after the first dose. The third dose should be obtained 24 weeks after the first dose and 16 weeks after the second dose.  Zoster vaccine. One dose is recommended for adults aged 30 years or older unless certain conditions are present.  Measles, mumps, and rubella (MMR) vaccine. Adults born  before 1957 generally are considered immune to measles and mumps. Adults born in 1957 or later should have 1 or more doses of MMR vaccine unless there is a contraindication to the vaccine or there is laboratory evidence of immunity to each of the three diseases. A routine second dose of MMR vaccine should be obtained at least 28 days after the first dose for students attending postsecondary schools, health care workers, or international travelers. People who received inactivated measles vaccine or an unknown type of measles vaccine during 1963-1967 should receive 2 doses of MMR vaccine. People who received inactivated mumps vaccine or an unknown type of mumps vaccine before 1979 and are at high risk for mumps infection should consider immunization with 2 doses of MMR vaccine. For females of childbearing age, rubella immunity should be determined. If there is no evidence of immunity, females who are not pregnant should be vaccinated. If there is no evidence of immunity, females who are pregnant should delay immunization until after pregnancy. Unvaccinated health care workers born before 1957 who lack laboratory evidence of measles, mumps, or rubella immunity or laboratory confirmation of disease should consider measles and mumps immunization with 2 doses of MMR vaccine or rubella immunization with 1 dose of MMR vaccine.  Pneumococcal 13-valent conjugate (PCV13) vaccine. When indicated, a person who is uncertain of his immunization history and has no record of immunization should receive the PCV13 vaccine. All adults 65 years of age and older should receive this  vaccine. An adult aged 19 years or older who has certain medical conditions and has not been previously immunized should receive 1 dose of PCV13 vaccine. This PCV13 should be followed with a dose of pneumococcal polysaccharide (PPSV23) vaccine. Adults who are at high risk for pneumococcal disease should obtain the PPSV23 vaccine at least 8 weeks after the dose of PCV13 vaccine. Adults older than 55 years of age who have normal immune system function should obtain the PPSV23 vaccine dose at least 1 year after the dose of PCV13 vaccine.  Pneumococcal polysaccharide (PPSV23) vaccine. When PCV13 is also indicated, PCV13 should be obtained first. All adults aged 65 years and older should be immunized. An adult younger than age 65 years who has certain medical conditions should be immunized. Any person who resides in a nursing home or long-term care facility should be immunized. An adult smoker should be immunized. People with an immunocompromised condition and certain other conditions should receive both PCV13 and PPSV23 vaccines. People with human immunodeficiency virus (HIV) infection should be immunized as soon as possible after diagnosis. Immunization during chemotherapy or radiation therapy should be avoided. Routine use of PPSV23 vaccine is not recommended for American Indians, [place] Natives, or people younger than 65 years unless there are medical conditions that require PPSV23 vaccine. When indicated, people who have unknown immunization and have no record of immunization should receive PPSV23 vaccine. One-time revaccination 5 years after the first dose of PPSV23 is recommended for people aged 19-64 years who have chronic kidney failure, nephrotic syndrome, asplenia, or immunocompromised conditions. People who received 1-2 doses of PPSV23 before age 65 years should receive another dose of PPSV23 vaccine at age 65 years or later if at least 5 years have passed since the previous dose. Doses of PPSV23 are not  needed for people immunized with PPSV23 at or after age 65 years.  Meningococcal vaccine. Adults with asplenia or persistent complement component deficiencies should receive 2 doses of quadrivalent meningococcal conjugate (MenACWY-D) vaccine. The doses should be obtained   at least 2 months apart. Microbiologists working with certain meningococcal bacteria, [place] recruits, people at risk during an outbreak, and people who travel to or live in countries with a high rate of meningitis should be immunized. A first-year college student up through age 34 years who is living in a residence hall should receive a dose if she did not receive a dose on or after her 16th birthday. Adults who have certain high-risk conditions should receive one or more doses of vaccine.  Hepatitis A vaccine. Adults who wish to be protected from this disease, have certain high-risk conditions, work with hepatitis A-infected animals, work in hepatitis A research labs, or travel to or work in countries with a high rate of hepatitis A should be immunized. Adults who were previously unvaccinated and who anticipate close contact with an international adoptee during the first 60 days after arrival in the [place] from a country with a high rate of hepatitis A should be immunized.  Hepatitis B vaccine. Adults who wish to be protected from this disease, have certain high-risk conditions, may be exposed to blood or other infectious body fluids, are household contacts or sex partners of hepatitis B positive people, are clients or workers in certain care facilities, or travel to or work in countries with a high rate of hepatitis B should be immunized.  Haemophilus influenzae type b (Hib) vaccine. A previously unvaccinated person with asplenia or sickle cell disease or having a scheduled splenectomy should receive 1 dose of Hib vaccine. Regardless of previous immunization, a recipient of a hematopoietic stem cell transplant should receive a  3-dose series 6-12 months after her successful transplant. Hib vaccine is not recommended for adults with HIV infection. Preventive Services / Frequency Ages 35 to 4 years  Blood pressure check.** / Every 3-5 years.  Lipid and cholesterol check.** / Every 5 years beginning at age 60.  Clinical breast exam.** / Every 3 years for women in their 71s and 10s.  BRCA-related cancer risk assessment.** / For women who have family members with a BRCA-related cancer (breast, ovarian, tubal, or peritoneal cancers).  Pap test.** / Every 2 years from ages 76 through 26. Every 3 years starting at age 61 through age 76 or 93 with a history of 3 consecutive normal Pap tests.  HPV screening.** / Every 3 years from ages 37 through ages 60 to 51 with a history of 3 consecutive normal Pap tests.  Hepatitis C blood test.** / For any individual with known risks for hepatitis C.  Skin self-exam. / Monthly.  Influenza vaccine. / Every year.  Tetanus, diphtheria, and acellular pertussis (Tdap, Td) vaccine.** / Consult your health care provider. Pregnant women should receive 1 dose of Tdap vaccine during each pregnancy. 1 dose of Td every 10 years.  Varicella vaccine.** / Consult your health care provider. Pregnant females who do not have evidence of immunity should receive the first dose after pregnancy.  HPV vaccine. / 3 doses over 6 months, if 93 and younger. The vaccine is not recommended for use in pregnant females. However, pregnancy testing is not needed before receiving a dose.  Measles, mumps, rubella (MMR) vaccine.** / You need at least 1 dose of MMR if you were born in 1957 or later. You may also need a 2nd dose. For females of childbearing age, rubella immunity should be determined. If there is no evidence of immunity, females who are not pregnant should be vaccinated. If there is no evidence of immunity, females who are  pregnant should delay immunization until after pregnancy.  Pneumococcal  13-valent conjugate (PCV13) vaccine.** / Consult your health care provider.  Pneumococcal polysaccharide (PPSV23) vaccine.** / 1 to 2 doses if you smoke cigarettes or if you have certain conditions.  Meningococcal vaccine.** / 1 dose if you are age 68 to 8 years and a Market researcher living in a residence hall, or have one of several medical conditions, you need to get vaccinated against meningococcal disease. You may also need additional booster doses.  Hepatitis A vaccine.** / Consult your health care provider.  Hepatitis B vaccine.** / Consult your health care provider.  Haemophilus influenzae type b (Hib) vaccine.** / Consult your health care provider. Ages 7 to 53 years  Blood pressure check.** / Every year.  Lipid and cholesterol check.** / Every 5 years beginning at age 25 years.  Lung cancer screening. / Every year if you are aged 11-80 years and have a 30-pack-year history of smoking and currently smoke or have quit within the past 15 years. Yearly screening is stopped once you have quit smoking for at least 15 years or develop a health problem that would prevent you from having lung cancer treatment.  Clinical breast exam.** / Every year after age 48 years.  BRCA-related cancer risk assessment.** / For women who have family members with a BRCA-related cancer (breast, ovarian, tubal, or peritoneal cancers).  Mammogram.** / Every year beginning at age 41 years and continuing for as long as you are in good health. Consult with your health care provider.  Pap test.** / Every 3 years starting at age 65 years through age 37 or 70 years with a history of 3 consecutive normal Pap tests.  HPV screening.** / Every 3 years from ages 72 years through ages 60 to 40 years with a history of 3 consecutive normal Pap tests.  Fecal occult blood test (FOBT) of stool. / Every year beginning at age 21 years and continuing until age 5 years. You may not need to do this test if you get  a colonoscopy every 10 years.  Flexible sigmoidoscopy or colonoscopy.** / Every 5 years for a flexible sigmoidoscopy or every 10 years for a colonoscopy beginning at age 35 years and continuing until age 48 years.  Hepatitis C blood test.** / For all people born from [birth year] through 1965 and any individual with known risks for hepatitis C.  Skin self-exam. / Monthly.  Influenza vaccine. / Every year.  Tetanus, diphtheria, and acellular pertussis (Tdap/Td) vaccine.** / Consult your health care provider. Pregnant women should receive 1 dose of Tdap vaccine during each pregnancy. 1 dose of Td every 10 years.  Varicella vaccine.** / Consult your health care provider. Pregnant females who do not have evidence of immunity should receive the first dose after pregnancy.  Zoster vaccine.** / 1 dose for adults aged 30 years or older.  Measles, mumps, rubella (MMR) vaccine.** / You need at least 1 dose of MMR if you were born in 1957 or later. You may also need a second dose. For females of childbearing age, rubella immunity should be determined. If there is no evidence of immunity, females who are not pregnant should be vaccinated. If there is no evidence of immunity, females who are pregnant should delay immunization until after pregnancy.  Pneumococcal 13-valent conjugate (PCV13) vaccine.** / Consult your health care provider.  Pneumococcal polysaccharide (PPSV23) vaccine.** / 1 to 2 doses if you smoke cigarettes or if you have certain conditions.  Meningococcal vaccine.** /  Consult your health care provider.  Hepatitis A vaccine.** / Consult your health care provider.  Hepatitis B vaccine.** / Consult your health care provider.  Haemophilus influenzae type b (Hib) vaccine.** / Consult your health care provider. Ages 64 years and over  Blood pressure check.** / Every year.  Lipid and cholesterol check.** / Every 5 years beginning at age 23 years.  Lung cancer screening. / Every year if you  are aged 16-80 years and have a 30-pack-year history of smoking and currently smoke or have quit within the past 15 years. Yearly screening is stopped once you have quit smoking for at least 15 years or develop a health problem that would prevent you from having lung cancer treatment.  Clinical breast exam.** / Every year after age 74 years.  BRCA-related cancer risk assessment.** / For women who have family members with a BRCA-related cancer (breast, ovarian, tubal, or peritoneal cancers).  Mammogram.** / Every year beginning at age 44 years and continuing for as long as you are in good health. Consult with your health care provider.  Pap test.** / Every 3 years starting at age 58 years through age 22 or 39 years with 3 consecutive normal Pap tests. Testing can be stopped between 65 and 70 years with 3 consecutive normal Pap tests and no abnormal Pap or HPV tests in the past 10 years.  HPV screening.** / Every 3 years from ages 64 years through ages 70 or 61 years with a history of 3 consecutive normal Pap tests. Testing can be stopped between 65 and 70 years with 3 consecutive normal Pap tests and no abnormal Pap or HPV tests in the past 10 years.  Fecal occult blood test (FOBT) of stool. / Every year beginning at age 40 years and continuing until age 27 years. You may not need to do this test if you get a colonoscopy every 10 years.  Flexible sigmoidoscopy or colonoscopy.** / Every 5 years for a flexible sigmoidoscopy or every 10 years for a colonoscopy beginning at age 7 years and continuing until age 32 years.  Hepatitis C blood test.** / For all people born from [birth year] through 1965 and any individual with known risks for hepatitis C.  Osteoporosis screening.** / A one-time screening for women ages 30 years and over and women at risk for fractures or osteoporosis.  Skin self-exam. / Monthly.  Influenza vaccine. / Every year.  Tetanus, diphtheria, and acellular pertussis (Tdap/Td)  vaccine.** / 1 dose of Td every 10 years.  Varicella vaccine.** / Consult your health care provider.  Zoster vaccine.** / 1 dose for adults aged 35 years or older.  Pneumococcal 13-valent conjugate (PCV13) vaccine.** / Consult your health care provider.  Pneumococcal polysaccharide (PPSV23) vaccine.** / 1 dose for all adults aged 46 years and older.  Meningococcal vaccine.** / Consult your health care provider.  Hepatitis A vaccine.** / Consult your health care provider.  Hepatitis B vaccine.** / Consult your health care provider.  Haemophilus influenzae type b (Hib) vaccine.** / Consult your health care provider. ** Family history and personal history of risk and conditions may change your health care provider's recommendations.   This information is not intended to replace advice given to you by your health care provider. Make sure you discuss any questions you have with your health care provider.   Document Released: 12/21/2001 Document Revised: 11/15/2014 Document Reviewed: 03/22/2011 Elsevier Interactive Patient Education [employer].

## 2015-11-26 NOTE — Progress Notes (Signed)
Pre visit review using our clinic review tool, if applicable. No additional management support is needed unless otherwise documented below in the visit note. 

## 2015-11-27 ENCOUNTER — Other Ambulatory Visit: Payer: Self-pay | Admitting: Internal Medicine

## 2015-11-27 ENCOUNTER — Encounter: Payer: Self-pay | Admitting: Internal Medicine

## 2015-11-27 ENCOUNTER — Other Ambulatory Visit: Payer: Self-pay

## 2015-11-27 DIAGNOSIS — Z1231 Encounter for screening mammogram for malignant neoplasm of breast: Secondary | ICD-10-CM

## 2015-11-27 LAB — HIV ANTIBODY (ROUTINE TESTING W REFLEX): HIV 1&2 Ab, 4th Generation: NONREACTIVE

## 2015-11-27 LAB — HEPATITIS C ANTIBODY: HCV AB: NEGATIVE

## 2015-11-28 NOTE — Assessment & Plan Note (Signed)

## 2015-12-02 LAB — HM MAMMOGRAPHY

## 2015-12-03 ENCOUNTER — Telehealth: Payer: Self-pay | Admitting: *Deleted

## 2015-12-03 NOTE — Telephone Encounter (Signed)
Receive call pt states md rx cream for eczema and its not working. Would like to get rx for Desonide lotion...[name]

## 2015-12-04 MED ORDER — DESONIDE 0.05 % EX CREA: TOPICAL_CREAM | Freq: Two times a day (BID) | CUTANEOUS | Status: DC

## 2015-12-04 NOTE — Telephone Encounter (Signed)
OK Desonide Thx

## 2015-12-04 NOTE — Telephone Encounter (Signed)
Notified pt rx sent to pharmacy../[facility] 

## 2015-12-04 NOTE — Telephone Encounter (Signed)
Pls advise on msg from yesterday...[name]

## 2015-12-05 ENCOUNTER — Ambulatory Visit
Admission: RE | Admit: 2015-12-05 | Discharge: 2015-12-05 | Disposition: A | Discharge status: Home / Self Care | Payer: Commercial Managed Care - HMO | Source: Ambulatory Visit

## 2015-12-05 ENCOUNTER — Ambulatory Visit: Payer: Commercial Managed Care - HMO

## 2015-12-05 DIAGNOSIS — Z1231 Encounter for screening mammogram for malignant neoplasm of breast: Secondary | ICD-10-CM

## 2015-12-07 NOTE — Addendum Note (Signed)
Addended by: [name] on: 12/07/2015 11:09 AM   Modules accepted: Miquel Dunn

## 2015-12-08 ENCOUNTER — Telehealth: Payer: Self-pay | Admitting: *Deleted

## 2015-12-08 DIAGNOSIS — M5126 Other intervertebral disc displacement, lumbar region: Secondary | ICD-10-CM

## 2015-12-08 DIAGNOSIS — M170 Bilateral primary osteoarthritis of knee: Secondary | ICD-10-CM

## 2015-12-08 MED ORDER — TRAMADOL HCL 50 MG PO TABS: 50.0000 mg | ORAL_TABLET | Freq: Four times a day (QID) | ORAL | Status: DC | PRN

## 2015-12-08 NOTE — Telephone Encounter (Deleted)
Pt states the Desonide is too expensive. Wondering if there is another alternative that needs to be sure it is sensitive for her face.   She is also needing her prescription for traMADol (ULTRAM) 50 MG tablet [id number] by the 1st  Pharmacy is [facility] in [place].

## 2015-12-08 NOTE — Telephone Encounter (Signed)
done

## 2015-12-08 NOTE — Telephone Encounter (Signed)
Pt states the Desonide is too expensive. Wondering if there is another alternative that needs to be sure it is sensitive for her face.   She is also needing her prescription for traMADol (ULTRAM) 50 MG tablet [id number] by the 1st  Pharmacy is [facility] in [place].

## 2015-12-08 NOTE — Telephone Encounter (Signed)
Pt needs to check w/her ins re: what creams are covered Thx

## 2015-12-08 NOTE — Telephone Encounter (Signed)
faxed

## 2015-12-08 NOTE — Telephone Encounter (Signed)
She is also needing her prescription for traMADol (ULTRAM) 50 MG tablet [id number] by the 1st  Pharmacy is [facility] in [place].

## 2015-12-08 NOTE — Telephone Encounter (Signed)
Called pt no answer LMOM with md response.../lmb 

## 2015-12-19 ENCOUNTER — Encounter: Payer: Self-pay | Admitting: Physical Medicine & Rehabilitation

## 2015-12-19 ENCOUNTER — Ambulatory Visit (HOSPITAL_BASED_OUTPATIENT_CLINIC_OR_DEPARTMENT_OTHER): Payer: Commercial Managed Care - HMO | Admitting: Physical Medicine & Rehabilitation

## 2015-12-19 ENCOUNTER — Encounter: Payer: Commercial Managed Care - HMO | Attending: Physical Medicine & Rehabilitation

## 2015-12-19 VITALS — BP 119/81 | HR 73 | Resp 14

## 2015-12-19 DIAGNOSIS — D649 Anemia, unspecified: Secondary | ICD-10-CM | POA: Insufficient documentation

## 2015-12-19 DIAGNOSIS — Z79899 Other long term (current) drug therapy: Secondary | ICD-10-CM | POA: Diagnosis not present

## 2015-12-19 DIAGNOSIS — J45909 Unspecified asthma, uncomplicated: Secondary | ICD-10-CM | POA: Diagnosis not present

## 2015-12-19 DIAGNOSIS — M7071 Other bursitis of hip, right hip: Secondary | ICD-10-CM | POA: Insufficient documentation

## 2015-12-19 DIAGNOSIS — G894 Chronic pain syndrome: Secondary | ICD-10-CM | POA: Diagnosis not present

## 2015-12-19 DIAGNOSIS — M7072 Other bursitis of hip, left hip: Secondary | ICD-10-CM | POA: Diagnosis not present

## 2015-12-19 DIAGNOSIS — M47816 Spondylosis without myelopathy or radiculopathy, lumbar region: Secondary | ICD-10-CM | POA: Diagnosis not present

## 2015-12-19 DIAGNOSIS — S73191D Other sprain of right hip, subsequent encounter: Secondary | ICD-10-CM | POA: Diagnosis not present

## 2015-12-19 DIAGNOSIS — M797 Fibromyalgia: Secondary | ICD-10-CM | POA: Diagnosis not present

## 2015-12-19 DIAGNOSIS — Z8711 Personal history of peptic ulcer disease: Secondary | ICD-10-CM | POA: Insufficient documentation

## 2015-12-19 DIAGNOSIS — M1288 Other specific arthropathies, not elsewhere classified, other specified site: Secondary | ICD-10-CM | POA: Diagnosis not present

## 2015-12-19 DIAGNOSIS — Z5181 Encounter for therapeutic drug level monitoring: Secondary | ICD-10-CM | POA: Insufficient documentation

## 2015-12-19 MED ORDER — NORTRIPTYLINE HCL 10 MG PO CAPS: 10.0000 mg | ORAL_CAPSULE | Freq: Every day | ORAL | Status: DC

## 2015-12-19 NOTE — Progress Notes (Signed)
Subjective:    Patient ID: [name], female    DOB: 01-22-1961, 55 y.o.   MRN: [id number]  HPI 55 year old female with history of fibromyalgia and myofascial pain. She has a primary complaint of right hip pain. She did not get any significant relief after palpation guided greater trochanter injection on the right side. She has had no recent falls or trauma.Had a fall about one month ago was sore but no other injury.  Has some numbness in the anterior part of the thigh on the right side. Feels like the leg sometimes gives out.     Pain Inventory Average Pain 8 Pain Right Now 7 My pain is constant and sharp  In the last 24 hours, has pain interfered with the following? General activity 6 Relation with others 10 Enjoyment of life 0 What TIME of day is your pain at its worst? morning Sleep (in general) Good  Pain is worse with: sitting Pain improves with: therapy/exercise and medication Relief from Meds: 5  Mobility walk without assistance how many minutes can you walk? 10 ability to climb steps?  yes do you drive?  yes  Function not employed: date last employed NA  Neuro/Psych bowel control problems weakness trouble walking spasms  Prior Studies Any changes since last visit?  no  Physicians involved in your care Any changes since last visit?  no   Family History  Problem Relation Age of Onset  . Arthritis Other   . Diabetes Other   . Hypertension Other   . Hypertension Mother   . Diabetes Sister   . Cancer Neg Hx   . Early death Neg Hx   . Hearing loss Neg Hx   . Heart disease Neg Hx   . Hyperlipidemia Neg Hx   . Kidney disease Neg Hx   . Stroke Neg Hx   . Hypertension Sister    Social History   Social History  . Marital Status: Married    Spouse Name: N/A  . Number of Children: N/A  . Years of Education: N/A   Social History Main Topics  . Smoking status: Never Smoker   . Smokeless tobacco: Never Used  . Alcohol Use: No  . Drug Use:  No  . Sexual Activity: Yes    Birth Control/ Protection: Post-menopausal, Surgical   Other Topics Concern  . None   Social History Narrative   Past Surgical History  Procedure Laterality Date  . Tubal ligation     Past Medical History  Diagnosis Date  . Allergy     rhinitis  . Asthma   . Headache(784.0)   . Anemia   . History of stomach ulcers    BP 119/81 mmHg  Pulse 73  Resp 14  SpO2 98%  LMP 05/17/2012  Opioid Risk Score:   Fall Risk Score:  `1  Depression screen PHQ 2/9  Depression screen [facility] 2/9 11/28/2015 08/08/2015 08/25/2014  Decreased Interest 0 3 0  Down, Depressed, Hopeless 0 0 0  PHQ - 2 Score 0 3 0  Altered sleeping - 1 -  Tired, decreased energy - 1 -  Change in appetite - 0 -  Feeling bad or failure about yourself  - 0 -  Trouble concentrating - 0 -  Moving slowly or fidgety/restless - 3 -  Suicidal thoughts - 0 -  PHQ-9 Score - 8 -  Difficult doing work/chores - Very difficult -    Review of Systems  Constitutional: Positive for diaphoresis and unexpected  weight change.  Respiratory: Positive for wheezing.   Gastrointestinal: Positive for constipation.  Musculoskeletal: Positive for gait problem.  Skin: Positive for rash.  Neurological: Positive for weakness.       Spasms  All other systems reviewed and are negative.      Objective:   Physical Exam  Constitutional: She is oriented to person, place, and time.  Neurological: She is alert and oriented to person, place, and time. No sensory deficit.  Reflex Scores:      Patellar reflexes are 2+ on the right side and 2+ on the left side.      Achilles reflexes are 2+ on the right side and 2+ on the left side.  Motor strength is 5/5 in the right hip flexor and knee extensor and ankle dorsiflexor. Tenderness to palpation over the right greater trochanter. Light touch sensation is equal bilateral anterior thigh.      Assessment & Plan:   1. Fibromyalgia syndrome with myofascial pain.  Doing well with widespread body pain on a combination of tramadol 50 mg 3 times a day as well as nortriptyline 10 mg daily at bedtime We'll continue current dosing  2. Insomnia related to #1, improved with nortriptyline 10 mg daily at bedtime  3. Right greater trochanteric bursitis. appears to be posterior facet mainly which would implicate gluteus medius and maximus. Would do ultrasound guided injection targeting this We'll give exercises and stretching to perform over the next month.

## 2015-12-19 NOTE — Patient Instructions (Signed)
Trochanteric Bursitis You have hip pain due to trochanteric bursitis. Bursitis means that the sack near the outside of the hip is filled with fluid and inflamed. This sack is made up of protective soft tissue. The pain from trochanteric bursitis can be severe and keep you from sleep. It can radiate to the buttocks or down the outside of the thigh to the knee. The pain is almost always worse when rising from the seated or lying position and with walking. Pain can improve after you take a few steps. It happens more often in people with hip joint and lumbar spine problems, such as arthritis or previous surgery. Very rarely the trochanteric bursa can become infected, and antibiotics and/or surgery may be needed. Treatment often includes an injection of local anesthetic mixed with cortisone medicine. This medicine is injected into the area where it is most tender over the hip. Repeat injections may be necessary if the response to treatment is slow. You can apply ice packs over the tender area for 30 minutes every 2 hours for the next few days. Anti-inflammatory and/or narcotic pain medicine may also be helpful. Limit your activity for the next few days if the pain continues. See your caregiver in 5-10 days if you are not greatly improved.  SEEK IMMEDIATE MEDICAL CARE IF:  You develop severe pain, fever, or increased redness.  You have pain that radiates below the knee. EXERCISES STRETCHING EXERCISES - Trochanteric Bursitis  These exercises may help you when beginning to rehabilitate your injury. Your symptoms may resolve with or without further involvement from your physician, physical therapist, or athletic trainer. While completing these exercises, remember:   Restoring tissue flexibility helps normal motion to return to the joints. This allows healthier, less painful movement and activity.  An effective stretch should be held for at least 30 seconds.  A stretch should never be painful. You should only  feel a gentle lengthening or release in the stretched tissue. STRETCH - Iliotibial Band  On the floor or bed, lie on your side so your injured leg is on top. Bend your knee and grab your ankle.  Slowly bring your knee back so that your thigh is in line with your trunk. Keep your heel at your buttocks and gently arch your back so your head, shoulders and hips line up.  Slowly lower your leg so that your knee approaches the floor/bed until you feel a gentle stretch on the outside of your thigh. If you do not feel a stretch and your knee will not fall farther, place the heel of your opposite foot on top of your knee and pull your thigh down farther.  Hold this stretch for __________ seconds.  Repeat __________ times. Complete this exercise __________ times per day. STRETCH - Hamstrings, Supine   Lie on your back. Loop a belt or towel over the ball of your foot as shown.  Straighten your knee and slowly pull on the belt to raise your injured leg. Do not allow the knee to bend. Keep your opposite leg flat on the floor.  Raise the leg until you feel a gentle stretch behind your knee or thigh. Hold this position for __________ seconds.  Repeat __________ times. Complete this stretch __________ times per day. STRETCH - Quadriceps, Prone   Lie on your stomach on a firm surface, such as a bed or padded floor.  Bend your knee and grasp your ankle. If you are unable to reach your ankle or pant leg, use a belt   around your foot to lengthen your reach.  Gently pull your heel toward your buttocks. Your knee should not slide out to the side. You should feel a stretch in the front of your thigh and/or knee.  Hold this position for __________ seconds.  Repeat __________ times. Complete this stretch __________ times per day. STRETCHING - Hip Flexors, Lunge Half kneel with your knee on the floor and your opposite knee bent and directly over your ankle.  Keep good posture with your head over your  shoulders. Tighten your buttocks to point your tailbone downward; this will prevent your back from arching too much.  You should feel a gentle stretch in the front of your thigh and/or hip. If you do not feel any resistance, slightly slide your opposite foot forward and then slowly lunge forward so your knee once again lines up over your ankle. Be sure your tailbone remains pointed downward.  Hold this stretch for __________ seconds.  Repeat __________ times. Complete this stretch __________ times per day. STRETCH - Adductors, Lunge  While standing, spread your legs.  Lean away from your injured leg by bending your opposite knee. You may rest your hands on your thigh for balance.  You should feel a stretch in your inner thigh. Hold for __________ seconds.  Repeat __________ times. Complete this exercise __________ times per day.   This information is not intended to replace advice given to you by your health care provider. Make sure you discuss any questions you have with your health care provider.   Document Released: 12/02/2004 Document Revised: 03/11/2015 Document Reviewed: 02/06/2009 Elsevier Interactive Patient Education 2016 Elsevier Inc[employer]  

## 2016-01-12 ENCOUNTER — Encounter: Payer: Commercial Managed Care - HMO | Attending: Physical Medicine & Rehabilitation

## 2016-01-12 ENCOUNTER — Encounter: Payer: Self-pay | Admitting: Physical Medicine & Rehabilitation

## 2016-01-12 ENCOUNTER — Ambulatory Visit (HOSPITAL_BASED_OUTPATIENT_CLINIC_OR_DEPARTMENT_OTHER): Payer: Commercial Managed Care - HMO | Admitting: Physical Medicine & Rehabilitation

## 2016-01-12 VITALS — BP 126/75 | HR 78 | Resp 14

## 2016-01-12 DIAGNOSIS — M7072 Other bursitis of hip, left hip: Secondary | ICD-10-CM | POA: Insufficient documentation

## 2016-01-12 DIAGNOSIS — J45909 Unspecified asthma, uncomplicated: Secondary | ICD-10-CM | POA: Insufficient documentation

## 2016-01-12 DIAGNOSIS — Z8711 Personal history of peptic ulcer disease: Secondary | ICD-10-CM | POA: Insufficient documentation

## 2016-01-12 DIAGNOSIS — G894 Chronic pain syndrome: Secondary | ICD-10-CM | POA: Diagnosis present

## 2016-01-12 DIAGNOSIS — Z5181 Encounter for therapeutic drug level monitoring: Secondary | ICD-10-CM | POA: Diagnosis not present

## 2016-01-12 DIAGNOSIS — M7071 Other bursitis of hip, right hip: Secondary | ICD-10-CM | POA: Diagnosis not present

## 2016-01-12 DIAGNOSIS — D649 Anemia, unspecified: Secondary | ICD-10-CM | POA: Diagnosis not present

## 2016-01-12 DIAGNOSIS — M797 Fibromyalgia: Secondary | ICD-10-CM | POA: Insufficient documentation

## 2016-01-12 DIAGNOSIS — M1288 Other specific arthropathies, not elsewhere classified, other specified site: Secondary | ICD-10-CM | POA: Insufficient documentation

## 2016-01-12 DIAGNOSIS — M7061 Trochanteric bursitis, right hip: Secondary | ICD-10-CM | POA: Diagnosis not present

## 2016-01-12 DIAGNOSIS — Z79899 Other long term (current) drug therapy: Secondary | ICD-10-CM | POA: Insufficient documentation

## 2016-01-12 DIAGNOSIS — M47816 Spondylosis without myelopathy or radiculopathy, lumbar region: Secondary | ICD-10-CM | POA: Insufficient documentation

## 2016-01-12 NOTE — Patient Instructions (Signed)
Hip Bursitis Bursitis is a swelling and soreness (inflammation) of a fluid-filled sac (bursa). This sac overlies and protects the joints.  CAUSES   Injury.  Overuse of the muscles surrounding the joint.  Arthritis.  Gout.  Infection.  Cold weather.  Inadequate warm-up and conditioning prior to activities. The cause may not be known.  SYMPTOMS   Mild to severe irritation.  Tenderness and swelling over the outside of the hip.  Pain with motion of the hip.  If the bursa becomes infected, a fever may be present. Redness, tenderness, and warmth will develop over the hip. Symptoms usually lessen in 3 to 4 weeks with treatment, but can come back. TREATMENT If conservative treatment does not work, your caregiver may advise draining the bursa and injecting cortisone into the area. This may speed up the healing process. This may also be used as an initial treatment of choice. HOME CARE INSTRUCTIONS   Apply ice to the affected area for 15-20 minutes every 3 to 4 hours while awake for the first 2 days. Put the ice in a plastic bag and place a towel between the bag of ice and your skin.  Rest the painful joint as much as possible, but continue to put the joint through a normal range of motion at least 4 times per day. When the pain lessens, begin normal, slow movements and usual activities to help prevent stiffness of the hip.  Only take over-the-counter or prescription medicines for pain, discomfort, or fever as directed by your caregiver.  Use crutches to limit weight bearing on the hip joint, if advised.  Elevate your painful hip to reduce swelling. Use pillows for propping and cushioning your legs and hips.  Gentle massage may provide comfort and decrease swelling. SEEK IMMEDIATE MEDICAL CARE IF:   Your pain increases even during treatment, or you are not improving.  You have a fever.  You have heat and inflammation over the involved bursa.  You have any other questions or  concerns. MAKE SURE YOU:   Understand these instructions.  Will watch your condition.  Will get help right away if you are not doing well or get worse.   This information is not intended to replace advice given to you by your health care provider. Make sure you discuss any questions you have with your health care provider.   Document Released: 04/16/2002 Document Revised: 01/17/2012 Document Reviewed: 05/27/2015 Elsevier Interactive Patient Education 2016 Elsevier Inc.  

## 2016-01-12 NOTE — Progress Notes (Signed)
Right Trochanteric bursa injection With ultrasound guidance  Indication Trochanteric bursitis. Exam has tenderness over the greater trochanter of the hip. Pain has not responded to conservative care such as exercise therapy and oral medications. Pain interferes with sleep or with mobility 4 Hz curvilinear transducer utilized Informed consent was obtained after describing risks and benefits of the procedure with the patient these include bleeding bruising and infection. Patient has signed written consent form. Patient placed in a lateral decubitus position with the affected hip superior. Point of maximal pain was palpated marked and prepped with Betadine and entered with a needle Under direct ultrasound visualization long axis view Needle slightly withdrawn then 6mg  of betamethasone with 4 cc 1% lidocaine were injected. Patient tolerated procedure well. Post procedure instructions given.

## 2016-01-16 ENCOUNTER — Ambulatory Visit (AMBULATORY_SURGERY_CENTER): Payer: Self-pay | Admitting: *Deleted

## 2016-01-16 VITALS — Ht 61.5 in | Wt 241.0 lb

## 2016-01-16 DIAGNOSIS — Z1211 Encounter for screening for malignant neoplasm of colon: Secondary | ICD-10-CM

## 2016-01-16 MED ORDER — NA SULFATE-K SULFATE-MG SULF 17.5-3.13-1.6 GM/177ML PO SOLN: 1.0000 | Freq: Once | ORAL | Status: DC

## 2016-01-16 NOTE — Progress Notes (Signed)
No egg or soy allergy. No anesthesia problems.  No home O2.  No diet meds.  Emmi given.   

## 2016-01-19 ENCOUNTER — Telehealth: Payer: Self-pay | Admitting: *Deleted

## 2016-01-19 NOTE — Telephone Encounter (Signed)
We can order physical therapy if she would like

## 2016-01-19 NOTE — Telephone Encounter (Signed)
[name] called and says that her hip injection from 01/12/16 isnt working.  She is asking what she can do next? She is in a lot of pain.

## 2016-01-20 NOTE — Telephone Encounter (Signed)
I spoke with [name]. She wants to think about PT.  She said prior times it didn't help and made her hurt worse. (I think she is wanting something stronger for pain)

## 2016-01-20 NOTE — Telephone Encounter (Signed)
Okay She can let us know and we can order PT

## 2016-01-28 ENCOUNTER — Telehealth: Payer: Self-pay | Admitting: Internal Medicine

## 2016-01-28 ENCOUNTER — Other Ambulatory Visit: Payer: Self-pay

## 2016-01-28 DIAGNOSIS — Z1211 Encounter for screening for malignant neoplasm of colon: Secondary | ICD-10-CM

## 2016-01-28 MED ORDER — NA SULFATE-K SULFATE-MG SULF 17.5-3.13-1.6 GM/177ML PO SOLN: 1.0000 | Freq: Once | ORAL | Status: DC

## 2016-01-28 NOTE — Addendum Note (Signed)
Addended by: Kasandra Knudsen on: 01/28/2016 10:00 AM   Modules accepted: Orders

## 2016-01-28 NOTE — Telephone Encounter (Signed)
Pt stated she couldn't pay $50.00 for prep. Advised pt will have a sample of Suprep on 4th floor at desk waiting for her to pick up. Pt undrstd and will pick up prep by 5:00pm today.

## 2016-01-28 NOTE — Telephone Encounter (Signed)
Noted sample prep info

## 2016-01-30 ENCOUNTER — Encounter: Payer: Self-pay | Admitting: Internal Medicine

## 2016-01-30 ENCOUNTER — Ambulatory Visit (AMBULATORY_SURGERY_CENTER): Payer: Commercial Managed Care - HMO | Admitting: Internal Medicine

## 2016-01-30 VITALS — BP 123/73 | HR 73 | Temp 96.9°F | Resp 16 | Ht 61.0 in | Wt 241.0 lb

## 2016-01-30 DIAGNOSIS — Z1211 Encounter for screening for malignant neoplasm of colon: Secondary | ICD-10-CM

## 2016-01-30 DIAGNOSIS — D122 Benign neoplasm of ascending colon: Secondary | ICD-10-CM

## 2016-01-30 DIAGNOSIS — K635 Polyp of colon: Secondary | ICD-10-CM

## 2016-01-30 MED ORDER — SODIUM CHLORIDE 0.9 % IV SOLN: 500.0000 mL | INTRAVENOUS | Status: DC

## 2016-01-30 NOTE — Patient Instructions (Signed)
YOU HAD AN ENDOSCOPIC PROCEDURE TODAY AT THE [facility]:   Refer to the procedure report that was given to you for any specific questions about what was found during the examination.  If the procedure report does not answer your questions, please call your gastroenterologist to clarify.  If you requested that your care partner not be given the details of your procedure findings, then the procedure report has been included in a sealed envelope for you to review at your convenience later.  YOU SHOULD EXPECT: Some feelings of bloating in the abdomen. Passage of more gas than usual.  Walking can help get rid of the air that was put into your GI tract during the procedure and reduce the bloating. If you had a lower endoscopy (such as a colonoscopy or flexible sigmoidoscopy) you may notice spotting of blood in your stool or on the toilet paper. If you underwent a bowel prep for your procedure, you may not have a normal bowel movement for a few days.  Please Note:  You might notice some irritation and congestion in your nose or some drainage.  This is from the oxygen used during your procedure.  There is no need for concern and it should clear up in a day or so.  SYMPTOMS TO REPORT IMMEDIATELY:   Following lower endoscopy (colonoscopy or flexible sigmoidoscopy):  Excessive amounts of blood in the stool  Significant tenderness or worsening of abdominal pains  Swelling of the abdomen that is new, acute  Fever of 100F or higher    For urgent or emergent issues, a gastroenterologist can be reached at any hour by calling [phone].   DIET: Your first meal following the procedure should be a small meal and then it is ok to progress to your normal diet. Heavy or fried foods are harder to digest and may make you feel nauseous or bloated.  Likewise, meals heavy in dairy and vegetables can increase bloating.  Drink plenty of fluids but you should avoid alcoholic beverages for 24  hours.  ACTIVITY:  You should plan to take it easy for the rest of today and you should NOT DRIVE or use heavy machinery until tomorrow (because of the sedation medicines used during the test).    FOLLOW UP: Our staff will call the number listed on your records the next business day following your procedure to check on you and address any questions or concerns that you may have regarding the information given to you following your procedure. If we do not reach you, we will leave a message.  However, if you are feeling well and you are not experiencing any problems, there is no need to return our call.  We will assume that you have returned to your regular daily activities without incident.  If any biopsies were taken you will be contacted by phone or by letter within the next 1-3 weeks.  Please call us at [phone] if you have not heard about the biopsies in 3 weeks.    SIGNATURES/CONFIDENTIALITY: You and/or your care partner have signed paperwork which will be entered into your electronic medical record.  These signatures attest to the fact that that the information above on your After Visit Summary has been reviewed and is understood.  Full responsibility of the confidentiality of this discharge information lies with you and/or your care-partner   Information on polyps and hemorrhoids given to you today        

## 2016-01-30 NOTE — Op Note (Signed)
[facility] Patient Name: [name] Procedure Date: 01/30/2016 9:25 AM MRN: [id number] Endoscopist: Jerene Bears , MD Age: 55 Referring MD:  Date of Birth: 05-Jun-1961 Gender: Female Procedure:                Colonoscopy Indications:              Screening for colorectal malignant neoplasm, This                            is the patient's first colonoscopy Medicines:                Monitored Anesthesia Care Procedure:                Pre-Anesthesia Assessment:                           - Prior to the procedure, a History and Physical                            was performed, and patient medications and                            allergies were reviewed. The patient's tolerance of                            previous anesthesia was also reviewed. The risks                            and benefits of the procedure and the sedation                            options and risks were discussed with the patient.                            All questions were answered, and informed consent                            was obtained. Prior Anticoagulants: The patient has                            taken no previous anticoagulant or antiplatelet                            agents. ASA Grade Assessment: III - A patient with                            severe systemic disease. After reviewing the risks                            and benefits, the patient was deemed in                            satisfactory condition to undergo the procedure.  After obtaining informed consent, the colonoscope                            was passed under direct vision. Throughout the                            procedure, the patient's blood pressure, pulse, and                            oxygen saturations were monitored continuously. The                            Model CF-HQ190L [id number]) scope was introduced                            through the anus and advanced to the the  cecum,                            identified by appendiceal orifice and ileocecal                            valve. The colonoscopy was performed without                            difficulty. The patient tolerated the procedure                            well. The quality of the bowel preparation was                            good. The ileocecal valve, appendiceal orifice, and                            rectum were photographed. The bowel preparation                            used was SUPREP. Scope In: 9:36:31 AM Scope Out: 9:47:54 AM Scope Withdrawal Time: 0 hours 8 minutes 44 seconds  Total Procedure Duration: 0 hours 11 minutes 23 seconds  Findings:      The digital rectal exam was normal.      A 4 mm polyp was found in the ascending colon. The polyp was sessile.       The polyp was removed with a cold snare. Resection and retrieval were       complete.      Internal hemorrhoids were found during retroflexion. The hemorrhoids       were small. Complications:            No immediate complications. Estimated Blood Loss:     Estimated blood loss: none. Impression:               - One 4 mm polyp in the ascending colon, removed                            with a cold snare. Resected and retrieved.                           -  Small internal hemorrhoids. Recommendation:           - Patient has a contact number available for                            emergencies. The signs and symptoms of potential                            delayed complications were discussed with the                            patient. Return to normal activities tomorrow.                            Written discharge instructions were provided to the                            patient.                           - Resume previous diet.                           - Continue present medications.                           - Await pathology results.                           - Repeat colonoscopy is recommended for                             surveillance. The colonoscopy date will be                            determined after pathology results from today's                            exam become available for review. Procedure Code(s):        --- Professional ---                           [phone], Colonoscopy, flexible; with removal of                            tumor(s), polyp(s), or other lesion(s) by snare                            technique CPT copyright 2016 [employer]. All rights reserved. Lajuan Lines. Hilarie Fredrickson, MD Jerene Bears, MD 01/30/2016 9:50:49 AM This report has been signed electronically. Number of Addenda: 0 Referring MD:      [name], MD

## 2016-01-30 NOTE — Progress Notes (Signed)
Called to room to assist during endoscopic procedure.  Patient ID and intended procedure confirmed with present staff. Received instructions for my participation in the procedure from the performing physician.  

## 2016-01-30 NOTE — Progress Notes (Signed)
Report to PACU, RN, vss, BBS= Clear.  

## 2016-02-02 ENCOUNTER — Telehealth: Payer: Self-pay | Admitting: *Deleted

## 2016-02-02 NOTE — Telephone Encounter (Signed)
  Follow up Call-  Call back number 01/30/2016  Post procedure Call Back phone  # [phone]  Permission to leave phone message Yes     Patient questions:  Do you have a fever, pain , or abdominal swelling? No. Pain Score  0 *  Have you tolerated food without any problems? Yes.    Have you been able to return to your normal activities? Yes.    Do you have any questions about your discharge instructions: Diet   No. Medications  No. Follow up visit  No.  Do you have questions or concerns about your Care? No.  Actions: * If pain score is 4 or above: No action needed, pain <4.

## 2016-02-03 ENCOUNTER — Encounter: Payer: Self-pay | Admitting: Internal Medicine

## 2016-02-09 ENCOUNTER — Ambulatory Visit (HOSPITAL_BASED_OUTPATIENT_CLINIC_OR_DEPARTMENT_OTHER): Payer: Commercial Managed Care - HMO | Admitting: Physical Medicine & Rehabilitation

## 2016-02-09 ENCOUNTER — Encounter: Payer: Commercial Managed Care - HMO | Attending: Physical Medicine & Rehabilitation

## 2016-02-09 ENCOUNTER — Encounter: Payer: Self-pay | Admitting: Physical Medicine & Rehabilitation

## 2016-02-09 VITALS — BP 128/70 | HR 64 | Resp 14

## 2016-02-09 DIAGNOSIS — D649 Anemia, unspecified: Secondary | ICD-10-CM | POA: Insufficient documentation

## 2016-02-09 DIAGNOSIS — M797 Fibromyalgia: Secondary | ICD-10-CM | POA: Diagnosis not present

## 2016-02-09 DIAGNOSIS — J45909 Unspecified asthma, uncomplicated: Secondary | ICD-10-CM | POA: Diagnosis not present

## 2016-02-09 DIAGNOSIS — Z5181 Encounter for therapeutic drug level monitoring: Secondary | ICD-10-CM | POA: Diagnosis not present

## 2016-02-09 DIAGNOSIS — M25561 Pain in right knee: Secondary | ICD-10-CM | POA: Diagnosis not present

## 2016-02-09 DIAGNOSIS — Z79899 Other long term (current) drug therapy: Secondary | ICD-10-CM | POA: Insufficient documentation

## 2016-02-09 DIAGNOSIS — M47816 Spondylosis without myelopathy or radiculopathy, lumbar region: Secondary | ICD-10-CM

## 2016-02-09 DIAGNOSIS — G894 Chronic pain syndrome: Secondary | ICD-10-CM | POA: Diagnosis present

## 2016-02-09 DIAGNOSIS — M7072 Other bursitis of hip, left hip: Secondary | ICD-10-CM | POA: Insufficient documentation

## 2016-02-09 DIAGNOSIS — Z8711 Personal history of peptic ulcer disease: Secondary | ICD-10-CM | POA: Diagnosis not present

## 2016-02-09 DIAGNOSIS — M7062 Trochanteric bursitis, left hip: Secondary | ICD-10-CM | POA: Insufficient documentation

## 2016-02-09 DIAGNOSIS — M7071 Other bursitis of hip, right hip: Secondary | ICD-10-CM | POA: Diagnosis not present

## 2016-02-09 DIAGNOSIS — M1288 Other specific arthropathies, not elsewhere classified, other specified site: Secondary | ICD-10-CM | POA: Diagnosis not present

## 2016-02-09 DIAGNOSIS — M7061 Trochanteric bursitis, right hip: Secondary | ICD-10-CM | POA: Diagnosis not present

## 2016-02-09 MED ORDER — NORTRIPTYLINE HCL 25 MG PO CAPS: 25.0000 mg | ORAL_CAPSULE | Freq: Every day | ORAL | Status: DC

## 2016-02-09 NOTE — Progress Notes (Signed)
Subjective:    Patient ID: [name], female    DOB: Apr 02, 1961, 55 y.o.   MRN: [id number]  HPI Left greater than right hip pain Underwent right hip trochanteric bursa injection, posterior facet approximately 4 weeks ago, Having more pain in the left hip currently. She also has some right knee pain. Pain Inventory Average Pain 10 Pain Right Now 7 My pain is sharp and aching  In the last 24 hours, has pain interfered with the following? General activity 6 Relation with others 6 Enjoyment of life 10 What TIME of day is your pain at its worst? morning, night Sleep (in general) Fair  Pain is worse with: walking, sitting, standing and some activites Pain improves with: medication Relief from Meds: 5  Mobility walk without assistance walk with assistance how many minutes can you walk? 10 minutes ability to climb steps?  yes do you drive?  no Do you have any goals in this area?  yes  Function not employed: date last employed 08/2015 Do you have any goals in this area?  yes  Neuro/Psych bowel control problems trouble walking spasms  Prior Studies Any changes since last visit?  yes  Physicians involved in your care Any changes since last visit?  yes Primary care .   Family History  Problem Relation Age of Onset  . Arthritis Other   . Diabetes Other   . Hypertension Other   . Breast cancer Other   . Hypertension Mother   . Diabetes Sister   . Cancer Neg Hx   . Early death Neg Hx   . Hearing loss Neg Hx   . Heart disease Neg Hx   . Hyperlipidemia Neg Hx   . Kidney disease Neg Hx   . Stroke Neg Hx   . Colon cancer Neg Hx   . Hypertension Sister    Social History   Social History  . Marital Status: Married    Spouse Name: N/A  . Number of Children: N/A  . Years of Education: N/A   Social History Main Topics  . Smoking status: Never Smoker   . Smokeless tobacco: Never Used  . Alcohol Use: No  . Drug Use: No  . Sexual Activity: Yes    Birth  Control/ Protection: Post-menopausal, Surgical   Other Topics Concern  . None   Social History Narrative   Past Surgical History  Procedure Laterality Date  . Tubal ligation    . Carpal tunnel release Right    Past Medical History  Diagnosis Date  . Allergy     rhinitis  . Asthma   . Headache(784.0)   . Anemia   . History of stomach ulcers   . Arthritis    BP 128/70 mmHg  Pulse 64  Resp 14  SpO2 98%  LMP 05/17/2012  Opioid Risk Score:   Fall Risk Score:  `1  Depression screen PHQ 2/9  Depression screen [facility] 2/9 11/28/2015 08/08/2015 08/25/2014  Decreased Interest 0 3 0  Down, Depressed, Hopeless 0 0 0  PHQ - 2 Score 0 3 0  Altered sleeping - 1 -  Tired, decreased energy - 1 -  Change in appetite - 0 -  Feeling bad or failure about yourself  - 0 -  Trouble concentrating - 0 -  Moving slowly or fidgety/restless - 3 -  Suicidal thoughts - 0 -  PHQ-9 Score - 8 -  Difficult doing work/chores - Very difficult -     Review of Systems  Constitutional: Positive for appetite change.       Bowel control problems   Gastrointestinal: Positive for nausea and constipation.  Musculoskeletal: Positive for gait problem.  Neurological:       Spasms   All other systems reviewed and are negative.      Objective:   Physical Exam  Constitutional: She is oriented to person, place, and time. She appears well-developed and well-nourished.  HENT:  Head: Normocephalic and atraumatic.  Eyes: Conjunctivae and EOM are normal. Pupils are equal, round, and reactive to light.  Neck: Normal range of motion.  Musculoskeletal:       Right hip: She exhibits tenderness. She exhibits no bony tenderness.       Left hip: She exhibits tenderness and bony tenderness.       Right knee: She exhibits deformity. She exhibits normal range of motion, no swelling, no effusion and no ecchymosis. Tenderness found. Medial joint line, lateral joint line and patellar tendon tenderness noted.       Lumbar  back: She exhibits decreased range of motion, tenderness and deformity.  Lordotic deformity in the spine No evidence of scoliosis, Tenderness palpation lumbar paraspinal muscles Tenderness palpation over bilateral greater trochanters however worse on the left compared to the right side.  Mild bilateral valgus deformities at the knees  Neurological: She is alert and oriented to person, place, and time.  Psychiatric: She has a normal mood and affect.  Nursing note and vitals reviewed.         Assessment & Plan:  1. Left trochanteric bursitis this is her primary complaint. She had good relief with right ultrasound-guided trochanteric bursa injection but not with palpation guided. Will schedule for left ultrasound-guided Trochanteric bursa injection. 2.Fibromyalgia syndromeWith sleep disturbance and overall body aching. Symptom amplification. We will increase Pamelor to 25 mg per night 3. Lumbar spondylosis she does have paraspinal lumbar tenderness however some of this may be her fibromyalgia. We'll see how she does with the increased dose of nortriptyline. Other treatments may include medial branch blocks

## 2016-02-09 NOTE — Patient Instructions (Signed)
We will inject the left hip next visit  We will increase nortriptyline from 10 mg to 25 mg this should help improve sleep as well as decreased sensitivity to pain

## 2016-02-11 ENCOUNTER — Telehealth: Payer: Self-pay | Admitting: *Deleted

## 2016-02-11 NOTE — Telephone Encounter (Signed)
[name] called because she is in a lot of pain. She was tearful and saying she is in so much pain right now.  Nothing new has happened, it is the same pain she has had with her hip.  She is taking tramadol from Dr Alona Bene but it is not helping. I told her I would forward to Dr Letta Pate but until he replies I suggested she try ice compress on the area.  She is scheduled 02/23/16 for hip injection. Last one done 01/12/16.  Please advise

## 2016-02-12 NOTE — Telephone Encounter (Signed)
Pt advised.

## 2016-02-12 NOTE — Telephone Encounter (Signed)
I would recommend Aspercreme with lidocaine apply 2-3 times per day

## 2016-02-13 ENCOUNTER — Other Ambulatory Visit: Payer: Self-pay | Admitting: *Deleted

## 2016-02-13 MED ORDER — NORTRIPTYLINE HCL 25 MG PO CAPS: 25.0000 mg | ORAL_CAPSULE | Freq: Every day | ORAL | Status: DC

## 2016-02-20 ENCOUNTER — Telehealth: Payer: Self-pay | Admitting: Internal Medicine

## 2016-02-20 ENCOUNTER — Emergency Department (HOSPITAL_COMMUNITY)
Admission: EM | Admit: 2016-02-20 | Discharge: 2016-02-20 | Disposition: A | Discharge status: Home / Self Care | Payer: Commercial Managed Care - HMO | Attending: Emergency Medicine | Admitting: Emergency Medicine

## 2016-02-20 ENCOUNTER — Encounter (HOSPITAL_COMMUNITY): Payer: Self-pay | Admitting: Emergency Medicine

## 2016-02-20 DIAGNOSIS — Z862 Personal history of diseases of the blood and blood-forming organs and certain disorders involving the immune mechanism: Secondary | ICD-10-CM | POA: Insufficient documentation

## 2016-02-20 DIAGNOSIS — M1990 Unspecified osteoarthritis, unspecified site: Secondary | ICD-10-CM | POA: Diagnosis not present

## 2016-02-20 DIAGNOSIS — M7061 Trochanteric bursitis, right hip: Secondary | ICD-10-CM | POA: Insufficient documentation

## 2016-02-20 DIAGNOSIS — Z79899 Other long term (current) drug therapy: Secondary | ICD-10-CM | POA: Insufficient documentation

## 2016-02-20 DIAGNOSIS — Z7952 Long term (current) use of systemic steroids: Secondary | ICD-10-CM | POA: Insufficient documentation

## 2016-02-20 DIAGNOSIS — M25551 Pain in right hip: Secondary | ICD-10-CM | POA: Diagnosis present

## 2016-02-20 DIAGNOSIS — Z7951 Long term (current) use of inhaled steroids: Secondary | ICD-10-CM | POA: Insufficient documentation

## 2016-02-20 DIAGNOSIS — Y9389 Activity, other specified: Secondary | ICD-10-CM | POA: Diagnosis not present

## 2016-02-20 DIAGNOSIS — Z8719 Personal history of other diseases of the digestive system: Secondary | ICD-10-CM | POA: Insufficient documentation

## 2016-02-20 DIAGNOSIS — J45909 Unspecified asthma, uncomplicated: Secondary | ICD-10-CM | POA: Diagnosis not present

## 2016-02-20 MED ORDER — KETOROLAC TROMETHAMINE 30 MG/ML IJ SOLN
30.0000 mg | Freq: Once | INTRAMUSCULAR | Status: AC
  Administered 2016-02-20: 30 mg via INTRAMUSCULAR
  Filled 2016-02-20: qty 1

## 2016-02-20 NOTE — Discharge Instructions (Signed)
Bursitis °Bursitis is inflammation and irritation of a bursa, which is one of the small, fluid-filled sacs that cushion and protect the moving parts of your body. These sacs are located between bones and muscles, muscle attachments, or skin areas next to bones. A bursa protects these structures from the wear and tear that results from frequent movement. °An inflamed bursa causes pain and swelling. Fluid may build up inside the sac. Bursitis is most common near joints, especially the knees, elbows, hips, and shoulders. °CAUSES °Bursitis can be caused by:  °· Injury from: °¨ A direct blow, like falling on your knee or elbow. °¨ Overuse of a joint (repetitive stress). °· Infection. This can happen if bacteria gets into a bursa through a cut or scrape near a joint. °· Diseases that cause joint inflammation, such as gout and rheumatoid arthritis. °RISK FACTORS °You may be at risk for bursitis if you:  °· Have a job or hobby that involves a lot of repetitive stress on your joints. °· Have a condition that weakens your body's defense system (immune system), such as diabetes, cancer, or HIV. °· Lift and reach overhead often. °· Kneel or lean on hard surfaces often. °· Run or walk often. °SIGNS AND SYMPTOMS °The most common signs and symptoms of bursitis are: °· Pain that gets worse when you move the affected body part or put weight on it. °· Inflammation. °· Stiffness. °Other signs and symptoms may include: °· Redness. °· Tenderness. °· Warmth. °· Pain that continues after rest. °· Fever and chills. This may occur in bursitis caused by infection. °DIAGNOSIS °Bursitis may be diagnosed by:  °· Medical history and physical exam. °· MRI. °· A procedure to drain fluid from the bursa with a needle (aspiration). The fluid may be checked for signs of infection or gout. °· Blood tests to rule out other causes of inflammation. °TREATMENT  °Bursitis can usually be treated at home with rest, ice, compression, and elevation (RICE). For  mild bursitis, RICE treatment may be all you need. Other treatments may include: °· Nonsteroidal anti-inflammatory drugs (NSAIDs) to treat pain and inflammation. °· Corticosteroids to fight inflammation. You may have these drugs injected into and around the area of bursitis. °· Aspiration of bursitis fluid to relieve pain and improve movement. °· Antibiotic medicine to treat an infected bursa. °· A splint, brace, or walking aid. °· Physical therapy if you continue to have pain or limited movement. °· Surgery to remove a damaged or infected bursa. This may be needed if you have a very bad case of bursitis or if other treatments have not worked. °HOME CARE INSTRUCTIONS  °· Take medicines only as directed by your health care provider. °· If you were prescribed an antibiotic medicine, finish it all even if you start to feel better. °· Rest the affected area as directed by your health care provider. °¨ Keep the area elevated. °¨ Avoid activities that make pain worse. °· Apply ice to the injured area: °¨ Place ice in a plastic bag. °¨ Place a towel between your skin and the bag. °¨ Leave the ice on for 20 minutes, 2-3 times a day. °· Use splints, braces, pads, or walking aids as directed by your health care provider. °· Keep all follow-up visits as directed by your health care provider. This is important. °PREVENTION  °· Wear knee pads if you kneel often. °· Wear sturdy running or walking shoes that fit you well. °· Take regular breaks from repetitive activity. °· Warm   up by stretching before doing any strenuous activity. °· Maintain a healthy weight or lose weight as recommended by your health care provider. Ask your health care provider if you need help. °· Exercise regularly. Start any new physical activity gradually. °SEEK MEDICAL CARE IF:  °· Your bursitis is not responding to treatment or home care. °· You have a fever. °· You have chills. °  °This information is not intended to replace advice given to you by your  health care provider. Make sure you discuss any questions you have with your health care provider. °  °Document Released: 10/22/2000 Document Revised: 07/16/2015 Document Reviewed: 01/14/2014 °Elsevier Interactive Patient Education ©2016 [employer] ° °

## 2016-02-20 NOTE — ED Provider Notes (Signed)
CSN: [id number]     Arrival date & time 02/20/16  1142 History   First MD Initiated Contact with Patient 02/20/16 1203     Chief Complaint  Patient presents with  . Leg Pain     (Consider location/radiation/quality/duration/timing/severity/associated sxs/prior Treatment) Patient is a 55 y.o. female presenting with leg pain. The history is provided by the patient.  Leg Pain Location:  Hip Time since incident:  1 day Injury: no   Hip location:  R hip Pain details:    Quality:  Aching   Radiates to:  Does not radiate   Severity:  Moderate   Onset quality:  Gradual   Timing:  Constant   Progression:  Unchanged Chronicity:  Chronic Prior injury to area:  No Relieved by:  Nothing Worsened by:  Nothing tried Ineffective treatments:  None tried Associated symptoms: stiffness   Associated symptoms: no back pain, no decreased ROM, no numbness and no tingling   Risk factors: obesity     Past Medical History  Diagnosis Date  . Allergy     rhinitis  . Asthma   . Headache(784.0)   . Anemia   . History of stomach ulcers   . Arthritis    Past Surgical History  Procedure Laterality Date  . Tubal ligation    . Carpal tunnel release Right    Family History  Problem Relation Age of Onset  . Arthritis Other   . Diabetes Other   . Hypertension Other   . Breast cancer Other   . Hypertension Mother   . Diabetes Sister   . Cancer Neg Hx   . Early death Neg Hx   . Hearing loss Neg Hx   . Heart disease Neg Hx   . Hyperlipidemia Neg Hx   . Kidney disease Neg Hx   . Stroke Neg Hx   . Colon cancer Neg Hx   . Hypertension Sister    Social History  Substance Use Topics  . Smoking status: Never Smoker   . Smokeless tobacco: Never Used  . Alcohol Use: No   OB History    No data available     Review of Systems  Musculoskeletal: Positive for stiffness. Negative for back pain.  All other systems reviewed and are negative.     Allergies  Review of patient's allergies  indicates no known allergies.  Home Medications   Prior to Admission medications   Medication Sig Start Date End Date Taking? Authorizing Provider  desonide (DESOWEN) 0.05 % cream Apply topically 2 (two) times daily. 12/04/15   Aleksei Plotnikov V, MD  lubiprostone (AMITIZA) 24 MCG capsule Take 1 capsule (24 mcg total) by mouth 2 (two) times daily with a meal. 11/26/15   [name], MD  mometasone (NASONEX) 50 MCG/ACT nasal spray Place 4 sprays into the nose daily. For allergies 06/01/13   [name], MD  nortriptyline (PAMELOR) 25 MG capsule Take 1 capsule (25 mg total) by mouth at bedtime. Stop trazodone 02/13/16   Charlett Blake, MD  traMADol (ULTRAM) 50 MG tablet Take 1 tablet (50 mg total) by mouth every 6 (six) hours as needed. 12/08/15   [name], MD  triamcinolone cream (KENALOG) 0.5 % Apply topically 3 (three) times daily. 11/26/15   [name], MD   BP 123/85 mmHg  Pulse 73  Temp(Src) 98.2 F (36.8 C) (Oral)  Resp 16  SpO2 99%  LMP 05/17/2012 Physical Exam  Constitutional: She is oriented to person, place, and  time. She appears well-developed and well-nourished. No distress.  HENT:  Head: Normocephalic.  Eyes: Conjunctivae are normal.  Neck: Neck supple. No tracheal deviation present.  Cardiovascular: Normal rate and regular rhythm.   Pulmonary/Chest: Effort normal. No respiratory distress.  Abdominal: Soft. She exhibits no distension.  Musculoskeletal:       Right hip: She exhibits tenderness (lateral right hip). She exhibits normal range of motion, normal strength, no crepitus and no deformity.  Neurological: She is alert and oriented to person, place, and time.  Skin: Skin is warm and dry.  Psychiatric: She has a normal mood and affect.    ED Course  Procedures (including critical care time) Labs Review Labs Reviewed - No data to display  Imaging Review No results found. I have personally reviewed and evaluated these images and lab results as  part of my medical decision-making.   EKG Interpretation None      MDM   Final diagnoses:  Trochanteric bursitis of right hip    55 y.o. female presents with recurrent right hip pain. Sees PM&R for trochanteric bursitis and multiple chronic pain complaints. She is able to ambulate appropriately and this does not appear to be a new symptom for her. Provided dose of IM toradol, recommended NSAIDs short term and early mobility for definitive therapy pending repeat evaluation by her primary team.     Leo Grosser, MD 02/21/16 [phone]

## 2016-02-20 NOTE — ED Notes (Signed)
Got up this am and states rt leg fellt numb  States leg kept going out her yesterday 8/10 pain from rt hip down to ankle have been able to hobble, has feeling in leg now

## 2016-02-20 NOTE — Telephone Encounter (Signed)
I received a call from [name] at 9:10 AM. She was complaining of left hip pain and bilateral knee pain stating that she is having difficulty walking secondary to pain. She is requesting pain medication. She is not a patient in the Internal Medicine Clinic so I told her that I cannot prescribe her any pain medications. I recommended for her to seek care at an urgent care facility or go to the ER given it is a holiday today. She understands and agrees with the plan.   Albin Felling, MD, MPH Internal Medicine Resident, PGY-II Pager: [phone]

## 2016-02-24 ENCOUNTER — Encounter: Payer: Self-pay | Admitting: Physical Medicine & Rehabilitation

## 2016-02-24 ENCOUNTER — Ambulatory Visit (HOSPITAL_BASED_OUTPATIENT_CLINIC_OR_DEPARTMENT_OTHER): Payer: Commercial Managed Care - HMO | Admitting: Physical Medicine & Rehabilitation

## 2016-02-24 DIAGNOSIS — M5126 Other intervertebral disc displacement, lumbar region: Secondary | ICD-10-CM | POA: Diagnosis not present

## 2016-02-24 DIAGNOSIS — M7062 Trochanteric bursitis, left hip: Secondary | ICD-10-CM | POA: Diagnosis not present

## 2016-02-24 DIAGNOSIS — G894 Chronic pain syndrome: Secondary | ICD-10-CM | POA: Diagnosis not present

## 2016-02-24 MED ORDER — TRAMADOL HCL 50 MG PO TABS: 50.0000 mg | ORAL_TABLET | Freq: Four times a day (QID) | ORAL | Status: DC | PRN

## 2016-02-24 NOTE — Progress Notes (Signed)
Subjective:    Patient ID: [name], female    DOB: 09-21-1961, 55 y.o.   MRN: [id number]  HPI 55 year old female with history of lumbar spondylosis as well as bilateral greater trochanteric bursitis as well as fibromyalgia syndrome. She awoke Friday morning with increased pain in her lower extremities. This pain interferes with her ability to walk. She went to the emergency room for evaluation. Patient had no history of recent trauma. No bowel or bladder dysfunction.  She was evaluated in the emergency department treated and released. Nonsteroidals were prescribed Pain Inventory Average Pain 7 Pain Right Now 7 My pain is sharp, burning and aching  In the last 24 hours, has pain interfered with the following? General activity 6 Relation with others 0 Enjoyment of life 0 What TIME of day is your pain at its worst? morning Sleep (in general) Fair  Pain is worse with: walking and bending Pain improves with: pacing activities Relief from Meds: 5  Mobility walk without assistance walk with assistance how many minutes can you walk? 10 ability to climb steps?  no do you drive?  no  Function not employed: date last employed . I need assistance with the following:  dressing and household duties  Neuro/Psych weakness numbness trouble walking  Prior Studies Any changes since last visit?  no  Physicians involved in your care Primary care [name]   Family History  Problem Relation Age of Onset  . Arthritis Other   . Diabetes Other   . Hypertension Other   . Breast cancer Other   . Hypertension Mother   . Diabetes Sister   . Cancer Neg Hx   . Early death Neg Hx   . Hearing loss Neg Hx   . Heart disease Neg Hx   . Hyperlipidemia Neg Hx   . Kidney disease Neg Hx   . Stroke Neg Hx   . Colon cancer Neg Hx   . Hypertension Sister    Social History   Social History  . Marital Status: Married    Spouse Name: N/A  . Number of Children: N/A  . Years of  Education: N/A   Social History Main Topics  . Smoking status: Never Smoker   . Smokeless tobacco: Never Used  . Alcohol Use: No  . Drug Use: No  . Sexual Activity: Yes    Birth Control/ Protection: Post-menopausal, Surgical   Other Topics Concern  . None   Social History Narrative   Past Surgical History  Procedure Laterality Date  . Tubal ligation    . Carpal tunnel release Right    Past Medical History  Diagnosis Date  . Allergy     rhinitis  . Asthma   . Headache(784.0)   . Anemia   . History of stomach ulcers   . Arthritis    LMP 05/17/2012  Opioid Risk Score:   Fall Risk Score:  `1  Depression screen PHQ 2/9  Depression screen [facility] 2/9 11/28/2015 08/08/2015 08/25/2014  Decreased Interest 0 3 0  Down, Depressed, Hopeless 0 0 0  PHQ - 2 Score 0 3 0  Altered sleeping - 1 -  Tired, decreased energy - 1 -  Change in appetite - 0 -  Feeling bad or failure about yourself  - 0 -  Trouble concentrating - 0 -  Moving slowly or fidgety/restless - 3 -  Suicidal thoughts - 0 -  PHQ-9 Score - 8 -  Difficult doing work/chores - Very difficult -  Review of Systems     Objective:   Physical Exam  Constitutional: She is oriented to person, place, and time. She appears well-developed and well-nourished.  HENT:  Head: Normocephalic and atraumatic.  Eyes: Conjunctivae and EOM are normal. Pupils are equal, round, and reactive to light.  Neck: Normal range of motion.  Musculoskeletal:       Right hip: She exhibits bony tenderness. She exhibits no deformity.       Left hip: She exhibits bony tenderness. She exhibits no deformity.  Neurological: She is alert and oriented to person, place, and time.  Psychiatric: She has a normal mood and affect.  Nursing note and vitals reviewed.         Assessment & Plan:  1. Right greater than left hip pain she has bilateral trochanteric bursitis. Her right hip injection was approximately 6 weeks ago and may be wearing off.  It is too early to repeat. In terms of her left hip, will perform ultrasound guided injection today. See patient back in 6 weeks for right hip reinjection under ultrasound guidance. Discussed with patient and agrees with plan   Trochanteric bursa injection With ultrasound guidance  Indication Trochanteric bursitis. Exam has tenderness over the greater trochanter of the hip. Pain has not responded to conservative care such as exercise therapy and oral medications. Pain interferes with sleep or with mobility Informed consent was obtained after describing risks and benefits of the procedure with the patient these include bleeding bruising and infection. Patient has signed written consent form. Patient placed in a lateral decubitus position with the affected hip superior.4 Hz linear transducer was used to identify greater trochanter, skin marked and prepped with Betadine and entered with a 80 mm echo block needle Under direct ultrasound visualization. Needle slightly withdrawn then 6mg  of betamethasone with 4 cc 1% lidocaine were injected. Patient tolerated procedure well. Post procedure instructions given.

## 2016-03-05 ENCOUNTER — Ambulatory Visit: Payer: Commercial Managed Care - HMO | Admitting: Physical Medicine & Rehabilitation

## 2016-03-05 ENCOUNTER — Ambulatory Visit: Payer: Commercial Managed Care - HMO

## 2016-04-06 ENCOUNTER — Ambulatory Visit (HOSPITAL_BASED_OUTPATIENT_CLINIC_OR_DEPARTMENT_OTHER): Payer: Commercial Managed Care - HMO | Admitting: Physical Medicine & Rehabilitation

## 2016-04-06 ENCOUNTER — Encounter: Payer: Commercial Managed Care - HMO | Attending: Physical Medicine & Rehabilitation

## 2016-04-06 ENCOUNTER — Encounter: Payer: Self-pay | Admitting: Physical Medicine & Rehabilitation

## 2016-04-06 VITALS — BP 137/77 | HR 72 | Resp 14

## 2016-04-06 DIAGNOSIS — G894 Chronic pain syndrome: Secondary | ICD-10-CM | POA: Insufficient documentation

## 2016-04-06 DIAGNOSIS — Z8711 Personal history of peptic ulcer disease: Secondary | ICD-10-CM | POA: Insufficient documentation

## 2016-04-06 DIAGNOSIS — M1288 Other specific arthropathies, not elsewhere classified, other specified site: Secondary | ICD-10-CM | POA: Diagnosis not present

## 2016-04-06 DIAGNOSIS — M7072 Other bursitis of hip, left hip: Secondary | ICD-10-CM | POA: Insufficient documentation

## 2016-04-06 DIAGNOSIS — M7061 Trochanteric bursitis, right hip: Secondary | ICD-10-CM

## 2016-04-06 DIAGNOSIS — Z79899 Other long term (current) drug therapy: Secondary | ICD-10-CM | POA: Diagnosis not present

## 2016-04-06 DIAGNOSIS — Z5181 Encounter for therapeutic drug level monitoring: Secondary | ICD-10-CM | POA: Diagnosis not present

## 2016-04-06 DIAGNOSIS — M47816 Spondylosis without myelopathy or radiculopathy, lumbar region: Secondary | ICD-10-CM | POA: Insufficient documentation

## 2016-04-06 DIAGNOSIS — M7071 Other bursitis of hip, right hip: Secondary | ICD-10-CM | POA: Insufficient documentation

## 2016-04-06 DIAGNOSIS — J45909 Unspecified asthma, uncomplicated: Secondary | ICD-10-CM | POA: Insufficient documentation

## 2016-04-06 DIAGNOSIS — D649 Anemia, unspecified: Secondary | ICD-10-CM | POA: Insufficient documentation

## 2016-04-06 DIAGNOSIS — M797 Fibromyalgia: Secondary | ICD-10-CM | POA: Insufficient documentation

## 2016-04-06 NOTE — Patient Instructions (Signed)
We did a right trochanteric bursa injection with ultrasound \\Injected  Celestone and lidocaine

## 2016-04-06 NOTE — Progress Notes (Signed)
Right Trochanteric bursa injection With ultrasound guidance  Indication Trochanteric bursitis. Exam has tenderness over the greater trochanter of the hip. Pain has not responded to conservative care such as exercise therapy and oral medications. Pain interferes with sleep or with mobility 4 Hz curvilinear transducer utilized Informed consent was obtained after describing risks and benefits of the procedure with the patient these include bleeding bruising and infection. Patient has signed written consent form. Patient placed in a lateral decubitus position with the affected hip superior. Point of maximal pain was palpated marked and prepped with Betadine and entered with a needle Under direct ultrasound visualization long axis view Needle slightly withdrawn then 6mg  of betamethasone with 4 cc 1% lidocaine were injected. Patient tolerated procedure well. Post procedure instructions given.

## 2016-04-27 ENCOUNTER — Telehealth: Payer: Self-pay

## 2016-04-27 NOTE — Telephone Encounter (Signed)
This is a bursitis which should respond to anti-inflammatory. She may try Aleve 2 tablets twice a day for 2 weeks take with food Do not advise anything stronger than tramadol for Narcotic

## 2016-04-27 NOTE — Telephone Encounter (Signed)
Pt states she is still having hip pain after her injection. She would like to know if stronger pain medication would be an option to help with this. Please advise.

## 2016-04-28 NOTE — Telephone Encounter (Signed)
Left message and notified to take Aleve with instructions, she is to call back if she has any questions.

## 2016-05-12 ENCOUNTER — Telehealth: Payer: Self-pay | Admitting: *Deleted

## 2016-05-12 DIAGNOSIS — M5126 Other intervertebral disc displacement, lumbar region: Secondary | ICD-10-CM

## 2016-05-12 MED ORDER — TRAMADOL HCL 50 MG PO TABS: 50.0000 mg | ORAL_TABLET | Freq: Four times a day (QID) | ORAL | Status: DC | PRN

## 2016-05-12 NOTE — Telephone Encounter (Signed)
Pt called asking for tramadol refill, Rx phoned in, pt notified

## 2016-05-18 ENCOUNTER — Encounter: Payer: Self-pay | Admitting: Physical Medicine & Rehabilitation

## 2016-05-18 ENCOUNTER — Ambulatory Visit (HOSPITAL_BASED_OUTPATIENT_CLINIC_OR_DEPARTMENT_OTHER): Payer: Commercial Managed Care - HMO | Admitting: Physical Medicine & Rehabilitation

## 2016-05-18 ENCOUNTER — Encounter: Payer: Commercial Managed Care - HMO | Attending: Physical Medicine & Rehabilitation

## 2016-05-18 VITALS — BP 135/89 | HR 70 | Resp 16

## 2016-05-18 DIAGNOSIS — Z79899 Other long term (current) drug therapy: Secondary | ICD-10-CM | POA: Diagnosis not present

## 2016-05-18 DIAGNOSIS — M7061 Trochanteric bursitis, right hip: Secondary | ICD-10-CM | POA: Diagnosis not present

## 2016-05-18 DIAGNOSIS — M797 Fibromyalgia: Secondary | ICD-10-CM | POA: Insufficient documentation

## 2016-05-18 DIAGNOSIS — M7071 Other bursitis of hip, right hip: Secondary | ICD-10-CM | POA: Diagnosis not present

## 2016-05-18 DIAGNOSIS — M1288 Other specific arthropathies, not elsewhere classified, other specified site: Secondary | ICD-10-CM | POA: Insufficient documentation

## 2016-05-18 DIAGNOSIS — Z8711 Personal history of peptic ulcer disease: Secondary | ICD-10-CM | POA: Diagnosis not present

## 2016-05-18 DIAGNOSIS — M7072 Other bursitis of hip, left hip: Secondary | ICD-10-CM | POA: Insufficient documentation

## 2016-05-18 DIAGNOSIS — G894 Chronic pain syndrome: Secondary | ICD-10-CM | POA: Diagnosis present

## 2016-05-18 DIAGNOSIS — M47816 Spondylosis without myelopathy or radiculopathy, lumbar region: Secondary | ICD-10-CM | POA: Diagnosis not present

## 2016-05-18 DIAGNOSIS — Z5181 Encounter for therapeutic drug level monitoring: Secondary | ICD-10-CM | POA: Diagnosis not present

## 2016-05-18 DIAGNOSIS — J45909 Unspecified asthma, uncomplicated: Secondary | ICD-10-CM | POA: Insufficient documentation

## 2016-05-18 DIAGNOSIS — D649 Anemia, unspecified: Secondary | ICD-10-CM | POA: Insufficient documentation

## 2016-05-18 NOTE — Progress Notes (Signed)
Right Trochanteric bursa injection With ultrasound guidance  Indication Trochanteric bursitis. Exam has tenderness over the greater trochanter of the hip. Pain has not responded to conservative care such as exercise therapy and oral medications. Pain interferes with sleep or with mobility 4 Hz curvilinear transducer utilized Informed consent was obtained after describing risks and benefits of the procedure with the patient these include bleeding bruising and infection. Patient has signed written consent form. Patient placed in a lateral decubitus position with the affected hip superior. Point of maximal pain was palpated marked and prepped with Betadine and entered with a needle Under direct ultrasound visualization long axis view Needle slightly withdrawn then 6mg  of betamethasone with 4 cc 1% lidocaine were injected. Patient tolerated procedure well. Post procedure instructions given.

## 2016-05-18 NOTE — Patient Instructions (Signed)
Please call if you would like the left hip injected

## 2016-05-19 ENCOUNTER — Other Ambulatory Visit: Payer: Self-pay | Admitting: Internal Medicine

## 2016-05-19 ENCOUNTER — Telehealth: Payer: Self-pay | Admitting: Internal Medicine

## 2016-05-19 DIAGNOSIS — L309 Dermatitis, unspecified: Secondary | ICD-10-CM

## 2016-05-19 NOTE — Telephone Encounter (Signed)
Alternative to kenalog for eczema or dermatology referral.   Derm referral pended for review if alternative not prescribed.

## 2016-05-19 NOTE — Telephone Encounter (Signed)
Referral ordered

## 2016-05-19 NOTE — Telephone Encounter (Signed)
Patient called to advise that the triamcinolone cream (KENALOG) 0.5 % [id number]  Cream is not working effectively. She is asking if we can prescribe something stronger or even refer her to a dermatologist

## 2016-05-28 ENCOUNTER — Other Ambulatory Visit: Payer: Self-pay | Admitting: Internal Medicine

## 2016-05-28 ENCOUNTER — Encounter: Payer: Self-pay | Admitting: Physical Medicine & Rehabilitation

## 2016-05-28 ENCOUNTER — Ambulatory Visit (HOSPITAL_BASED_OUTPATIENT_CLINIC_OR_DEPARTMENT_OTHER): Payer: Commercial Managed Care - HMO | Admitting: Physical Medicine & Rehabilitation

## 2016-05-28 ENCOUNTER — Telehealth: Payer: Self-pay | Admitting: Emergency Medicine

## 2016-05-28 VITALS — BP 123/89 | HR 96 | Resp 16

## 2016-05-28 DIAGNOSIS — M549 Dorsalgia, unspecified: Secondary | ICD-10-CM | POA: Diagnosis not present

## 2016-05-28 DIAGNOSIS — M170 Bilateral primary osteoarthritis of knee: Secondary | ICD-10-CM

## 2016-05-28 DIAGNOSIS — M797 Fibromyalgia: Secondary | ICD-10-CM | POA: Diagnosis not present

## 2016-05-28 DIAGNOSIS — M4316 Spondylolisthesis, lumbar region: Secondary | ICD-10-CM | POA: Diagnosis not present

## 2016-05-28 DIAGNOSIS — G894 Chronic pain syndrome: Secondary | ICD-10-CM | POA: Diagnosis not present

## 2016-05-28 MED ORDER — MELOXICAM 7.5 MG PO TABS: 7.5000 mg | ORAL_TABLET | Freq: Every day | ORAL | Status: DC

## 2016-05-28 MED ORDER — METHOCARBAMOL 500 MG PO TABS: 500.0000 mg | ORAL_TABLET | Freq: Three times a day (TID) | ORAL | Status: DC | PRN

## 2016-05-28 NOTE — Progress Notes (Signed)
Subjective:    Patient ID: [name], female    DOB: August 17, 1961, 55 y.o.   MRN: [id number]  HPI 55 year old female, chief complaint is low back pain. This has been chronic, but seems to have flared up in the last few days. She's had no falls or other trauma to the area. She has no pain shooting down her legs. She does have a history of bilateral hip pain due to trochanteric bursitis.  She has a history of right-sided body pain with differential of myofascial pain syndrome versus fibromyalgia syndrome.  Underwent right trochanteric bursa injection under ultrasound guidance, 05/18/2016. She states this was helpful for a couple days but pain has recurred. Pain Inventory Average Pain 8 Pain Right Now 8 My pain is sharp and stabbing  In the last 24 hours, has pain interfered with the following? General activity 8 Relation with others 4 Enjoyment of life 6 What TIME of day is your pain at its worst? morning, evening  Sleep (in general) Fair  Pain is worse with: walking, standing and some activites Pain improves with: rest Relief from Meds: NA  Mobility walk with assistance use a cane how many minutes can you walk? 10 ability to climb steps?  yes do you drive?  no  Function not employed: date last employed NA  Neuro/Psych trouble walking spasms  Prior Studies Any changes since last visit?  no Lumbar films from February 2016 reviewed. Mild anterolisthesis 4 mm L4 on L5, mild degenerative changes. Physicians involved in your care Any changes since last visit?  no   Family History  Problem Relation Age of Onset  . Arthritis Other   . Diabetes Other   . Hypertension Other   . Breast cancer Other   . Hypertension Mother   . Diabetes Sister   . Cancer Neg Hx   . Early death Neg Hx   . Hearing loss Neg Hx   . Heart disease Neg Hx   . Hyperlipidemia Neg Hx   . Kidney disease Neg Hx   . Stroke Neg Hx   . Colon cancer Neg Hx   . Hypertension Sister    Social  History   Social History  . Marital Status: Married    Spouse Name: N/A  . Number of Children: N/A  . Years of Education: N/A   Social History Main Topics  . Smoking status: Never Smoker   . Smokeless tobacco: Never Used  . Alcohol Use: No  . Drug Use: No  . Sexual Activity: Yes    Birth Control/ Protection: Post-menopausal, Surgical   Other Topics Concern  . None   Social History Narrative   Past Surgical History  Procedure Laterality Date  . Tubal ligation    . Carpal tunnel release Right    Past Medical History  Diagnosis Date  . Allergy     rhinitis  . Asthma   . Headache(784.0)   . Anemia   . History of stomach ulcers   . Arthritis    BP 123/89 mmHg  Pulse 96  Resp 16  SpO2 95%  LMP 05/17/2012  Opioid Risk Score:   Fall Risk Score:  `1  Depression screen PHQ 2/9  Depression screen [facility] 2/9 11/28/2015 08/08/2015 08/25/2014  Decreased Interest 0 3 0  Down, Depressed, Hopeless 0 0 0  PHQ - 2 Score 0 3 0  Altered sleeping - 1 -  Tired, decreased energy - 1 -  Change in appetite - 0 -  Feeling  bad or failure about yourself  - 0 -  Trouble concentrating - 0 -  Moving slowly or fidgety/restless - 3 -  Suicidal thoughts - 0 -  PHQ-9 Score - 8 -  Difficult doing work/chores - Very difficult -     Review of Systems  Constitutional: Positive for diaphoresis.  Musculoskeletal: Positive for gait problem.  Skin: Positive for rash.  Neurological:       Spasms   All other systems reviewed and are negative.      Objective:   Physical Exam  Constitutional: She is oriented to person, place, and time. She appears well-developed and well-nourished.  HENT:  Head: Normocephalic and atraumatic.  Eyes: Conjunctivae and EOM are normal. Pupils are equal, round, and reactive to light.  Neck: Normal range of motion.  Musculoskeletal: Normal range of motion. She exhibits tenderness. She exhibits no edema.  Neurological: She is alert and oriented to person, place,  and time. She has normal strength. She displays no atrophy. A sensory deficit is present. She exhibits normal muscle tone.  Skin: Skin is warm and dry.  Psychiatric: She has a normal mood and affect.  Nursing note and vitals reviewed.   Tenderness to palpation in the cervical, thoracic and lumbar spine as well as around the knees and legs as well as shoulder area and hip area. Negative straight leg raising. Sensation intact to pinprick, bilateral lower extremities , L2, L3, L4, L5-S1 dermatomal distribution. Motor strength is 5/5 bilateral deltoids, biceps, triceps, grip, hip flexor, knee extensor, ankle dorsiflexor and plantar flexor      Assessment & Plan:  1. Primary complaints of low back pain. However examination showing widespread body pain. Overall picture most consistent with fibromyalgia syndrome She has not undergone physical therapy. I think she would benefit. We'll make referral.  We'll give her 2 week course of meloxicam 7.5 mg per day for flare up of pain as well as Robaxin 500 mg 3 times a day for one to 2 weeks when necessary  The patient will follow-up in 3 months with nurse practitioner. If patient fails to improve, would consider trial of Lyrica

## 2016-05-28 NOTE — Telephone Encounter (Signed)
Pt stated the Orthopedic Dr Colan Neptune doing anything to help her and wanted to know if you can recommend another Orthopedic. Please follow up thanks.

## 2016-05-28 NOTE — Telephone Encounter (Signed)
Who did she see?

## 2016-05-28 NOTE — Telephone Encounter (Signed)
New referral ordered

## 2016-05-28 NOTE — Telephone Encounter (Signed)
Dr. Martina Sinner from [facility]. She would like to which to a different doctor not with [employer] [facility] [employer].

## 2016-06-01 ENCOUNTER — Other Ambulatory Visit: Payer: Self-pay | Admitting: Internal Medicine

## 2016-06-01 DIAGNOSIS — M170 Bilateral primary osteoarthritis of knee: Secondary | ICD-10-CM

## 2016-06-01 NOTE — Telephone Encounter (Signed)
Patient is requesting to be referred to Dr. Charlestine Night for her arthritis.   [id number]

## 2016-06-14 ENCOUNTER — Other Ambulatory Visit: Payer: Self-pay | Admitting: Rheumatology

## 2016-06-14 ENCOUNTER — Ambulatory Visit
Admission: RE | Admit: 2016-06-14 | Discharge: 2016-06-14 | Disposition: A | Discharge status: Home / Self Care | Payer: Commercial Managed Care - HMO | Source: Ambulatory Visit | Attending: Rheumatology | Admitting: Rheumatology

## 2016-06-14 DIAGNOSIS — R1031 Right lower quadrant pain: Secondary | ICD-10-CM

## 2016-06-14 DIAGNOSIS — K5900 Constipation, unspecified: Secondary | ICD-10-CM

## 2016-06-14 DIAGNOSIS — R109 Unspecified abdominal pain: Secondary | ICD-10-CM

## 2016-08-04 ENCOUNTER — Other Ambulatory Visit: Payer: Self-pay | Admitting: Internal Medicine

## 2016-08-04 ENCOUNTER — Telehealth: Payer: Self-pay | Admitting: *Deleted

## 2016-08-04 DIAGNOSIS — M5126 Other intervertebral disc displacement, lumbar region: Secondary | ICD-10-CM

## 2016-08-04 MED ORDER — TRAMADOL HCL 50 MG PO TABS: 50.0000 mg | ORAL_TABLET | Freq: Four times a day (QID) | ORAL | 2 refills | Status: DC | PRN

## 2016-08-04 NOTE — Telephone Encounter (Signed)
rx written

## 2016-08-04 NOTE — Telephone Encounter (Signed)
Notified pt refill called into [facility] spoke w/Yow pharmacist.../lmb

## 2016-08-04 NOTE — Telephone Encounter (Signed)
Rec'd call pt requesting refill on Tramadol...[name]

## 2016-08-26 ENCOUNTER — Encounter: Payer: Self-pay | Admitting: Internal Medicine

## 2016-08-26 ENCOUNTER — Other Ambulatory Visit (INDEPENDENT_AMBULATORY_CARE_PROVIDER_SITE_OTHER): Payer: Commercial Managed Care - HMO

## 2016-08-26 ENCOUNTER — Ambulatory Visit (INDEPENDENT_AMBULATORY_CARE_PROVIDER_SITE_OTHER): Payer: Commercial Managed Care - HMO | Admitting: Internal Medicine

## 2016-08-26 VITALS — BP 132/78 | HR 78 | Temp 98.3°F | Resp 16 | Ht 61.0 in | Wt 242.0 lb

## 2016-08-26 DIAGNOSIS — R1310 Dysphagia, unspecified: Secondary | ICD-10-CM | POA: Diagnosis not present

## 2016-08-26 DIAGNOSIS — Z0000 Encounter for general adult medical examination without abnormal findings: Secondary | ICD-10-CM | POA: Diagnosis not present

## 2016-08-26 DIAGNOSIS — K2100 Gastro-esophageal reflux disease with esophagitis, without bleeding: Secondary | ICD-10-CM | POA: Insufficient documentation

## 2016-08-26 DIAGNOSIS — Z23 Encounter for immunization: Secondary | ICD-10-CM | POA: Diagnosis not present

## 2016-08-26 DIAGNOSIS — R1319 Other dysphagia: Secondary | ICD-10-CM

## 2016-08-26 DIAGNOSIS — Z1231 Encounter for screening mammogram for malignant neoplasm of breast: Secondary | ICD-10-CM

## 2016-08-26 LAB — COMPREHENSIVE METABOLIC PANEL
ALT: 17 U/L (ref 0–35)
AST: 20 U/L (ref 0–37)
Albumin: 4.1 g/dL (ref 3.5–5.2)
Alkaline Phosphatase: 119 U/L — ABNORMAL HIGH (ref 39–117)
BUN: 12 mg/dL (ref 6–23)
CALCIUM: 9.7 mg/dL (ref 8.4–10.5)
CHLORIDE: 105 meq/L (ref 96–112)
CO2: 29 meq/L (ref 19–32)
Creatinine, Ser: 1.12 mg/dL (ref 0.40–1.20)
GFR: 64.91 mL/min (ref >60.00)
Glucose, Bld: 87 mg/dL (ref 70–99)
POTASSIUM: 4 meq/L (ref 3.5–5.1)
Sodium: 141 mEq/L (ref 135–145)
Total Bilirubin: 0.3 mg/dL (ref 0.2–1.2)
Total Protein: 8.1 g/dL (ref 6.0–8.3)

## 2016-08-26 LAB — CBC WITH DIFFERENTIAL/PLATELET
BASOS PCT: 0.5 % (ref 0.0–3.0)
Basophils Absolute: 0 10*3/uL (ref 0.0–0.1)
Eosinophils Absolute: 0.2 10*3/uL (ref 0.0–0.7)
Eosinophils Relative: 2.8 % (ref 0.0–5.0)
HEMATOCRIT: 39.8 % (ref 36.0–46.0)
HEMOGLOBIN: 13 g/dL (ref 12.0–15.0)
LYMPHS PCT: 47 % — AB (ref 12.0–46.0)
Lymphs Abs: 2.5 10*3/uL (ref 0.7–4.0)
MCHC: 32.6 g/dL (ref 30.0–36.0)
MCV: 79.1 fl (ref 78.0–100.0)
MONOS PCT: 10.9 % (ref 3.0–12.0)
Monocytes Absolute: 0.6 10*3/uL (ref 0.1–1.0)
NEUTROS ABS: 2.1 10*3/uL (ref 1.4–7.7)
Neutrophils Relative %: 38.8 % — ABNORMAL LOW (ref 43.0–77.0)
PLATELETS: 221 10*3/uL (ref 150.0–400.0)
RBC: 5.03 Mil/uL (ref 3.87–5.11)
RDW: 16.8 % — AB (ref 11.5–15.5)
WBC: 5.4 10*3/uL (ref 4.0–10.5)

## 2016-08-26 LAB — LIPID PANEL
CHOL/HDL RATIO: 4
Cholesterol: 188 mg/dL (ref 0–200)
HDL: 46.5 mg/dL (ref >39.00)
LDL CALC: 128 mg/dL — AB (ref 0–99)
NonHDL: 141.83
TRIGLYCERIDES: 70 mg/dL (ref 0.0–149.0)
VLDL: 14 mg/dL (ref 0.0–40.0)

## 2016-08-26 LAB — TSH: TSH: 1.12 u[IU]/mL (ref 0.35–4.50)

## 2016-08-26 MED ORDER — DEXLANSOPRAZOLE 60 MG PO CPDR: 60.0000 mg | DELAYED_RELEASE_CAPSULE | Freq: Every day | ORAL | 11 refills | Status: DC

## 2016-08-26 NOTE — Progress Notes (Signed)
Subjective:  Patient ID: [name], female    DOB: 1961/08/09  Age: 55 y.o. MRN: [id number]  CC: Annual Exam and Gastroesophageal Reflux   HPI [name] presents for a CPX.  She complains of worsening heartburn with dysphagia but no odynophagia. She takes tums to control her symptoms. This has been worsening over last 3-4 months. She takes NSAIDs for chronic osteoarthritis pain. Denies weight loss, abdominal pain, diarrhea, constipation, melena, or blood in her stools.  She has been seeing orthopedics and tells me that she has an MRI ordered to try to find out where her pain is coming from. She has diffuse low back and lower extremity pain.  Outpatient Medications Prior to Visit  Medication Sig Dispense Refill  . desonide (DESOWEN) 0.05 % cream Apply topically 2 (two) times daily. 30 g 1  . traMADol (ULTRAM) 50 MG tablet Take 1 tablet (50 mg total) by mouth every 6 (six) hours as needed. 100 tablet 2  . triamcinolone cream (KENALOG) 0.5 % Apply topically 3 (three) times daily. 100 g 5  . Aspirin-Salicylamide-Caffeine (BC FAST PAIN RELIEF) 650-195-33.3 MG PACK Take 1 Package by mouth daily.    Marland Kitchen lubiprostone (AMITIZA) 24 MCG capsule Take 1 capsule (24 mcg total) by mouth 2 (two) times daily with a meal. (Patient not taking: Reported on 08/26/2016) 180 capsule 3  . meloxicam (MOBIC) 7.5 MG tablet Take 1 tablet (7.5 mg total) by mouth daily. (Patient not taking: Reported on 08/26/2016) 14 tablet 0  . methocarbamol (ROBAXIN) 500 MG tablet Take 1 tablet (500 mg total) by mouth every 8 (eight) hours as needed for muscle spasms. (Patient not taking: Reported on 08/26/2016) 30 tablet 1  . mometasone (NASONEX) 50 MCG/ACT nasal spray Place 4 sprays into the nose daily. For allergies (Patient not taking: Reported on 08/26/2016) 17 g 11  . nortriptyline (PAMELOR) 25 MG capsule Take 1 capsule (25 mg total) by mouth at bedtime. Stop trazodone (Patient not taking: Reported on 08/26/2016) 30  capsule 1   No facility-administered medications prior to visit.     ROS Review of Systems  Constitutional: Negative.  Negative for appetite change, chills, diaphoresis, fatigue and fever.  HENT: Positive for trouble swallowing. Negative for sore throat and voice change.   Eyes: Negative.   Respiratory: Negative.  Negative for cough, choking, chest tightness, shortness of breath and stridor.   Cardiovascular: Negative.  Negative for chest pain, palpitations and leg swelling.  Gastrointestinal: Negative for abdominal pain, blood in stool, constipation, diarrhea, nausea and vomiting.  Endocrine: Negative.   Genitourinary: Negative.   Musculoskeletal: Positive for arthralgias and back pain. Negative for gait problem, joint swelling, myalgias and neck pain.  Skin: Negative.   Allergic/Immunologic: Negative.   Neurological: Negative.   Hematological: Negative.  Negative for adenopathy. Does not bruise/bleed easily.  Psychiatric/Behavioral: Negative.     Objective:  BP 132/78   Pulse 78   Temp 98.3 F (36.8 C) (Oral)   Resp 16   Ht 5\' 1"  (1.549 m)   Wt 242 lb (109.8 kg)   LMP 05/17/2012   SpO2 98%   BMI 45.73 kg/m   BP Readings from Last 3 Encounters:  08/26/16 132/78  05/28/16 123/89  05/18/16 135/89    Wt Readings from Last 3 Encounters:  08/26/16 242 lb (109.8 kg)  01/30/16 241 lb (109.3 kg)  01/16/16 241 lb (109.3 kg)    Physical Exam  Constitutional: She is oriented to person, place, and time. No distress.  HENT:  Mouth/Throat: Oropharynx is clear and moist. No oropharyngeal exudate.  Eyes: Conjunctivae are normal. Right eye exhibits no discharge. Left eye exhibits no discharge. No scleral icterus.  Neck: Normal range of motion. Neck supple. No JVD present. No tracheal deviation present. No thyromegaly present.  Cardiovascular: Normal rate, regular rhythm, normal heart sounds and intact distal pulses.  Exam reveals no gallop and no friction rub.   No murmur  heard. Pulmonary/Chest: Effort normal and breath sounds normal. No respiratory distress. She has no wheezes. She has no rales. She exhibits no tenderness.  Abdominal: Soft. Bowel sounds are normal. She exhibits no distension and no mass. There is no tenderness. There is no rebound and no guarding.  Musculoskeletal: Normal range of motion. She exhibits no edema, tenderness or deformity.  Lymphadenopathy:    She has no cervical adenopathy.  Neurological: She is oriented to person, place, and time.  Skin: Skin is warm and dry. No rash noted. She is not diaphoretic. No erythema. No pallor.  Psychiatric: She has a normal mood and affect. Her behavior is normal. Judgment and thought content normal.  Vitals reviewed.   Lab Results  Component Value Date   WBC 5.4 08/26/2016   HGB 13.0 08/26/2016   HCT 39.8 08/26/2016   PLT 221.0 08/26/2016   GLUCOSE 87 08/26/2016   CHOL 188 08/26/2016   TRIG 70.0 08/26/2016   HDL 46.50 08/26/2016   LDLCALC 128 (H) 08/26/2016   ALT 17 08/26/2016   AST 20 08/26/2016   NA 141 08/26/2016   K 4.0 08/26/2016   CL 105 08/26/2016   CREATININE 1.12 08/26/2016   BUN 12 08/26/2016   CO2 29 08/26/2016   TSH 1.12 08/26/2016    Dg Abd 1 View  Result Date: 06/14/2016 CLINICAL DATA:  Right lower quadrant pain for 2 months. EXAM: ABDOMEN - 1 VIEW COMPARISON:  CT abdomen pelvis 03/06/2005 FINDINGS: The bowel gas pattern is normal. No radio-opaque calculi or other significant radiographic abnormality are seen. IMPRESSION: Negative. Electronically Signed   By: Fidela Salisbury M.D.   On: 06/14/2016 14:50   Dg Hip Unilat With Pelvis 2-3 Views Right  Result Date: 06/14/2016 CLINICAL DATA:  Right hip pain.  Hip pain radiates to groin. EXAM: DG HIP (WITH OR WITHOUT PELVIS) 2-3V RIGHT COMPARISON:  CT 03/07/2015 . FINDINGS: No acute bony or joint abnormality identified. No evidence of fracture or dislocation . Mild degenerative changes right hip. IMPRESSION: 1. Mild  degenerative changes right hip. 2. No acute bony or joint abnormality . Electronically Signed   By: Marcello Moores  Register   On: 06/14/2016 14:49    Assessment & Plan:   Maylena was seen today for annual exam and gastroesophageal reflux.  Diagnoses and all orders for this visit:  Encounter for screening mammogram for breast cancer  Routine general medical examination at a health care facility- exam completed, labs ordered and reviewed, vaccines reviewed and updated, her Pap is up-to-date, she was referred for mammogram, her colonoscopy is up-to-date, patient education material was given. -     Lipid panel; Future -     Comprehensive metabolic panel; Future -     CBC with Differential/Platelet; Future -     TSH; Future  Gastroesophageal reflux disease with esophagitis- we'll start a PPI for symptom relief and and esophageal healing. It sounds like she may have some upper GI complications of asked her to see GI. -     dexlansoprazole (DEXILANT) 60 MG capsule; Take 1 capsule (60 mg  total) by mouth daily. -     Ambulatory referral to Gastroenterology  Esophageal dysphagia- as above -     Ambulatory referral to Gastroenterology  Need for prophylactic vaccination and inoculation against influenza -     Flu Vaccine QUAD 36+ mos IM   I have discontinued Ms. Jarrard's mometasone, lubiprostone, nortriptyline, Aspirin-Salicylamide-Caffeine, meloxicam, and methocarbamol. I am also having her start on dexlansoprazole. Additionally, I am having her maintain her triamcinolone cream, desonide, and traMADol.  Meds ordered this encounter  Medications  . dexlansoprazole (DEXILANT) 60 MG capsule    Sig: Take 1 capsule (60 mg total) by mouth daily.    Dispense:  30 capsule    Refill:  11     Follow-up: No Follow-up on file.  [name], MD

## 2016-08-26 NOTE — Progress Notes (Signed)
Pre visit review using our clinic review tool, if applicable. No additional management support is needed unless otherwise documented below in the visit note. 

## 2016-08-27 ENCOUNTER — Encounter: Payer: Commercial Managed Care - HMO | Admitting: Registered Nurse

## 2016-08-29 NOTE — Patient Instructions (Signed)
Preventive Care for Adults, Female A healthy lifestyle and preventive care can promote health and wellness. Preventive health guidelines for women include the following key practices.  A routine yearly physical is a good way to check with your health care provider about your health and preventive screening. It is a chance to share any concerns and updates on your health and to receive a thorough exam.  Visit your dentist for a routine exam and preventive care every 6 months. Brush your teeth twice a day and floss once a day. Good oral hygiene prevents tooth decay and gum disease.  The frequency of eye exams is based on your age, health, family medical history, use of contact lenses, and other factors. Follow your health care provider's recommendations for frequency of eye exams.  Eat a healthy diet. Foods like vegetables, fruits, whole grains, low-fat dairy products, and lean protein foods contain the nutrients you need without too many calories. Decrease your intake of foods high in solid fats, added sugars, and salt. Eat the right amount of calories for you.Get information about a proper diet from your health care provider, if necessary.  Regular physical exercise is one of the most important things you can do for your health. Most adults should get at least 150 minutes of moderate-intensity exercise (any activity that increases your heart rate and causes you to sweat) each week. In addition, most adults need muscle-strengthening exercises on 2 or more days a week.  Maintain a healthy weight. The body mass index (BMI) is a screening tool to identify possible weight problems. It provides an estimate of body fat based on height and weight. Your health care provider can find your BMI and can help you achieve or maintain a healthy weight.For adults 20 years and older:  A BMI below 18.5 is considered underweight.  A BMI of 18.5 to 24.9 is normal.  A BMI of 25 to 29.9 is considered overweight.  A  BMI of 30 and above is considered obese.  Maintain normal blood lipids and cholesterol levels by exercising and minimizing your intake of saturated fat. Eat a balanced diet with plenty of fruit and vegetables. Blood tests for lipids and cholesterol should begin at age 45 and be repeated every 5 years. If your lipid or cholesterol levels are high, you are over 50, or you are at high risk for heart disease, you may need your cholesterol levels checked more frequently.Ongoing high lipid and cholesterol levels should be treated with medicines if diet and exercise are not working.  If you smoke, find out from your health care provider how to quit. If you do not use tobacco, do not start.  Lung cancer screening is recommended for adults aged 45-80 years who are at high risk for developing lung cancer because of a history of smoking. A yearly low-dose CT scan of the lungs is recommended for people who have at least a 30-pack-year history of smoking and are a current smoker or have quit within the past 15 years. A pack year of smoking is smoking an average of 1 pack of cigarettes a day for 1 year (for example: 1 pack a day for 30 years or 2 packs a day for 15 years). Yearly screening should continue until the smoker has stopped smoking for at least 15 years. Yearly screening should be stopped for people who develop a health problem that would prevent them from having lung cancer treatment.  If you are pregnant, do not drink alcohol. If you are  breastfeeding, be very cautious about drinking alcohol. If you are not pregnant and choose to drink alcohol, do not have more than 1 drink per day. One drink is considered to be 12 ounces (355 mL) of beer, 5 ounces (148 mL) of wine, or 1.5 ounces (44 mL) of liquor.  Avoid use of street drugs. Do not share needles with anyone. Ask for help if you need support or instructions about stopping the use of drugs.  High blood pressure causes heart disease and increases the risk  of stroke. Your blood pressure should be checked at least every 1 to 2 years. Ongoing high blood pressure should be treated with medicines if weight loss and exercise do not work.  If you are 55-79 years old, ask your health care provider if you should take aspirin to prevent strokes.  Diabetes screening is done by taking a blood sample to check your blood glucose level after you have not eaten for a certain period of time (fasting). If you are not overweight and you do not have risk factors for diabetes, you should be screened once every 3 years starting at age 45. If you are overweight or obese and you are 40-70 years of age, you should be screened for diabetes every year as part of your cardiovascular risk assessment.  Breast cancer screening is essential preventive care for women. You should practice "breast self-awareness." This means understanding the normal appearance and feel of your breasts and may include breast self-examination. Any changes detected, no matter how small, should be reported to a health care provider. Women in their 20s and 30s should have a clinical breast exam (CBE) by a health care provider as part of a regular health exam every 1 to 3 years. After age 40, women should have a CBE every year. Starting at age 40, women should consider having a mammogram (breast X-ray test) every year. Women who have a family history of breast cancer should talk to their health care provider about genetic screening. Women at a high risk of breast cancer should talk to their health care providers about having an MRI and a mammogram every year.  Breast cancer gene (BRCA)-related cancer risk assessment is recommended for women who have family members with BRCA-related cancers. BRCA-related cancers include breast, ovarian, tubal, and peritoneal cancers. Having family members with these cancers may be associated with an increased risk for harmful changes (mutations) in the breast cancer genes BRCA1 and  BRCA2. Results of the assessment will determine the need for genetic counseling and BRCA1 and BRCA2 testing.  Your health care provider may recommend that you be screened regularly for cancer of the pelvic organs (ovaries, uterus, and vagina). This screening involves a pelvic examination, including checking for microscopic changes to the surface of your cervix (Pap test). You may be encouraged to have this screening done every 3 years, beginning at age 21.  For women ages 30-65, health care providers may recommend pelvic exams and Pap testing every 3 years, or they may recommend the Pap and pelvic exam, combined with testing for human papilloma virus (HPV), every 5 years. Some types of HPV increase your risk of cervical cancer. Testing for HPV may also be done on women of any age with unclear Pap test results.  Other health care providers may not recommend any screening for nonpregnant women who are considered low risk for pelvic cancer and who do not have symptoms. Ask your health care provider if a screening pelvic exam is right for   you.  If you have had past treatment for cervical cancer or a condition that could lead to cancer, you need Pap tests and screening for cancer for at least 20 years after your treatment. If Pap tests have been discontinued, your risk factors (such as having a new sexual partner) need to be reassessed to determine if screening should resume. Some women have medical problems that increase the chance of getting cervical cancer. In these cases, your health care provider may recommend more frequent screening and Pap tests.  Colorectal cancer can be detected and often prevented. Most routine colorectal cancer screening begins at the age of 50 years and continues through age 75 years. However, your health care provider may recommend screening at an earlier age if you have risk factors for colon cancer. On a yearly basis, your health care provider may provide home test kits to check  for hidden blood in the stool. Use of a small camera at the end of a tube, to directly examine the colon (sigmoidoscopy or colonoscopy), can detect the earliest forms of colorectal cancer. Talk to your health care provider about this at age 50, when routine screening begins. Direct exam of the colon should be repeated every 5-10 years through age 75 years, unless early forms of precancerous polyps or small growths are found.  People who are at an increased risk for hepatitis B should be screened for this virus. You are considered at high risk for hepatitis B if:  You were born in a country where hepatitis B occurs often. Talk with your health care provider about which countries are considered high risk.  Your parents were born in a high-risk country and you have not received a shot to protect against hepatitis B (hepatitis B vaccine).  You have HIV or AIDS.  You use needles to inject street drugs.  You live with, or have sex with, someone who has hepatitis B.  You get hemodialysis treatment.  You take certain medicines for conditions like cancer, organ transplantation, and autoimmune conditions.  Hepatitis C blood testing is recommended for all people born from 1945 through 1965 and any individual with known risks for hepatitis C.  Practice safe sex. Use condoms and avoid high-risk sexual practices to reduce the spread of sexually transmitted infections (STIs). STIs include gonorrhea, chlamydia, syphilis, trichomonas, herpes, HPV, and human immunodeficiency virus (HIV). Herpes, HIV, and HPV are viral illnesses that have no cure. They can result in disability, cancer, and death.  You should be screened for sexually transmitted illnesses (STIs) including gonorrhea and chlamydia if:  You are sexually active and are younger than 24 years.  You are older than 24 years and your health care provider tells you that you are at risk for this type of infection.  Your sexual activity has changed  since you were last screened and you are at an increased risk for chlamydia or gonorrhea. Ask your health care provider if you are at risk.  If you are at risk of being infected with HIV, it is recommended that you take a prescription medicine daily to prevent HIV infection. This is called preexposure prophylaxis (PrEP). You are considered at risk if:  You are sexually active and do not regularly use condoms or know the HIV status of your partner(s).  You take drugs by injection.  You are sexually active with a partner who has HIV.  Talk with your health care provider about whether you are at high risk of being infected with HIV. If   you choose to begin PrEP, you should first be tested for HIV. You should then be tested every 3 months for as long as you are taking PrEP.  Osteoporosis is a disease in which the bones lose minerals and strength with aging. This can result in serious bone fractures or breaks. The risk of osteoporosis can be identified using a bone density scan. Women ages 67 years and over and women at risk for fractures or osteoporosis should discuss screening with their health care providers. Ask your health care provider whether you should take a calcium supplement or vitamin D to reduce the rate of osteoporosis.  Menopause can be associated with physical symptoms and risks. Hormone replacement therapy is available to decrease symptoms and risks. You should talk to your health care provider about whether hormone replacement therapy is right for you.  Use sunscreen. Apply sunscreen liberally and repeatedly throughout the day. You should seek shade when your shadow is shorter than you. Protect yourself by wearing long sleeves, pants, a wide-brimmed hat, and sunglasses year round, whenever you are outdoors.  Once a month, do a whole body skin exam, using a mirror to look at the skin on your back. Tell your health care provider of new moles, moles that have irregular borders, moles that  are larger than a pencil eraser, or moles that have changed in shape or color.  Stay current with required vaccines (immunizations).  Influenza vaccine. All adults should be immunized every year.  Tetanus, diphtheria, and acellular pertussis (Td, Tdap) vaccine. Pregnant women should receive 1 dose of Tdap vaccine during each pregnancy. The dose should be obtained regardless of the length of time since the last dose. Immunization is preferred during the 27th-36th week of gestation. An adult who has not previously received Tdap or who does not know her vaccine status should receive 1 dose of Tdap. This initial dose should be followed by tetanus and diphtheria toxoids (Td) booster doses every 10 years. Adults with an unknown or incomplete history of completing a 3-dose immunization series with Td-containing vaccines should begin or complete a primary immunization series including a Tdap dose. Adults should receive a Td booster every 10 years.  Varicella vaccine. An adult without evidence of immunity to varicella should receive 2 doses or a second dose if she has previously received 1 dose. Pregnant females who do not have evidence of immunity should receive the first dose after pregnancy. This first dose should be obtained before leaving the health care facility. The second dose should be obtained 4-8 weeks after the first dose.  Human papillomavirus (HPV) vaccine. Females aged 13-26 years who have not received the vaccine previously should obtain the 3-dose series. The vaccine is not recommended for use in pregnant females. However, pregnancy testing is not needed before receiving a dose. If a female is found to be pregnant after receiving a dose, no treatment is needed. In that case, the remaining doses should be delayed until after the pregnancy. Immunization is recommended for any person with an immunocompromised condition through the age of 61 years if she did not get any or all doses earlier. During the  3-dose series, the second dose should be obtained 4-8 weeks after the first dose. The third dose should be obtained 24 weeks after the first dose and 16 weeks after the second dose.  Zoster vaccine. One dose is recommended for adults aged 30 years or older unless certain conditions are present.  Measles, mumps, and rubella (MMR) vaccine. Adults born  before 1957 generally are considered immune to measles and mumps. Adults born in 1957 or later should have 1 or more doses of MMR vaccine unless there is a contraindication to the vaccine or there is laboratory evidence of immunity to each of the three diseases. A routine second dose of MMR vaccine should be obtained at least 28 days after the first dose for students attending postsecondary schools, health care workers, or international travelers. People who received inactivated measles vaccine or an unknown type of measles vaccine during 1963-1967 should receive 2 doses of MMR vaccine. People who received inactivated mumps vaccine or an unknown type of mumps vaccine before 1979 and are at high risk for mumps infection should consider immunization with 2 doses of MMR vaccine. For females of childbearing age, rubella immunity should be determined. If there is no evidence of immunity, females who are not pregnant should be vaccinated. If there is no evidence of immunity, females who are pregnant should delay immunization until after pregnancy. Unvaccinated health care workers born before 1957 who lack laboratory evidence of measles, mumps, or rubella immunity or laboratory confirmation of disease should consider measles and mumps immunization with 2 doses of MMR vaccine or rubella immunization with 1 dose of MMR vaccine.  Pneumococcal 13-valent conjugate (PCV13) vaccine. When indicated, a person who is uncertain of his immunization history and has no record of immunization should receive the PCV13 vaccine. All adults 65 years of age and older should receive this  vaccine. An adult aged 19 years or older who has certain medical conditions and has not been previously immunized should receive 1 dose of PCV13 vaccine. This PCV13 should be followed with a dose of pneumococcal polysaccharide (PPSV23) vaccine. Adults who are at high risk for pneumococcal disease should obtain the PPSV23 vaccine at least 8 weeks after the dose of PCV13 vaccine. Adults older than 55 years of age who have normal immune system function should obtain the PPSV23 vaccine dose at least 1 year after the dose of PCV13 vaccine.  Pneumococcal polysaccharide (PPSV23) vaccine. When PCV13 is also indicated, PCV13 should be obtained first. All adults aged 65 years and older should be immunized. An adult younger than age 65 years who has certain medical conditions should be immunized. Any person who resides in a nursing home or long-term care facility should be immunized. An adult smoker should be immunized. People with an immunocompromised condition and certain other conditions should receive both PCV13 and PPSV23 vaccines. People with human immunodeficiency virus (HIV) infection should be immunized as soon as possible after diagnosis. Immunization during chemotherapy or radiation therapy should be avoided. Routine use of PPSV23 vaccine is not recommended for American Indians, [place] Natives, or people younger than 65 years unless there are medical conditions that require PPSV23 vaccine. When indicated, people who have unknown immunization and have no record of immunization should receive PPSV23 vaccine. One-time revaccination 5 years after the first dose of PPSV23 is recommended for people aged 19-64 years who have chronic kidney failure, nephrotic syndrome, asplenia, or immunocompromised conditions. People who received 1-2 doses of PPSV23 before age 65 years should receive another dose of PPSV23 vaccine at age 65 years or later if at least 5 years have passed since the previous dose. Doses of PPSV23 are not  needed for people immunized with PPSV23 at or after age 65 years.  Meningococcal vaccine. Adults with asplenia or persistent complement component deficiencies should receive 2 doses of quadrivalent meningococcal conjugate (MenACWY-D) vaccine. The doses should be obtained   at least 2 months apart. Microbiologists working with certain meningococcal bacteria, [place] recruits, people at risk during an outbreak, and people who travel to or live in countries with a high rate of meningitis should be immunized. A first-year college student up through age 34 years who is living in a residence hall should receive a dose if she did not receive a dose on or after her 16th birthday. Adults who have certain high-risk conditions should receive one or more doses of vaccine.  Hepatitis A vaccine. Adults who wish to be protected from this disease, have certain high-risk conditions, work with hepatitis A-infected animals, work in hepatitis A research labs, or travel to or work in countries with a high rate of hepatitis A should be immunized. Adults who were previously unvaccinated and who anticipate close contact with an international adoptee during the first 60 days after arrival in the [place] from a country with a high rate of hepatitis A should be immunized.  Hepatitis B vaccine. Adults who wish to be protected from this disease, have certain high-risk conditions, may be exposed to blood or other infectious body fluids, are household contacts or sex partners of hepatitis B positive people, are clients or workers in certain care facilities, or travel to or work in countries with a high rate of hepatitis B should be immunized.  Haemophilus influenzae type b (Hib) vaccine. A previously unvaccinated person with asplenia or sickle cell disease or having a scheduled splenectomy should receive 1 dose of Hib vaccine. Regardless of previous immunization, a recipient of a hematopoietic stem cell transplant should receive a  3-dose series 6-12 months after her successful transplant. Hib vaccine is not recommended for adults with HIV infection. Preventive Services / Frequency Ages 35 to 4 years  Blood pressure check.** / Every 3-5 years.  Lipid and cholesterol check.** / Every 5 years beginning at age 60.  Clinical breast exam.** / Every 3 years for women in their 71s and 10s.  BRCA-related cancer risk assessment.** / For women who have family members with a BRCA-related cancer (breast, ovarian, tubal, or peritoneal cancers).  Pap test.** / Every 2 years from ages 76 through 26. Every 3 years starting at age 61 through age 76 or 93 with a history of 3 consecutive normal Pap tests.  HPV screening.** / Every 3 years from ages 37 through ages 60 to 51 with a history of 3 consecutive normal Pap tests.  Hepatitis C blood test.** / For any individual with known risks for hepatitis C.  Skin self-exam. / Monthly.  Influenza vaccine. / Every year.  Tetanus, diphtheria, and acellular pertussis (Tdap, Td) vaccine.** / Consult your health care provider. Pregnant women should receive 1 dose of Tdap vaccine during each pregnancy. 1 dose of Td every 10 years.  Varicella vaccine.** / Consult your health care provider. Pregnant females who do not have evidence of immunity should receive the first dose after pregnancy.  HPV vaccine. / 3 doses over 6 months, if 93 and younger. The vaccine is not recommended for use in pregnant females. However, pregnancy testing is not needed before receiving a dose.  Measles, mumps, rubella (MMR) vaccine.** / You need at least 1 dose of MMR if you were born in 1957 or later. You may also need a 2nd dose. For females of childbearing age, rubella immunity should be determined. If there is no evidence of immunity, females who are not pregnant should be vaccinated. If there is no evidence of immunity, females who are  pregnant should delay immunization until after pregnancy.  Pneumococcal  13-valent conjugate (PCV13) vaccine.** / Consult your health care provider.  Pneumococcal polysaccharide (PPSV23) vaccine.** / 1 to 2 doses if you smoke cigarettes or if you have certain conditions.  Meningococcal vaccine.** / 1 dose if you are age 68 to 8 years and a Market researcher living in a residence hall, or have one of several medical conditions, you need to get vaccinated against meningococcal disease. You may also need additional booster doses.  Hepatitis A vaccine.** / Consult your health care provider.  Hepatitis B vaccine.** / Consult your health care provider.  Haemophilus influenzae type b (Hib) vaccine.** / Consult your health care provider. Ages 7 to 53 years  Blood pressure check.** / Every year.  Lipid and cholesterol check.** / Every 5 years beginning at age 25 years.  Lung cancer screening. / Every year if you are aged 11-80 years and have a 30-pack-year history of smoking and currently smoke or have quit within the past 15 years. Yearly screening is stopped once you have quit smoking for at least 15 years or develop a health problem that would prevent you from having lung cancer treatment.  Clinical breast exam.** / Every year after age 48 years.  BRCA-related cancer risk assessment.** / For women who have family members with a BRCA-related cancer (breast, ovarian, tubal, or peritoneal cancers).  Mammogram.** / Every year beginning at age 41 years and continuing for as long as you are in good health. Consult with your health care provider.  Pap test.** / Every 3 years starting at age 65 years through age 37 or 70 years with a history of 3 consecutive normal Pap tests.  HPV screening.** / Every 3 years from ages 72 years through ages 60 to 40 years with a history of 3 consecutive normal Pap tests.  Fecal occult blood test (FOBT) of stool. / Every year beginning at age 21 years and continuing until age 5 years. You may not need to do this test if you get  a colonoscopy every 10 years.  Flexible sigmoidoscopy or colonoscopy.** / Every 5 years for a flexible sigmoidoscopy or every 10 years for a colonoscopy beginning at age 35 years and continuing until age 48 years.  Hepatitis C blood test.** / For all people born from [birth year] through 1965 and any individual with known risks for hepatitis C.  Skin self-exam. / Monthly.  Influenza vaccine. / Every year.  Tetanus, diphtheria, and acellular pertussis (Tdap/Td) vaccine.** / Consult your health care provider. Pregnant women should receive 1 dose of Tdap vaccine during each pregnancy. 1 dose of Td every 10 years.  Varicella vaccine.** / Consult your health care provider. Pregnant females who do not have evidence of immunity should receive the first dose after pregnancy.  Zoster vaccine.** / 1 dose for adults aged 30 years or older.  Measles, mumps, rubella (MMR) vaccine.** / You need at least 1 dose of MMR if you were born in 1957 or later. You may also need a second dose. For females of childbearing age, rubella immunity should be determined. If there is no evidence of immunity, females who are not pregnant should be vaccinated. If there is no evidence of immunity, females who are pregnant should delay immunization until after pregnancy.  Pneumococcal 13-valent conjugate (PCV13) vaccine.** / Consult your health care provider.  Pneumococcal polysaccharide (PPSV23) vaccine.** / 1 to 2 doses if you smoke cigarettes or if you have certain conditions.  Meningococcal vaccine.** /  Consult your health care provider.  Hepatitis A vaccine.** / Consult your health care provider.  Hepatitis B vaccine.** / Consult your health care provider.  Haemophilus influenzae type b (Hib) vaccine.** / Consult your health care provider. Ages 64 years and over  Blood pressure check.** / Every year.  Lipid and cholesterol check.** / Every 5 years beginning at age 23 years.  Lung cancer screening. / Every year if you  are aged 16-80 years and have a 30-pack-year history of smoking and currently smoke or have quit within the past 15 years. Yearly screening is stopped once you have quit smoking for at least 15 years or develop a health problem that would prevent you from having lung cancer treatment.  Clinical breast exam.** / Every year after age 74 years.  BRCA-related cancer risk assessment.** / For women who have family members with a BRCA-related cancer (breast, ovarian, tubal, or peritoneal cancers).  Mammogram.** / Every year beginning at age 44 years and continuing for as long as you are in good health. Consult with your health care provider.  Pap test.** / Every 3 years starting at age 58 years through age 22 or 39 years with 3 consecutive normal Pap tests. Testing can be stopped between 65 and 70 years with 3 consecutive normal Pap tests and no abnormal Pap or HPV tests in the past 10 years.  HPV screening.** / Every 3 years from ages 64 years through ages 70 or 61 years with a history of 3 consecutive normal Pap tests. Testing can be stopped between 65 and 70 years with 3 consecutive normal Pap tests and no abnormal Pap or HPV tests in the past 10 years.  Fecal occult blood test (FOBT) of stool. / Every year beginning at age 40 years and continuing until age 27 years. You may not need to do this test if you get a colonoscopy every 10 years.  Flexible sigmoidoscopy or colonoscopy.** / Every 5 years for a flexible sigmoidoscopy or every 10 years for a colonoscopy beginning at age 7 years and continuing until age 32 years.  Hepatitis C blood test.** / For all people born from [birth year] through 1965 and any individual with known risks for hepatitis C.  Osteoporosis screening.** / A one-time screening for women ages 30 years and over and women at risk for fractures or osteoporosis.  Skin self-exam. / Monthly.  Influenza vaccine. / Every year.  Tetanus, diphtheria, and acellular pertussis (Tdap/Td)  vaccine.** / 1 dose of Td every 10 years.  Varicella vaccine.** / Consult your health care provider.  Zoster vaccine.** / 1 dose for adults aged 35 years or older.  Pneumococcal 13-valent conjugate (PCV13) vaccine.** / Consult your health care provider.  Pneumococcal polysaccharide (PPSV23) vaccine.** / 1 dose for all adults aged 46 years and older.  Meningococcal vaccine.** / Consult your health care provider.  Hepatitis A vaccine.** / Consult your health care provider.  Hepatitis B vaccine.** / Consult your health care provider.  Haemophilus influenzae type b (Hib) vaccine.** / Consult your health care provider. ** Family history and personal history of risk and conditions may change your health care provider's recommendations.   This information is not intended to replace advice given to you by your health care provider. Make sure you discuss any questions you have with your health care provider.   Document Released: 12/21/2001 Document Revised: 11/15/2014 Document Reviewed: 03/22/2011 Elsevier Interactive Patient Education [employer].

## 2016-09-06 ENCOUNTER — Other Ambulatory Visit: Payer: Self-pay | Admitting: Internal Medicine

## 2016-09-06 ENCOUNTER — Telehealth: Payer: Self-pay | Admitting: *Deleted

## 2016-09-06 DIAGNOSIS — M5126 Other intervertebral disc displacement, lumbar region: Secondary | ICD-10-CM

## 2016-09-06 NOTE — Telephone Encounter (Signed)
Left msg on triage requesting refill on her Tramadol...Johny Chess

## 2016-09-06 NOTE — Telephone Encounter (Signed)
Called pt inform her she should have a refill left on rx from 9/27, pt stated they did not give her any refills. Inform will call pharmacy to check. Called [facility] spoke w/pharmacist Yow verified rx for Tramadol which he stated they received 9/27, but on for 1 time only. Gave verbal to refill she should have had 2 refills left from script 9/27. Notified pt refill has been called in...Johny Chess

## 2016-10-14 ENCOUNTER — Encounter: Payer: Self-pay | Admitting: *Deleted

## 2016-11-22 ENCOUNTER — Ambulatory Visit: Payer: Commercial Managed Care - HMO | Admitting: Internal Medicine

## 2016-12-02 ENCOUNTER — Ambulatory Visit: Payer: Commercial Managed Care - HMO

## 2016-12-06 ENCOUNTER — Ambulatory Visit
Admission: RE | Admit: 2016-12-06 | Discharge: 2016-12-06 | Disposition: A | Discharge status: Home / Self Care | Payer: Commercial Managed Care - HMO | Source: Ambulatory Visit | Attending: Internal Medicine | Admitting: Internal Medicine

## 2016-12-06 DIAGNOSIS — Z1231 Encounter for screening mammogram for malignant neoplasm of breast: Secondary | ICD-10-CM

## 2016-12-06 LAB — HM MAMMOGRAPHY

## 2016-12-13 ENCOUNTER — Telehealth: Payer: Self-pay | Admitting: Internal Medicine

## 2016-12-13 NOTE — Telephone Encounter (Signed)
Pt called in and and said that her husband has an appt 2/6 and wants to be worked in around 1:45 with her husband

## 2016-12-14 ENCOUNTER — Encounter: Payer: Self-pay | Admitting: Internal Medicine

## 2016-12-14 ENCOUNTER — Ambulatory Visit (INDEPENDENT_AMBULATORY_CARE_PROVIDER_SITE_OTHER): Payer: Commercial Managed Care - HMO | Admitting: Internal Medicine

## 2016-12-14 ENCOUNTER — Other Ambulatory Visit (INDEPENDENT_AMBULATORY_CARE_PROVIDER_SITE_OTHER): Payer: Commercial Managed Care - HMO

## 2016-12-14 VITALS — BP 140/110 | HR 76 | Temp 98.1°F | Resp 16 | Ht 61.0 in | Wt 235.4 lb

## 2016-12-14 DIAGNOSIS — I10 Essential (primary) hypertension: Secondary | ICD-10-CM

## 2016-12-14 DIAGNOSIS — R0683 Snoring: Secondary | ICD-10-CM

## 2016-12-14 DIAGNOSIS — R1319 Other dysphagia: Secondary | ICD-10-CM

## 2016-12-14 DIAGNOSIS — R1310 Dysphagia, unspecified: Secondary | ICD-10-CM | POA: Diagnosis not present

## 2016-12-14 LAB — URINALYSIS, ROUTINE W REFLEX MICROSCOPIC
Bilirubin Urine: NEGATIVE
HGB URINE DIPSTICK: NEGATIVE
Leukocytes, UA: NEGATIVE
Nitrite: NEGATIVE
RBC / HPF: NONE SEEN (ref 0–?)
Specific Gravity, Urine: >=1.03 — AB (ref 1.000–1.030)
Total Protein, Urine: NEGATIVE
Urine Glucose: NEGATIVE
Urobilinogen, UA: 0.2 (ref 0.0–1.0)
WBC UA: NONE SEEN (ref 0–?)
pH: 5.5 (ref 5.0–8.0)

## 2016-12-14 LAB — CBC WITH DIFFERENTIAL/PLATELET
Basophils Absolute: 0 10*3/uL (ref 0.0–0.1)
Basophils Relative: 0.8 % (ref 0.0–3.0)
Eosinophils Absolute: 0.1 10*3/uL (ref 0.0–0.7)
Eosinophils Relative: 3 % (ref 0.0–5.0)
HCT: 39.3 % (ref 36.0–46.0)
Hemoglobin: 12.5 g/dL (ref 12.0–15.0)
LYMPHS ABS: 2.6 10*3/uL (ref 0.7–4.0)
Lymphocytes Relative: 51.8 % — ABNORMAL HIGH (ref 12.0–46.0)
MCHC: 31.7 g/dL (ref 30.0–36.0)
MCV: 80.7 fl (ref 78.0–100.0)
MONO ABS: 0.5 10*3/uL (ref 0.1–1.0)
Monocytes Relative: 9.3 % (ref 3.0–12.0)
NEUTROS PCT: 35.1 % — AB (ref 43.0–77.0)
Neutro Abs: 1.7 10*3/uL (ref 1.4–7.7)
Platelets: 206 10*3/uL (ref 150.0–400.0)
RBC: 4.87 Mil/uL (ref 3.87–5.11)
RDW: 16.3 % — AB (ref 11.5–15.5)
WBC: 5 10*3/uL (ref 4.0–10.5)

## 2016-12-14 LAB — COMPREHENSIVE METABOLIC PANEL
ALT: 15 U/L (ref 0–35)
AST: 18 U/L (ref 0–37)
Albumin: 4 g/dL (ref 3.5–5.2)
Alkaline Phosphatase: 132 U/L — ABNORMAL HIGH (ref 39–117)
BUN: 12 mg/dL (ref 6–23)
CHLORIDE: 106 meq/L (ref 96–112)
CO2: 28 meq/L (ref 19–32)
Calcium: 9.4 mg/dL (ref 8.4–10.5)
Creatinine, Ser: 1.1 mg/dL (ref 0.40–1.20)
GFR: 66.2 mL/min (ref >60.00)
GLUCOSE: 94 mg/dL (ref 70–99)
Potassium: 4.2 mEq/L (ref 3.5–5.1)
SODIUM: 139 meq/L (ref 135–145)
Total Bilirubin: 0.3 mg/dL (ref 0.2–1.2)
Total Protein: 7.8 g/dL (ref 6.0–8.3)

## 2016-12-14 MED ORDER — NEBIVOLOL HCL 5 MG PO TABS: 5.0000 mg | ORAL_TABLET | Freq: Every day | ORAL | 0 refills | Status: DC

## 2016-12-14 NOTE — Progress Notes (Signed)
Pre visit review using our clinic review tool, if applicable. No additional management support is needed unless otherwise documented below in the visit note. 

## 2016-12-14 NOTE — Progress Notes (Signed)
Subjective:  Patient ID: [name], female    DOB: Nov 17, 1960  Age: 56 y.o. MRN: [id number]  CC: Hypertension   HPI [name] presents for a blood pressure check and concerns about trouble swallowing for about 2 or 3 weeks. She complains that she feels like food gets stuck in her esophagus but when she drinks liquids he gets better. She denies odynophagia. She has also had a few episodes of shortness of breath and wheezing, but she denies cough, chest pain, hemoptysis, edema, or palpitations. Her husband complains about her snoring.  Outpatient Medications Prior to Visit  Medication Sig Dispense Refill  . desonide (DESOWEN) 0.05 % cream Apply topically 2 (two) times daily. 30 g 1  . traMADol (ULTRAM) 50 MG tablet TAKE ONE TABLET BY MOUTH EVERY 6 HOURS AS NEEDED FOR PAIN 100 tablet 5  . triamcinolone cream (KENALOG) 0.5 % Apply topically 3 (three) times daily. 100 g 5  . dexlansoprazole (DEXILANT) 60 MG capsule Take 1 capsule (60 mg total) by mouth daily. 30 capsule 11   No facility-administered medications prior to visit.     ROS Review of Systems  Constitutional: Negative for diaphoresis, fatigue and unexpected weight change.  HENT: Positive for trouble swallowing. Negative for sore throat and voice change.   Eyes: Negative.  Negative for photophobia and visual disturbance.  Respiratory: Positive for shortness of breath and wheezing. Negative for cough, choking, chest tightness and stridor.   Cardiovascular: Negative for chest pain, palpitations and leg swelling.  Gastrointestinal: Negative for abdominal pain, constipation, diarrhea, nausea and vomiting.  Endocrine: Negative.   Genitourinary: Negative.  Negative for decreased urine volume, difficulty urinating, dysuria, frequency and urgency.  Musculoskeletal: Negative.  Negative for back pain, myalgias and neck pain.  Skin: Negative.  Negative for color change and rash.  Neurological: Negative.  Negative for dizziness,  weakness, light-headedness and headaches.  Hematological: Negative for adenopathy. Does not bruise/bleed easily.  Psychiatric/Behavioral: Negative.     Objective:  BP (!) 140/110 (BP Location: Left Arm, Patient Position: Sitting, Cuff Size: Large)   Pulse 76   Temp 98.1 F (36.7 C) (Oral)   Resp 16   Ht 5\' 1"  (1.549 m)   Wt 235 lb 6 oz (106.8 kg)   LMP 05/17/2012   SpO2 98%   BMI 44.47 kg/m   BP Readings from Last 3 Encounters:  12/14/16 (!) 140/110  08/26/16 132/78  05/28/16 123/89    Wt Readings from Last 3 Encounters:  12/14/16 235 lb 6 oz (106.8 kg)  08/26/16 242 lb (109.8 kg)  01/30/16 241 lb (109.3 kg)    Physical Exam  Constitutional: She is oriented to person, place, and time. No distress.  HENT:  Mouth/Throat: Oropharynx is clear and moist. No oropharyngeal exudate.  Eyes: Conjunctivae are normal. Right eye exhibits no discharge. Left eye exhibits no discharge. No scleral icterus.  Neck: Normal range of motion. Neck supple. No JVD present. No tracheal deviation present. No thyromegaly present.  Cardiovascular: Normal rate, regular rhythm, normal heart sounds and intact distal pulses.  Exam reveals no gallop and no friction rub.   No murmur heard. EKG - Sinus  Rhythm  -  Nonspecific T-abnormality.   ABNORMAL   Pulmonary/Chest: Effort normal and breath sounds normal. No stridor. No respiratory distress. She has no wheezes. She has no rales. She exhibits no tenderness.  Abdominal: Soft. Bowel sounds are normal. She exhibits no distension and no mass. There is no tenderness. There is no rebound  and no guarding.  Musculoskeletal: Normal range of motion. She exhibits no edema, tenderness or deformity.  Lymphadenopathy:    She has no cervical adenopathy.  Neurological: She is oriented to person, place, and time.  Skin: Skin is warm and dry. No rash noted. She is not diaphoretic. No erythema. No pallor.  Vitals reviewed.   Lab Results  Component Value Date    WBC 5.0 12/14/2016   HGB 12.5 12/14/2016   HCT 39.3 12/14/2016   PLT 206.0 12/14/2016   GLUCOSE 94 12/14/2016   CHOL 188 08/26/2016   TRIG 70.0 08/26/2016   HDL 46.50 08/26/2016   LDLCALC 128 (H) 08/26/2016   ALT 15 12/14/2016   AST 18 12/14/2016   NA 139 12/14/2016   K 4.2 12/14/2016   CL 106 12/14/2016   CREATININE 1.10 12/14/2016   BUN 12 12/14/2016   CO2 28 12/14/2016   TSH 1.12 08/26/2016    Mm Digital Screening Bilateral  Result Date: 12/06/2016 CLINICAL DATA:  Screening. EXAM: DIGITAL SCREENING BILATERAL MAMMOGRAM WITH CAD COMPARISON:  Previous exam(s). ACR Breast Density Category b: There are scattered areas of fibroglandular density. FINDINGS: There are no findings suspicious for malignancy. Images were processed with CAD. IMPRESSION: No mammographic evidence of malignancy. A result letter of this screening mammogram will be mailed directly to the patient. RECOMMENDATION: Screening mammogram in one year. (Code:SM-B-01Y) BI-RADS CATEGORY  1: Negative. Electronically Signed   By: Dorise Bullion III M.D   On: 12/06/2016 18:32    Assessment & Plan:   [name] was seen today for hypertension.  Diagnoses and all orders for this visit:  Hypertension, unspecified type- her EKG is negative for LVH or signs of ischemia, her labs are negative for end organ damage or secondary causes. I've asked her to consider having a sleep study done to screen for sleep apnea. Will try to control her blood pressure with nebivolol. -     EKG 12-Lead -     CBC with Differential/Platelet; Future -     Comprehensive metabolic panel; Future -     Urinalysis, Routine w reflex microscopic; Future -     nebivolol (BYSTOLIC) 5 MG tablet; Take 1 tablet (5 mg total) by mouth daily.  Esophageal dysphagia- I've asked her to see GI to see if she needs upper endoscopy done to identify the cause of her difficulty swallowing. -     Ambulatory referral to Gastroenterology  Snoring -     Ambulatory referral to  Sleep Studies   I have discontinued [name]'s dexlansoprazole. I am also having her start on nebivolol. Additionally, I am having her maintain her triamcinolone cream, desonide, and traMADol.  Meds ordered this encounter  Medications  . nebivolol (BYSTOLIC) 5 MG tablet    Sig: Take 1 tablet (5 mg total) by mouth daily.    Dispense:  28 tablet    Refill:  0     Follow-up: Return in about 3 weeks (around 01/04/2017).  [name], MD

## 2016-12-14 NOTE — Telephone Encounter (Signed)
Spoke to pcp and we will see pt. Pt was informed and seen today.

## 2016-12-14 NOTE — Patient Instructions (Signed)
Hypertension Hypertension, commonly called high blood pressure, is when the force of blood pumping through your arteries is too strong. Your arteries are the blood vessels that carry blood from your heart throughout your body. A blood pressure reading consists of a higher number over a lower number, such as 110/72. The higher number (systolic) is the pressure inside your arteries when your heart pumps. The lower number (diastolic) is the pressure inside your arteries when your heart relaxes. Ideally you want your blood pressure below 120/80. Hypertension forces your heart to work harder to pump blood. Your arteries may become narrow or stiff. Having untreated or uncontrolled hypertension can cause heart attack, stroke, kidney disease, and other problems. What increases the risk? Some risk factors for high blood pressure are controllable. Others are not. Risk factors you cannot control include:  Race. You may be at higher risk if you are African American.  Age. Risk increases with age.  Gender. Men are at higher risk than women before age 45 years. After age 65, women are at higher risk than men. Risk factors you can control include:  Not getting enough exercise or physical activity.  Being overweight.  Getting too much fat, sugar, calories, or salt in your diet.  Drinking too much alcohol. What are the signs or symptoms? Hypertension does not usually cause signs or symptoms. Extremely high blood pressure (hypertensive crisis) may cause headache, anxiety, shortness of breath, and nosebleed. How is this diagnosed? To check if you have hypertension, your health care provider will measure your blood pressure while you are seated, with your arm held at the level of your heart. It should be measured at least twice using the same arm. Certain conditions can cause a difference in blood pressure between your right and left arms. A blood pressure reading that is higher than normal on one occasion does  not mean that you need treatment. If it is not clear whether you have high blood pressure, you may be asked to return on a different day to have your blood pressure checked again. Or, you may be asked to monitor your blood pressure at home for 1 or more weeks. How is this treated? Treating high blood pressure includes making lifestyle changes and possibly taking medicine. Living a healthy lifestyle can help lower high blood pressure. You may need to change some of your habits. Lifestyle changes may include:  Following the DASH diet. This diet is high in fruits, vegetables, and whole grains. It is low in salt, red meat, and added sugars.  Keep your sodium intake below 2,300 mg per day.  Getting at least 30-45 minutes of aerobic exercise at least 4 times per week.  Losing weight if necessary.  Not smoking.  Limiting alcoholic beverages.  Learning ways to reduce stress. Your health care provider may prescribe medicine if lifestyle changes are not enough to get your blood pressure under control, and if one of the following is true:  You are 18-59 years of age and your systolic blood pressure is above 140.  You are 60 years of age or older, and your systolic blood pressure is above 150.  Your diastolic blood pressure is above 90.  You have diabetes, and your systolic blood pressure is over 140 or your diastolic blood pressure is over 90.  You have kidney disease and your blood pressure is above 140/90.  You have heart disease and your blood pressure is above 140/90. Your personal target blood pressure may vary depending on your medical   conditions, your age, and other factors. Follow these instructions at home:  Have your blood pressure rechecked as directed by your health care provider.  Take medicines only as directed by your health care provider. Follow the directions carefully. Blood pressure medicines must be taken as prescribed. The medicine does not work as well when you skip  doses. Skipping doses also puts you at risk for problems.  Do not smoke.  Monitor your blood pressure at home as directed by your health care provider. Contact a health care provider if:  You think you are having a reaction to medicines taken.  You have recurrent headaches or feel dizzy.  You have swelling in your ankles.  You have trouble with your vision. Get help right away if:  You develop a severe headache or confusion.  You have unusual weakness, numbness, or feel faint.  You have severe chest or abdominal pain.  You vomit repeatedly.  You have trouble breathing. This information is not intended to replace advice given to you by your health care provider. Make sure you discuss any questions you have with your health care provider. Document Released: 10/25/2005 Document Revised: 04/01/2016 Document Reviewed: 08/17/2013 Elsevier Interactive Patient Education  2017 [employer]vier Inc[employer]  

## 2016-12-28 ENCOUNTER — Telehealth: Payer: Self-pay | Admitting: Internal Medicine

## 2016-12-28 DIAGNOSIS — M170 Bilateral primary osteoarthritis of knee: Secondary | ICD-10-CM

## 2016-12-28 NOTE — Telephone Encounter (Signed)
Entered another referral per pt request.   Forwarding to pcp as fyi.

## 2016-12-28 NOTE — Telephone Encounter (Signed)
Pt called request referral go to another orthopedic due to right leg pain. Pt stated she doesn't want to see Dr. Darrick Meigs? (she said he makes it worse). Please advise.

## 2016-12-30 ENCOUNTER — Ambulatory Visit (INDEPENDENT_AMBULATORY_CARE_PROVIDER_SITE_OTHER): Payer: Commercial Managed Care - HMO | Admitting: Neurology

## 2016-12-30 ENCOUNTER — Encounter: Payer: Self-pay | Admitting: Neurology

## 2016-12-30 VITALS — BP 132/80 | HR 70 | Resp 16 | Ht 61.0 in | Wt 236.0 lb

## 2016-12-30 DIAGNOSIS — R0683 Snoring: Secondary | ICD-10-CM | POA: Diagnosis not present

## 2016-12-30 DIAGNOSIS — G4730 Sleep apnea, unspecified: Secondary | ICD-10-CM

## 2016-12-30 DIAGNOSIS — G894 Chronic pain syndrome: Secondary | ICD-10-CM

## 2016-12-30 DIAGNOSIS — G4710 Hypersomnia, unspecified: Secondary | ICD-10-CM

## 2016-12-30 DIAGNOSIS — R519 Headache, unspecified: Secondary | ICD-10-CM

## 2016-12-30 DIAGNOSIS — I10 Essential (primary) hypertension: Secondary | ICD-10-CM | POA: Diagnosis not present

## 2016-12-30 DIAGNOSIS — K5903 Drug induced constipation: Secondary | ICD-10-CM

## 2016-12-30 DIAGNOSIS — R51 Headache: Secondary | ICD-10-CM

## 2016-12-30 DIAGNOSIS — F1190 Opioid use, unspecified, uncomplicated: Secondary | ICD-10-CM

## 2016-12-30 DIAGNOSIS — E669 Obesity, unspecified: Secondary | ICD-10-CM

## 2016-12-30 NOTE — Progress Notes (Signed)
SLEEP MEDICINE CLINIC   Provider:  Larey Seat, M D  Referring Provider: [name], MD Primary Care Physician:  [name], MD  Chief Complaint  Patient presents with  . Sleep Consult    Rm 11. Patient has trouble falling and staying asleep, snores, wakes up feeling tired, morning headaches, falls asleep when sitting still.     HPI:  [name] is a 56 y.o. female , of afro [place] and [place] descent , and seen here as a referral  from [place] [facility], for a sleep evaluation,   [name] reports that she is in constant discomfort sitting or standing due to knee pain. She has seen an orthopedic surgeon she has received injections at the hip, which have not brought her relief. 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 She feels that pain pills have not been helpful, she was sent to physical therapy and completed 12 sessions but did not find relief. However, I explained to her that  she is here today because she has problems sleeping at night . She confirmed  That her husband and other family members have reported her to snore and pauses in her breath. She has a history of super obesity, osteoarthritis, high blood pressure reflux disease, and chronic pain. Her sleep is not sound, and she feels restricted to a supine sleep position due to back and joint pain.  She has weight induced problems, Dr. Charlestine Night confirmed a weight loss of 8 pounds. 5 years ago , while  she was still working and felt happier,her weight was 190 pounds. She just recently underwent metabolic panels with her primary care physician, Dr. Ronnald Ramp, which have returned normal. Her LDL is a little high at 128 the total cholesterol was in normal limits at 188. Creatinine is 1.1 , WBC is 5.0 - Hgb and HCt normal, she's not anemic and she has neither hepatic impairment nor abnormal electrolyte levels.  Sleep habits are as follows:   She goes to bed at  11 PM, she has difficulties falling asleep on some days. She watches TV in bed. She hopes that she will fall asleep while watching. It will take her about an hour of TV watching before she can switch the TV off and go to sleep. She feels hot at night a lot and her bedroom state school, but obviously not quiet and dark with a TV running. She shares a bedroom with her husband, she is forced to sleep supine because of back and joint pain. She uses 1 pillow and her bed does not elevate. She can sleep rather flat on her back.She suffers from a dry mouth, takes a lot of fluid ( water) and has four bathroom breaks at night, but can go back to sleep. She denies RLS.  Her husband reports her tossing and turning. This impairs his ability to stay asleep at night. He is also bothered by her snoring which she has described as thunderous. She gets up between 7:30 and 8 AM and usually has gotten about 6 hours of sleep. Often she wakes up craving more sleep.  Sleep medical history and family sleep history:  She is not aware of any family member having apnea or being diagnosed. There is also no family history of sleepwalking and no personal history. She did not undergo tonsillectomy in childhood has no history of traumatic brain injury, ENT surgery, neck injury.  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  Review of Systems: Out of a complete 14 system review, the patient complains of only the following symptoms, and all other reviewed systems are negative. The patient endorsed tinnitus, obesity but weight loss in the recent weeks, chest pain, shortness of breath, snoring, increased thirst, feeling hot at night, joint pain, aching muscles, cramping, the feeling of change in  appetite, death interest in activities and not ever getting enough sleep, difficulties with swallowing headaches numbness weakness. She also endorsed 9 points on the fatigue severity scale, And 10 points on the Epworth Sleepiness Scale. Please keep in mind that the patient is currently not gainfully employed and does not have the same obligation to rise in the morning and function during the day. She can take naps in daytime that she couldn't take while working. She has been out of work now for 2 years, due to knee pain..    Social History   Social History  . Marital status: Married    Spouse name: N/A  . Number of children: 4  . Years of education: 12   Occupational History  . Not on file.   Social History Main Topics  . Smoking status: Never Smoker  . Smokeless tobacco: Never Used  . Alcohol use No  . Drug use: No  . Sexual activity: Yes    Birth control/ protection: Post-menopausal, Surgical   Other Topics Concern  . Not on file   Social History Narrative   Drinks caffeine once a week     Family History  Problem Relation Age of Onset  . Arthritis Other   . Diabetes Other   . Hypertension Other   . Breast cancer Other   . Hypertension Mother   . Diabetes Sister   . Hypertension Sister   . Cancer Neg Hx   . Early death Neg Hx   . Hearing loss Neg Hx   . Heart disease Neg Hx   . Hyperlipidemia Neg Hx   . Kidney disease Neg Hx   . Stroke Neg Hx   . Colon cancer Neg Hx     Past Medical History:  Diagnosis Date  . Allergy    rhinitis  . Anemia   . Arthritis   . Asthma   . Headache(784.0)   . History of stomach ulcers   . Hypertension   . Internal hemorrhoids     Past Surgical History:  Procedure Laterality Date  . CARPAL TUNNEL RELEASE Right   . TUBAL LIGATION      Current Outpatient Prescriptions  Medication Sig Dispense Refill  . desonide (DESOWEN) 0.05 % cream Apply topically 2 (two) times daily. 30 g 1  . traMADol (ULTRAM) 50 MG tablet TAKE ONE  TABLET BY MOUTH EVERY 6 HOURS AS NEEDED FOR PAIN 100 tablet 5  . triamcinolone cream (KENALOG) 0.5 % Apply topically 3 (three) times daily. 100 g 5   No current facility-administered medications for this visit.     Allergies as of 12/30/2016  . (No Known Allergies)    Vitals: BP 132/80   Pulse 70   Resp 16   Ht 5\' 1"  (1.549 m)   Wt 236 lb (107 kg)   LMP 05/17/2012  BMI 44.59 kg/m  Last Weight:  Wt Readings from Last 1 Encounters:  12/30/16 236 lb (107 kg)   [id number] mass index is 44.59 kg/m.     Last Height:   Ht Readings from Last 1 Encounters:  12/30/16 5\' 1"  (1.549 m)    Physical exam:  General: The patient is awake, alert and appears not in acute distress. The patient is well groomed. Head: Normocephalic, atraumatic. Neck is supple. Mallampati 5, poor dentition, ,  neck circumference:16.25 . Nasal airflow congested , TMJ is  Not  evident . Retrognathia is not seen.  Cardiovascular:  Regular rate and rhythm, without  murmurs or carotid bruit, and without distended neck veins. Respiratory: Lungs are clear to auscultation. Skin:  Without evidence of edema, or rash Trunk: BMI is 45 . The patient's posture is stooped, she is in pain.   Neurologic exam : The patient is awake and alert, oriented to place and time.    Attention span & concentration ability appears limited, she has hearing loss Speech is fluent,  with dysarthria and  dysphonia .  Mood and affect are appropriate.  Cranial nerves: Pupils are equal and briskly reactive to light. Funduscopic exam without  evidence of pallor or edema. Extraocular movements  in vertical and horizontal planes intact and without nystagmus. Visual fields by finger perimetry are intact. Hearing impairment.   Facial sensation intact to fine touch.  Facial motor strength is symmetric and tongue and uvula move midline. Shoulder shrug was symmetrical.   Motor exam: Normal tone, muscle bulk and symmetric strength in upper  Extremities.  I di not evaluate sensory, gait and lower extremity strength as this patient will follow up for SLEEP only.   Coordination: Rapid alternating movements / Finger-to-nose maneuver  normal without evidence of ataxia, dysmetria or tremor.  Gait and station: Patient walks without assistive device   The patient was advised of the nature of the diagnosed sleep disorder , the treatment options and risks for general a health and wellness arising from not treating the condition.  I spent more than  30 minutes of face to face time with the patient. Greater than 50% of time was spent in counseling and coordination of care. We have discussed the diagnosis and differential and I answered the patient's questions.     Assessment:  After physical and neurologic examination, review of laboratory studies,  Personal review of imaging studies, reports of other /same  Imaging studies ,  Results of polysomnography/ neurophysiology testing and pre-existing records as far as provided in visit., my assessment is   1) Mrs. Villena does have a high-grade Mallampati at narrow upper airway with a larger than usual neck circumference and an elevated body mass index. Her body mass index has reached 45 which places her in the super obesity range this is after some weight loss over the last month and weeks. This is also her main risk factor for degenerative joint disease, degenerative back disease, hypertension and obstructive sleep apnea. Weight loss would be the most sustained solution to her sleep problem. The patient also has multiple bathroom breaks but attributed her nocturia to her fluid intake. It may be well related to obstructive sleep apnea. Her family, especially her husband, have witnessed her to snore thunderous sleep and to stop breathing at night. I have no doubt that the patient will test positive for apnea, but the question will be at what degree and how to best treat it. Should she have associated low oxygen levels  or  sleep apnea dependent hypoxemia, she should undergo CPAP titration. I will order a split-night polysomnography. This should be scheduled for March, as she sees her pain doctor on Monday and  needs to start some new treatment allowing her better sleep.    Plan:  Treatment plan and additional workup : SPLIT night with Co2 , capnography.and spw ecial attention to possible oxygen needs.  Super obesity.  Chronic pain affecting sleep. Pain therapy through PCP and orthopedist only.   R wit me after SPLIT.   Asencion Partridge Nomie Buchberger MD  12/30/2016   CC: [name], Md [address] [facility] [address] [place], [place] [address]

## 2017-01-03 ENCOUNTER — Ambulatory Visit (INDEPENDENT_AMBULATORY_CARE_PROVIDER_SITE_OTHER): Payer: Commercial Managed Care - HMO | Admitting: Orthopaedic Surgery

## 2017-01-03 ENCOUNTER — Encounter (INDEPENDENT_AMBULATORY_CARE_PROVIDER_SITE_OTHER): Payer: Self-pay | Admitting: Orthopaedic Surgery

## 2017-01-03 DIAGNOSIS — M25551 Pain in right hip: Secondary | ICD-10-CM

## 2017-01-03 NOTE — Progress Notes (Signed)
Office Visit Note   Patient: [name]           Date of Birth: 1961/06/15           MRN: [id number] Visit Date: 01/03/2017              Requested by: [name], MD [address] [place], [place] [address] PCP: [name], MD   Assessment & Plan: Visit Diagnoses:  1. Pain of right hip joint     Plan: I would like to try an intra-articular right hip injection to see if this will give her better relief. She will give [name] a call back if she is no better in the next up would be to get an MRI of the right hip. I did evaluate the x-rays which does show moderate joint space narrowing.  Follow-Up Instructions: No Follow-up on file.   Orders:  No orders of the defined types were placed in this encounter.  No orders of the defined types were placed in this encounter.     Procedures: No procedures performed   Clinical Data: No additional findings.   Subjective: Chief Complaint  Patient presents with  . Left Hip - Pain  . Right Hip - Pain    She comes in with right hip pain that has been getting worse over the last year. She takes tramadol has not been able to work because of the pain. She did have a trochanteric injection which only gave her one day of relief. She denies any groin pain. She does limp because of the pain. She denies any back pain. Pain does not radiate past the knee. She does have some pain in the anterior thigh    Review of Systems Complete review of systems negative except for history of present illness  Objective: Vital Signs: LMP 05/17/2012   Physical Exam  Constitutional: She is oriented to person, place, and time. She appears well-developed and well-nourished.  HENT:  Head: Normocephalic and atraumatic.  Eyes: EOM are normal.  Neck: Neck supple.  Pulmonary/Chest: Effort normal.  Abdominal: Soft.  Neurological: She is alert and oriented to person, place, and time.  Skin: Skin is warm. Capillary refill takes less than 2  seconds.  Psychiatric: She has a normal mood and affect. Her behavior is normal. Judgment and thought content normal.  Nursing note and vitals reviewed.   Ortho Exam Exam of the right hip shows painless range of motion of the hip. She does have a positive Stinchfield sign. No sciatic tension signs. No focal motor or sensory findings. Specialty Comments:  No specialty comments available.  Imaging: No results found.   PMFS History: Patient Active Problem List   Diagnosis Date Noted  . Snoring 12/14/2016  . Hypertension 12/14/2016  . Gastroesophageal reflux disease with esophagitis 08/26/2016  . Dysphagia 08/26/2016  . Spondylolisthesis of lumbar region 05/28/2016  . Trochanteric bursitis of left hip 02/09/2016  . Trochanteric bursitis of right hip 01/12/2016  . Fibromyalgia 08/08/2015  . Lumbar facet arthropathy 08/08/2015  . Primary osteoarthritis of both knees 08/25/2014  . Routine general medical examination at a health care facility 06/01/2013  . Encounter for screening mammogram for breast cancer 06/01/2013  . Back pain without radiation 08/29/2012  . Constipation 11/15/2011  . ALLERGIC RHINITIS 01/26/2010   Past Medical History:  Diagnosis Date  . Allergy    rhinitis  . Anemia   . Arthritis   . Asthma   . Headache(784.0)   .  History of stomach ulcers   . Hypertension   . Internal hemorrhoids     Family History  Problem Relation Age of Onset  . Arthritis Other   . Diabetes Other   . Hypertension Other   . Breast cancer Other   . Hypertension Mother   . Diabetes Sister   . Hypertension Sister   . Cancer Neg Hx   . Early death Neg Hx   . Hearing loss Neg Hx   . Heart disease Neg Hx   . Hyperlipidemia Neg Hx   . Kidney disease Neg Hx   . Stroke Neg Hx   . Colon cancer Neg Hx     Past Surgical History:  Procedure Laterality Date  . CARPAL TUNNEL RELEASE Right   . TUBAL LIGATION     Social History   Occupational History  . Not on file.   Social  History Main Topics  . Smoking status: Never Smoker  . Smokeless tobacco: Never Used  . Alcohol use No  . Drug use: No  . Sexual activity: Yes    Birth control/ protection: Post-menopausal, Surgical

## 2017-01-04 ENCOUNTER — Ambulatory Visit: Payer: Commercial Managed Care - HMO | Admitting: Internal Medicine

## 2017-01-07 ENCOUNTER — Telehealth (INDEPENDENT_AMBULATORY_CARE_PROVIDER_SITE_OTHER): Payer: Self-pay | Admitting: *Deleted

## 2017-01-07 NOTE — Telephone Encounter (Signed)
Pt called asking if on her appt 3-5 she would be getting a injection or not?

## 2017-01-10 ENCOUNTER — Other Ambulatory Visit (INDEPENDENT_AMBULATORY_CARE_PROVIDER_SITE_OTHER): Payer: Self-pay

## 2017-01-10 ENCOUNTER — Ambulatory Visit (INDEPENDENT_AMBULATORY_CARE_PROVIDER_SITE_OTHER): Payer: Commercial Managed Care - HMO | Admitting: Orthopaedic Surgery

## 2017-01-10 DIAGNOSIS — M25551 Pain in right hip: Secondary | ICD-10-CM

## 2017-01-10 NOTE — Telephone Encounter (Signed)
Called pt to advise

## 2017-01-20 ENCOUNTER — Ambulatory Visit (INDEPENDENT_AMBULATORY_CARE_PROVIDER_SITE_OTHER): Payer: Commercial Managed Care - HMO | Admitting: Neurology

## 2017-01-20 DIAGNOSIS — G4710 Hypersomnia, unspecified: Secondary | ICD-10-CM

## 2017-01-20 DIAGNOSIS — I10 Essential (primary) hypertension: Secondary | ICD-10-CM

## 2017-01-20 DIAGNOSIS — R51 Headache: Secondary | ICD-10-CM

## 2017-01-20 DIAGNOSIS — E669 Obesity, unspecified: Secondary | ICD-10-CM

## 2017-01-20 DIAGNOSIS — G4730 Sleep apnea, unspecified: Secondary | ICD-10-CM | POA: Diagnosis not present

## 2017-01-20 DIAGNOSIS — F1190 Opioid use, unspecified, uncomplicated: Secondary | ICD-10-CM

## 2017-01-20 DIAGNOSIS — K5903 Drug induced constipation: Secondary | ICD-10-CM

## 2017-01-20 DIAGNOSIS — G894 Chronic pain syndrome: Secondary | ICD-10-CM

## 2017-01-20 DIAGNOSIS — R519 Headache, unspecified: Secondary | ICD-10-CM

## 2017-01-20 DIAGNOSIS — R0683 Snoring: Secondary | ICD-10-CM

## 2017-01-24 ENCOUNTER — Telehealth: Payer: Self-pay | Admitting: *Deleted

## 2017-01-24 ENCOUNTER — Other Ambulatory Visit: Payer: Self-pay | Admitting: Internal Medicine

## 2017-01-24 DIAGNOSIS — I10 Essential (primary) hypertension: Secondary | ICD-10-CM

## 2017-01-24 MED ORDER — NEBIVOLOL HCL 5 MG PO TABS: 5.0000 mg | ORAL_TABLET | Freq: Every day | ORAL | 1 refills | Status: DC

## 2017-01-24 NOTE — Telephone Encounter (Signed)
Rec'd call pt states MD gave samples of Bystolic for her BP. Med is working for her and would like rx sent to [facility]. Pls advise med is not on med list.../lmb

## 2017-01-24 NOTE — Telephone Encounter (Signed)
Notified pt MD sent rx to [facility]../lmb 

## 2017-01-24 NOTE — Telephone Encounter (Signed)
RX written 

## 2017-01-26 NOTE — Procedures (Signed)
PATIENT'S NAME:  [name], [name] DOB:      09-Jan-1961      MR#:    [id number]     DATE OF RECORDING: 01/20/2017 REFERRING M.D.:  [name], MD Study Performed:   Baseline Polysomnogram HISTORY: This 56 year old female patient presents with snoring, witnessed apnea and chronic pain. Insomnia due to pain. She is superobese and reports excessive daytime sleepiness, she is taking daytime naps. She is out of work for over 2 years.  Hx: Obesity, Allergy, Anemia, Arthritis, Asthma, Headaches, Hypertension, Ulcers  The patient endorsed the Epworth Sleepiness Scale at 10 points.   The patient's weight 236 pounds with a height of 61 (inches), resulting in a BMI of 44.56  kg/m2. The patient's neck circumference measured 16.25 inches.  CURRENT MEDICATIONS: Ultram  PROCEDURE:  This is a multichannel digital polysomnogram utilizing the Somnostar 11.2 system.  Electrodes and sensors were applied and monitored per AASM Specifications.   EEG, EOG, Chin and Limb EMG, were sampled at 200 Hz.  ECG, Snore and Nasal Pressure, Thermal Airflow, Respiratory Effort, CPAP Flow and Pressure, Oximetry was sampled at 50 Hz. Digital video and audio were recorded.      BASELINE STUDY :  Lights Out was at 22:37 and Lights On at 04:46.  Total recording time (TRT) was 369.5 minutes, with a total sleep time (TST) of 151 minutes.  The patient's sleep latency was 114.5 minutes.  REM latency was 176.5 minutes.  The sleep efficiency was 40.9 %.     SLEEP ARCHITECTURE: WASO (Wake after sleep onset) was 109.5 minutes.  There were 23.5 minutes in Stage N1, 90.5 minutes Stage N2, 30.5 minutes Stage N3 and 6.5 minutes in Stage REM.  The percentage of Stage N1 was 15.6%, Stage N2 was 59.9%, Stage N3 was 20.2% and Stage R (REM sleep) was 4.3%.   RESPIRATORY ANALYSIS:  There were a total of 4 respiratory events:  1 obstructive apnea, 0 central apneas and 3 hypopneas with a hypopnea index of 1.2 /hour. The patient also had 0 respiratory event  related arousals (RERAs). The total APNEA/HYPOPNEA INDEX (AHI) was 1.6/hour and the total RESPIRATORY DISTURBANCE INDEX was 1.6 /hour.  4 events occurred in REM sleep and 0 events in NREM. The REM AHI was 36.9 /hour, versus a non-REM AHI of 0. The patient spent 0 minutes of total sleep time in the supine position and 151 minutes in non-supine. The supine AHI was 0.0 versus a non-supine AHI of 1.6.  OXYGEN SATURATION & C02:  The Wake baseline 02 saturation was 98%, with the lowest being 84%. Time spent below 89% saturation equaled 0.5 minutes.Average End Tidal CO2 during sleep was 35.3 torr.  During REM, the average End Tidal CO2 was 33.5 torr.  During NREM, the average End Tidal CO2 was 35.4 torr.      PERIODIC LIMB MOVEMENTS:   The patient had a total of 0 Periodic Limb Movements.  The arousals were noted as: 63 were spontaneous, 0 were associated with PLMs, and 2 were associated with respiratory events. Audio and video analysis did not show any abnormal or unusual movements, behaviors, phonations or vocalizations.    The patient took 2 bathroom breaks. She had difficulties to initiate sleep and sustain sleep.  Snoring was noted, few hypopneas in REM sleep.  EKG was in sinus rhythm (NSR), p wave was always visible.     IMPRESSION:  1. Insomnia, delayed sleep onset and poorly sustained sleep .  Fragmented sleep due to spontaneous  arousals. Repetitive Intrusions of Sleep.  RECOMMENDATIONS: This patient reports pain to be her main problem at night, interrupting her sleep and making it difficult to find a comfortable sleep position. Spontaneous arousals are those that cannot be attributed to changes in sleep physiology ( heart rate, movements,  oxygen desaturation, apnea and hypopneas)   Please refer to pain management. Consider dedicated sleep psychology referral for insomnia if persistent after pain is addressed. Sleep hygiene should be implemented.     I certify that I have reviewed the entire  raw data recording prior to the issuance of this report in accordance with the Standards of Accreditation of the American Academy of Sleep Medicine (AASM)      Larey Seat, MD  01-26-2017  Diplomat, American Board of Psychiatry and Neurology  Diplomat, American Board of [place] Director, [facility]

## 2017-01-27 ENCOUNTER — Telehealth: Payer: Self-pay

## 2017-01-27 NOTE — Telephone Encounter (Signed)
I called pt. I advised her that her sleep study revealed insomnia, delayed sleep onset and poorly sustained sleep with fragmentation due to spontaneous arousals. Dr. Brett Fairy recommends that pt seek care with a pain management specialist and perhaps a sleep psychology referral for insomnia if her insomnia persists after pain management, and that pt's PCP, Dr. Ronnald Ramp, should address this from here. I discussed sleep hygiene recommendations with the pt. Pt declined a follow up appt with Dr. Brett Fairy but asked that I send a report to Dr. Ronnald Ramp. Pt verbalized understanding of results. Pt had no questions at this time but was encouraged to call back if questions arise.

## 2017-01-27 NOTE — Telephone Encounter (Signed)
-----   Message from Larey Seat, MD sent at 01/26/2017  4:39 PM EDT ----- IMPRESSION:  1. Insomnia, delayed sleep onset and poorly sustained sleep .  Fragmented sleep due to spontaneous arousals. Repetitive Intrusions of Sleep.  RECOMMENDATIONS: This patient reports pain to be her main problem at night, interrupting her sleep and making it difficult to find a comfortable sleep position. Spontaneous arousals are those that cannot be attributed to changes in sleep physiology (heart rate, movements, oxygen desaturation, apnea and hypopneas).   Please refer to pain management. No neurological/ sleep clinic follow up needed.  Consider dedicated sleep psychology referral for insomnia if persistent after pain is addressed. PCP to address from here.   Sleep hygiene should be implemented.     I certify that I have reviewed the entire raw data recording prior to the issuance of this report in accordance with the Standards of Accreditation of the [place] Academy of Sleep Medicine (AASM)

## 2017-01-28 ENCOUNTER — Ambulatory Visit (INDEPENDENT_AMBULATORY_CARE_PROVIDER_SITE_OTHER): Payer: Commercial Managed Care - HMO | Admitting: Physical Medicine and Rehabilitation

## 2017-01-28 ENCOUNTER — Encounter (INDEPENDENT_AMBULATORY_CARE_PROVIDER_SITE_OTHER): Payer: Self-pay | Admitting: Physical Medicine and Rehabilitation

## 2017-01-28 ENCOUNTER — Ambulatory Visit (INDEPENDENT_AMBULATORY_CARE_PROVIDER_SITE_OTHER): Payer: Commercial Managed Care - HMO

## 2017-01-28 VITALS — BP 116/64 | HR 49

## 2017-01-28 DIAGNOSIS — M25552 Pain in left hip: Secondary | ICD-10-CM | POA: Diagnosis not present

## 2017-01-28 MED ORDER — BUPIVACAINE HCL 0.5 % IJ SOLN
3.0000 mL | INTRAMUSCULAR | Status: AC | PRN
  Administered 2017-01-28: 3 mL via INTRA_ARTICULAR

## 2017-01-28 MED ORDER — TRIAMCINOLONE ACETONIDE 40 MG/ML IJ SUSP
80.0000 mg | INTRAMUSCULAR | Status: AC | PRN
  Administered 2017-01-28: 80 mg via INTRA_ARTICULAR

## 2017-01-28 NOTE — Progress Notes (Signed)
[name] - 56 y.o. female MRN [id number]  Date of birth: 1961/05/27  Office Visit Note: Visit Date: 01/28/2017 PCP: [name], MD Referred by: [name], MD  Subjective: Chief Complaint  Patient presents with  . Right Hip - Pain   HPI: [name] is a 56 year old female followed by Dr. Erlinda Hong request right intra-articular anesthetic hip arthrogram. She tells me she is having a lot of right hip pain for over a year with some referral into the top of the thigh. She does state that she gets some groin pain. She reports having had hip injections by Dr. Dianna Limbo that were probably more of the bursa on the lateral side. She says these never helped. She is also having pain on the side on the right where she tries to lay down and is been hurting ever since he did these injections.  ROS Otherwise per HPI.  Assessment & Plan: Visit Diagnoses:  1. Pain in left hip     Plan: Findings:  Right intra-articular anesthetic hip arthrogram. Unfortunately the patient really had difficulty telling me if there is any relief after the injection. She seemed to be ambulating better without a limp. She said her hip felt heavy. When I asked her directly if it felt better she was really just unable to tell me.    Meds & Orders: No orders of the defined types were placed in this encounter.   Orders Placed This Encounter  Procedures  . Large Joint Injection/Arthrocentesis  . XR C-ARM NO REPORT    Follow-up: Return if symptoms worsen or fail to improve, for Dr. Erlinda Hong.   Procedures: Large Joint Inj Date/Time: 01/28/2017 9:47 AM Performed by: Magnus Sinning Authorized by: Magnus Sinning   Consent Given by:  Patient Site marked: the procedure site was marked   Timeout: prior to procedure the correct patient, procedure, and site was verified   Indications:  Pain and diagnostic evaluation Location:  Hip Site:  R hip joint Prep: patient was prepped and draped in usual sterile fashion     Needle Size:  22 G Needle length (in): 5.0 inches. Approach:  Anterior Ultrasound Guidance: No   Fluoroscopic Guidance: No   Arthrogram: Yes   Medications:  3 mL bupivacaine 0.5 %; 80 mg triamcinolone acetonide 40 MG/ML Aspiration Attempted: Yes   Patient tolerance:  Patient tolerated the procedure well with no immediate complications  Arthrogram demonstrated excellent flow of contrast throughout the joint surface without extravasation or obvious defect.  The patient reports not being able to tell much difference after the injection compared to when she came in. She says the hip as she feels like it's heavier. She states that she was limping when she came in and does not appear to be limping very much now.      No notes on file   Clinical History: No specialty comments available.  She reports that she has never smoked. She has never used smokeless tobacco. No results for input(s): HGBA1C, LABURIC in the last 8760 hours.  Objective:  VS:  HT:    WT:   BMI:     BP:116/64  HR:(!) 49bpm  TEMP: ( )  RESP:  Physical Exam  Ortho Exam Imaging: Xr C-arm No Report  Result Date: 01/28/2017 Please see Notes or Procedures tab for imaging impression.   Past Medical/Family/Surgical/Social History: Medications & Allergies reviewed per EMR Patient Active Problem List   Diagnosis Date Noted  . Snoring 12/14/2016  . Hypertension 12/14/2016  .  Gastroesophageal reflux disease with esophagitis 08/26/2016  . Dysphagia 08/26/2016  . Spondylolisthesis of lumbar region 05/28/2016  . Trochanteric bursitis of left hip 02/09/2016  . Trochanteric bursitis of right hip 01/12/2016  . Fibromyalgia 08/08/2015  . Lumbar facet arthropathy 08/08/2015  . Primary osteoarthritis of both knees 08/25/2014  . Routine general medical examination at a health care facility 06/01/2013  . Encounter for screening mammogram for breast cancer 06/01/2013  . Back pain without radiation 08/29/2012  . Constipation  11/15/2011  . ALLERGIC RHINITIS 01/26/2010   Past Medical History:  Diagnosis Date  . Allergy    rhinitis  . Anemia   . Arthritis   . Asthma   . Headache(784.0)   . History of stomach ulcers   . Hypertension   . Internal hemorrhoids    Family History  Problem Relation Age of Onset  . Arthritis Other   . Diabetes Other   . Hypertension Other   . Breast cancer Other   . Hypertension Mother   . Diabetes Sister   . Hypertension Sister   . Cancer Neg Hx   . Early death Neg Hx   . Hearing loss Neg Hx   . Heart disease Neg Hx   . Hyperlipidemia Neg Hx   . Kidney disease Neg Hx   . Stroke Neg Hx   . Colon cancer Neg Hx    Past Surgical History:  Procedure Laterality Date  . CARPAL TUNNEL RELEASE Right   . TUBAL LIGATION     Social History   Occupational History  . Not on file.   Social History Main Topics  . Smoking status: Never Smoker  . Smokeless tobacco: Never Used  . Alcohol use No  . Drug use: No  . Sexual activity: Yes    Birth control/ protection: Post-menopausal, Surgical

## 2017-01-28 NOTE — Patient Instructions (Signed)

## 2017-02-02 ENCOUNTER — Ambulatory Visit: Payer: Commercial Managed Care - HMO | Admitting: Internal Medicine

## 2017-02-17 ENCOUNTER — Encounter (INDEPENDENT_AMBULATORY_CARE_PROVIDER_SITE_OTHER): Payer: Self-pay | Admitting: Orthopedic Surgery

## 2017-02-17 ENCOUNTER — Ambulatory Visit (INDEPENDENT_AMBULATORY_CARE_PROVIDER_SITE_OTHER): Payer: Commercial Managed Care - HMO | Admitting: Orthopedic Surgery

## 2017-02-17 VITALS — Ht 61.0 in | Wt 236.0 lb

## 2017-02-17 DIAGNOSIS — M25551 Pain in right hip: Secondary | ICD-10-CM | POA: Diagnosis not present

## 2017-02-17 NOTE — Progress Notes (Signed)
Office Visit Note   Patient: [name]           Date of Birth: 03-23-1961           MRN: [id number] Visit Date: 02/17/2017              Requested by: [name], MD [address] [place], [place] [address] PCP: [name], MD  Chief Complaint  Patient presents with  . Right Hip - Pain      HPI: Patient is a 56 year old woman complains of chronic pain in both hips right worse than left. She was seen by Dr. Ernestina Patches and underwent a intra-articular injection of her right hip she states this did make her hip better but not 100% better. She states that she takes tramadol when necessary and this helps a little as well.  Assessment & Plan: Visit Diagnoses:  1. Pain of right hip joint     Plan: We'll request an MRI scan to further identify the cause of her pathology. She may have avascular necrosis. Follow-up with Dr. Erlinda Hong after the MRI is obtained.  Follow-Up Instructions: Return if symptoms worsen or fail to improve.   Ortho Exam  Patient is alert, oriented, no adenopathy, well-dressed, normal affect, normal respiratory effort. Patient has an abductor lurch on the right. She has internal and external rotation of 45 of the right hip and this is not painful. She has a negative straight leg raise. Calf is soft nontender no evidence of DVT  Imaging: No results found.  Labs: Lab Results  Component Value Date   ESRSEDRATE 21 05/17/2012   REPTSTATUS 08/28/2009 FINAL 08/27/2009   GRAMSTAIN NO WBC SEEN NO ORGANISMS SEEN 08/27/2009   CULT NO GROWTH 3 DAYS 08/27/2009    Orders:  No orders of the defined types were placed in this encounter.  No orders of the defined types were placed in this encounter.    Procedures: No procedures performed  Clinical Data: No additional findings.  ROS:  All other systems negative, except as noted in the HPI. Review of Systems  Objective: Vital Signs: Ht 5\' 1"  (1.549 m)   Wt 236 lb (107 kg)   LMP 05/17/2012    BMI 44.59 kg/m   Specialty Comments:  No specialty comments available.  PMFS History: Patient Active Problem List   Diagnosis Date Noted  . Pain of right hip joint 02/17/2017  . Snoring 12/14/2016  . Hypertension 12/14/2016  . Gastroesophageal reflux disease with esophagitis 08/26/2016  . Dysphagia 08/26/2016  . Spondylolisthesis of lumbar region 05/28/2016  . Trochanteric bursitis of left hip 02/09/2016  . Trochanteric bursitis of right hip 01/12/2016  . Fibromyalgia 08/08/2015  . Lumbar facet arthropathy 08/08/2015  . Primary osteoarthritis of both knees 08/25/2014  . Routine general medical examination at a health care facility 06/01/2013  . Encounter for screening mammogram for breast cancer 06/01/2013  . Back pain without radiation 08/29/2012  . Constipation 11/15/2011  . ALLERGIC RHINITIS 01/26/2010   Past Medical History:  Diagnosis Date  . Allergy    rhinitis  . Anemia   . Arthritis   . Asthma   . Headache(784.0)   . History of stomach ulcers   . Hypertension   . Internal hemorrhoids     Family History  Problem Relation Age of Onset  . Arthritis Other   . Diabetes Other   . Hypertension Other   . Breast cancer Other   . Hypertension Mother   .  Diabetes Sister   . Hypertension Sister   . Cancer Neg Hx   . Early death Neg Hx   . Hearing loss Neg Hx   . Heart disease Neg Hx   . Hyperlipidemia Neg Hx   . Kidney disease Neg Hx   . Stroke Neg Hx   . Colon cancer Neg Hx     Past Surgical History:  Procedure Laterality Date  . CARPAL TUNNEL RELEASE Right   . TUBAL LIGATION     Social History   Occupational History  . Not on file.   Social History Main Topics  . Smoking status: Never Smoker  . Smokeless tobacco: Never Used  . Alcohol use No  . Drug use: No  . Sexual activity: Yes    Birth control/ protection: Post-menopausal, Surgical

## 2017-03-02 ENCOUNTER — Ambulatory Visit
Admission: RE | Admit: 2017-03-02 | Discharge: 2017-03-02 | Disposition: A | Discharge status: Home / Self Care | Payer: Commercial Managed Care - HMO | Source: Ambulatory Visit | Attending: Orthopedic Surgery | Admitting: Orthopedic Surgery

## 2017-03-02 DIAGNOSIS — M25551 Pain in right hip: Secondary | ICD-10-CM

## 2017-03-04 ENCOUNTER — Ambulatory Visit: Payer: Commercial Managed Care - HMO | Admitting: Podiatry

## 2017-03-07 ENCOUNTER — Encounter (INDEPENDENT_AMBULATORY_CARE_PROVIDER_SITE_OTHER): Payer: Self-pay | Admitting: Orthopaedic Surgery

## 2017-03-07 ENCOUNTER — Ambulatory Visit (INDEPENDENT_AMBULATORY_CARE_PROVIDER_SITE_OTHER): Payer: Commercial Managed Care - HMO | Admitting: Orthopaedic Surgery

## 2017-03-07 DIAGNOSIS — M25551 Pain in right hip: Secondary | ICD-10-CM | POA: Diagnosis not present

## 2017-03-07 NOTE — Progress Notes (Signed)
Office Visit Note   Patient: [name]           Date of Birth: 1960/11/23           MRN: [id number] Visit Date: 03/07/2017              Requested by: [name], MD [address], [place] [address] PCP: [name], MD   Assessment & Plan: Visit Diagnoses:  1. Pain of right hip joint     Plan: MRI of the hip is essentially normal. Recommend MRI of the lumbar spine this point to assess for spinal stenosis. Follow-up after the MRI.  Follow-Up Instructions: Return in about 2 weeks (around 03/21/2017).   Orders:  No orders of the defined types were placed in this encounter.  No orders of the defined types were placed in this encounter.     Procedures: No procedures performed   Clinical Data: No additional findings.   Subjective: Chief Complaint  Patient presents with  . Right Hip - Pain    [name] returns today to review MRI. She did hip injection. She also complains of left hip pain. She's also complaining of pain with increasing ambulation and having to sit down and symptoms that are concerning for neurogenic claudication.    Review of Systems   Objective: Vital Signs: LMP 05/17/2012   Physical Exam  Ortho Exam Exam is unchanged. Specialty Comments:  No specialty comments available.  Imaging: No results found.   PMFS History: Patient Active Problem List   Diagnosis Date Noted  . Pain of right hip joint 02/17/2017  . Snoring 12/14/2016  . Hypertension 12/14/2016  . Gastroesophageal reflux disease with esophagitis 08/26/2016  . Dysphagia 08/26/2016  . Spondylolisthesis of lumbar region 05/28/2016  . Trochanteric bursitis of left hip 02/09/2016  . Trochanteric bursitis of right hip 01/12/2016  . Fibromyalgia 08/08/2015  . Lumbar facet arthropathy ([place]) 08/08/2015  . Primary osteoarthritis of both knees 08/25/2014  . Routine general medical examination at a health care facility 06/01/2013  . Encounter for screening  mammogram for breast cancer 06/01/2013  . Back pain without radiation 08/29/2012  . Constipation 11/15/2011  . ALLERGIC RHINITIS 01/26/2010   Past Medical History:  Diagnosis Date  . Allergy    rhinitis  . Anemia   . Arthritis   . Asthma   . Headache(784.0)   . History of stomach ulcers   . Hypertension   . Internal hemorrhoids     Family History  Problem Relation Age of Onset  . Arthritis Other   . Diabetes Other   . Hypertension Other   . Breast cancer Other   . Hypertension Mother   . Diabetes Sister   . Hypertension Sister   . Cancer Neg Hx   . Early death Neg Hx   . Hearing loss Neg Hx   . Heart disease Neg Hx   . Hyperlipidemia Neg Hx   . Kidney disease Neg Hx   . Stroke Neg Hx   . Colon cancer Neg Hx     Past Surgical History:  Procedure Laterality Date  . CARPAL TUNNEL RELEASE Right   . TUBAL LIGATION     Social History   Occupational History  . Not on file.   Social History Main Topics  . Smoking status: Never Smoker  . Smokeless tobacco: Never Used  . Alcohol use No  . Drug use: No  . Sexual activity: Yes    Birth control/  protection: Post-menopausal, Surgical

## 2017-03-07 NOTE — Addendum Note (Signed)
Addended by: Precious Bard on: 03/07/2017 02:03 PM   Modules accepted: Orders

## 2017-03-08 ENCOUNTER — Ambulatory Visit (INDEPENDENT_AMBULATORY_CARE_PROVIDER_SITE_OTHER): Payer: Commercial Managed Care - HMO

## 2017-03-08 ENCOUNTER — Ambulatory Visit: Payer: Commercial Managed Care - HMO | Admitting: Internal Medicine

## 2017-03-08 ENCOUNTER — Encounter: Payer: Self-pay | Admitting: Sports Medicine

## 2017-03-08 ENCOUNTER — Ambulatory Visit (INDEPENDENT_AMBULATORY_CARE_PROVIDER_SITE_OTHER): Payer: Commercial Managed Care - HMO | Admitting: Sports Medicine

## 2017-03-08 DIAGNOSIS — B359 Dermatophytosis, unspecified: Secondary | ICD-10-CM | POA: Diagnosis not present

## 2017-03-08 DIAGNOSIS — M79671 Pain in right foot: Secondary | ICD-10-CM

## 2017-03-08 DIAGNOSIS — L74513 Primary focal hyperhidrosis, soles: Secondary | ICD-10-CM

## 2017-03-08 MED ORDER — ALUMINUM CHLORIDE 20 % EX SOLN: Freq: Every day | CUTANEOUS | 3 refills | Status: DC

## 2017-03-08 MED ORDER — CLOTRIMAZOLE 1 % EX SOLN: 1.0000 "application " | Freq: Two times a day (BID) | CUTANEOUS | 5 refills | Status: DC

## 2017-03-08 MED ORDER — CLOTRIMAZOLE-BETAMETHASONE 1-0.05 % EX CREA: 1.0000 "application " | TOPICAL_CREAM | Freq: Two times a day (BID) | CUTANEOUS | 3 refills | Status: DC

## 2017-03-08 NOTE — Progress Notes (Signed)
Subjective: [name] is a 56 y.o. female patient who presents to office for evaluation of R>L foot pain. Patient complains of progressive pain especially over the last 3 months in the R>L foot at the itchy rash. Patient has tried ezcma lotion with no relief in symptoms. Patient denies any other pedal complaints.   Patient Active Problem List   Diagnosis Date Noted  . Pain of right hip joint 02/17/2017  . Snoring 12/14/2016  . Hypertension 12/14/2016  . Gastroesophageal reflux disease with esophagitis 08/26/2016  . Dysphagia 08/26/2016  . Spondylolisthesis of lumbar region 05/28/2016  . Trochanteric bursitis of left hip 02/09/2016  . Trochanteric bursitis of right hip 01/12/2016  . Fibromyalgia 08/08/2015  . Lumbar facet arthropathy ([place]) 08/08/2015  . Primary osteoarthritis of both knees 08/25/2014  . Routine general medical examination at a health care facility 06/01/2013  . Encounter for screening mammogram for breast cancer 06/01/2013  . Back pain without radiation 08/29/2012  . Constipation 11/15/2011  . ALLERGIC RHINITIS 01/26/2010    Current Outpatient Prescriptions on File Prior to Visit  Medication Sig Dispense Refill  . desonide (DESOWEN) 0.05 % cream Apply topically 2 (two) times daily. 30 g 1  . nebivolol (BYSTOLIC) 5 MG tablet Take 1 tablet (5 mg total) by mouth daily. 90 tablet 1  . traMADol (ULTRAM) 50 MG tablet TAKE ONE TABLET BY MOUTH EVERY 6 HOURS AS NEEDED FOR PAIN 100 tablet 5  . triamcinolone cream (KENALOG) 0.5 % Apply topically 3 (three) times daily. 100 g 5   No current facility-administered medications on file prior to visit.     No Known Allergies  Objective:  General: Alert and oriented x3 in no acute distress  Dermatology: Rash is moccasin distrubution R>L, cNo open lesions bilateral lower extremities, no webspace macerations, no ecchymosis bilateral, all nails x 10 are well manicured.  Vascular: Dorsalis Pedis and Posterior Tibial pedal  pulses palpable, Capillary Fill Time 3 seconds,(+) pedal hair growth bilateral, no edema bilateral lower extremities, Temperature gradient within normal limits. Mild increased moisture to both feet resembling hyperhidrosis.   Neurology: Gross sensation intact via light touch bilateral,. (- )Tinels sign bilateral.   Musculoskeletal: No tenderness to tinea. Strength within normal limits in all groups bilateral.   Gait: Antalgic gait  Xrays  Right Foot mild inferior and posterior spur, midtarsal breach suggestive of pes planus; no other acute findings.    Impression:  Assessment and Plan: Problem List Items Addressed This Visit    None    Visit Diagnoses    Right foot pain    -  Primary   Relevant Orders   DG Foot Complete Right (Completed)   Tinea       Relevant Medications   clotrimazole (LOTRIMIN) 1 % external solution   clotrimazole-betamethasone (LOTRISONE) cream   Sweaty feet       Relevant Medications   aluminum chloride (DRYSOL) 20 % external solution       -Complete examination performed -Xrays reviewed -Discussed treatement options for tinea and sweaty feet -Rx Clotrimazole sol interdigitally and clotrimazole cream to skin and drysol at bedtime for hydrosis -Recommend good hygiene habits and good supportive shoes  -Patient to return to office in 6 weeks for med check or sooner if condition worsens. Advised patient if not better may try oral lamisil after review of bloodwork.   Landis Martins, DPM

## 2017-03-09 ENCOUNTER — Telehealth: Payer: Self-pay | Admitting: *Deleted

## 2017-03-09 NOTE — Telephone Encounter (Addendum)
Pt called 2 minutes apart, left no DOB or contact number. Pt called and I spoke with her she states she was given 3 creams and could only get one, and has an insurance card that will pay of the medications if a prescription is written, but [facility] does have but the cream clotrimazole betamethasone. I told pt to ask the [facility] to order the medication from another of their pharmacies. 03/15/2017-Left message informing pre-cert of the clotrimazole solution would cause a delay in her treatment, so changed to a medication her insurance would cover, clotrimazole cream. I spoke with the pt and informed her of the change.

## 2017-03-10 ENCOUNTER — Telehealth: Payer: Self-pay | Admitting: *Deleted

## 2017-03-10 ENCOUNTER — Other Ambulatory Visit: Payer: Self-pay | Admitting: Internal Medicine

## 2017-03-10 DIAGNOSIS — M5126 Other intervertebral disc displacement, lumbar region: Secondary | ICD-10-CM

## 2017-03-10 MED ORDER — TRAMADOL HCL 50 MG PO TABS: 50.0000 mg | ORAL_TABLET | Freq: Four times a day (QID) | ORAL | 5 refills | Status: DC | PRN

## 2017-03-10 NOTE — Telephone Encounter (Signed)
Notified pt MD ok refill verified pharmacy faxed script manually to [place] [facility]...Johny Chess

## 2017-03-10 NOTE — Telephone Encounter (Signed)
Rec'd call pt requesting refill on her Tramadol.../lmb 

## 2017-03-10 NOTE — Telephone Encounter (Signed)
done

## 2017-03-11 ENCOUNTER — Ambulatory Visit: Payer: Commercial Managed Care - HMO | Admitting: Podiatry

## 2017-03-14 ENCOUNTER — Ambulatory Visit
Admission: RE | Admit: 2017-03-14 | Discharge: 2017-03-14 | Disposition: A | Discharge status: Home / Self Care | Payer: Commercial Managed Care - HMO | Source: Ambulatory Visit | Attending: Orthopaedic Surgery | Admitting: Orthopaedic Surgery

## 2017-03-14 DIAGNOSIS — M25551 Pain in right hip: Secondary | ICD-10-CM

## 2017-03-15 MED ORDER — CLOTRIMAZOLE 1 % EX CREA: 1.0000 "application " | TOPICAL_CREAM | Freq: Two times a day (BID) | CUTANEOUS | 5 refills | Status: DC

## 2017-03-15 NOTE — Addendum Note (Signed)
Addended by: Harriett Sine D on: 03/15/2017 03:39 PM   Modules accepted: Orders

## 2017-03-16 ENCOUNTER — Telehealth: Payer: Self-pay | Admitting: Internal Medicine

## 2017-03-16 DIAGNOSIS — N939 Abnormal uterine and vaginal bleeding, unspecified: Secondary | ICD-10-CM

## 2017-03-16 NOTE — Telephone Encounter (Signed)
Referral entered and patient informed of PCP response.

## 2017-03-16 NOTE — Telephone Encounter (Signed)
Pt called and said that she started spotting last night and it has continued through today. She did not know what she needed to do. Please advise.

## 2017-03-16 NOTE — Telephone Encounter (Signed)
Go see a GYN doctor

## 2017-03-18 ENCOUNTER — Telehealth (INDEPENDENT_AMBULATORY_CARE_PROVIDER_SITE_OTHER): Payer: Self-pay | Admitting: Orthopaedic Surgery

## 2017-03-18 NOTE — Telephone Encounter (Signed)
[place] 10

## 2017-03-18 NOTE — Telephone Encounter (Signed)
PT CALLED AND ASKED IF WE CAN GIVE HER A STRONGER PAIN MED DUE TO HER HAVING MORE PAIN AND BURNING SENSATIONS NOW. PT ALSO STATED IT HURTS HER TO WALK.  [phone]

## 2017-03-18 NOTE — Telephone Encounter (Signed)
Please advise 

## 2017-03-21 ENCOUNTER — Ambulatory Visit (INDEPENDENT_AMBULATORY_CARE_PROVIDER_SITE_OTHER): Payer: Commercial Managed Care - HMO | Admitting: Orthopaedic Surgery

## 2017-03-21 ENCOUNTER — Encounter (INDEPENDENT_AMBULATORY_CARE_PROVIDER_SITE_OTHER): Payer: Self-pay

## 2017-03-21 ENCOUNTER — Encounter (INDEPENDENT_AMBULATORY_CARE_PROVIDER_SITE_OTHER): Payer: Self-pay | Admitting: Orthopaedic Surgery

## 2017-03-21 DIAGNOSIS — M4696 Unspecified inflammatory spondylopathy, lumbar region: Secondary | ICD-10-CM | POA: Diagnosis not present

## 2017-03-21 DIAGNOSIS — M47816 Spondylosis without myelopathy or radiculopathy, lumbar region: Secondary | ICD-10-CM

## 2017-03-21 NOTE — Progress Notes (Signed)
Office Visit Note   Patient: [name]           Date of Birth: 10-04-1961           MRN: [id number] Visit Date: 03/21/2017              Requested by: [name], MD [address] [place], [place] [address] PCP: [name], MD   Assessment & Plan: Visit Diagnoses:  1. Pain of right hip joint   2. Lumbar facet arthropathy (HCC)     Plan: MRI shows mild facet disease with mild foraminal narrowing at L4-L5. With a mild disc bulge. At this point we will send her over to Dr. Ernestina Patches for consideration of epidural steroid injection. Follow up with me as needed. She did have an MRI of her right hip previously which was essentially normal.  Follow-Up Instructions: Return if symptoms worsen or fail to improve.   Orders:  Orders Placed This Encounter  Procedures  . Ambulatory referral to Physical Medicine Rehab   No orders of the defined types were placed in this encounter.     Procedures: No procedures performed   Clinical Data: No additional findings.   Subjective: Chief Complaint  Patient presents with  . Lower Back - Pain, Follow-up    Patient comes back to review her MRI. She continues to have right-sided leg pain.    Review of Systems   Objective: Vital Signs: LMP 05/17/2012   Physical Exam  Ortho Exam Exam is stable. Specialty Comments:  No specialty comments available.  Imaging: No results found.   PMFS History: Patient Active Problem List   Diagnosis Date Noted  . Pain of right hip joint 02/17/2017  . Snoring 12/14/2016  . Hypertension 12/14/2016  . Gastroesophageal reflux disease with esophagitis 08/26/2016  . Dysphagia 08/26/2016  . Spondylolisthesis of lumbar region 05/28/2016  . Trochanteric bursitis of left hip 02/09/2016  . Trochanteric bursitis of right hip 01/12/2016  . Fibromyalgia 08/08/2015  . Lumbar facet arthropathy ([place]) 08/08/2015  . Primary osteoarthritis of both knees 08/25/2014  . Routine  general medical examination at a health care facility 06/01/2013  . Encounter for screening mammogram for breast cancer 06/01/2013  . Back pain without radiation 08/29/2012  . Constipation 11/15/2011  . ALLERGIC RHINITIS 01/26/2010   Past Medical History:  Diagnosis Date  . Allergy    rhinitis  . Anemia   . Arthritis   . Asthma   . Headache(784.0)   . History of stomach ulcers   . Hypertension   . Internal hemorrhoids     Family History  Problem Relation Age of Onset  . Arthritis Other   . Diabetes Other   . Hypertension Other   . Breast cancer Other   . Hypertension Mother   . Diabetes Sister   . Hypertension Sister   . Cancer Neg Hx   . Early death Neg Hx   . Hearing loss Neg Hx   . Heart disease Neg Hx   . Hyperlipidemia Neg Hx   . Kidney disease Neg Hx   . Stroke Neg Hx   . Colon cancer Neg Hx     Past Surgical History:  Procedure Laterality Date  . CARPAL TUNNEL RELEASE Right   . TUBAL LIGATION     Social History   Occupational History  . Not on file.   Social History Main Topics  . Smoking status: Never Smoker  . Smokeless tobacco: Never Used  .  Alcohol use No  . Drug use: No  . Sexual activity: Yes    Birth control/ protection: Post-menopausal, Surgical

## 2017-03-21 NOTE — Telephone Encounter (Signed)
She came in for her appt today and Dr Erlinda Hong said he would refer her to get inj with [facility] and would like to hold off from the pain meds

## 2017-03-24 ENCOUNTER — Telehealth: Payer: Self-pay | Admitting: Obstetrics & Gynecology

## 2017-03-24 NOTE — Telephone Encounter (Signed)
Called and left a message for patient to call back to schedule a new patient doctor referral for abnormal vaginal bleeding. Patient picked up the call and requested a call back on Monday to schedule.

## 2017-03-30 ENCOUNTER — Encounter (INDEPENDENT_AMBULATORY_CARE_PROVIDER_SITE_OTHER): Payer: Commercial Managed Care - HMO | Admitting: Physical Medicine and Rehabilitation

## 2017-04-07 ENCOUNTER — Encounter (INDEPENDENT_AMBULATORY_CARE_PROVIDER_SITE_OTHER): Payer: Commercial Managed Care - HMO | Admitting: Physical Medicine and Rehabilitation

## 2017-04-13 ENCOUNTER — Telehealth: Payer: Self-pay | Admitting: Obstetrics and Gynecology

## 2017-04-13 NOTE — Telephone Encounter (Signed)
Patient canceled her NGYN/ referral appointment 04/15/17 via automated reminder call. I left patient a message to call and reschedule.

## 2017-04-15 ENCOUNTER — Ambulatory Visit: Payer: Commercial Managed Care - HMO | Admitting: Obstetrics and Gynecology

## 2017-04-19 ENCOUNTER — Ambulatory Visit: Payer: Commercial Managed Care - HMO | Admitting: Sports Medicine

## 2017-04-20 NOTE — Telephone Encounter (Signed)
Patient declined to schedule an appointment. Referral routed back to referring office.

## 2017-06-27 ENCOUNTER — Telehealth: Payer: Self-pay | Admitting: Internal Medicine

## 2017-06-27 DIAGNOSIS — I10 Essential (primary) hypertension: Secondary | ICD-10-CM

## 2017-06-27 MED ORDER — NEBIVOLOL HCL 5 MG PO TABS: 5.0000 mg | ORAL_TABLET | Freq: Every day | ORAL | 0 refills | Status: DC

## 2017-06-27 NOTE — Telephone Encounter (Signed)
Called pt no answer LMOM will send 30 day supply only must make f/u appt for future refills...[name]

## 2017-06-27 NOTE — Telephone Encounter (Addendum)
Pt called for a refill of her nebivolol (BYSTOLIC) 5 MG tablet  Last CPE 08/2016 Had appt set up for 2018 for a CPE had to cancel due to no [facility] adviser on [place]

## 2017-06-28 ENCOUNTER — Encounter: Payer: Commercial Managed Care - HMO | Admitting: Internal Medicine

## 2017-06-30 MED ORDER — NEBIVOLOL HCL 5 MG PO TABS: 5.0000 mg | ORAL_TABLET | Freq: Every day | ORAL | 0 refills | Status: DC

## 2017-06-30 NOTE — Addendum Note (Signed)
Addended by: Earnstine Regal on: 06/30/2017 12:15 PM   Modules accepted: Orders

## 2017-06-30 NOTE — Telephone Encounter (Signed)
Pt called stating that she went to pick up her prescription and it was very expensive. She wanted to know if Dr Ronnald Ramp would have any samples of this that she could have. She will have new insurance the first of September and has a physical scheduled on September 11th.

## 2017-06-30 NOTE — Telephone Encounter (Signed)
Notified pt will leave samples up front for pick-up.Marland KitchenJohny Berry

## 2017-07-19 ENCOUNTER — Encounter: Payer: Commercial Managed Care - HMO | Admitting: Internal Medicine

## 2017-07-27 ENCOUNTER — Telehealth: Payer: Self-pay | Admitting: Internal Medicine

## 2017-07-27 ENCOUNTER — Other Ambulatory Visit: Payer: Self-pay | Admitting: Internal Medicine

## 2017-07-27 NOTE — Telephone Encounter (Signed)
traMADol Veatrice Bourbon) 50 MG tablet   [place], [place] - [address] [phone] (Phone) [phone] (Fax)   Patient is requesting a refill.

## 2017-07-27 NOTE — Telephone Encounter (Signed)
I'm not able to check [place] database due to changing on website to see when was last filled date.Marland KitchenJohny Berry

## 2017-07-27 NOTE — Telephone Encounter (Signed)
I think this is too early

## 2017-07-27 NOTE — Telephone Encounter (Signed)
Pt called and was told response, she does not have any left and would like to know if this is going to be filled or not? Please advise

## 2017-07-28 ENCOUNTER — Other Ambulatory Visit: Payer: Self-pay | Admitting: Internal Medicine

## 2017-07-28 DIAGNOSIS — M5126 Other intervertebral disc displacement, lumbar region: Secondary | ICD-10-CM

## 2017-07-28 MED ORDER — TRAMADOL HCL 50 MG PO TABS: 50.0000 mg | ORAL_TABLET | Freq: Four times a day (QID) | ORAL | 5 refills | Status: DC | PRN

## 2017-07-28 NOTE — Telephone Encounter (Signed)
Patient has called again. She really would like a call back from the nurse.

## 2017-07-28 NOTE — Telephone Encounter (Signed)
RX written 

## 2017-07-28 NOTE — Telephone Encounter (Signed)
rx faxed to [facility].  

## 2017-07-28 NOTE — Telephone Encounter (Signed)
Call pharmacy and pharmacist stated the following: 07/05/2017 a 25 day supply dispensed (#100)  Last rx written on 03/10/2017 with 5 refills was received and pt has used all 5 refills.   (note - I was not able to access the controlled substance website).

## 2018-01-12 ENCOUNTER — Other Ambulatory Visit: Payer: Self-pay | Admitting: Internal Medicine

## 2018-01-12 DIAGNOSIS — M5126 Other intervertebral disc displacement, lumbar region: Secondary | ICD-10-CM

## 2018-01-12 NOTE — Telephone Encounter (Signed)
Copied from [place]. Topic: Quick Communication - See Telephone Encounter >> Jan 12, 2018  4:38 PM Vernona Rieger wrote: CRM for notification. See Telephone encounter for:   01/12/18.  traMADol Veatrice Bourbon) 50 MG tablet  [place], [place] - [address]

## 2018-01-13 ENCOUNTER — Other Ambulatory Visit: Payer: Self-pay | Admitting: Internal Medicine

## 2018-01-13 DIAGNOSIS — M5126 Other intervertebral disc displacement, lumbar region: Secondary | ICD-10-CM

## 2018-01-13 MED ORDER — TRAMADOL HCL 50 MG PO TABS: 50.0000 mg | ORAL_TABLET | Freq: Four times a day (QID) | ORAL | 5 refills | Status: DC | PRN

## 2018-01-13 NOTE — Telephone Encounter (Signed)
LOV 12/14/17 Dr. Sandford Craze [place]

## 2018-02-04 ENCOUNTER — Other Ambulatory Visit: Payer: Self-pay

## 2018-02-04 ENCOUNTER — Emergency Department (HOSPITAL_COMMUNITY)
Admission: EM | Admit: 2018-02-04 | Discharge: 2018-02-04 | Disposition: A | Discharge status: Home / Self Care | Payer: Commercial Managed Care - HMO | Attending: Emergency Medicine | Admitting: Emergency Medicine

## 2018-02-04 ENCOUNTER — Encounter (HOSPITAL_COMMUNITY): Payer: Self-pay

## 2018-02-04 DIAGNOSIS — R42 Dizziness and giddiness: Secondary | ICD-10-CM

## 2018-02-04 DIAGNOSIS — Z9114 Patient's other noncompliance with medication regimen: Secondary | ICD-10-CM | POA: Insufficient documentation

## 2018-02-04 DIAGNOSIS — I10 Essential (primary) hypertension: Secondary | ICD-10-CM | POA: Insufficient documentation

## 2018-02-04 DIAGNOSIS — J45909 Unspecified asthma, uncomplicated: Secondary | ICD-10-CM | POA: Insufficient documentation

## 2018-02-04 LAB — BASIC METABOLIC PANEL
ANION GAP: 10 (ref 5–15)
BUN: 16 mg/dL (ref 6–20)
CHLORIDE: 105 mmol/L (ref 101–111)
CO2: 24 mmol/L (ref 22–32)
Calcium: 9.1 mg/dL (ref 8.9–10.3)
Creatinine, Ser: 1.06 mg/dL — ABNORMAL HIGH (ref 0.44–1.00)
GFR calc non Af Amer: 58 mL/min — ABNORMAL LOW (ref >60)
Glucose, Bld: 86 mg/dL (ref 65–99)
Potassium: 3.8 mmol/L (ref 3.5–5.1)
Sodium: 139 mmol/L (ref 135–145)

## 2018-02-04 LAB — CBC
HEMATOCRIT: 40.2 % (ref 36.0–46.0)
Hemoglobin: 12.8 g/dL (ref 12.0–15.0)
MCH: 26.8 pg (ref 26.0–34.0)
MCHC: 31.8 g/dL (ref 30.0–36.0)
MCV: 84.3 fL (ref 78.0–100.0)
Platelets: 235 10*3/uL (ref 150–400)
RBC: 4.77 MIL/uL (ref 3.87–5.11)
RDW: 15.6 % — AB (ref 11.5–15.5)
WBC: 6.2 10*3/uL (ref 4.0–10.5)

## 2018-02-04 MED ORDER — MECLIZINE HCL 25 MG PO TABS
25.0000 mg | ORAL_TABLET | Freq: Once | ORAL | Status: AC
  Administered 2018-02-04: 25 mg via ORAL
  Filled 2018-02-04: qty 1

## 2018-02-04 MED ORDER — VERAPAMIL HCL ER 120 MG PO TBCR: 120.0000 mg | EXTENDED_RELEASE_TABLET | Freq: Every day | ORAL | 0 refills | Status: DC

## 2018-02-04 MED ORDER — MECLIZINE HCL 25 MG PO TABS: 25.0000 mg | ORAL_TABLET | Freq: Three times a day (TID) | ORAL | 0 refills | Status: DC | PRN

## 2018-02-04 NOTE — ED Notes (Signed)
ED Provider at bedside. 

## 2018-02-04 NOTE — Discharge Instructions (Signed)
Take the medication prescribed as needed for dizziness.  Also take the blood pressure medication starting today or tomorrow.  Make sure that you get up slowly as the new blood pressure medication may cause lightheadedness.  Call Dr. Ronnald Ramp on Monday, February 06, 2018 to arrange to get your blood pressure rechecked in a week.

## 2018-02-04 NOTE — ED Notes (Signed)
Patient came to the ED for dizziness, has not taken blood pressure medication for the last three months due to lack of financial resources. Patient also complains of bilateral hip pain from arthritis, takes Tramadol for the pain and it does not help much.  Patient walked in the room and felt dizzy after 3-4 steps, loses balance and needs support on left side.  Patient uses has a cane but does not use it and she did not bring it to to the ED.

## 2018-02-04 NOTE — ED Triage Notes (Signed)
Patient complains of intermittent positional dizziness and states that she has not taken her BP meds x 3 months due to financial reasons. Also complains of recurrent left hip pain that she refers to as her arthritis. No neuro deficits, alert and oriented

## 2018-02-04 NOTE — ED Provider Notes (Addendum)
[facility] EMERGENCY DEPARTMENT Provider Note   CSN: [id number] Arrival date & time: 02/04/18  0347     History   Chief Complaint Chief Complaint  Patient presents with  . Hypertension    HPI [name] is a 57 y.o. female.  Complains of dizziness meaning sensation of room spinning worse with changing positions onset yesterday..  She also complains of left hip pain for several months which she states is due to arthritis pain is unchanged today pain is worse with walking and improved with remaining still.  Dizziness is worse with changing positions and improved with remaining still.  She denies visual changes or diplopia no focal numbness or weakness or difficulty speaking.  No other associated symptoms patient reports she is been out of her blood pressure medicine for 3 months he cannot afford it.  HPI  Past Medical History:  Diagnosis Date  . Allergy    rhinitis  . Anemia   . Arthritis   . Asthma   . Headache(784.0)   . History of stomach ulcers   . Hypertension   . Internal hemorrhoids     Patient Active Problem List   Diagnosis Date Noted  . Pain of right hip joint 02/17/2017  . Snoring 12/14/2016  . Hypertension 12/14/2016  . Gastroesophageal reflux disease with esophagitis 08/26/2016  . Dysphagia 08/26/2016  . Spondylolisthesis of lumbar region 05/28/2016  . Trochanteric bursitis of left hip 02/09/2016  . Trochanteric bursitis of right hip 01/12/2016  . Fibromyalgia 08/08/2015  . Lumbar facet arthropathy 08/08/2015  . Primary osteoarthritis of both knees 08/25/2014  . Routine general medical examination at a health care facility 06/01/2013  . Encounter for screening mammogram for breast cancer 06/01/2013  . Back pain without radiation 08/29/2012  . Constipation 11/15/2011  . ALLERGIC RHINITIS 01/26/2010    Past Surgical History:  Procedure Laterality Date  . CARPAL TUNNEL RELEASE Right   . TUBAL LIGATION       OB History    None      Home Medications    Prior to Admission medications   Medication Sig Start Date End Date Taking? Authorizing Provider  traMADol (ULTRAM) 50 MG tablet Take 1 tablet (50 mg total) by mouth every 6 (six) hours as needed. for pain 01/13/18  Yes [name], MD  triamcinolone cream (KENALOG) 0.5 % Apply topically 3 (three) times daily. 11/26/15  Yes [name], MD  aluminum chloride (DRYSOL) 20 % external solution Apply topically at bedtime. Patient not taking: Reported on 02/04/2018 03/08/17   Landis Martins, DPM  clotrimazole (LOTRIMIN) 1 % cream Apply 1 application topically 2 (two) times daily. Patient not taking: Reported on 02/04/2018 03/15/17   Landis Martins, DPM  clotrimazole-betamethasone (LOTRISONE) cream Apply 1 application topically 2 (two) times daily. Patient not taking: Reported on 02/04/2018 03/08/17   Landis Martins, DPM  desonide (DESOWEN) 0.05 % cream Apply topically 2 (two) times daily. Patient not taking: Reported on 02/04/2018 12/04/15   Plotnikov, Evie Lacks, MD  meclizine (ANTIVERT) 25 MG tablet Take 1 tablet (25 mg total) by mouth 3 (three) times daily as needed for dizziness. 02/04/18   Orlie Dakin, MD  nebivolol (BYSTOLIC) 5 MG tablet Take 1 tablet (5 mg total) by mouth daily. Follow-up appt is due  must see provider for future refills Patient not taking: Reported on 02/04/2018 06/30/17   [name], MD  verapamil (CALAN-SR) 120 MG CR tablet Take 1 tablet (120 mg total) by mouth  at bedtime. 02/04/18   Orlie Dakin, MD    Family History Family History  Problem Relation Age of Onset  . Arthritis Other   . Diabetes Other   . Hypertension Other   . Breast cancer Other   . Hypertension Mother   . Diabetes Sister   . Hypertension Sister   . Cancer Neg Hx   . Early death Neg Hx   . Hearing loss Neg Hx   . Heart disease Neg Hx   . Hyperlipidemia Neg Hx   . Kidney disease Neg Hx   . Stroke Neg Hx   . Colon cancer Neg Hx     Social  History Social History   Tobacco Use  . Smoking status: Never Smoker  . Smokeless tobacco: Never Used  Substance Use Topics  . Alcohol use: No    Alcohol/week: 0.0 oz  . Drug use: No     Allergies   Patient has no known allergies.   Review of Systems Review of Systems  Constitutional: Negative.   HENT: Negative.   Respiratory: Negative.   Cardiovascular: Negative.   Gastrointestinal: Negative.   Musculoskeletal: Positive for arthralgias.  Skin: Negative.   Neurological: Positive for dizziness.  Psychiatric/Behavioral: Negative.   All other systems reviewed and are negative.    Physical Exam Updated Vital Signs BP (!) 146/87 (BP Location: Right Arm)   Pulse (!) 104   Temp 98.7 F (37.1 C)   Resp 17   LMP 05/17/2012   SpO2 100%   Physical Exam  Constitutional: She is oriented to person, place, and time. She appears well-developed and well-nourished.  HENT:  Head: Normocephalic and atraumatic.  Eyes: Pupils are equal, round, and reactive to light. Conjunctivae are normal.  Neck: Neck supple. No tracheal deviation present. No thyromegaly present.  Cardiovascular: Normal rate and regular rhythm.  No murmur heard. Pulmonary/Chest: Effort normal and breath sounds normal.  Abdominal: Soft. Bowel sounds are normal. She exhibits no distension. There is no tenderness.  Obese  Musculoskeletal: Normal range of motion. She exhibits no edema or tenderness.  All 4 extremities without redness swelling or tenderness neurovascular intact  Neurological: She is alert and oriented to person, place, and time. Coordination normal.  Finger to nose normal Romberg normal pronator drift normal gait mildly unsteady.  She becomes vertiginous upon sitting up from a supine position.  Gait slightly unsteady.  Walks with minimal assistance.  Skin: Skin is warm and dry. No rash noted.  Psychiatric: She has a normal mood and affect.  Nursing note and vitals reviewed.    ED Treatments /  Results  Labs (all labs ordered are listed, but only abnormal results are displayed) Labs Reviewed  BASIC METABOLIC PANEL - Abnormal; Notable for the following components:      Result Value   Creatinine, Ser 1.06 (*)    GFR calc non Af Amer 58 (*)    All other components within normal limits  CBC - Abnormal; Notable for the following components:   RDW 15.6 (*)    All other components within normal limits    EKG None  EKG Interpretation  Date/Time:  Saturday February 04 2018 09:38:53 EDT Ventricular Rate:  83 PR Interval:  142 QRS Duration: 86 QT Interval:  358 QTC Calculation: 420 R Axis:   32 Text Interpretation:  Normal sinus rhythm Nonspecific T wave abnormality Abnormal ECG No significant change since last tracing Confirmed by Orlie Dakin [phone]) on 02/04/2018 4:59:42 PM  Radiology No results found.  Procedures Procedures (including critical care time)  Medications Ordered in ED Medications  meclizine (ANTIVERT) tablet 25 mg (25 mg Oral Given 02/04/18 1559)    Results for orders placed or performed during the hospital encounter of 79/89/21  Basic metabolic panel  Result Value Ref Range   Sodium 139 135 - 145 mmol/L   Potassium 3.8 3.5 - 5.1 mmol/L   Chloride 105 101 - 111 mmol/L   CO2 24 22 - 32 mmol/L   Glucose, Bld 86 65 - 99 mg/dL   BUN 16 6 - 20 mg/dL   Creatinine, Ser 1.06 (H) 0.44 - 1.00 mg/dL   Calcium 9.1 8.9 - 10.3 mg/dL   GFR calc non Af Amer 58 (L) >60 mL/min   GFR calc Af Amer >60 >60 mL/min   Anion gap 10 5 - 15  CBC  Result Value Ref Range   WBC 6.2 4.0 - 10.5 K/uL   RBC 4.77 3.87 - 5.11 MIL/uL   Hemoglobin 12.8 12.0 - 15.0 g/dL   HCT 40.2 36.0 - 46.0 %   MCV 84.3 78.0 - 100.0 fL   MCH 26.8 26.0 - 34.0 pg   MCHC 31.8 30.0 - 36.0 g/dL   RDW 15.6 (H) 11.5 - 15.5 %   Platelets 235 150 - 400 K/uL   No results found. Initial Impression / Assessment and Plan / ED Course  I have reviewed the triage vital signs and the nursing  notes.  Pertinent labs & imaging results that were available during my care of the patient were reviewed by me and considered in my medical decision making (see chart for details).     4:40 PM dizziness improved after treatment with meclizine.  Gait is now steady.  Walks unassisted  Vertigo likely peripheral etiology.  No other associated neurologic symptoms.  Only stroke risk factors include obesity and hypertension.  Plan prescription for Calan SR, meclizine.  Follow-up with Dr. Ronnald Ramp  Final Clinical Impressions(s) / ED Diagnoses  Diagnosis #1 vertigo #2 hypertension #3 medication noncompliance Final diagnoses:  Vertigo    ED Discharge Orders        Ordered    verapamil (CALAN-SR) 120 MG CR tablet  Daily at bedtime     02/04/18 1523    meclizine (ANTIVERT) 25 MG tablet  3 times daily PRN     02/04/18 1653       Orlie Dakin, MD 02/04/18 [place], Keshaun Dubey, MD 02/04/18 [phone]

## 2018-02-08 ENCOUNTER — Encounter: Payer: Self-pay | Admitting: Internal Medicine

## 2018-02-08 ENCOUNTER — Ambulatory Visit (INDEPENDENT_AMBULATORY_CARE_PROVIDER_SITE_OTHER): Payer: Self-pay | Admitting: Internal Medicine

## 2018-02-08 VITALS — BP 124/80 | HR 75 | Temp 97.8°F | Ht 61.0 in | Wt 253.1 lb

## 2018-02-08 DIAGNOSIS — R519 Headache, unspecified: Secondary | ICD-10-CM | POA: Insufficient documentation

## 2018-02-08 DIAGNOSIS — R270 Ataxia, unspecified: Secondary | ICD-10-CM

## 2018-02-08 DIAGNOSIS — R51 Headache: Secondary | ICD-10-CM

## 2018-02-08 DIAGNOSIS — I10 Essential (primary) hypertension: Secondary | ICD-10-CM

## 2018-02-08 NOTE — Progress Notes (Signed)
Subjective:  Patient ID: [name], female    DOB: May 22, 1961  Age: 57 y.o. MRN: [id number]  CC: Hyperlipidemia and Headache   HPI ANGLA DELAHUNT presents for f/up - she complains of a headache that has been intermittent for several months.  He complains of intermittent ataxia for 2 weeks and about 4 days ago she was seen in the ED for dizziness and vertigo.  She has taken meclizine and tells me the dizziness and vertigo has improved.  She is taking a calcium channel blocker for her blood pressure and she tells me her blood pressure has been well controlled.  Outpatient Medications Prior to Visit  Medication Sig Dispense Refill  . aluminum chloride (DRYSOL) 20 % external solution Apply topically at bedtime. 60 mL 3  . meclizine (ANTIVERT) 25 MG tablet Take 1 tablet (25 mg total) by mouth 3 (three) times daily as needed for dizziness. 20 tablet 0  . traMADol (ULTRAM) 50 MG tablet Take 1 tablet (50 mg total) by mouth every 6 (six) hours as needed. for pain 100 tablet 5  . verapamil (CALAN-SR) 120 MG CR tablet Take 1 tablet (120 mg total) by mouth at bedtime. 30 tablet 0  . clotrimazole (LOTRIMIN) 1 % cream Apply 1 application topically 2 (two) times daily. 30 g 5  . clotrimazole-betamethasone (LOTRISONE) cream Apply 1 application topically 2 (two) times daily. 30 g 3  . desonide (DESOWEN) 0.05 % cream Apply topically 2 (two) times daily. 30 g 1  . nebivolol (BYSTOLIC) 5 MG tablet Take 1 tablet (5 mg total) by mouth daily. Follow-up appt is due  must see provider for future refills 28 tablet 0  . triamcinolone cream (KENALOG) 0.5 % Apply topically 3 (three) times daily. 100 g 5   No facility-administered medications prior to visit.     ROS Review of Systems  Constitutional: Negative.  Negative for appetite change, diaphoresis, fatigue and unexpected weight change.  HENT: Negative.   Eyes: Negative for visual disturbance.  Respiratory: Negative for cough, chest tightness, shortness  of breath and wheezing.   Cardiovascular: Negative.  Negative for chest pain, palpitations and leg swelling.  Gastrointestinal: Negative for abdominal pain, nausea and vomiting.  Endocrine: Negative.   Genitourinary: Negative.  Negative for difficulty urinating and dysuria.  Musculoskeletal: Positive for gait problem. Negative for arthralgias and myalgias.  Neurological: Positive for dizziness and headaches. Negative for tremors, seizures, syncope, speech difficulty, weakness, light-headedness and numbness.  Hematological: Negative for adenopathy. Does not bruise/bleed easily.  Psychiatric/Behavioral: Negative.     Objective:  BP 124/80 (BP Location: Left Arm, Patient Position: Sitting, Cuff Size: Large)   Pulse 75   Temp 97.8 F (36.6 C) (Oral)   Ht 5\' 1"  (1.549 m)   Wt 253 lb 2 oz (114.8 kg)   LMP 05/17/2012   SpO2 97%   BMI 47.83 kg/m   BP Readings from Last 3 Encounters:  02/08/18 124/80  02/04/18 (!) 146/87  01/28/17 116/64    Wt Readings from Last 3 Encounters:  02/08/18 253 lb 2 oz (114.8 kg)  02/17/17 236 lb (107 kg)  12/30/16 236 lb (107 kg)    Physical Exam  Constitutional: She is oriented to person, place, and time. No distress.  HENT:  Head: Normocephalic and atraumatic.  Eyes: Pupils are equal, round, and reactive to light. Conjunctivae and EOM are normal.  Neck: Normal range of motion. Neck supple. No JVD present. No thyromegaly present.  Cardiovascular: Normal rate, regular rhythm and  normal heart sounds. Exam reveals no gallop and no friction rub.  No murmur heard. Pulmonary/Chest: Effort normal and breath sounds normal. No respiratory distress. She has no wheezes. She has no rales.  Abdominal: Soft. Bowel sounds are normal. She exhibits no distension and no mass. There is no tenderness. There is no guarding.  Musculoskeletal: Normal range of motion. She exhibits no edema, tenderness or deformity.  Lymphadenopathy:    She has no cervical adenopathy.    Neurological: She is alert and oriented to person, place, and time. She displays no atrophy, no tremor and normal reflexes. No cranial nerve deficit. She exhibits normal muscle tone. She displays no seizure activity. Coordination and gait abnormal. She displays no Babinski's sign on the right side. She displays no Babinski's sign on the left side.  Reflex Scores:      Tricep reflexes are 1+ on the right side and 1+ on the left side.      Bicep reflexes are 1+ on the right side and 1+ on the left side.      Brachioradialis reflexes are 1+ on the right side and 1+ on the left side.      Patellar reflexes are 1+ on the right side and 1+ on the left side.      Achilles reflexes are 1+ on the right side and 1+ on the left side. Skin: Skin is warm and dry. No rash noted. She is not diaphoretic. No erythema. No pallor.  Psychiatric: She has a normal mood and affect. Her behavior is normal. Judgment and thought content normal.  Vitals reviewed.   Lab Results  Component Value Date   WBC 6.2 02/04/2018   HGB 12.8 02/04/2018   HCT 40.2 02/04/2018   PLT 235 02/04/2018   GLUCOSE 86 02/04/2018   CHOL 188 08/26/2016   TRIG 70.0 08/26/2016   HDL 46.50 08/26/2016   LDLCALC 128 (H) 08/26/2016   ALT 15 12/14/2016   AST 18 12/14/2016   NA 139 02/04/2018   K 3.8 02/04/2018   CL 105 02/04/2018   CREATININE 1.06 (H) 02/04/2018   BUN 16 02/04/2018   CO2 24 02/04/2018   TSH 1.12 08/26/2016    No results found.  Assessment & Plan:   Thara was seen today for hyperlipidemia and headache.  Diagnoses and all orders for this visit:  Essential hypertension- Her blood pressure is well controlled on verapamil.  Intractable episodic headache, unspecified headache type- I have ordered an MRI of her brain to screen for CVA, mass, hydrocephalus, or bleed. -     MR Brain Wo Contrast; Future  Ataxia- as above -     MR Brain Wo Contrast; Future   I have discontinued Avianna P. Forget's triamcinolone  cream, desonide, clotrimazole-betamethasone, clotrimazole, and nebivolol. I am also having her maintain her aluminum chloride, traMADol, verapamil, and meclizine.  No orders of the defined types were placed in this encounter.    Follow-up: Return in about 1 month (around 03/08/2018).  [name], MD

## 2018-02-08 NOTE — Patient Instructions (Signed)

## 2018-02-14 ENCOUNTER — Encounter: Payer: Commercial Managed Care - HMO | Admitting: Internal Medicine

## 2018-02-21 ENCOUNTER — Other Ambulatory Visit: Payer: Self-pay

## 2018-03-02 ENCOUNTER — Encounter: Payer: Self-pay | Admitting: Internal Medicine

## 2018-03-07 ENCOUNTER — Telehealth: Payer: Self-pay | Admitting: Internal Medicine

## 2018-03-07 MED ORDER — VERAPAMIL HCL ER 120 MG PO TBCR: 120.0000 mg | EXTENDED_RELEASE_TABLET | Freq: Every day | ORAL | 0 refills | Status: DC

## 2018-03-07 NOTE — Telephone Encounter (Signed)
Refill for verapamil 120 MG CR tablet, has a historical provider (Dr. Winfred Leeds)  Prescription was ordered on 02/04/18 for #30 tablets  Provider  Dr. [name] [facility]  [place]  02/08/18 NOV  04/05/18  Please review.

## 2018-03-07 NOTE — Telephone Encounter (Signed)
Reviewed chart pt is up-to-date. Med was given by the Ed provider sent refills to pof.Marland KitchenJohny Chess

## 2018-03-07 NOTE — Telephone Encounter (Signed)
Copied from [place] [id number]. Topic: Quick Communication - Rx Refill/Question >> Mar 07, 2018 10:29 AM Aurelio Brash B wrote: Medication: verapamil (CALAN-SR) 120 MG CR tablet   Has the patient contacted their pharmacy? Yes   (Agent: If no, request that the patient contact the pharmacy for the refill.)  Preferred Pharmacy (with phone number or street name):   [place], [place] - [address] [phone] (Phone) [phone] (Fax)     Agent: Please be advised that RX refills may take up to 3 business days. We ask that you follow-up with your pharmacy.

## 2018-03-11 ENCOUNTER — Encounter (HOSPITAL_COMMUNITY): Payer: Self-pay | Admitting: Emergency Medicine

## 2018-03-11 ENCOUNTER — Ambulatory Visit (HOSPITAL_COMMUNITY)
Admission: EM | Admit: 2018-03-11 | Discharge: 2018-03-11 | Disposition: A | Discharge status: Home / Self Care | Payer: Self-pay | Attending: Family Medicine | Admitting: Family Medicine

## 2018-03-11 ENCOUNTER — Other Ambulatory Visit: Payer: Self-pay

## 2018-03-11 DIAGNOSIS — J4521 Mild intermittent asthma with (acute) exacerbation: Secondary | ICD-10-CM

## 2018-03-11 MED ORDER — IPRATROPIUM-ALBUTEROL 0.5-2.5 (3) MG/3ML IN SOLN
RESPIRATORY_TRACT | Status: AC
  Filled 2018-03-11: qty 3

## 2018-03-11 MED ORDER — IPRATROPIUM-ALBUTEROL 0.5-2.5 (3) MG/3ML IN SOLN
3.0000 mL | Freq: Once | RESPIRATORY_TRACT | Status: AC
  Administered 2018-03-11: 3 mL via RESPIRATORY_TRACT

## 2018-03-11 MED ORDER — ALBUTEROL SULFATE HFA 108 (90 BASE) MCG/ACT IN AERS: 2.0000 | INHALATION_SPRAY | RESPIRATORY_TRACT | 1 refills | Status: AC | PRN

## 2018-03-11 MED ORDER — PREDNISONE 20 MG PO TABS: ORAL_TABLET | ORAL | 0 refills | Status: DC

## 2018-03-11 NOTE — ED Triage Notes (Signed)
Cough for a week, beige phlegm.  Patient has chest congestion

## 2018-03-11 NOTE — ED Provider Notes (Signed)
[facility]   [id number] 03/11/18 Arrival Time: 1214   SUBJECTIVE:  [name] is a 57 y.o. female who presents to the urgent care with complaint of Cough for a week, beige phlegm.  Patient has chest congestion  Positive history of asthma  Past Medical History:  Diagnosis Date  . Allergy    rhinitis  . Anemia   . Arthritis   . Asthma   . Headache(784.0)   . History of stomach ulcers   . Hypertension   . Internal hemorrhoids    Family History  Problem Relation Age of Onset  . Arthritis Other   . Diabetes Other   . Hypertension Other   . Breast cancer Other   . Hypertension Mother   . Diabetes Sister   . Hypertension Sister   . Cancer Neg Hx   . Early death Neg Hx   . Hearing loss Neg Hx   . Heart disease Neg Hx   . Hyperlipidemia Neg Hx   . Kidney disease Neg Hx   . Stroke Neg Hx   . Colon cancer Neg Hx    Social History   Socioeconomic History  . Marital status: Married    Spouse name: Not on file  . Number of children: 4  . Years of education: 47  . Highest education level: Not on file  Occupational History  . Not on file  Social Needs  . Financial resource strain: Not on file  . Food insecurity:    Worry: Not on file    Inability: Not on file  . Transportation needs:    Medical: Not on file    Non-medical: Not on file  Tobacco Use  . Smoking status: Never Smoker  . Smokeless tobacco: Never Used  Substance and Sexual Activity  . Alcohol use: No    Alcohol/week: 0.0 oz  . Drug use: No  . Sexual activity: Yes    Birth control/protection: Post-menopausal, Surgical  Lifestyle  . Physical activity:    Days per week: Not on file    Minutes per session: Not on file  . Stress: Not on file  Relationships  . Social connections:    Talks on phone: Not on file    Gets together: Not on file    Attends religious service: Not on file    Active member of club or organization: Not on file    Attends meetings of clubs or organizations: Not  on file    Relationship status: Not on file  . Intimate partner violence:    Fear of current or ex partner: Not on file    Emotionally abused: Not on file    Physically abused: Not on file    Forced sexual activity: Not on file  Other Topics Concern  . Not on file  Social History Narrative   Drinks caffeine once a week    No outpatient medications have been marked as taking for the 03/11/18 encounter [facility] Encounter).   No Known Allergies    ROS: As per HPI, remainder of ROS negative.   OBJECTIVE:   Vitals:   03/11/18 1308  BP: (!) 167/100  Pulse: 91  Resp: (!) 24  Temp: 98.1 F (36.7 C)  TempSrc: Oral  SpO2: 94%     General appearance: alert; no distress Eyes: PERRL; EOMI; conjunctiva normal HENT: normocephalic; atraumatic; TMs normal, canal normal, external ears normal without trauma; nasal mucosa normal; oral mucosa normal Neck: supple Lungs: clear to auscultation bilaterally Heart: regular  rate and rhythm Abdomen: soft, non-tender; bowel sounds normal; no masses or organomegaly; no guarding or rebound tenderness Back: no CVA tenderness Extremities: no cyanosis or edema; symmetrical with no gross deformities Skin: warm and dry Neurologic: normal gait; grossly normal Psychological: alert and cooperative; normal mood and affect      Labs:  Results for orders placed or performed during the hospital encounter of 53/66/44  Basic metabolic panel  Result Value Ref Range   Sodium 139 135 - 145 mmol/L   Potassium 3.8 3.5 - 5.1 mmol/L   Chloride 105 101 - 111 mmol/L   CO2 24 22 - 32 mmol/L   Glucose, Bld 86 65 - 99 mg/dL   BUN 16 6 - 20 mg/dL   Creatinine, Ser 1.06 (H) 0.44 - 1.00 mg/dL   Calcium 9.1 8.9 - 10.3 mg/dL   GFR calc non Af Amer 58 (L) >60 mL/min   GFR calc Af Amer >60 >60 mL/min   Anion gap 10 5 - 15  CBC  Result Value Ref Range   WBC 6.2 4.0 - 10.5 K/uL   RBC 4.77 3.87 - 5.11 MIL/uL   Hemoglobin 12.8 12.0 - 15.0 g/dL   HCT 40.2 36.0 -  46.0 %   MCV 84.3 78.0 - 100.0 fL   MCH 26.8 26.0 - 34.0 pg   MCHC 31.8 30.0 - 36.0 g/dL   RDW 15.6 (H) 11.5 - 15.5 %   Platelets 235 150 - 400 K/uL    Labs Reviewed - No data to display  No results found.     ASSESSMENT & PLAN:  1. Mild intermittent asthma with exacerbation     Meds ordered this encounter  Medications  . ipratropium-albuterol (DUONEB) 0.5-2.5 (3) MG/3ML nebulizer solution 3 mL  . albuterol (PROVENTIL HFA;VENTOLIN HFA) 108 (90 Base) MCG/ACT inhaler    Sig: Inhale 2 puffs into the lungs every 4 (four) hours as needed for wheezing or shortness of breath (cough, shortness of breath or wheezing.).    Dispense:  1 Inhaler    Refill:  1  . predniSONE (DELTASONE) 20 MG tablet    Sig: Two daily with food    Dispense:  10 tablet    Refill:  0    Reviewed expectations re: course of current medical issues. Questions answered. Outlined signs and symptoms indicating need for more acute intervention. Patient verbalized understanding. After Visit Summary given.    Procedures:  Improved aeration after nebulizer     Robyn Haber, MD 03/11/18 1338

## 2018-04-03 ENCOUNTER — Encounter (HOSPITAL_COMMUNITY): Payer: Self-pay | Admitting: Family Medicine

## 2018-04-03 ENCOUNTER — Ambulatory Visit (INDEPENDENT_AMBULATORY_CARE_PROVIDER_SITE_OTHER): Payer: Self-pay

## 2018-04-03 ENCOUNTER — Ambulatory Visit (HOSPITAL_COMMUNITY)
Admission: EM | Admit: 2018-04-03 | Discharge: 2018-04-03 | Disposition: A | Discharge status: Home / Self Care | Payer: Self-pay | Attending: Family Medicine | Admitting: Family Medicine

## 2018-04-03 DIAGNOSIS — R0602 Shortness of breath: Secondary | ICD-10-CM

## 2018-04-03 DIAGNOSIS — I10 Essential (primary) hypertension: Secondary | ICD-10-CM

## 2018-04-03 DIAGNOSIS — R609 Edema, unspecified: Secondary | ICD-10-CM

## 2018-04-03 LAB — POCT I-STAT, CHEM 8
BUN: 16 mg/dL (ref 6–20)
CALCIUM ION: 1.12 mmol/L — AB (ref 1.15–1.40)
Chloride: 109 mmol/L (ref 101–111)
Creatinine, Ser: 1.3 mg/dL — ABNORMAL HIGH (ref 0.44–1.00)
Glucose, Bld: 78 mg/dL (ref 65–99)
HCT: 38 % (ref 36.0–46.0)
HEMOGLOBIN: 12.9 g/dL (ref 12.0–15.0)
Potassium: 3.8 mmol/L (ref 3.5–5.1)
SODIUM: 143 mmol/L (ref 135–145)
TCO2: 22 mmol/L (ref 22–32)

## 2018-04-03 MED ORDER — FUROSEMIDE 20 MG PO TABS: 20.0000 mg | ORAL_TABLET | Freq: Every day | ORAL | 0 refills | Status: DC

## 2018-04-03 NOTE — ED Triage Notes (Signed)
Pt with hx of HTN  here for bilateral foot swelling since Friday. sts also that her eczema is flared up. Denies any chest pain, SOB.

## 2018-04-03 NOTE — Discharge Instructions (Addendum)
Avoid salt in foods Elevate legs above level of heart Take the furosemide once a day for 3-5 days After this take one a day as needed for swelling See your PCP in a couple of weeks Eat potassium foods like orange juice and bananas

## 2018-04-03 NOTE — ED Provider Notes (Signed)
[facility]    CSN: [id number] Arrival date & time: 04/03/18  1125     History   Chief Complaint Chief Complaint  Patient presents with  . Leg Swelling    HPI [name] is a 57 y.o. female.   HPI  Patient has had increased swelling in her feet and ankles for the last week to 2. She has not had any change in her diet.  She is not on a low-salt diet.  She has hypertension.  She states she is compliant with her verapamil.  She states her blood pressure is well controlled.  She is never had any chest pain or pressure.  She is never had congestive heart failure.  She does not have any swelling across her abdomen, in her hands, or shortness of breath.  Denies chest pain.  Denies heart disease. She feels like the increased swelling has caused the eczema on her lower legs and feet to flareup.  She does have increased rash and it is weeping somewhat.  We discussed that the pedal edema will cause her rash to be more moist.  Past Medical History:  Diagnosis Date  . Allergy    rhinitis  . Anemia   . Arthritis   . Asthma   . Headache(784.0)   . History of stomach ulcers   . Hypertension   . Internal hemorrhoids     Patient Active Problem List   Diagnosis Date Noted  . Intractable episodic headache 02/08/2018  . Ataxia 02/08/2018  . Pain of right hip joint 02/17/2017  . Snoring 12/14/2016  . Hypertension 12/14/2016  . Gastroesophageal reflux disease with esophagitis 08/26/2016  . Dysphagia 08/26/2016  . Spondylolisthesis of lumbar region 05/28/2016  . Trochanteric bursitis of left hip 02/09/2016  . Trochanteric bursitis of right hip 01/12/2016  . Fibromyalgia 08/08/2015  . Lumbar facet arthropathy 08/08/2015  . Primary osteoarthritis of both knees 08/25/2014  . Routine general medical examination at a health care facility 06/01/2013  . Encounter for screening mammogram for breast cancer 06/01/2013  . Back pain without radiation 08/29/2012  . Constipation  11/15/2011  . ALLERGIC RHINITIS 01/26/2010    Past Surgical History:  Procedure Laterality Date  . CARPAL TUNNEL RELEASE Right   . TUBAL LIGATION      OB History   None      Home Medications    Prior to Admission medications   Medication Sig Start Date End Date Taking? Authorizing Provider  albuterol (PROVENTIL HFA;VENTOLIN HFA) 108 (90 Base) MCG/ACT inhaler Inhale 2 puffs into the lungs every 4 (four) hours as needed for wheezing or shortness of breath (cough, shortness of breath or wheezing.). 03/11/18   Robyn Haber, MD  aluminum chloride (DRYSOL) 20 % external solution Apply topically at bedtime. 03/08/17   Landis Martins, DPM  furosemide (LASIX) 20 MG tablet Take 1 tablet (20 mg total) by mouth daily. 04/03/18   Raylene Everts, MD  verapamil (CALAN-SR) 120 MG CR tablet Take 1 tablet (120 mg total) by mouth at bedtime. 03/07/18   [name], MD    Family History Family History  Problem Relation Age of Onset  . Arthritis Other   . Diabetes Other   . Hypertension Other   . Breast cancer Other   . Hypertension Mother   . Diabetes Sister   . Hypertension Sister   . Cancer Neg Hx   . Early death Neg Hx   . Hearing loss Neg Hx   . Heart  disease Neg Hx   . Hyperlipidemia Neg Hx   . Kidney disease Neg Hx   . Stroke Neg Hx   . Colon cancer Neg Hx     Social History Social History   Tobacco Use  . Smoking status: Never Smoker  . Smokeless tobacco: Never Used  Substance Use Topics  . Alcohol use: No    Alcohol/week: 0.0 oz  . Drug use: No     Allergies   Patient has no known allergies.   Review of Systems Review of Systems  Constitutional: Negative for chills and fever.  HENT: Negative for ear pain and sore throat.   Eyes: Negative for pain and visual disturbance.  Respiratory: Negative for cough and shortness of breath.   Cardiovascular: Positive for leg swelling. Negative for chest pain and palpitations.  Gastrointestinal: Negative for  abdominal distention, abdominal pain and vomiting.  Genitourinary: Negative for dysuria and hematuria.  Musculoskeletal: Negative for arthralgias and back pain.  Skin: Negative for color change and rash.  Neurological: Negative for seizures, syncope and headaches.  All other systems reviewed and are negative.    Physical Exam Triage Vital Signs ED Triage Vitals  Enc Vitals Group     BP 04/03/18 1155 (!) 143/88     Pulse Rate 04/03/18 1155 66     Resp 04/03/18 1155 18     Temp 04/03/18 1155 98 F (36.7 C)     Temp src --      SpO2 04/03/18 1155 99 %     Weight --      Height --      Head Circumference --      Peak Flow --      Pain Score 04/03/18 1154 0     Pain Loc --      Pain Edu? --      Excl. in [place]? --    No data found.  Updated Vital Signs BP (!) 143/88   Pulse 66   Temp 98 F (36.7 C)   Resp 18   LMP 05/17/2012   SpO2 99%      Physical Exam  Constitutional: She appears well-developed and well-nourished. No distress.  HENT:  Head: Normocephalic and atraumatic.  Mouth/Throat: Oropharynx is clear and moist.  Eyes: Pupils are equal, round, and reactive to light. Conjunctivae are normal.  Neck: Normal range of motion. Neck supple. No JVD present.  Cardiovascular: Normal rate, regular rhythm and normal heart sounds.  No murmur heard. Rare ectopy  Pulmonary/Chest: Effort normal and breath sounds normal. No respiratory distress. She has no rales.  Abdominal: Soft. Bowel sounds are normal. She exhibits no distension.  No organomegaly  Musculoskeletal: Normal range of motion. She exhibits edema.  Tense pedal edema bilaterally  Neurological: She is alert.  Skin: Skin is warm and dry.  Eczema rash on both feet and lower ankles, some fluid weeping.  No cellulitis  Psychiatric: She has a normal mood and affect.  Poor fund of knowledge     UC Treatments / Results  Labs (all labs ordered are listed, but only abnormal results are displayed) Labs Reviewed  POCT  I-STAT, CHEM 8 - Abnormal; Notable for the following components:      Result Value   Creatinine, Ser 1.30 (*)    Calcium, Ion 1.12 (*)    All other components within normal limits    EKG Borderline criteria for LVH.  Faint NS ST changes.  Nothing acute  Radiology Dg Chest 2 View  Result Date: 04/03/2018 CLINICAL DATA:  Shortness of breath EXAM: CHEST - 2 VIEW COMPARISON:  September 27, 2012 FINDINGS: There is no edema or consolidation. The heart size and pulmonary vascularity are normal. No adenopathy. No pneumothorax. No evident bone lesions. IMPRESSION: No edema or consolidation. Electronically Signed   By: Lowella Grip III M.D.   On: 04/03/2018 13:07    Procedures Procedures (including critical care time)  Medications Ordered in UC Medications - No data to display  Initial Impression / Assessment and Plan / UC Course  I have reviewed the triage vital signs and the nursing notes.  Pertinent labs & imaging results that were available during my care of the patient were reviewed by me and considered in my medical decision making (see chart for details).     Discussed that pedal edema can be caused by a lot of different processes.  Liver and kidney disease, heart failure, medication effect, salt indiscretion.  I did a cursory work-up with an EKG, chest x-ray, and chemistries which showed mild renal impairment.  She needs to follow-up with her primary care physician within the next 2 weeks. Final Clinical Impressions(s) / UC Diagnoses   Final diagnoses:  Peripheral edema  Shortness of breath  Hypertension, unspecified type     Discharge Instructions     Avoid salt in foods Elevate legs above level of heart Take the furosemide once a day for 3-5 days After this take one a day as needed for swelling See your PCP in a couple of weeks Eat potassium foods like orange juice and bananas   ED Prescriptions    Medication Sig Dispense Auth. Provider   furosemide (LASIX) 20  MG tablet Take 1 tablet (20 mg total) by mouth daily. 30 tablet Raylene Everts, MD     Controlled Substance Prescriptions [place] Controlled Substance Registry consulted? Not Applicable   Raylene Everts, MD 04/03/18 [phone]

## 2018-04-05 ENCOUNTER — Encounter: Payer: Self-pay | Admitting: Internal Medicine

## 2018-05-04 ENCOUNTER — Encounter: Payer: Self-pay | Admitting: Internal Medicine

## 2018-06-05 ENCOUNTER — Encounter: Payer: Self-pay | Admitting: Internal Medicine

## 2018-06-14 ENCOUNTER — Telehealth: Payer: Self-pay | Admitting: Internal Medicine

## 2018-06-14 MED ORDER — VERAPAMIL HCL ER 120 MG PO TBCR: 120.0000 mg | EXTENDED_RELEASE_TABLET | Freq: Every day | ORAL | 0 refills | Status: DC

## 2018-06-14 NOTE — Telephone Encounter (Signed)
Copied from [place] [id number]. Topic: Quick Communication - Rx Refill/Question >> Jun 14, 2018 10:08 AM Carolyn Stare wrote: Medication verapamil (CALAN-SR) 120 MG CR tablet  Preferred [facility]   Agent: Please be advised that RX refills may take up to 3 business days. We ask that you follow-up with your pharmacy.

## 2018-06-20 ENCOUNTER — Ambulatory Visit: Payer: Self-pay | Admitting: Internal Medicine

## 2018-06-20 NOTE — Telephone Encounter (Signed)
Pt c/o bilateral lower leg and feet edema. Pt stated moderate swelling starts mid calves to toes. Pt stated the swelling looks red and infected. Pt stated the edema is painful to touch and pt scored the pain level a 8 out of 10. Pt denies fever. Pt stated she was supposed to have a "xray of my kidneys" but "I didn't have enough money to have the xray." Pt stated she has never has leg swelling before. Pt denies chest pain or difficulty breathing.  Pt stated that she was approved for Medicare and has the approval letter. Per Tammy at the office, pt needs to brings the approval letter to her appointment. Pt informed and pt agreeable. Care advice given and pt verbalized understanding.  Pt requesting an appointment between 10 and 11 o'clock. Pt requested Dr Ronnald Ramp but there was no availability. Pt stated that it would be better, due to  transportation to come on Thursday during the specific time stated. Appt made for  06/22/18 at 1040 am. Reason for Disposition . [1] MODERATE leg swelling (e.g., swelling extends up to knees) AND [2] new onset or worsening  Answer Assessment - Initial Assessment Questions 1. ONSET: "When did the swelling start?" (e.g., minutes, hours, days)     2 months 2. LOCATION: "What part of the leg is swollen?"  "Are both legs swollen or just one leg?"     Mid calf all the way down to the toes - both legs are swollen 3. SEVERITY: "How bad is the swelling?" (e.g., localized; mild, moderate, severe)  - Localized - small area of swelling localized to one leg  - MILD pedal edema - swelling limited to foot and ankle, pitting edema < 1/4 inch (6 mm) deep, rest and elevation eliminate most or all swelling  - MODERATE edema - swelling of lower leg to knee, pitting edema > 1/4 inch (6 mm) deep, rest and elevation only partially reduce swelling  - SEVERE edema - swelling extends above knee, facial or hand swelling present      Moderate swelling 4. REDNESS: "Does the swelling look red or  infected?"    yes 5. PAIN: "Is the swelling painful to touch?" If so, ask: "How painful is it?"   (Scale 1-10; mild, moderate or severe)     Painful to touch 8/10 6. FEVER: "Do you have a fever?" If so, ask: "What is it, how was it measured, and when did it start?"      No  7. CAUSE: "What do you think is causing the leg swelling?"     Pt doesn't know 8. MEDICAL HISTORY: "Do you have a history of heart failure, kidney disease, liver failure, or cancer?"     No pt stated was supposed to have an xray but did not have it done 9. RECURRENT SYMPTOM: "Have you had leg swelling before?" If so, ask: "When was the last time?" "What happened that time?"     no 10. OTHER SYMPTOMS: "Do you have any other symptoms?" (e.g., chest pain, difficulty breathing)       no 11. PREGNANCY: "Is there any chance you are pregnant?" "When was your last menstrual period?"       n/a  Protocols used: LEG SWELLING AND EDEMA-A-AH

## 2018-06-22 ENCOUNTER — Ambulatory Visit: Payer: Self-pay | Admitting: Internal Medicine

## 2018-06-26 ENCOUNTER — Ambulatory Visit: Payer: Self-pay | Admitting: Internal Medicine

## 2018-06-26 DIAGNOSIS — Z0289 Encounter for other administrative examinations: Secondary | ICD-10-CM

## 2018-07-05 ENCOUNTER — Other Ambulatory Visit (INDEPENDENT_AMBULATORY_CARE_PROVIDER_SITE_OTHER): Payer: Medicare Other

## 2018-07-05 ENCOUNTER — Other Ambulatory Visit (HOSPITAL_COMMUNITY)
Admission: RE | Admit: 2018-07-05 | Discharge: 2018-07-05 | Disposition: A | Discharge status: Home / Self Care | Payer: Medicare Other | Source: Ambulatory Visit | Attending: Internal Medicine | Admitting: Internal Medicine

## 2018-07-05 ENCOUNTER — Encounter: Payer: Self-pay | Admitting: Internal Medicine

## 2018-07-05 ENCOUNTER — Other Ambulatory Visit: Payer: Medicare Other

## 2018-07-05 ENCOUNTER — Ambulatory Visit (INDEPENDENT_AMBULATORY_CARE_PROVIDER_SITE_OTHER): Payer: Medicare Other | Admitting: Internal Medicine

## 2018-07-05 VITALS — BP 138/90 | HR 67 | Temp 98.7°F | Ht 61.0 in | Wt 249.2 lb

## 2018-07-05 DIAGNOSIS — J4520 Mild intermittent asthma, uncomplicated: Secondary | ICD-10-CM | POA: Insufficient documentation

## 2018-07-05 DIAGNOSIS — R600 Localized edema: Secondary | ICD-10-CM | POA: Diagnosis not present

## 2018-07-05 DIAGNOSIS — E785 Hyperlipidemia, unspecified: Secondary | ICD-10-CM

## 2018-07-05 DIAGNOSIS — Z0000 Encounter for general adult medical examination without abnormal findings: Secondary | ICD-10-CM

## 2018-07-05 DIAGNOSIS — Z1231 Encounter for screening mammogram for malignant neoplasm of breast: Secondary | ICD-10-CM | POA: Diagnosis not present

## 2018-07-05 DIAGNOSIS — I10 Essential (primary) hypertension: Secondary | ICD-10-CM

## 2018-07-05 DIAGNOSIS — Z124 Encounter for screening for malignant neoplasm of cervix: Secondary | ICD-10-CM

## 2018-07-05 DIAGNOSIS — Z23 Encounter for immunization: Secondary | ICD-10-CM

## 2018-07-05 LAB — CBC WITH DIFFERENTIAL/PLATELET
BASOS ABS: 0 10*3/uL (ref 0.0–0.1)
Basophils Relative: 0.4 % (ref 0.0–3.0)
EOS ABS: 0.1 10*3/uL (ref 0.0–0.7)
Eosinophils Relative: 2.7 % (ref 0.0–5.0)
HCT: 41.8 % (ref 36.0–46.0)
Hemoglobin: 13.3 g/dL (ref 12.0–15.0)
LYMPHS ABS: 1.9 10*3/uL (ref 0.7–4.0)
Lymphocytes Relative: 52.6 % — ABNORMAL HIGH (ref 12.0–46.0)
MCHC: 31.9 g/dL (ref 30.0–36.0)
MCV: 82.4 fl (ref 78.0–100.0)
MONO ABS: 0.3 10*3/uL (ref 0.1–1.0)
Monocytes Relative: 8.7 % (ref 3.0–12.0)
NEUTROS PCT: 35.6 % — AB (ref 43.0–77.0)
Neutro Abs: 1.3 10*3/uL — ABNORMAL LOW (ref 1.4–7.7)
Platelets: 227 10*3/uL (ref 150.0–400.0)
RBC: 5.07 Mil/uL (ref 3.87–5.11)
RDW: 16.8 % — ABNORMAL HIGH (ref 11.5–15.5)
WBC: 3.7 10*3/uL — ABNORMAL LOW (ref 4.0–10.5)

## 2018-07-05 LAB — LIPID PANEL
CHOLESTEROL: 204 mg/dL — AB (ref 0–200)
HDL: 48 mg/dL (ref >39.00)
LDL CALC: 141 mg/dL — AB (ref 0–99)
NonHDL: 156.03
TRIGLYCERIDES: 76 mg/dL (ref 0.0–149.0)
Total CHOL/HDL Ratio: 4
VLDL: 15.2 mg/dL (ref 0.0–40.0)

## 2018-07-05 LAB — BASIC METABOLIC PANEL
BUN: 16 mg/dL (ref 6–23)
CALCIUM: 9.4 mg/dL (ref 8.4–10.5)
CO2: 24 mEq/L (ref 19–32)
Chloride: 105 mEq/L (ref 96–112)
Creatinine, Ser: 1.17 mg/dL (ref 0.40–1.20)
GFR: 61.31 mL/min (ref >60.00)
GLUCOSE: 87 mg/dL (ref 70–99)
Potassium: 4.2 mEq/L (ref 3.5–5.1)
Sodium: 139 mEq/L (ref 135–145)

## 2018-07-05 LAB — URINALYSIS, ROUTINE W REFLEX MICROSCOPIC
Bilirubin Urine: NEGATIVE
KETONES UR: NEGATIVE
Leukocytes, UA: NEGATIVE
Nitrite: NEGATIVE
PH: 5 (ref 5.0–8.0)
TOTAL PROTEIN, URINE-UPE24: NEGATIVE
UROBILINOGEN UA: 0.2 (ref 0.0–1.0)
Urine Glucose: NEGATIVE

## 2018-07-05 LAB — BRAIN NATRIURETIC PEPTIDE: PRO B NATRI PEPTIDE: 20 pg/mL (ref 0.0–100.0)

## 2018-07-05 LAB — TSH: TSH: 1.2 u[IU]/mL (ref 0.35–4.50)

## 2018-07-05 MED ORDER — MOMETASONE FURO-FORMOTEROL FUM 100-5 MCG/ACT IN AERO: 2.0000 | INHALATION_SPRAY | Freq: Two times a day (BID) | RESPIRATORY_TRACT | 11 refills | Status: DC

## 2018-07-05 MED ORDER — POTASSIUM CHLORIDE CRYS ER 20 MEQ PO TBCR
20.0000 meq | EXTENDED_RELEASE_TABLET | Freq: Every day | ORAL | 0 refills | Status: DC
Start: 2018-07-05 — End: 2018-09-05

## 2018-07-05 MED ORDER — CHLORTHALIDONE 25 MG PO TABS: 25.0000 mg | ORAL_TABLET | Freq: Every day | ORAL | 0 refills | Status: DC

## 2018-07-05 NOTE — Progress Notes (Signed)
Subjective:  Patient ID: [name], female    DOB: 22-Dec-1960  Age: 57 y.o. MRN: [id number]  CC: Annual Exam and Hypertension   HPI [name] presents for a CPX.  She complains of a 47-month history of nonproductive cough and wheezing.  She was seen by another provider and diagnosed with asthma.  She is trying to get symptom relief with the occasional use of ipratropium/albuterol but has not noticed much improvement.  She was also seen for hypertension by someone else a few months ago.  They treated her with verapamil and furosemide.  She complains that she has since developed lower extremity edema and that her blood pressure is not adequately well controlled.  She denies any recent episodes of chest pain, palpitations, DOE, or diaphoresis.  Past Medical History:  Diagnosis Date  . Allergy    rhinitis  . Anemia   . Arthritis   . Asthma   . Headache(784.0)   . History of stomach ulcers   . Hypertension   . Internal hemorrhoids    Past Surgical History:  Procedure Laterality Date  . CARPAL TUNNEL RELEASE Right   . TUBAL LIGATION      reports that she has never smoked. She has never used smokeless tobacco. She reports that she does not drink alcohol or use drugs. family history includes Arthritis in her other; Breast cancer in her other; Diabetes in her other and sister; Hypertension in her mother, other, and sister. Allergies  Allergen Reactions  . Verapamil Swelling    Outpatient Medications Prior to Visit  Medication Sig Dispense Refill  . albuterol (PROVENTIL HFA;VENTOLIN HFA) 108 (90 Base) MCG/ACT inhaler Inhale 2 puffs into the lungs every 4 (four) hours as needed for wheezing or shortness of breath (cough, shortness of breath or wheezing.). 1 Inhaler 1  . aluminum chloride (DRYSOL) 20 % external solution Apply topically at bedtime. 60 mL 3  . verapamil (CALAN-SR) 120 MG CR tablet Take 1 tablet (120 mg total) by mouth at bedtime. 90 tablet 0  . furosemide  (LASIX) 20 MG tablet Take 1 tablet (20 mg total) by mouth daily. (Patient not taking: Reported on 07/05/2018) 30 tablet 0   No facility-administered medications prior to visit.     ROS Review of Systems  Constitutional: Negative for diaphoresis, fatigue and unexpected weight change.  HENT: Negative.   Eyes: Negative.  Negative for visual disturbance.  Respiratory: Positive for cough and wheezing. Negative for chest tightness and shortness of breath.   Cardiovascular: Positive for leg swelling. Negative for chest pain and palpitations.  Gastrointestinal: Negative for abdominal pain, constipation, diarrhea, nausea and vomiting.  Endocrine: Negative.   Genitourinary: Negative.  Negative for difficulty urinating, pelvic pain, urgency, vaginal bleeding and vaginal discharge.  Musculoskeletal: Negative.  Negative for arthralgias, back pain, joint swelling and myalgias.  Skin: Negative.  Negative for color change and pallor.  Neurological: Negative.  Negative for dizziness, light-headedness and headaches.  Hematological: Negative for adenopathy. Does not bruise/bleed easily.  Psychiatric/Behavioral: Negative.     Objective:  BP 138/90 (BP Location: Left Arm, Patient Position: Sitting, Cuff Size: Large)   Pulse 67   Temp 98.7 F (37.1 C) (Oral)   Ht 5\' 1"  (1.549 m)   Wt 249 lb 4 oz (113.1 kg)   LMP 05/17/2012   SpO2 97%   BMI 47.10 kg/m   BP Readings from Last 3 Encounters:  07/05/18 138/90  04/03/18 (!) 143/88  03/11/18 (!) 167/100    [facility]  Readings from Last 3 Encounters:  07/05/18 249 lb 4 oz (113.1 kg)  02/08/18 253 lb 2 oz (114.8 kg)  02/17/17 236 lb (107 kg)    Physical Exam  Constitutional: She is oriented to person, place, and time. No distress.  HENT:  Mouth/Throat: Oropharynx is clear and moist. No oropharyngeal exudate.  Eyes: Conjunctivae are normal. No scleral icterus.  Neck: Normal range of motion. Neck supple. No JVD present. No thyromegaly present.    Cardiovascular: Normal rate, regular rhythm and normal heart sounds. Exam reveals no gallop.  No murmur heard. EKG --  Sinus  Rhythm  -  Diffuse nonspecific T-abnormality.   ABNORMAL - no change from EKG done 3 months ago  Pulmonary/Chest: Effort normal and breath sounds normal. No respiratory distress. She has no wheezes. She has no rhonchi. She has no rales. Right breast exhibits no inverted nipple, no mass, no nipple discharge, no skin change and no tenderness. Left breast exhibits no inverted nipple, no mass, no nipple discharge, no skin change and no tenderness.  Abdominal: Soft. Normal appearance and bowel sounds are normal. She exhibits no mass. There is no hepatosplenomegaly. There is no tenderness. Hernia confirmed negative in the right inguinal area and confirmed negative in the left inguinal area.  Genitourinary: Rectum normal, vagina normal and uterus normal. Rectal exam shows no external hemorrhoid, no internal hemorrhoid, no fissure, no mass, no tenderness, anal tone normal and guaiac negative stool. Pelvic exam was performed with patient supine. There is no rash, tenderness, lesion or injury on the right labia. There is no rash, tenderness, lesion or injury on the left labia. Uterus is not deviated, not enlarged, not fixed and not tender. Cervix exhibits no motion tenderness, no discharge and no friability. Right adnexum displays no mass, no tenderness and no fullness. Left adnexum displays no mass, no tenderness and no fullness. No erythema, tenderness or bleeding in the vagina. No foreign body in the vagina. No signs of injury around the vagina. No vaginal discharge found.  Musculoskeletal: She exhibits edema (1+ pitting LE edema). She exhibits no tenderness or deformity.  Lymphadenopathy:    She has no cervical adenopathy. No inguinal adenopathy noted on the right or left side.  Neurological: She is alert and oriented to person, place, and time.  Skin: Skin is warm and dry. She is  not diaphoretic. No pallor.  Vitals reviewed.   Lab Results  Component Value Date   WBC 3.7 (L) 07/05/2018   HGB 13.3 07/05/2018   HCT 41.8 07/05/2018   PLT 227.0 07/05/2018   GLUCOSE 87 07/05/2018   CHOL 204 (H) 07/05/2018   TRIG 76.0 07/05/2018   HDL 48.00 07/05/2018   LDLCALC 141 (H) 07/05/2018   ALT 15 12/14/2016   AST 18 12/14/2016   NA 139 07/05/2018   K 4.2 07/05/2018   CL 105 07/05/2018   CREATININE 1.17 07/05/2018   BUN 16 07/05/2018   CO2 24 07/05/2018   TSH 1.20 07/05/2018    Dg Chest 2 View  Result Date: 04/03/2018 CLINICAL DATA:  Shortness of breath EXAM: CHEST - 2 VIEW COMPARISON:  September 27, 2012 FINDINGS: There is no edema or consolidation. The heart size and pulmonary vascularity are normal. No adenopathy. No pneumothorax. No evident bone lesions. IMPRESSION: No edema or consolidation. Electronically Signed   By: Lowella Grip III M.D.   On: 04/03/2018 13:07    Assessment & Plan:   Disha was seen today for annual exam and hypertension.  Diagnoses and  all orders for this visit:  Routine general medical examination at a health care facility  Essential hypertension- Her blood pressure is not well controlled.  Her EKG is negative for LVH or ischemia.  I think the CCB is causing the lower extremity edema.  Will treat her hypertension with a thiazide diuretic.  Will also start a potassium supplement to prevent hypokalemia. -     CBC with Differential/Platelet; Future -     Basic metabolic panel; Future -     Urinalysis, Routine w reflex microscopic; Future -     EKG 12-Lead -     chlorthalidone (HYGROTON) 25 MG tablet; Take 1 tablet (25 mg total) by mouth daily. -     potassium chloride SA (K-DUR,KLOR-CON) 20 MEQ tablet; Take 1 tablet (20 mEq total) by mouth daily.  Encounter for screening mammogram for breast cancer -     MM DIGITAL SCREENING BILATERAL; Future  Hyperlipidemia with target LDL less than 130-she does not have an elevated ASCVD risk  score so I do not recommend a statin for CV risk reduction. -     Lipid panel; Future -     TSH; Future  Need for influenza vaccination -     Flu Vaccine QUAD 36+ mos IM  Localized edema-BNP is negative for signs of fluid overload.  Stop the CCB and start a thiazide diuretic. -     Brain natriuretic peptide; Future  Mild intermittent asthma without complication- I think she would benefit from using a LABA/ICS inhaler.  I gave her a sample of Dulera and showed her how to use it.  She demonstrated proficiency with its use. -     mometasone-formoterol (DULERA) 100-5 MCG/ACT AERO; Inhale 2 puffs into the lungs 2 (two) times daily.  Cervical cancer screening -     Cancel: Cytology - PAP -     Cytology - PAP; Future   I have discontinued Daylin P. Maltese's furosemide and verapamil. I am also having her start on mometasone-formoterol, chlorthalidone, and potassium chloride SA. Additionally, I am having her maintain her aluminum chloride and albuterol.  Meds ordered this encounter  Medications  . mometasone-formoterol (DULERA) 100-5 MCG/ACT AERO    Sig: Inhale 2 puffs into the lungs 2 (two) times daily.    Dispense:  13 g    Refill:  11  . chlorthalidone (HYGROTON) 25 MG tablet    Sig: Take 1 tablet (25 mg total) by mouth daily.    Dispense:  90 tablet    Refill:  0  . potassium chloride SA (K-DUR,KLOR-CON) 20 MEQ tablet    Sig: Take 1 tablet (20 mEq total) by mouth daily.    Dispense:  90 tablet    Refill:  0   See AVS for instructions about healthy living and anticipatory guidance.  Follow-up: Return in about 3 months (around 10/05/2018).  [name], MD

## 2018-07-05 NOTE — Patient Instructions (Signed)

## 2018-07-07 LAB — CYTOLOGY - PAP
ADEQUACY: ABSENT
Chlamydia: NEGATIVE
Diagnosis: NEGATIVE
HPV: NOT DETECTED
NEISSERIA GONORRHEA: NEGATIVE

## 2018-07-07 LAB — HM PAP SMEAR

## 2018-07-07 NOTE — Assessment & Plan Note (Signed)

## 2018-07-11 ENCOUNTER — Ambulatory Visit: Payer: Self-pay | Admitting: Internal Medicine

## 2018-07-11 NOTE — Telephone Encounter (Signed)
Pt call with C/O dizziness after being started on new medication 07/06/18 Pt states medication is for BP but spells out Potassium Chloride. She states further that the room spins when she stands. She was unable to take her BP reading. Pt says she had mild chest pain when standing today x2. She states the pain was rt chest and did not radiate. I was brief and ended in one minute or when sitting down.  She denied nausea, SOB, sweating. She also complained of a headache but considered the real problem to be her dizziness. Care Advice given  Pt verbalized understanding. Pt was given an appointment tomorrow morning because she has no transportation today. Reason for Disposition . [1] MODERATE dizziness (e.g., interferes with normal activities) AND [2] has NOT been evaluated by physician for this  (Exception: dizziness caused by heat exposure, sudden standing, or poor fluid intake)  Answer Assessment - Initial Assessment Questions 1. DESCRIPTION: "Describe your dizziness."     Room spinning must sit until it stops 2. LIGHTHEADED: "Do you feel lightheaded?" (e.g., somewhat faint, woozy, weak upon standing)     Room spinning 3. VERTIGO: "Do you feel like either you or the room is spinning or tilting?" (i.e. vertigo)     yes 4. SEVERITY: "How bad is it?"  "Do you feel like you are going to faint?" "Can you stand and walk?"   - MILD - walking normally   - MODERATE - interferes with normal activities (e.g., work, school)    - SEVERE - unable to stand, requires support to walk, feels like passing out now.      moderate 5. ONSET:  "When did the dizziness begin?"     With new medication  Potasium Chloride 6. AGGRAVATING FACTORS: "Does anything make it worse?" (e.g., standing, change in head position)     standing 7. HEART RATE: "Can you tell me your heart rate?" "How many beats in 15 seconds?"  (Note: not all patients can do this)       no 8. CAUSE: "What do you think is causing the dizziness?"     Potassium  chloride 9. RECURRENT SYMPTOM: "Have you had dizziness before?" If so, ask: "When was the last time?" "What happened that time?"     Yes when she went without taking medication in the past 10. OTHER SYMPTOMS: "Do you have any other symptoms?" (e.g., fever, chest pain, vomiting, diarrhea, bleeding)       Chest pain x1 minute x two today Does not travel just the center of chest. Now your right side is sore.  Protocols used: DIZZINESS [facility]

## 2018-07-12 ENCOUNTER — Encounter: Payer: Self-pay | Admitting: Family

## 2018-07-12 ENCOUNTER — Ambulatory Visit (INDEPENDENT_AMBULATORY_CARE_PROVIDER_SITE_OTHER): Payer: Medicare Other | Admitting: Family

## 2018-07-12 VITALS — BP 142/84 | HR 71 | Temp 98.1°F | Ht 61.0 in | Wt 251.1 lb

## 2018-07-12 DIAGNOSIS — I10 Essential (primary) hypertension: Secondary | ICD-10-CM

## 2018-07-12 NOTE — Progress Notes (Signed)
[name] is a 57 y.o. female with the following history as recorded in [employer]:  Patient Active Problem List   Diagnosis Date Noted  . Hyperlipidemia with target LDL less than 130 07/05/2018  . Localized edema 07/05/2018  . Mild intermittent asthma without complication 40/98/1191  . Intractable episodic headache 02/08/2018  . Ataxia 02/08/2018  . Snoring 12/14/2016  . Hypertension 12/14/2016  . Gastroesophageal reflux disease with esophagitis 08/26/2016  . Dysphagia 08/26/2016  . Spondylolisthesis of lumbar region 05/28/2016  . Trochanteric bursitis of left hip 02/09/2016  . Trochanteric bursitis of right hip 01/12/2016  . Fibromyalgia 08/08/2015  . Lumbar facet arthropathy 08/08/2015  . Primary osteoarthritis of both knees 08/25/2014  . Routine general medical examination at a health care facility 06/01/2013  . Encounter for screening mammogram for breast cancer 06/01/2013  . Back pain without radiation 08/29/2012  . Constipation 11/15/2011  . ALLERGIC RHINITIS 01/26/2010    Current Outpatient Medications  Medication Sig Dispense Refill  . albuterol (PROVENTIL HFA;VENTOLIN HFA) 108 (90 Base) MCG/ACT inhaler Inhale 2 puffs into the lungs every 4 (four) hours as needed for wheezing or shortness of breath (cough, shortness of breath or wheezing.). 1 Inhaler 1  . aluminum chloride (DRYSOL) 20 % external solution Apply topically at bedtime. 60 mL 3  . chlorthalidone (HYGROTON) 25 MG tablet Take 1 tablet (25 mg total) by mouth daily. 90 tablet 0  . mometasone-formoterol (DULERA) 100-5 MCG/ACT AERO Inhale 2 puffs into the lungs 2 (two) times daily. 13 g 11  . potassium chloride SA (K-DUR,KLOR-CON) 20 MEQ tablet Take 1 tablet (20 mEq total) by mouth daily. 90 tablet 0  . traMADol (ULTRAM) 50 MG tablet Take by mouth every 6 (six) hours as needed.     No current facility-administered medications for this visit.     Allergies: Verapamil  Past Medical History:  Diagnosis Date  .  Allergy    rhinitis  . Anemia   . Arthritis   . Asthma   . Headache(784.0)   . History of stomach ulcers   . Hypertension   . Internal hemorrhoids     Past Surgical History:  Procedure Laterality Date  . CARPAL TUNNEL RELEASE Right   . TUBAL LIGATION      Family History  Problem Relation Age of Onset  . Arthritis Other   . Diabetes Other   . Hypertension Other   . Breast cancer Other   . Hypertension Mother   . Diabetes Sister   . Hypertension Sister   . Cancer Neg Hx   . Early death Neg Hx   . Hearing loss Neg Hx   . Heart disease Neg Hx   . Hyperlipidemia Neg Hx   . Kidney disease Neg Hx   . Stroke Neg Hx   . Colon cancer Neg Hx     Social History   Tobacco Use  . Smoking status: Never Smoker  . Smokeless tobacco: Never Used  Substance Use Topics  . Alcohol use: No    Alcohol/week: 0.0 standard drinks    Subjective:  Patient called the triage nurse yesterday with concerns about "dizziness" on her new blood pressure medication; was seen on 8/28 and changed from Verapamil to Chlorthalidone; she misunderstood and has only been taking her potassium supplement; has not taken any blood pressure medication since last Saturday; denies any chest pain on exertion- her EKG last week did not show acute ischemic changes;   Objective:  Vitals:   07/12/18 4782  BP: (!) 142/84  Pulse: 71  Temp: 98.1 F (36.7 C)  TempSrc: Oral  SpO2: 95%  Weight: 251 lb 1.9 oz (113.9 kg)  Height: 5\' 1"  (1.549 m)    General: Well developed, well nourished, in no acute distress  Skin : Warm and dry.  Head: Normocephalic and atraumatic  Lungs: Respirations unlabored; clear to auscultation bilaterally without wheeze, rales, rhonchi  CVS exam: normal rate and regular rhythm.  Neurologic: Alert and oriented; speech intact; face symmetrical; moves all extremities well; CNII-XII intact without focal deficit  Assessment:  1. Essential hypertension     Plan:  Uncontrolled as patient is not  taking her medication as prescribed; suspect this is the source of her dizziness; medication review done- stressed need to take her blood pressure medication everyday as prescribed; she is to take both Chlorthalidone and potassium daily as prescribed; see her PCP in follow-up in 3 months, sooner prn.  Return in about 3 months (around 10/11/2018) for Dr. Ronnald Ramp blood pressure follow-up.  No orders of the defined types were placed in this encounter.   Requested Prescriptions    No prescriptions requested or ordered in this encounter

## 2018-08-10 ENCOUNTER — Other Ambulatory Visit: Payer: Self-pay | Admitting: Internal Medicine

## 2018-08-10 ENCOUNTER — Telehealth: Payer: Self-pay | Admitting: Internal Medicine

## 2018-08-10 DIAGNOSIS — M5126 Other intervertebral disc displacement, lumbar region: Secondary | ICD-10-CM

## 2018-08-10 NOTE — Telephone Encounter (Signed)
Copied from [place] [phone]. Topic: Quick Communication - Other Results >> Aug 10, 2018  5:25 PM Yvette Rack wrote: Medication: traMADol Veatrice Bourbon) 50 MG tablet  Preferred Pharmacy:  [facility] [address] [place] [address] Phone: [phone] Fax: [phone]

## 2018-08-10 NOTE — Telephone Encounter (Signed)
Requested medication (s) are due for refill today: yes  Requested medication (s) are on the active medication list: yes    Last refill: 07/12/18  Future visit scheduled yes...in 2 months  Notes to clinic:By historical provider  Requested Prescriptions  Pending Prescriptions Disp Refills   traMADol (ULTRAM) 50 MG tablet 30 tablet 0    Sig: Take 1 tablet (50 mg total) by mouth every 6 (six) hours as needed.     Not Delegated - Analgesics:  Opioid Agonists Failed - 08/10/2018  5:52 PM      Failed - This refill cannot be delegated      Failed - Urine Drug Screen completed in last 360 days.      Passed - Valid encounter within last 6 months    Recent Outpatient Visits          4 weeks ago Essential hypertension   [place], Marvis Repress, [place]   1 month ago Routine general medical examination at a health care facility   [place], MD   6 months ago Essential hypertension   [place], Thomas L, MD   1 year ago Hypertension, unspecified type   [place], Thomas L, MD   1 year ago Encounter for screening mammogram for breast cancer   [place], MD      Future Appointments            In 2 months [name], MD [place], [facility]

## 2018-08-11 MED ORDER — TRAMADOL HCL 50 MG PO TABS: 50.0000 mg | ORAL_TABLET | Freq: Four times a day (QID) | ORAL | 0 refills | Status: DC | PRN

## 2018-08-11 NOTE — Telephone Encounter (Signed)
Pt calling to check status  °

## 2018-08-11 NOTE — Telephone Encounter (Signed)
This sig does not match last sig prescribed by PCP? Is this accurate, what amount and sig is patient asking for?

## 2018-08-11 NOTE — Telephone Encounter (Signed)
#  100 no refills sent to pharmacy.

## 2018-08-11 NOTE — Telephone Encounter (Signed)
Pt requesting to speak with Dr. Ronnald Ramp nurse in regards to her refill.

## 2018-08-11 NOTE — Telephone Encounter (Signed)
MD is out of the office. Check [place] registry last filled 07/11/2018 pls advise on refill...Johny Chess

## 2018-08-11 NOTE — Telephone Encounter (Signed)
Called pharmacy to verify per tech pt normally get #100 instructions are to take 1 by mouth every six hours as needed. Not sure how sig was change. Per [place] registry was given #100 on 9/3.Marland KitchenJohny Chess

## 2018-08-21 ENCOUNTER — Telehealth: Payer: Self-pay | Admitting: Internal Medicine

## 2018-08-21 NOTE — Telephone Encounter (Signed)
Appointment for 10/17 @1030am .  Tammy can you make this? Thank you.

## 2018-08-21 NOTE — Telephone Encounter (Signed)
Had an appointment for this on 9/4 with [facility]. Please advise.

## 2018-08-21 NOTE — Telephone Encounter (Signed)
We need to see her again.

## 2018-08-21 NOTE — Telephone Encounter (Signed)
Copied from [place] [id number]. Topic: Quick Communication - See Telephone Encounter >> Aug 21, 2018 11:16 AM Blase Mess A wrote: CRM for notification. See Telephone encounter for: 08/21/18.Pt call to say she think her BP med is causing her to be dizzy and headache only in the afternoon since she is taking the med in the morning. She said she will try and get some readings Patient calling back with reading 144/82 Pt does not feel dizzy under later in the afternoons. Please advise [phone]

## 2018-08-24 ENCOUNTER — Ambulatory Visit (INDEPENDENT_AMBULATORY_CARE_PROVIDER_SITE_OTHER): Payer: Medicare Other | Admitting: Internal Medicine

## 2018-08-24 ENCOUNTER — Encounter: Payer: Self-pay | Admitting: Internal Medicine

## 2018-08-24 ENCOUNTER — Other Ambulatory Visit (INDEPENDENT_AMBULATORY_CARE_PROVIDER_SITE_OTHER): Payer: Medicare Other

## 2018-08-24 VITALS — BP 128/88 | HR 74 | Temp 98.3°F | Resp 16 | Ht 61.0 in | Wt 242.5 lb

## 2018-08-24 DIAGNOSIS — T50905A Adverse effect of unspecified drugs, medicaments and biological substances, initial encounter: Secondary | ICD-10-CM | POA: Diagnosis not present

## 2018-08-24 DIAGNOSIS — E876 Hypokalemia: Secondary | ICD-10-CM | POA: Diagnosis not present

## 2018-08-24 DIAGNOSIS — I10 Essential (primary) hypertension: Secondary | ICD-10-CM | POA: Diagnosis not present

## 2018-08-24 DIAGNOSIS — M7062 Trochanteric bursitis, left hip: Secondary | ICD-10-CM | POA: Diagnosis not present

## 2018-08-24 DIAGNOSIS — M7061 Trochanteric bursitis, right hip: Secondary | ICD-10-CM

## 2018-08-24 LAB — BASIC METABOLIC PANEL
BUN: 17 mg/dL (ref 6–23)
CHLORIDE: 97 meq/L (ref 96–112)
CO2: 32 mEq/L (ref 19–32)
Calcium: 9.8 mg/dL (ref 8.4–10.5)
Creatinine, Ser: 1.24 mg/dL — ABNORMAL HIGH (ref 0.40–1.20)
GFR: 57.31 mL/min — ABNORMAL LOW (ref >60.00)
Glucose, Bld: 101 mg/dL — ABNORMAL HIGH (ref 70–99)
Potassium: 3 mEq/L — ABNORMAL LOW (ref 3.5–5.1)
Sodium: 139 mEq/L (ref 135–145)

## 2018-08-24 MED ORDER — SPIRONOLACTONE 25 MG PO TABS: 25.0000 mg | ORAL_TABLET | Freq: Every day | ORAL | 0 refills | Status: DC

## 2018-08-24 NOTE — Patient Instructions (Signed)

## 2018-08-24 NOTE — Progress Notes (Signed)
Subjective:  Patient ID: [name], female    DOB: [birth year]  Age: 57 y.o. MRN: [id number]  CC: Hypertension   HPI [name] presents for f/up - She complains that chlorthalidone has caused orthostatic dizziness.  The symptoms are getting better.  She tells me the lower extremity edema has resolved and her blood pressure has been well controlled for the most part.  She tells me that about 3 days ago she ate a bunch of chips during the night and the next day her blood pressure was high.  Outpatient Medications Prior to Visit  Medication Sig Dispense Refill  . albuterol (PROVENTIL HFA;VENTOLIN HFA) 108 (90 Base) MCG/ACT inhaler Inhale 2 puffs into the lungs every 4 (four) hours as needed for wheezing or shortness of breath (cough, shortness of breath or wheezing.). 1 Inhaler 1  . aluminum chloride (DRYSOL) 20 % external solution Apply topically at bedtime. 60 mL 3  . mometasone-formoterol (DULERA) 100-5 MCG/ACT AERO Inhale 2 puffs into the lungs 2 (two) times daily. 13 g 11  . potassium chloride SA (K-DUR,KLOR-CON) 20 MEQ tablet Take 1 tablet (20 mEq total) by mouth daily. 90 tablet 0  . traMADol (ULTRAM) 50 MG tablet TAKE 1 TABLET BY MOUTH EVERY 6 HOURS AS NEEDED FOR PAIN 100 tablet 3  . chlorthalidone (HYGROTON) 25 MG tablet Take 1 tablet (25 mg total) by mouth daily. 90 tablet 0  . traMADol (ULTRAM) 50 MG tablet Take 1 tablet (50 mg total) by mouth every 6 (six) hours as needed. This is a 30 day supply 100 tablet 0   No facility-administered medications prior to visit.     ROS Review of Systems  Constitutional: Negative for diaphoresis, fatigue and unexpected weight change.  HENT: Negative.   Eyes: Negative for visual disturbance.  Respiratory: Negative for cough, chest tightness, shortness of breath and wheezing.   Cardiovascular: Negative for chest pain, palpitations and leg swelling.  Gastrointestinal: Negative for abdominal pain, diarrhea, nausea and vomiting.    Genitourinary: Negative.  Negative for difficulty urinating and frequency.  Musculoskeletal: Positive for arthralgias and back pain. Negative for gait problem, myalgias and neck pain.  Skin: Negative.  Negative for color change, pallor and rash.  Neurological: Positive for dizziness. Negative for weakness and light-headedness.  Hematological: Negative for adenopathy. Does not bruise/bleed easily.  Psychiatric/Behavioral: Negative.     Objective:  BP 128/88 (BP Location: Left Arm, Patient Position: Sitting, Cuff Size: Large)   Pulse 74   Temp 98.3 F (36.8 C) (Oral)   Resp 16   Ht 5\' 1"  (1.549 m)   Wt 242 lb 8 oz (110 kg)   LMP 05/17/2012   SpO2 94%   BMI 45.82 kg/m   BP Readings from Last 3 Encounters:  08/24/18 128/88  07/12/18 (!) 142/84  07/05/18 138/90    Wt Readings from Last 3 Encounters:  08/24/18 242 lb 8 oz (110 kg)  07/12/18 251 lb 1.9 oz (113.9 kg)  07/05/18 249 lb 4 oz (113.1 kg)    Physical Exam  Constitutional: She is oriented to person, place, and time. No distress.  HENT:  Mouth/Throat: Oropharynx is clear and moist. No oropharyngeal exudate.  Eyes: Conjunctivae are normal. No scleral icterus.  Neck: Normal range of motion. Neck supple. No JVD present. No thyromegaly present.  Cardiovascular: Normal rate, regular rhythm and normal heart sounds. Exam reveals no gallop.  No murmur heard. Pulmonary/Chest: Effort normal and breath sounds normal. No respiratory distress. She has no wheezes.  She has no rales.  Abdominal: Soft. Bowel sounds are normal. She exhibits no mass. There is no tenderness.  Musculoskeletal: Normal range of motion. She exhibits no edema, tenderness or deformity.  Lymphadenopathy:    She has no cervical adenopathy.  Neurological: She is alert and oriented to person, place, and time.  Skin: Skin is warm and dry. She is not diaphoretic. No pallor.  Psychiatric: She has a normal mood and affect. Her behavior is normal. Judgment and  thought content normal.  Vitals reviewed.   Lab Results  Component Value Date   WBC 3.7 (L) 07/05/2018   HGB 13.3 07/05/2018   HCT 41.8 07/05/2018   PLT 227.0 07/05/2018   GLUCOSE 101 (H) 08/24/2018   CHOL 204 (H) 07/05/2018   TRIG 76.0 07/05/2018   HDL 48.00 07/05/2018   LDLCALC 141 (H) 07/05/2018   ALT 15 12/14/2016   AST 18 12/14/2016   NA 139 08/24/2018   K 3.0 (L) 08/24/2018   CL 97 08/24/2018   CREATININE 1.24 (H) 08/24/2018   BUN 17 08/24/2018   CO2 32 08/24/2018   TSH 1.20 07/05/2018    No results found.  Assessment & Plan:   [name] was seen today for hypertension.  Diagnoses and all orders for this visit:  Essential hypertension- Her blood pressure is well controlled but she has developed symptomatic hypokalemia with a potassium down to 3.0.  I have asked her to stop taking the thiazide diuretic and will change to his potassium sparing diuretic. -     Basic metabolic panel; Future -     spironolactone (ALDACTONE) 25 MG tablet; Take 1 tablet (25 mg total) by mouth daily.  Trochanteric bursitis of right hip- She was told to get hip injections a year ago by orthopedics.  She did not have it done because she did not have any insurance at that time.  I will refer her back to orthopedics to see if she would benefit from intra-articular injection. -     Ambulatory referral to Orthopedic Surgery  Trochanteric bursitis of left hip -     Ambulatory referral to Orthopedic Surgery  Drug-induced hypokalemia -     spironolactone (ALDACTONE) 25 MG tablet; Take 1 tablet (25 mg total) by mouth daily.   I have discontinued [name]'s chlorthalidone. I am also having her start on spironolactone. Additionally, I am having her maintain her aluminum chloride, albuterol, mometasone-formoterol, potassium chloride SA, and traMADol.  Meds ordered this encounter  Medications  . spironolactone (ALDACTONE) 25 MG tablet    Sig: Take 1 tablet (25 mg total) by mouth daily.     Dispense:  90 tablet    Refill:  0     Follow-up: Return in about 6 months (around 02/23/2019).  Crystal Calico, MD

## 2018-09-04 ENCOUNTER — Ambulatory Visit: Payer: Self-pay | Admitting: *Deleted

## 2018-09-04 NOTE — Telephone Encounter (Signed)
Pt called with complaints of headaches after taking spironolactone but that resolved when she changed the time that she was taking it; she also said increased pain when walking barefoot and swelling in her feet on 08/30/18; she says that she started taking the medication on 08/25/18; the pt also says that she is having itching in her legs and feet (pt has excema) and she also has blisters on her legs, and she has swelling from her calf to her feet and toes; she says that she stopped the aldactone on 09/01/18 the pt also says that she continues to have back pain   Reason for Disposition . [1] MODERATE leg swelling (e.g., swelling extends up to knees) AND [2] new onset or worsening  Answer Assessment - Initial Assessment Questions 1. ONSET: "When did the swelling start?" (e.g., minutes, hours, days)     08/25/18 2. LOCATION: "What part of the leg is swollen?"  "Are both legs swollen or just one leg?"     Both legs 3. SEVERITY: "How bad is the swelling?" (e.g., localized; mild, moderate, severe)  - Localized - small area of swelling localized to one leg  - MILD pedal edema - swelling limited to foot and ankle, pitting edema < 1/4 inch (6 mm) deep, rest and elevation eliminate most or all swelling  - MODERATE edema - swelling of lower leg to knee, pitting edema > 1/4 inch (6 mm) deep, rest and elevation only partially reduce swelling  - SEVERE edema - swelling extends above knee, facial or hand swelling present      moderate 4. REDNESS: "Does the swelling look red or infected?"     Feet are reddened; blisters on both legs 5. PAIN: "Is the swelling painful to touch?" If so, ask: "How painful is it?"   (Scale 1-10; mild, moderate or severe)     Rated 8 out of 10 6. FEVER: "Do you have a fever?" If so, ask: "What is it, how was it measured, and when did it start?"      no 7. CAUSE: "What do you think is causing the leg swelling?"     Medication changed 8. MEDICAL HISTORY: "Do you have a history of  heart failure, kidney disease, liver failure, or cancer?"     High blood pressure 9. RECURRENT SYMPTOM: "Have you had leg swelling before?" If so, ask: "When was the last time?" "What happened that time?"     Yes, md treated previously 10. OTHER SYMPTOMS: "Do you have any other symptoms?" (e.g., chest pain, difficulty breathing)       Ongoing back pain 11. PREGNANCY: "Is there any chance you are pregnant?" "When was your last menstrual period?"       No  Protocols used: LEG SWELLING AND EDEMA-A-AH

## 2018-09-05 ENCOUNTER — Other Ambulatory Visit (INDEPENDENT_AMBULATORY_CARE_PROVIDER_SITE_OTHER): Payer: Medicare Other

## 2018-09-05 ENCOUNTER — Telehealth: Payer: Self-pay | Admitting: Internal Medicine

## 2018-09-05 ENCOUNTER — Encounter: Payer: Self-pay | Admitting: Internal Medicine

## 2018-09-05 ENCOUNTER — Ambulatory Visit (INDEPENDENT_AMBULATORY_CARE_PROVIDER_SITE_OTHER): Payer: Medicare Other | Admitting: Internal Medicine

## 2018-09-05 VITALS — BP 132/94 | HR 83 | Temp 98.3°F | Resp 16 | Ht 61.0 in | Wt 238.0 lb

## 2018-09-05 DIAGNOSIS — I10 Essential (primary) hypertension: Secondary | ICD-10-CM

## 2018-09-05 DIAGNOSIS — E876 Hypokalemia: Secondary | ICD-10-CM

## 2018-09-05 DIAGNOSIS — R9431 Abnormal electrocardiogram [ECG] [EKG]: Secondary | ICD-10-CM

## 2018-09-05 DIAGNOSIS — T50905A Adverse effect of unspecified drugs, medicaments and biological substances, initial encounter: Secondary | ICD-10-CM

## 2018-09-05 DIAGNOSIS — R0789 Other chest pain: Secondary | ICD-10-CM

## 2018-09-05 LAB — BASIC METABOLIC PANEL
BUN: 22 mg/dL (ref 6–23)
CHLORIDE: 98 meq/L (ref 96–112)
CO2: 31 mEq/L (ref 19–32)
Calcium: 10 mg/dL (ref 8.4–10.5)
Creatinine, Ser: 1.31 mg/dL — ABNORMAL HIGH (ref 0.40–1.20)
GFR: 53.78 mL/min — ABNORMAL LOW (ref >60.00)
Glucose, Bld: 101 mg/dL — ABNORMAL HIGH (ref 70–99)
POTASSIUM: 3.3 meq/L — AB (ref 3.5–5.1)
SODIUM: 139 meq/L (ref 135–145)

## 2018-09-05 LAB — TROPONIN I: TNIDX: 0.01 ug/l (ref 0.00–0.06)

## 2018-09-05 LAB — MAGNESIUM: Magnesium: 2.1 mg/dL (ref 1.5–2.5)

## 2018-09-05 MED ORDER — TRIAMTERENE-HCTZ 50-25 MG PO CAPS: 1.0000 | ORAL_CAPSULE | Freq: Every day | ORAL | 0 refills | Status: DC

## 2018-09-05 NOTE — Patient Instructions (Signed)

## 2018-09-05 NOTE — Telephone Encounter (Signed)
Pt calling back.  She states that the pharmacy doesn't even have this medication and she was told she needed to start it right away.  Pt wants to know what to do about this.  She can be reached at [phone]  Copied from [place] [phone]. Topic: General - Other >> Sep 05, 2018  2:51 PM Mcneil, Ja-Kwan wrote: Reason for CRM: Pt states she went to pick up the Rx for triamterene-hydrochlorothiazide (DYAZIDE) 50-25 MG capsule and the cost was $123.00. Pt asked if there are any coupons that she can have for this medication. Cb# [phone]

## 2018-09-05 NOTE — Progress Notes (Signed)
Subjective:  Patient ID: [name], female    DOB: [birth year]  Age: 57 y.o. MRN: [id number]  CC: Hypertension   HPI [name] presents for f/up - She complains that she had an episode of chest pain 1 day prior to this visit.  She was doing some chores around her house and she had a throbbing sensation in her chest that occurred with activity.  She sat down and the chest pain went away.  She is petrified that she has heart disease.  She also tells me she is not taking spironolactone because she thought it caused a headache.  She has gone back to taking chlorthalidone.  She complains of mild lower extremity edema.  She denies DOE, diaphoresis, dizziness, or lightheadedness.  Outpatient Medications Prior to Visit  Medication Sig Dispense Refill  . albuterol (PROVENTIL HFA;VENTOLIN HFA) 108 (90 Base) MCG/ACT inhaler Inhale 2 puffs into the lungs every 4 (four) hours as needed for wheezing or shortness of breath (cough, shortness of breath or wheezing.). 1 Inhaler 1  . aluminum chloride (DRYSOL) 20 % external solution Apply topically at bedtime. 60 mL 3  . mometasone-formoterol (DULERA) 100-5 MCG/ACT AERO Inhale 2 puffs into the lungs 2 (two) times daily. 13 g 11  . traMADol (ULTRAM) 50 MG tablet TAKE 1 TABLET BY MOUTH EVERY 6 HOURS AS NEEDED FOR PAIN 100 tablet 3  . potassium chloride SA (K-DUR,KLOR-CON) 20 MEQ tablet Take 1 tablet (20 mEq total) by mouth daily. 90 tablet 0  . spironolactone (ALDACTONE) 25 MG tablet Take 1 tablet (25 mg total) by mouth daily. (Patient not taking: Reported on 09/05/2018) 90 tablet 0   No facility-administered medications prior to visit.     ROS Review of Systems  Constitutional: Negative for diaphoresis and fatigue.  HENT: Negative.   Respiratory: Negative for chest tightness, shortness of breath and wheezing.   Cardiovascular: Positive for chest pain and palpitations. Negative for leg swelling.  Gastrointestinal: Negative for abdominal pain,  constipation, diarrhea, nausea and vomiting.  Genitourinary: Negative.  Negative for difficulty urinating and dysuria.  Musculoskeletal: Negative.  Negative for arthralgias, joint swelling and myalgias.  Skin: Negative.   Neurological: Negative.  Negative for dizziness, weakness, light-headedness and headaches.  Hematological: Negative for adenopathy. Does not bruise/bleed easily.  Psychiatric/Behavioral: Negative.     Objective:  BP (!) 132/94 (BP Location: Left Arm, Patient Position: Sitting, Cuff Size: Large)   Pulse 83   Temp 98.3 F (36.8 C) (Oral)   Resp 16   Ht 5\' 1"  (1.549 m)   Wt 238 lb (108 kg)   LMP 05/17/2012   SpO2 98%   BMI 44.97 kg/m   BP Readings from Last 3 Encounters:  09/05/18 (!) 132/94  08/24/18 128/88  07/12/18 (!) 142/84    Wt Readings from Last 3 Encounters:  09/05/18 238 lb (108 kg)  08/24/18 242 lb 8 oz (110 kg)  07/12/18 251 lb 1.9 oz (113.9 kg)    Physical Exam  Constitutional: She is oriented to person, place, and time. No distress.  HENT:  Mouth/Throat: Oropharynx is clear and moist. No oropharyngeal exudate.  Eyes: Conjunctivae are normal. No scleral icterus.  Neck: Normal range of motion. Neck supple. No JVD present. No thyromegaly present.  Cardiovascular: Normal rate, regular rhythm and normal heart sounds. Exam reveals no gallop and no friction rub.  No murmur heard. EKG ---  Sinus  Rhythm  -  Diffuse nonspecific T-abnormality.   ABNORMAL - no change from the  prior EKG  Pulmonary/Chest: Effort normal and breath sounds normal. No respiratory distress. She has no wheezes. She has no rales.  Abdominal: Soft. Bowel sounds are normal. She exhibits no mass. There is no hepatosplenomegaly. There is no tenderness.  Musculoskeletal: Normal range of motion. She exhibits edema. She exhibits no tenderness or deformity.  Trace LE edema  Lymphadenopathy:    She has no cervical adenopathy.  Neurological: She is alert and oriented to person,  place, and time.  Skin: Skin is warm and dry. She is not diaphoretic. No pallor.  Vitals reviewed.   Lab Results  Component Value Date   WBC 3.7 (L) 07/05/2018   HGB 13.3 07/05/2018   HCT 41.8 07/05/2018   PLT 227.0 07/05/2018   GLUCOSE 101 (H) 09/05/2018   CHOL 204 (H) 07/05/2018   TRIG 76.0 07/05/2018   HDL 48.00 07/05/2018   LDLCALC 141 (H) 07/05/2018   ALT 15 12/14/2016   AST 18 12/14/2016   NA 139 09/05/2018   K 3.3 (L) 09/05/2018   CL 98 09/05/2018   CREATININE 1.31 (H) 09/05/2018   BUN 22 09/05/2018   CO2 31 09/05/2018   TSH 1.20 07/05/2018    No results found.  Assessment & Plan:   [name] was seen today for hypertension.  Diagnoses and all orders for this visit:  Drug-induced hypokalemia- Her potassium level is still low at 3.3.  I have asked her to stop taking chlorthalidone.  Will change to a potassium sparing diuretic combined with a thiazide diuretic. -     Basic metabolic panel; Future -     Magnesium; Future -     triamterene-hydrochlorothiazide (DYAZIDE) 50-25 MG capsule; Take 1 capsule by mouth daily.  Other chest pain- Her EKG is abnormal but unchanged over the last year.  Her chest pain description is not consistent with angina but she does have risk factors for CAD.  Her troponin is negative today.  I have asked her to undergo a myocardial perfusion imaging to screen for ischemia. -     EKG 12-Lead -     MYOCARDIAL PERFUSION IMAGING; Future -     Troponin I; Future  Essential hypertension- Her blood pressure is not adequately well controlled and she continues to struggle with hypokalemia.  I have asked her to change to triamterene/hydrochlorothiazide. -     triamterene-hydrochlorothiazide (DYAZIDE) 50-25 MG capsule; Take 1 capsule by mouth daily.  Abnormal electrocardiogram (ECG) (EKG)- As above. -     MYOCARDIAL PERFUSION IMAGING; Future -     Troponin I; Future  Abnormal electrocardiogram -     MYOCARDIAL PERFUSION IMAGING; Future   I have  discontinued [name]'s potassium chloride SA and spironolactone. I am also having her start on triamterene-hydrochlorothiazide. Additionally, I am having her maintain her aluminum chloride, albuterol, mometasone-formoterol, and traMADol.  Meds ordered this encounter  Medications  . triamterene-hydrochlorothiazide (DYAZIDE) 50-25 MG capsule    Sig: Take 1 capsule by mouth daily.    Dispense:  90 capsule    Refill:  0     Follow-up: Return in about 2 months (around 11/05/2018).  [name], MD

## 2018-09-06 ENCOUNTER — Other Ambulatory Visit: Payer: Self-pay | Admitting: Internal Medicine

## 2018-09-06 ENCOUNTER — Telehealth: Payer: Self-pay | Admitting: Internal Medicine

## 2018-09-06 DIAGNOSIS — I10 Essential (primary) hypertension: Secondary | ICD-10-CM

## 2018-09-06 DIAGNOSIS — T50905A Adverse effect of unspecified drugs, medicaments and biological substances, initial encounter: Secondary | ICD-10-CM

## 2018-09-06 DIAGNOSIS — E876 Hypokalemia: Secondary | ICD-10-CM

## 2018-09-06 MED ORDER — TRIAMTERENE 50 MG PO CAPS: 50.0000 mg | ORAL_CAPSULE | Freq: Two times a day (BID) | ORAL | 1 refills | Status: DC

## 2018-09-06 MED ORDER — HYDROCHLOROTHIAZIDE 25 MG PO TABS: 25.0000 mg | ORAL_TABLET | Freq: Every day | ORAL | 0 refills | Status: DC

## 2018-09-06 NOTE — Telephone Encounter (Signed)
Copied from [place] [phone]. Topic: General - Other >> Sep 05, 2018  2:51 PM Mcneil, Ja-Kwan wrote: Reason for CRM: Pt states she went to pick up the Rx for triamterene-hydrochlorothiazide (DYAZIDE) 50-25 MG capsule and the cost was $123.00. Pt asked if there are any coupons that she can have for this medication. Cb# [phone] >> Sep 06, 2018 10:57 AM Carolyn Stare wrote:   Pt call to say that she has went to 3 different pharmacies and was informed that the below med is no longer available and is asking for something else to be called in. She said she has not had no blood pressure medicine in 5 days and need something   triamterene-hydrochlorothiazide (DYAZIDE) 50-25 MG capsule  [facility]

## 2018-09-06 NOTE — Telephone Encounter (Signed)
Pt contacted and pharmacy also contacted, the combination medication is no longer available for the pharmacy to purchase.  Can an alternative be sent in?

## 2018-09-06 NOTE — Telephone Encounter (Signed)
Pt is calling and [facility] does not carry dyazide 50-25 mg. Pt will call around to pharmacies to see who has medication in stock and let us know

## 2018-09-07 ENCOUNTER — Other Ambulatory Visit: Payer: Self-pay | Admitting: Internal Medicine

## 2018-09-07 NOTE — Telephone Encounter (Signed)
Pt calling to check on this medication.  Pt states she is still not feeling well.  Pt also states a refill for Tramadol was supposed to be called in for her, but the pharmacy states it was never received.

## 2018-09-07 NOTE — Telephone Encounter (Signed)
Pt called in stating her medication has not been sent to the pharmacy. I advised pt the combo medication was cancelled and separates were ordered. She said the pharmacy cannot fill it. I called [facility] and spoke to Summer in the pharmacy. She said th triamterene (DYRENIUM) 50 MG capsule is on manufacturer back order and not able to be filled. She states she faxed and called the office yesterday to notify of this issue.   I explained to pt that I would send a msg to see if a different medication can be ordered and that her HCTZ is ready at the pharmacy. Pt very upset and concerned about her BP stating she could have a heart attack and this is her life.   [place], [place] - [address]

## 2018-09-08 NOTE — Telephone Encounter (Signed)
Previous note regarding same. Closing this note.

## 2018-09-08 NOTE — Telephone Encounter (Signed)
Pt contacted and informed that the rx for BP was sent in seperately. Pt also informed tramadol is due for fill yet and to call pharmacy around 11/6 to have the fill medication. Pt stated understanding.

## 2018-09-11 MED ORDER — HYDROCHLOROTHIAZIDE 25 MG PO TABS: 25.0000 mg | ORAL_TABLET | Freq: Every day | ORAL | 1 refills | Status: DC

## 2018-09-11 MED ORDER — TRIAMTERENE 50 MG PO CAPS: 50.0000 mg | ORAL_CAPSULE | Freq: Two times a day (BID) | ORAL | 1 refills | Status: DC

## 2018-09-11 NOTE — Telephone Encounter (Signed)
Pt states that the pharmacy states they have not received the medication refill for her BP medicine in two different pills instead of one.   Pt can be reached at [phone].

## 2018-09-11 NOTE — Addendum Note (Signed)
Addended by: Earnstine Regal on: 09/11/2018 01:23 PM   Modules accepted: Orders

## 2018-09-11 NOTE — Addendum Note (Signed)
Addended by: Karren Cobble on: 09/11/2018 11:17 AM   Modules accepted: Orders

## 2018-09-11 NOTE — Telephone Encounter (Signed)
Resent rx's to [facility].Marland KitchenJohny Berry

## 2018-09-12 ENCOUNTER — Telehealth: Payer: Self-pay | Admitting: Internal Medicine

## 2018-09-12 ENCOUNTER — Other Ambulatory Visit: Payer: Self-pay | Admitting: Internal Medicine

## 2018-09-12 DIAGNOSIS — E876 Hypokalemia: Secondary | ICD-10-CM

## 2018-09-12 DIAGNOSIS — T50905A Adverse effect of unspecified drugs, medicaments and biological substances, initial encounter: Secondary | ICD-10-CM

## 2018-09-12 DIAGNOSIS — I10 Essential (primary) hypertension: Secondary | ICD-10-CM

## 2018-09-12 MED ORDER — TRIAMTERENE-HCTZ 75-50 MG PO TABS: 1.0000 | ORAL_TABLET | Freq: Every day | ORAL | 0 refills | Status: DC

## 2018-09-12 NOTE — Telephone Encounter (Signed)
New RX sent

## 2018-09-12 NOTE — Telephone Encounter (Signed)
Pt informed alternative has been sent in.

## 2018-09-12 NOTE — Telephone Encounter (Signed)
Copied from [place] [id number]. Topic: Quick Communication - Rx Refill/Question >> Sep 12, 2018 12:16 PM Reyne Dumas L wrote: Medication: triamterene (DYRENIUM) 50 MG capsule  Pt states that this medication is going to cost her over $500, and she can't afford it.  Pt states that she has no RX drug coverage at this time.  Pt wants to know what else she can take.

## 2018-09-12 NOTE — Telephone Encounter (Signed)
Called pharmacy and the patient does not have insurance with prescription coverage,   A cheaper alternative for the patient is   Triamaterene HCTZ 37.5-25mg  capsule, 60 pills is $18  A higher dose Triamterene HCTZ 75-50mg  Tablet. 60 pills is $8 The pharmacists recommended the tablets.   Please advise on new RX

## 2018-09-14 ENCOUNTER — Encounter (INDEPENDENT_AMBULATORY_CARE_PROVIDER_SITE_OTHER): Payer: Self-pay | Admitting: Orthopaedic Surgery

## 2018-09-14 ENCOUNTER — Other Ambulatory Visit (INDEPENDENT_AMBULATORY_CARE_PROVIDER_SITE_OTHER): Payer: Self-pay

## 2018-09-14 ENCOUNTER — Ambulatory Visit (INDEPENDENT_AMBULATORY_CARE_PROVIDER_SITE_OTHER): Payer: Medicare Other | Admitting: Orthopaedic Surgery

## 2018-09-14 DIAGNOSIS — Z6841 Body Mass Index (BMI) 40.0 and over, adult: Secondary | ICD-10-CM | POA: Diagnosis not present

## 2018-09-14 DIAGNOSIS — G8929 Other chronic pain: Secondary | ICD-10-CM | POA: Diagnosis not present

## 2018-09-14 DIAGNOSIS — M25551 Pain in right hip: Secondary | ICD-10-CM

## 2018-09-14 DIAGNOSIS — M7061 Trochanteric bursitis, right hip: Secondary | ICD-10-CM

## 2018-09-14 DIAGNOSIS — M5441 Lumbago with sciatica, right side: Secondary | ICD-10-CM | POA: Diagnosis not present

## 2018-09-14 DIAGNOSIS — M5442 Lumbago with sciatica, left side: Principal | ICD-10-CM

## 2018-09-14 MED ORDER — METHYLPREDNISOLONE 4 MG PO TBPK: ORAL_TABLET | ORAL | 0 refills | Status: DC

## 2018-09-14 NOTE — Progress Notes (Signed)
Office Visit Note   Patient: [name]           Date of Birth: [birth year]           MRN: [id number] Visit Date: 09/14/2018              Requested by: [name], MD [address] [place], [place] [address] PCP: [name], MD   Assessment & Plan: Visit Diagnoses:  1. Chronic right-sided low back pain with right-sided sciatica   2. Morbid obesity ([place])   3. Body mass index 45.0-49.9, adult ([place])   4. Trochanteric bursitis, right hip     Plan: Impression is right lower extremity radiculopathy with the addition of questionable trochanteric bursitis.  We will refer the patient to Dr. Ernestina Patches at this point for an epidural steroid injection versus trochanteric bursa injection.  She will follow-up with [name] as needed. The patient meets the AMA guidelines for Morbid (severe) obesity with a BMI > 40.0 and I have recommended weight loss.   Follow-Up Instructions: Return if symptoms worsen or fail to improve.   Orders:  No orders of the defined types were placed in this encounter.  Meds ordered this encounter  Medications  . methylPREDNISolone (MEDROL DOSEPAK) 4 MG TBPK tablet    Sig: Take as directed    Dispense:  21 tablet    Refill:  0      Procedures: No procedures performed   Clinical Data: No additional findings.   Subjective: Chief Complaint  Patient presents with  . Left Hip - Pain  . Right Hip - Pain    HPI patient is a pleasant 57 year old female who presents to our clinic today with recurrent right lower back pain and right lower extremity radiculopathy.  This is been ongoing for over a year now.  Was initially thought that her pain was coming from her right hip, but MRI did not show any positive findings.  She was seen by Dr. Ernestina Patches where her right hip joint was injected.  This did seem to help some, but not 100%.  She was further evaluated and worked up for her back.  MRI of the lumbar spine from May 2018 showed a mild broad-based disc  bulge at L4-5 with moderate bilateral facet arthropathy.  She also had mild bilateral facet arthropathy L5-S1.  She was referred to Dr. Ernestina Patches for an epidural steroid injection, however she lost her insurance and was unable to attend her appointment.  She comes in today for repeat evaluation and hopeful referral back to Dr. Ernestina Patches.  Still having pain to the right lower back rating down the lateral side of her right leg.  Worse when she sits or stands for too long of a time.  She does get mild relief with elevating the right leg.  She has been taking Aleve with minimal relief of symptoms.  She also notes numbness and tingling to the right foot.  Pain sleeping on both sides.  Review of Systems as detailed in HPI.  All others reviewed and are negative.   Objective: Vital Signs: LMP 05/17/2012   Physical Exam well-developed well-nourished female no acute distress.  Alert and oriented x3.  Ortho Exam examination of her lumbar spine reveals mild increased pain with lumbar flexion and extension.  Minimally positive straight leg raise.  Negative logroll.  Marked tenderness over the trochanter bursa on the right.  She is neurovascularly intact distally.  Specialty Comments:  No specialty comments available.  Imaging: No new imaging   PMFS History: Patient Active Problem List   Diagnosis Date Noted  . Morbid obesity ([place]) 09/14/2018  . Body mass index 45.0-49.9, adult ([place]) 09/14/2018  . Chronic right-sided low back pain with right-sided sciatica 09/14/2018  . Hyperlipidemia with target LDL less than 130 07/05/2018  . Mild intermittent asthma without complication 50/35/4656  . Snoring 12/14/2016  . Hypertension 12/14/2016  . Gastroesophageal reflux disease with esophagitis 08/26/2016  . Dysphagia 08/26/2016  . Spondylolisthesis of lumbar region 05/28/2016  . Trochanteric bursitis of left hip 02/09/2016  . Trochanteric bursitis, right hip 01/12/2016  . Fibromyalgia 08/08/2015  . Lumbar facet  arthropathy 08/08/2015  . Primary osteoarthritis of both knees 08/25/2014  . Drug-induced hypokalemia 08/23/2014  . Routine general medical examination at a health care facility 06/01/2013  . Encounter for screening mammogram for breast cancer 06/01/2013  . Back pain without radiation 08/29/2012  . Constipation 11/15/2011  . Abnormal electrocardiogram 02/05/2010  . ALLERGIC RHINITIS 01/26/2010   Past Medical History:  Diagnosis Date  . Allergy    rhinitis  . Anemia   . Arthritis   . Asthma   . Headache(784.0)   . History of stomach ulcers   . Hypertension   . Internal hemorrhoids     Family History  Problem Relation Age of Onset  . Arthritis Other   . Diabetes Other   . Hypertension Other   . Breast cancer Other   . Hypertension Mother   . Diabetes Sister   . Hypertension Sister   . Cancer Neg Hx   . Early death Neg Hx   . Hearing loss Neg Hx   . Heart disease Neg Hx   . Hyperlipidemia Neg Hx   . Kidney disease Neg Hx   . Stroke Neg Hx   . Colon cancer Neg Hx     Past Surgical History:  Procedure Laterality Date  . CARPAL TUNNEL RELEASE Right   . TUBAL LIGATION     Social History   Occupational History  . Not on file  Tobacco Use  . Smoking status: Never Smoker  . Smokeless tobacco: Never Used  Substance and Sexual Activity  . Alcohol use: No    Alcohol/week: 0.0 standard drinks  . Drug use: No  . Sexual activity: Yes    Birth control/protection: Post-menopausal, Surgical

## 2018-09-21 ENCOUNTER — Encounter: Payer: Self-pay | Admitting: Family Medicine

## 2018-09-21 ENCOUNTER — Telehealth (HOSPITAL_COMMUNITY): Payer: Self-pay

## 2018-09-21 ENCOUNTER — Ambulatory Visit: Payer: Self-pay

## 2018-09-21 ENCOUNTER — Ambulatory Visit (INDEPENDENT_AMBULATORY_CARE_PROVIDER_SITE_OTHER): Payer: Medicare Other | Admitting: Family Medicine

## 2018-09-21 VITALS — BP 132/88 | Temp 98.4°F | Wt 236.0 lb

## 2018-09-21 DIAGNOSIS — K5900 Constipation, unspecified: Secondary | ICD-10-CM

## 2018-09-21 DIAGNOSIS — R9431 Abnormal electrocardiogram [ECG] [EKG]: Secondary | ICD-10-CM

## 2018-09-21 NOTE — Telephone Encounter (Signed)
Returned call to patient who states she has not had a BM for at least 3 days.  She states that her last BM was a hard ball that required straining to evacuate.  She has since tried Dulcolax tablets with no results. She tried something that you drink but she said it made her sick and she vomited it up. She denies and swelling of her abdomin.  She has recently been started on Maxide and every time she urinates it causes an urge to have a BM but nothing happens. She has note changed her diet except she is eating more healthy. Appointment scheduled per protocol. Care advice read to patient. Pt verbalized understanding of all instructions.  Reason for Disposition . [1] Constipation persists > 1 week AND [2] no improvement after using CARE ADVICE  Answer Assessment - Initial Assessment Questions 1. STOOL PATTERN OR FREQUENCY: "How often do you pass bowel movements (BMs)?"  (Normal range: tid to q 3 days)  "When was the last BM passed?"       3days ago 2. STRAINING: "Do you have to strain to have a BM?"      yes 3. RECTAL PAIN: "Does your rectum hurt when the stool comes out?" If so, ask: "Do you have hemorrhoids? How bad is the pain?"  (Scale 1-10; or mild, moderate, severe)     no 4. STOOL COMPOSITION: "Are the stools hard?"      Hard ball 5. BLOOD ON STOOLS: "Has there been any blood on the toilet tissue or on the surface of the BM?" If so, ask: "When was the last time?"      no 6. CHRONIC CONSTIPATION: "Is this a new problem for you?"  If no, ask: "How long have you had this problem?" (days, weeks, months)      no 7. CHANGES IN DIET: "Have there been any recent changes in your diet?"      No eating healthy 8. MEDICATIONS: "Have you been taking any new medications?"     Maxide 9. LAXATIVES: "Have you been using any laxatives or enemas?"  If yes, ask "What, how often, and when was the last time?"     Dulcolax  10. CAUSE: "What do you think is causing the constipation?"        New medication 11.  OTHER SYMPTOMS: "Do you have any other symptoms?" (e.g., abdominal pain, fever, vomiting)       no 12. PREGNANCY: "Is there any chance you are pregnant?" "When was your last menstrual period?"      No  Protocols used: CONSTIPATION-A-AH

## 2018-09-21 NOTE — Telephone Encounter (Signed)
Encounter complete. 

## 2018-09-21 NOTE — Progress Notes (Signed)
Subjective:    Patient ID: [name], female    DOB: Feb 10, 1961, 57 y.o.   MRN: [id number]  HPI  Mr. Engebretsen is a 57 year old female who presents today with constipation that has been present for 5 days. She reports that she had one "hard ball" 3 days ago but no other BM since that time. The stool is described as firm and formed when she has BM normally. She has increased water intake but states that she does urinate frequently when taking BP medication. She is trying to focus on increasing her water intake.  She has been taking dulcolax for 3 days with limited benefit. She denies abdominal pain, diarrhea, dysuria, fecal incontinence, fever,   Loss of appetite: No Nausea: No Vomiting: No Diarrhea: No Rectal bleeding: No Fever:No Weight loss: No Hematochezia: No Melena: No Thin caliber stools: No Recent change in stool caliber: No Family History of colon cancer or IBS: Stress/Depression: Yes, daughter is scheduled for surgery tomorrow due to cancer reoccurrence. When patient was asked about stress she started to cry immediately.   Review of Systems  Constitutional: Negative for chills, diaphoresis and unexpected weight change.  Respiratory: Negative for cough, shortness of breath and wheezing.   Cardiovascular: Negative for chest pain and palpitations.  Gastrointestinal: Positive for constipation. Negative for abdominal pain, blood in stool, diarrhea, nausea, rectal pain and vomiting.  Genitourinary: Negative for dysuria.  Skin: Negative for rash.  Neurological: Negative for dizziness, light-headedness and headaches.  Psychiatric/Behavioral:       Denies depression. Increased stress with daughter having surgery for cancer tomorrow.    Past Medical History:  Diagnosis Date  . Allergy    rhinitis  . Anemia   . Arthritis   . Asthma   . Headache(784.0)   . History of stomach ulcers   . Hypertension   . Internal hemorrhoids      Social History   Socioeconomic  History  . Marital status: Married    Spouse name: Not on file  . Number of children: 4  . Years of education: 46  . Highest education level: Not on file  Occupational History  . Not on file  Social Needs  . Financial resource strain: Not on file  . Food insecurity:    Worry: Not on file    Inability: Not on file  . Transportation needs:    Medical: Not on file    Non-medical: Not on file  Tobacco Use  . Smoking status: Never Smoker  . Smokeless tobacco: Never Used  Substance and Sexual Activity  . Alcohol use: No    Alcohol/week: 0.0 standard drinks  . Drug use: No  . Sexual activity: Yes    Birth control/protection: Post-menopausal, Surgical  Lifestyle  . Physical activity:    Days per week: Not on file    Minutes per session: Not on file  . Stress: Not on file  Relationships  . Social connections:    Talks on phone: Not on file    Gets together: Not on file    Attends religious service: Not on file    Active member of club or organization: Not on file    Attends meetings of clubs or organizations: Not on file    Relationship status: Not on file  . Intimate partner violence:    Fear of current or ex partner: Not on file    Emotionally abused: Not on file    Physically abused: Not on file  Forced sexual activity: Not on file  Other Topics Concern  . Not on file  Social History Narrative   Drinks caffeine once a week     Past Surgical History:  Procedure Laterality Date  . CARPAL TUNNEL RELEASE Right   . TUBAL LIGATION      Family History  Problem Relation Age of Onset  . Arthritis Other   . Diabetes Other   . Hypertension Other   . Breast cancer Other   . Hypertension Mother   . Diabetes Sister   . Hypertension Sister   . Cancer Neg Hx   . Early death Neg Hx   . Hearing loss Neg Hx   . Heart disease Neg Hx   . Hyperlipidemia Neg Hx   . Kidney disease Neg Hx   . Stroke Neg Hx   . Colon cancer Neg Hx     Allergies  Allergen Reactions  .  Verapamil Swelling    Current Outpatient Medications on File Prior to Visit  Medication Sig Dispense Refill  . albuterol (PROVENTIL HFA;VENTOLIN HFA) 108 (90 Base) MCG/ACT inhaler Inhale 2 puffs into the lungs every 4 (four) hours as needed for wheezing or shortness of breath (cough, shortness of breath or wheezing.). 1 Inhaler 1  . aluminum chloride (DRYSOL) 20 % external solution Apply topically at bedtime. 60 mL 3  . methylPREDNISolone (MEDROL DOSEPAK) 4 MG TBPK tablet Take as directed 21 tablet 0  . mometasone-formoterol (DULERA) 100-5 MCG/ACT AERO Inhale 2 puffs into the lungs 2 (two) times daily. 13 g 11  . traMADol (ULTRAM) 50 MG tablet TAKE 1 TABLET BY MOUTH EVERY 6 HOURS AS NEEDED FOR PAIN 100 tablet 3  . triamterene-hydrochlorothiazide (MAXZIDE) 75-50 MG tablet Take 1 tablet by mouth daily. 90 tablet 0   No current facility-administered medications on file prior to visit.     BP 132/88   Temp 98.4 F (36.9 C) (Oral)   Wt 236 lb (107 kg)   LMP 05/17/2012   BMI 44.59 kg/m       Objective:   Physical Exam  Constitutional: She is oriented to person, place, and time. She appears well-developed and well-nourished.  Eyes: Pupils are equal, round, and reactive to light. No scleral icterus.  Neck: Neck supple.  Pulmonary/Chest: Effort normal and breath sounds normal. She has no wheezes. She has no rales.  Abdominal: Soft. Bowel sounds are normal. She exhibits no distension. There is no tenderness.  Lymphadenopathy:    She has no cervical adenopathy.  Neurological: She is alert and oriented to person, place, and time.  Skin: Skin is warm and dry. Capillary refill takes less than 2 seconds.  Psychiatric: She has a normal mood and affect. Her behavior is normal. Judgment and thought content normal.      Assessment & Plan:  1. Constipation, unspecified constipation type Exam and history are most consistent with constipation that is likely triggered with decreased water intake  and diuretic use and also recent increased stress with daughter's surgery for cancer reoccurrence. Patient was visibly upset when talking about her daughter's health. Her abdominal exam was benign today. We discussed the importance of adequate water intake and she will initiate Miralax today. She will continue stool softener also until BM occurs. Advised that Miralax can cause diarrhea and she will stop the dose if this occurs and will use if needed prn in the future. Advised close return precautions if symptom does not improve or worsens. She voiced understanding and agreed with plan.  Delano Metz, FNP-C

## 2018-09-21 NOTE — Patient Instructions (Signed)
Please increase water intake.  Add Miralax which can be purchased at your pharmacy. This can be added to water and the directions on the bottle will indicated 17 grams which is a capful.  You can continue your stool softener that you are currently taking until you have a bowel movement.  Please discontinue miralax if you develop diarrhea, blood in your stool, or worsening symptoms. Please follow up for further evaluation if these symptoms are not improved or are worsening.    Constipation, Adult Constipation is when a person:  Poops (has a bowel movement) fewer times in a week than normal.  Has a hard time pooping.  Has poop that is dry, hard, or bigger than normal.  Follow these instructions at home: Eating and drinking   Eat foods that have a lot of fiber, such as: ? Fresh fruits and vegetables. ? Whole grains. ? Beans.  Eat less of foods that are high in fat, low in fiber, or overly processed, such as: ? [place] fries. ? Hamburgers. ? Cookies. ? Candy. ? Soda.  Drink enough fluid to keep your pee (urine) clear or pale yellow. General instructions  Exercise regularly or as told by your doctor.  Go to the restroom when you feel like you need to poop. Do not hold it in.  Take over-the-counter and prescription medicines only as told by your doctor. These include any fiber supplements.  Do pelvic floor retraining exercises, such as: ? Doing deep breathing while relaxing your lower belly (abdomen). ? Relaxing your pelvic floor while pooping.  Watch your condition for any changes.  Keep all follow-up visits as told by your doctor. This is important. Contact a doctor if:  You have pain that gets worse.  You have a fever.  You have not pooped for 4 days.  You throw up (vomit).  You are not hungry.  You lose weight.  You are bleeding from the anus.  You have thin, pencil-like poop (stool). Get help right away if:  You have a fever, and your symptoms  suddenly get worse.  You leak poop or have blood in your poop.  Your belly feels hard or bigger than normal (is bloated).  You have very bad belly pain.  You feel dizzy or you faint. This information is not intended to replace advice given to you by your health care provider. Make sure you discuss any questions you have with your health care provider. Document Released: 04/12/2008 Document Revised: 05/14/2016 Document Reviewed: 04/14/2016 Elsevier Interactive Patient Education  2018 [employer].

## 2018-09-26 ENCOUNTER — Ambulatory Visit (INDEPENDENT_AMBULATORY_CARE_PROVIDER_SITE_OTHER): Payer: Self-pay

## 2018-09-26 ENCOUNTER — Encounter: Payer: Medicare Other | Admitting: *Deleted

## 2018-09-26 ENCOUNTER — Ambulatory Visit (INDEPENDENT_AMBULATORY_CARE_PROVIDER_SITE_OTHER): Payer: Medicare Other | Admitting: Physical Medicine and Rehabilitation

## 2018-09-26 ENCOUNTER — Encounter (INDEPENDENT_AMBULATORY_CARE_PROVIDER_SITE_OTHER): Payer: Self-pay | Admitting: Physical Medicine and Rehabilitation

## 2018-09-26 ENCOUNTER — Ambulatory Visit (HOSPITAL_COMMUNITY)
Admission: RE | Admit: 2018-09-26 | Discharge: 2018-09-26 | Disposition: A | Discharge status: Home / Self Care | Payer: Medicare Other | Source: Ambulatory Visit | Attending: Cardiovascular Disease | Admitting: Cardiovascular Disease

## 2018-09-26 DIAGNOSIS — Z6841 Body Mass Index (BMI) 40.0 and over, adult: Secondary | ICD-10-CM

## 2018-09-26 DIAGNOSIS — M7061 Trochanteric bursitis, right hip: Secondary | ICD-10-CM

## 2018-09-26 DIAGNOSIS — G8929 Other chronic pain: Secondary | ICD-10-CM

## 2018-09-26 DIAGNOSIS — M797 Fibromyalgia: Secondary | ICD-10-CM | POA: Diagnosis not present

## 2018-09-26 DIAGNOSIS — R9431 Abnormal electrocardiogram [ECG] [EKG]: Secondary | ICD-10-CM

## 2018-09-26 DIAGNOSIS — R0789 Other chest pain: Secondary | ICD-10-CM | POA: Diagnosis not present

## 2018-09-26 DIAGNOSIS — M5441 Lumbago with sciatica, right side: Secondary | ICD-10-CM | POA: Diagnosis not present

## 2018-09-26 DIAGNOSIS — Z006 Encounter for examination for normal comparison and control in clinical research program: Secondary | ICD-10-CM

## 2018-09-26 MED ORDER — REGADENOSON 0.4 MG/5ML IV SOLN
0.4000 mg | Freq: Once | INTRAVENOUS | Status: AC
  Administered 2018-09-26: 0.4 mg via INTRAVENOUS

## 2018-09-26 MED ORDER — TECHNETIUM TC 99M TETROFOSMIN IV KIT
31.0000 | PACK | Freq: Once | INTRAVENOUS | Status: AC | PRN
  Administered 2018-09-26: 31 via INTRAVENOUS
  Filled 2018-09-26: qty 31

## 2018-09-26 NOTE — Progress Notes (Signed)
 .  Numeric Pain Rating Scale and Functional Assessment Average Pain 8 Pain Right Now 5 My pain is intermittent and sharp Pain is worse with: walking and sitting Pain improves with: nothing   In the last MONTH (on 0-10 scale) has pain interfered with the following?  1. General activity like being  able to carry out your everyday physical activities such as walking, climbing stairs, carrying groceries, or moving a chair?  Rating(6)  2. Relation with others like being able to carry out your usual social activities and roles such as  activities at home, at work and in your community. Rating(6)  3. Enjoyment of life such that you have  been bothered by emotional problems such as feeling anxious, depressed or irritable?  Rating(2)

## 2018-09-26 NOTE — Patient Instructions (Signed)

## 2018-09-26 NOTE — Progress Notes (Signed)
[name] - 58 y.o. female MRN [id number]  Date of birth: 02-Nov-1961  Office Visit Note: Visit Date: 09/26/2018 PCP: [name], MD Referred by: [name], MD  Subjective: Chief Complaint  Patient presents with  . Right Hip - Pain   HPI: [name] is a 57 y.o. female who comes in today At the request of Dr. Jerilynn Som for evaluation management and possible interventional spine procedure.  She is having right hip and thigh pain which is worse with lying on her side.  She does have pain over the greater trochanter but no groin pain with rotation no groin pain symptomatically.  She is really had nothing that is helped with the pain at all.  Is worse with walking and sitting and laying on that side.  Her average pain is 8 out of 10.  She has had therapy and medications without much success.  They have evaluated her hips which appear to be a nonsurgical issue.  Her case is complicated by concomitant fibromyalgia.  No numbness tingling or paresthesia.  No focal weakness no specific trauma.  MRI findings are reviewed below do show facet arthropathy particular at L4-5 with some lateral recess narrowing.  Review of Systems  Constitutional: Negative for chills, fever, malaise/fatigue and weight loss.  HENT: Negative for hearing loss and sinus pain.   Eyes: Negative for blurred vision, double vision and photophobia.  Respiratory: Negative for cough and shortness of breath.   Cardiovascular: Negative for chest pain, palpitations and leg swelling.  Gastrointestinal: Negative for abdominal pain, nausea and vomiting.  Genitourinary: Negative for flank pain.  Musculoskeletal: Positive for back pain and joint pain. Negative for myalgias.  Skin: Negative for itching and rash.  Neurological: Negative for tremors, focal weakness and weakness.  Endo/Heme/Allergies: Negative.   Psychiatric/Behavioral: Negative for depression.  All other systems reviewed and are negative.  Otherwise  per HPI.  Assessment & Plan: Visit Diagnoses:  1. Greater trochanteric bursitis, right   2. Chronic right-sided low back pain with right-sided sciatica   3. Morbid obesity ([place])   4. Body mass index 45.0-49.9, adult ([place])   5. Fibromyalgia     Plan: Findings:  We decided today that a lot of her symptoms could be related to greater trochanteric bursitis.  She is a fairly large individual with more tissue around the greater trochanter so we did complete diagnostic and hopefully therapeutic trochanteric injection using fluoroscopic guidance.  Order to schedule her for epidural injection at L4-5 which we can modify depending on her results with the injection today.  Would still consider facet joints as a source of pain.  Clearly underlying fibromyalgia with worsening pain complaints overall.    Meds & Orders: No orders of the defined types were placed in this encounter.   Orders Placed This Encounter  Procedures  . Large Joint Inj: R greater trochanter  . XR C-ARM NO REPORT    Follow-up: Return for right L4-5 interlaminar epidural injection.   Procedures: Large Joint Inj: R greater trochanter on 09/26/2018 4:07 PM Indications: pain and diagnostic evaluation Details: 22 G 3.5 in needle, fluoroscopy-guided lateral approach  Arthrogram: No  Medications: 4 mL lidocaine 2 %; 80 mg triamcinolone acetonide 40 MG/ML; 4 mL bupivacaine 0.25 % Outcome: tolerated well, no immediate complications  There was excellent flow of contrast outlined the greater trochanteric bursa without vascular uptake. Procedure, treatment alternatives, risks and benefits explained, specific risks discussed. Consent was given by the patient. Immediately prior  to procedure a time out was called to verify the correct patient, procedure, equipment, support staff and site/side marked as required. Patient was prepped and draped in the usual sterile fashion.      No notes on file   Clinical History: MRI LUMBAR SPINE  WITHOUT CONTRAST  TECHNIQUE: Multiplanar, multisequence MR imaging of the lumbar spine was performed. No intravenous contrast was administered.  COMPARISON:  None.  FINDINGS: Segmentation:  Standard.  Alignment:  2 mm anterolisthesis of L4 on L5.  Vertebrae:  No fracture, evidence of discitis, or bone lesion.  Conus medullaris: Extends to the L1 level and appears normal.  Paraspinal and other soft tissues: No paraspinal abnormality.  Disc levels:  Disc spaces: Mild disc desiccation at L3-4 and L4-5.  T12-L1: No significant disc bulge. No evidence of neural foraminal stenosis. No central canal stenosis.  L1-L2: No significant disc bulge. No evidence of neural foraminal stenosis. No central canal stenosis.  L2-L3: No significant disc bulge. No evidence of neural foraminal stenosis. No central canal stenosis.  L3-L4: Mild broad-based disc bulge. No evidence of neural foraminal stenosis. No central canal stenosis.  L4-L5: Mild broad-based disc bulge. Moderate bilateral facet arthropathy. Mild bilateral foraminal narrowing. No central canal stenosis.  L5-S1: No significant disc bulge. Mild bilateral facet arthropathy. No evidence of neural foraminal stenosis. No central canal stenosis.  IMPRESSION: 1. L4-5 there is a mild broad-based disc bulge. Moderate bilateral facet arthropathy. Mild bilateral foraminal narrowing. 2. At L5-S1 there is mild bilateral facet arthropathy.   Electronically Signed   By: Kathreen Devoid   On: 03/14/2017 15:02   She reports that she has never smoked. She has never used smokeless tobacco. No results for input(s): HGBA1C, LABURIC in the last 8760 hours.  Objective:  VS:  HT:    WT:   BMI:     BP:   HR: bpm  TEMP: ( )  RESP:  Physical Exam Vitals signs and nursing note reviewed.  Constitutional:      General: She is not in acute distress.    Appearance: Normal appearance. She is well-developed. She is obese. She is  not ill-appearing.  HENT:     Head: Normocephalic and atraumatic.  Eyes:     Conjunctiva/sclera: Conjunctivae normal.     Pupils: Pupils are equal, round, and reactive to light.  Cardiovascular:     Rate and Rhythm: Normal rate.     Pulses: Normal pulses.  Pulmonary:     Effort: Pulmonary effort is normal.  Musculoskeletal:     Right lower leg: No edema.     Left lower leg: No edema.     Comments: Patient has pain going from sit to stand in full extension with facet joint loading concordant pain.  She has no paraspinal trigger points but does have some tenderness across the lower back and PSIS and greater trochanter.  She has exquisite tenderness to palpation over the right greater trochanter.  She has no pain with hip rotation, groin pain.  She has normal strength bilaterally without deficit no clonus.  Skin:    General: Skin is warm and dry.     Findings: No erythema or rash.  Neurological:     General: No focal deficit present.     Mental Status: She is alert and oriented to person, place, and time.     Sensory: No sensory deficit.     Motor: No abnormal muscle tone.     Coordination: Coordination normal.  Gait: Gait normal.  Psychiatric:        Mood and Affect: Mood normal.        Behavior: Behavior normal.     Ortho Exam Imaging: No results found.  Past Medical/Family/Surgical/Social History: Medications & Allergies reviewed per EMR, new medications updated. Patient Active Problem List   Diagnosis Date Noted  . Chronic renal insufficiency, stage 3 (moderate) ([place]) 10/11/2018  . Morbid obesity ([place]) 09/14/2018  . Body mass index 45.0-49.9, adult ([place]) 09/14/2018  . Chronic right-sided low back pain with right-sided sciatica 09/14/2018  . Hyperlipidemia with target LDL less than 130 07/05/2018  . Mild intermittent asthma without complication 33/35/4562  . Snoring 12/14/2016  . Hypertension 12/14/2016  . Gastroesophageal reflux disease with esophagitis 08/26/2016    . Dysphagia 08/26/2016  . Spondylolisthesis of lumbar region 05/28/2016  . Trochanteric bursitis of left hip 02/09/2016  . Trochanteric bursitis, right hip 01/12/2016  . Fibromyalgia 08/08/2015  . Lumbar facet arthropathy 08/08/2015  . Primary osteoarthritis of both knees 08/25/2014  . Drug-induced hypokalemia 08/23/2014  . Routine general medical examination at a health care facility 06/01/2013  . Encounter for screening mammogram for breast cancer 06/01/2013  . Back pain without radiation 08/29/2012  . Constipation 11/15/2011  . Abnormal electrocardiogram 02/05/2010  . ALLERGIC RHINITIS 01/26/2010   Past Medical History:  Diagnosis Date  . Allergy    rhinitis  . Anemia   . Arthritis   . Asthma   . Headache(784.0)   . History of stomach ulcers   . Hypertension   . Internal hemorrhoids    Family History  Problem Relation Age of Onset  . Arthritis Other   . Diabetes Other   . Hypertension Other   . Breast cancer Other   . Hypertension Mother   . Diabetes Sister   . Hypertension Sister   . Cancer Neg Hx   . Early death Neg Hx   . Hearing loss Neg Hx   . Heart disease Neg Hx   . Hyperlipidemia Neg Hx   . Kidney disease Neg Hx   . Stroke Neg Hx   . Colon cancer Neg Hx    Past Surgical History:  Procedure Laterality Date  . CARPAL TUNNEL RELEASE Right   . TUBAL LIGATION     Social History   Occupational History  . Not on file  Tobacco Use  . Smoking status: Never Smoker  . Smokeless tobacco: Never Used  Substance and Sexual Activity  . Alcohol use: No    Alcohol/week: 0.0 standard drinks  . Drug use: No  . Sexual activity: Yes    Birth control/protection: Post-menopausal, Surgical

## 2018-09-26 NOTE — Research (Signed)
CADFEM Informed Consent                  Subject Name:   [name]   Subject met inclusion and exclusion criteria.  The informed consent form, study requirements and expectations were reviewed with the subject and questions and concerns were addressed prior to the signing of the consent form.  The subject verbalized understanding of the trial requirements.  The subject agreed to participate in the CADFEM trial and signed the informed consent.  The informed consent was obtained prior to performance of any protocol-specific procedures for the subject.  A copy of the signed informed consent was given to the subject and a copy was placed in the subject's medical record.   [place] , Research Assistant 09/26/18  11:03 a.m.

## 2018-09-27 ENCOUNTER — Ambulatory Visit (HOSPITAL_COMMUNITY)
Admission: RE | Admit: 2018-09-27 | Discharge: 2018-09-27 | Disposition: A | Discharge status: Home / Self Care | Payer: Medicare Other | Source: Ambulatory Visit | Attending: Internal Medicine | Admitting: Internal Medicine

## 2018-09-27 LAB — MYOCARDIAL PERFUSION IMAGING
LV dias vol: 47 mL (ref 46–106)
LV sys vol: 17 mL
NUC STRESS TID: 0.96
Peak HR: 118 {beats}/min
Rest HR: 78 {beats}/min
SDS: 18
SRS: 1
SSS: 19

## 2018-09-27 MED ORDER — TECHNETIUM TC 99M TETROFOSMIN IV KIT
30.3000 | PACK | Freq: Once | INTRAVENOUS | Status: AC | PRN
  Administered 2018-09-27: 30.3 via INTRAVENOUS

## 2018-10-11 ENCOUNTER — Encounter: Payer: Self-pay | Admitting: Internal Medicine

## 2018-10-11 ENCOUNTER — Ambulatory Visit (INDEPENDENT_AMBULATORY_CARE_PROVIDER_SITE_OTHER): Payer: Medicare Other | Admitting: Internal Medicine

## 2018-10-11 ENCOUNTER — Other Ambulatory Visit (INDEPENDENT_AMBULATORY_CARE_PROVIDER_SITE_OTHER): Payer: Medicare Other

## 2018-10-11 VITALS — BP 122/88 | HR 75 | Temp 98.0°F | Resp 16 | Ht 61.0 in | Wt 229.0 lb

## 2018-10-11 DIAGNOSIS — T50905A Adverse effect of unspecified drugs, medicaments and biological substances, initial encounter: Secondary | ICD-10-CM

## 2018-10-11 DIAGNOSIS — I10 Essential (primary) hypertension: Secondary | ICD-10-CM

## 2018-10-11 DIAGNOSIS — N183 Chronic kidney disease, stage 3 unspecified: Secondary | ICD-10-CM | POA: Insufficient documentation

## 2018-10-11 DIAGNOSIS — K5904 Chronic idiopathic constipation: Secondary | ICD-10-CM | POA: Diagnosis not present

## 2018-10-11 DIAGNOSIS — E876 Hypokalemia: Secondary | ICD-10-CM

## 2018-10-11 LAB — BASIC METABOLIC PANEL
BUN: 28 mg/dL — ABNORMAL HIGH (ref 6–23)
CO2: 31 mEq/L (ref 19–32)
Calcium: 9.8 mg/dL (ref 8.4–10.5)
Chloride: 96 mEq/L (ref 96–112)
Creatinine, Ser: 1.85 mg/dL — ABNORMAL HIGH (ref 0.40–1.20)
GFR: 36.1 mL/min — ABNORMAL LOW (ref >60.00)
Glucose, Bld: 105 mg/dL — ABNORMAL HIGH (ref 70–99)
POTASSIUM: 3.5 meq/L (ref 3.5–5.1)
Sodium: 137 mEq/L (ref 135–145)

## 2018-10-11 LAB — MAGNESIUM: Magnesium: 2.4 mg/dL (ref 1.5–2.5)

## 2018-10-11 NOTE — Progress Notes (Signed)
Subjective:  Patient ID: [name], female    DOB: 1961/01/08  Age: 57 y.o. MRN: [id number]  CC: Hypertension   HPI SHARIE AMORIN presents for a BP check - She tells me her blood pressure has been well controlled.  She has mild constipation related to taking tramadol but she gets adequate symptom relief with over-the-counter doses of MiraLAX.  She denies any recent episodes of dizziness, lightheadedness, CP, DOE, palpitations, edema, or fatigue.  Outpatient Medications Prior to Visit  Medication Sig Dispense Refill  . albuterol (PROVENTIL HFA;VENTOLIN HFA) 108 (90 Base) MCG/ACT inhaler Inhale 2 puffs into the lungs every 4 (four) hours as needed for wheezing or shortness of breath (cough, shortness of breath or wheezing.). 1 Inhaler 1  . aluminum chloride (DRYSOL) 20 % external solution Apply topically at bedtime. 60 mL 3  . mometasone-formoterol (DULERA) 100-5 MCG/ACT AERO Inhale 2 puffs into the lungs 2 (two) times daily. 13 g 11  . traMADol (ULTRAM) 50 MG tablet TAKE 1 TABLET BY MOUTH EVERY 6 HOURS AS NEEDED FOR PAIN 100 tablet 3  . triamterene-hydrochlorothiazide (MAXZIDE) 75-50 MG tablet Take 1 tablet by mouth daily. 90 tablet 0  . methylPREDNISolone (MEDROL DOSEPAK) 4 MG TBPK tablet Take as directed 21 tablet 0   No facility-administered medications prior to visit.     ROS Review of Systems  Constitutional: Negative for diaphoresis and fatigue.  HENT: Negative.  Negative for trouble swallowing.   Eyes: Negative.   Respiratory: Negative for cough, chest tightness, shortness of breath and wheezing.   Cardiovascular: Negative for chest pain, palpitations and leg swelling.  Gastrointestinal: Positive for constipation. Negative for abdominal pain, diarrhea, nausea and vomiting.  Genitourinary: Negative.  Negative for decreased urine volume, difficulty urinating and dysuria.  Musculoskeletal: Negative.  Negative for arthralgias and myalgias.  Skin: Negative.  Negative for  color change.  Neurological: Negative.  Negative for dizziness, weakness and light-headedness.  Hematological: Negative for adenopathy. Does not bruise/bleed easily.  Psychiatric/Behavioral: Negative.     Objective:  BP 122/88 (BP Location: Left Arm, Patient Position: Sitting, Cuff Size: Large)   Pulse 75   Temp 98 F (36.7 C) (Oral)   Resp 16   Ht 5\' 1"  (1.549 m)   Wt 229 lb (103.9 kg)   LMP 05/17/2012   SpO2 94%   BMI 43.27 kg/m   BP Readings from Last 3 Encounters:  10/12/18 132/81  10/11/18 122/88  09/21/18 132/88    Wt Readings from Last 3 Encounters:  10/11/18 229 lb (103.9 kg)  09/26/18 238 lb (108 kg)  09/21/18 236 lb (107 kg)    Physical Exam  Constitutional: She is oriented to person, place, and time. No distress.  HENT:  Mouth/Throat: Oropharynx is clear and moist. No oropharyngeal exudate.  Eyes: Conjunctivae are normal. No scleral icterus.  Neck: Normal range of motion. Neck supple. No JVD present. No thyromegaly present.  Cardiovascular: Normal rate, regular rhythm and normal heart sounds. Exam reveals no gallop.  No murmur heard. Pulmonary/Chest: Effort normal and breath sounds normal. She has no wheezes. She has no rales.  Abdominal: Soft. Bowel sounds are normal. She exhibits no mass. There is no hepatosplenomegaly. There is no tenderness.  Musculoskeletal: Normal range of motion. She exhibits no edema or deformity.  Lymphadenopathy:    She has no cervical adenopathy.  Neurological: She is alert and oriented to person, place, and time.  Skin: Skin is warm and dry. She is not diaphoretic. No pallor.  Vitals  reviewed.   Lab Results  Component Value Date   WBC 3.7 (L) 07/05/2018   HGB 13.3 07/05/2018   HCT 41.8 07/05/2018   PLT 227.0 07/05/2018   GLUCOSE 105 (H) 10/11/2018   CHOL 204 (H) 07/05/2018   TRIG 76.0 07/05/2018   HDL 48.00 07/05/2018   LDLCALC 141 (H) 07/05/2018   ALT 15 12/14/2016   AST 18 12/14/2016   NA 137 10/11/2018   K 3.5  10/11/2018   CL 96 10/11/2018   CREATININE 1.85 (H) 10/11/2018   BUN 28 (H) 10/11/2018   CO2 31 10/11/2018   TSH 1.20 07/05/2018    No results found.  Assessment & Plan:   Antonisha was seen today for hypertension.  Diagnoses and all orders for this visit:  Essential hypertension- Her blood pressure is adequately well controlled.  Electrolytes are normal.  She has had a slight decrease in her renal function. -     Basic metabolic panel; Future -     Magnesium; Future  Drug-induced hypokalemia- Her potassium level is normal now. -     Basic metabolic panel; Future -     Magnesium; Future  Chronic idiopathic constipation- Her labs are negative for secondary or metabolic causes.  She will continue MiraLAX as needed. -     Basic metabolic panel; Future -     Magnesium; Future  Chronic renal insufficiency, stage 3 (moderate) ([place])- Her creatinine clearance is down to 37.  I have asked her to see nephrology for evaluation and treatment of this. -     Ambulatory referral to Nephrology   I have discontinued Shamere P. Piccini's methylPREDNISolone. I am also having her maintain her aluminum chloride, albuterol, mometasone-formoterol, traMADol, and triamterene-hydrochlorothiazide.  No orders of the defined types were placed in this encounter.    Follow-up: Return in about 6 months (around 04/12/2019).  [name], MD

## 2018-10-11 NOTE — Patient Instructions (Signed)

## 2018-10-12 ENCOUNTER — Ambulatory Visit (INDEPENDENT_AMBULATORY_CARE_PROVIDER_SITE_OTHER): Payer: Self-pay

## 2018-10-12 ENCOUNTER — Ambulatory Visit (INDEPENDENT_AMBULATORY_CARE_PROVIDER_SITE_OTHER): Payer: Medicare Other | Admitting: Physical Medicine and Rehabilitation

## 2018-10-12 ENCOUNTER — Encounter (INDEPENDENT_AMBULATORY_CARE_PROVIDER_SITE_OTHER): Payer: Self-pay | Admitting: Physical Medicine and Rehabilitation

## 2018-10-12 VITALS — BP 132/81 | HR 87 | Temp 98.0°F

## 2018-10-12 DIAGNOSIS — M5416 Radiculopathy, lumbar region: Secondary | ICD-10-CM

## 2018-10-12 MED ORDER — METHYLPREDNISOLONE ACETATE 80 MG/ML IJ SUSP
80.0000 mg | Freq: Once | INTRAMUSCULAR | Status: AC
  Administered 2018-10-12: 80 mg

## 2018-10-12 NOTE — Patient Instructions (Signed)

## 2018-10-12 NOTE — Progress Notes (Signed)
.  Numeric Pain Rating Scale and Functional Assessment Average Pain 10   In the last MONTH (on 0-10 scale) has pain interfered with the following?  1. General activity like being  able to carry out your everyday physical activities such as walking, climbing stairs, carrying groceries, or moving a chair?  Rating(5)   +Driver, -BT, -Dye Allergies.  

## 2018-10-16 ENCOUNTER — Telehealth (INDEPENDENT_AMBULATORY_CARE_PROVIDER_SITE_OTHER): Payer: Self-pay | Admitting: Physical Medicine and Rehabilitation

## 2018-11-17 ENCOUNTER — Other Ambulatory Visit (INDEPENDENT_AMBULATORY_CARE_PROVIDER_SITE_OTHER): Payer: Self-pay | Admitting: Physical Medicine and Rehabilitation

## 2018-11-17 ENCOUNTER — Telehealth (INDEPENDENT_AMBULATORY_CARE_PROVIDER_SITE_OTHER): Payer: Self-pay | Admitting: Physical Medicine and Rehabilitation

## 2018-11-17 MED ORDER — BUPIVACAINE HCL 0.25 % IJ SOLN
4.0000 mL | INTRAMUSCULAR | Status: AC | PRN
  Administered 2018-09-26: 4 mL via INTRA_ARTICULAR

## 2018-11-17 MED ORDER — LIDOCAINE HCL 2 % IJ SOLN
4.0000 mL | INTRAMUSCULAR | Status: AC | PRN
  Administered 2018-09-26: 4 mL

## 2018-11-17 MED ORDER — TRIAMCINOLONE ACETONIDE 40 MG/ML IJ SUSP
80.0000 mg | INTRAMUSCULAR | Status: AC | PRN
  Administered 2018-09-26: 80 mg via INTRA_ARTICULAR

## 2018-11-17 NOTE — Telephone Encounter (Signed)
Advised to pt per Dr. Ernestina Patches note, and she seemed upset that Dr. Ernestina Patches could not rx her anything and she hung up.

## 2018-11-17 NOTE — Procedures (Signed)
Lumbar Epidural Steroid Injection - Interlaminar Approach with Fluoroscopic Guidance  Patient: [name]      Date of Birth: July 19, 1961 MRN: [id number] PCP: [name], MD      Visit Date: 10/12/2018   Universal Protocol:     Consent Given By: the patient  Position: PRONE  Additional Comments: Vital signs were monitored before and after the procedure. Patient was prepped and draped in the usual sterile fashion. The correct patient, procedure, and site was verified.   Injection Procedure Details:  Procedure Site One Meds Administered:  Meds ordered this encounter  Medications  . methylPREDNISolone acetate (DEPO-MEDROL) injection 80 mg     Laterality: Right  Location/Site:  L4-L5  Needle size: 20 G  Needle type: Tuohy  Needle Placement: Paramedian epidural  Findings:   -Comments: Excellent flow of contrast into the epidural space.  Procedure Details: Using a paramedian approach from the side mentioned above, the region overlying the inferior lamina was localized under fluoroscopic visualization and the soft tissues overlying this structure were infiltrated with 4 ml. of 1% Lidocaine without Epinephrine. The Tuohy needle was inserted into the epidural space using a paramedian approach.   The epidural space was localized using loss of resistance along with lateral and bi-planar fluoroscopic views.  After negative aspirate for air, blood, and CSF, a 2 ml. volume of Isovue-250 was injected into the epidural space and the flow of contrast was observed. Radiographs were obtained for documentation purposes.    The injectate was administered into the level noted above.   Additional Comments:  The patient tolerated the procedure well Dressing: Band-Aid    Post-procedure details: Patient was observed during the procedure. Post-procedure instructions were reviewed.  Patient left the clinic in stable condition.

## 2018-11-17 NOTE — Telephone Encounter (Signed)
On controlled substance database patient had 100 tablets of tramadol 50 mg filled on 11/10/2018.  These are provided by her primary care physician who had given her 3 refills of 100 tablets.  She should take these as directed by her primary care physician for pain.  Otherwise we can see her in follow-up she can follow-up with Dr. Wyline Copas.

## 2018-11-17 NOTE — Progress Notes (Signed)
[name] - 58 y.o. female MRN [id number]  Date of birth: 1960-12-09  Office Visit Note: Visit Date: 10/12/2018 PCP: [name], MD Referred by: [name], MD  Subjective: Chief Complaint  Patient presents with  . Lower Back - Pain  . Right Thigh - Pain  . Left Thigh - Pain   HPI:  [name] is a 58 y.o. female who comes in today For planned interlaminar epidural steroid injection at L4-5.  MRI evidence of facet arthropathy small disc bulging and some protrusion.  Rates her pain as 10 out of 10 but complicated by fibromyalgia.  She is followed by Dr. Eduard Roux from an orthopedic standpoint.  Has had prior history of lumbar pain and hip pain.  Consider facet joint injection if no relief.  May do better with comprehensive pain management.  ROS Otherwise per HPI.  Assessment & Plan: Visit Diagnoses:  1. Lumbar radiculopathy     Plan: No additional findings.   Meds & Orders:  Meds ordered this encounter  Medications  . methylPREDNISolone acetate (DEPO-MEDROL) injection 80 mg    Orders Placed This Encounter  Procedures  . XR C-ARM NO REPORT  . Epidural Steroid injection    Follow-up: Return if symptoms worsen or fail to improve.   Procedures: No procedures performed  Lumbar Epidural Steroid Injection - Interlaminar Approach with Fluoroscopic Guidance  Patient: [name]      Date of Birth: 11-May-1961 MRN: [id number] PCP: [name], MD      Visit Date: 10/12/2018   Universal Protocol:     Consent Given By: the patient  Position: PRONE  Additional Comments: Vital signs were monitored before and after the procedure. Patient was prepped and draped in the usual sterile fashion. The correct patient, procedure, and site was verified.   Injection Procedure Details:  Procedure Site One Meds Administered:  Meds ordered this encounter  Medications  . methylPREDNISolone acetate (DEPO-MEDROL) injection 80 mg     Laterality:  Right  Location/Site:  L4-L5  Needle size: 20 G  Needle type: Tuohy  Needle Placement: Paramedian epidural  Findings:   -Comments: Excellent flow of contrast into the epidural space.  Procedure Details: Using a paramedian approach from the side mentioned above, the region overlying the inferior lamina was localized under fluoroscopic visualization and the soft tissues overlying this structure were infiltrated with 4 ml. of 1% Lidocaine without Epinephrine. The Tuohy needle was inserted into the epidural space using a paramedian approach.   The epidural space was localized using loss of resistance along with lateral and bi-planar fluoroscopic views.  After negative aspirate for air, blood, and CSF, a 2 ml. volume of Isovue-250 was injected into the epidural space and the flow of contrast was observed. Radiographs were obtained for documentation purposes.    The injectate was administered into the level noted above.   Additional Comments:  The patient tolerated the procedure well Dressing: Band-Aid    Post-procedure details: Patient was observed during the procedure. Post-procedure instructions were reviewed.  Patient left the clinic in stable condition.   Clinical History: MRI LUMBAR SPINE WITHOUT CONTRAST  TECHNIQUE: Multiplanar, multisequence MR imaging of the lumbar spine was performed. No intravenous contrast was administered.  COMPARISON:  None.  FINDINGS: Segmentation:  Standard.  Alignment:  2 mm anterolisthesis of L4 on L5.  Vertebrae:  No fracture, evidence of discitis, or bone lesion.  Conus medullaris: Extends to the L1 level and appears normal.  Paraspinal  and other soft tissues: No paraspinal abnormality.  Disc levels:  Disc spaces: Mild disc desiccation at L3-4 and L4-5.  T12-L1: No significant disc bulge. No evidence of neural foraminal stenosis. No central canal stenosis.  L1-L2: No significant disc bulge. No evidence of neural  foraminal stenosis. No central canal stenosis.  L2-L3: No significant disc bulge. No evidence of neural foraminal stenosis. No central canal stenosis.  L3-L4: Mild broad-based disc bulge. No evidence of neural foraminal stenosis. No central canal stenosis.  L4-L5: Mild broad-based disc bulge. Moderate bilateral facet arthropathy. Mild bilateral foraminal narrowing. No central canal stenosis.  L5-S1: No significant disc bulge. Mild bilateral facet arthropathy. No evidence of neural foraminal stenosis. No central canal stenosis.  IMPRESSION: 1. L4-5 there is a mild broad-based disc bulge. Moderate bilateral facet arthropathy. Mild bilateral foraminal narrowing. 2. At L5-S1 there is mild bilateral facet arthropathy.   Electronically Signed   By: Kathreen Devoid   On: 03/14/2017 15:02     Objective:  VS:  HT:    WT:   BMI:     BP:132/81  HR:87bpm  TEMP:98 F (36.7 C)(Oral)  RESP:  Physical Exam  Ortho Exam Imaging: No results found.

## 2018-11-17 NOTE — Progress Notes (Signed)
100 tablets of tramadol filled on 11/10/2018 seen on the [facility] website.  Patient should continue take that for pain control.

## 2018-11-28 DIAGNOSIS — I129 Hypertensive chronic kidney disease with stage 1 through stage 4 chronic kidney disease, or unspecified chronic kidney disease: Secondary | ICD-10-CM | POA: Diagnosis not present

## 2018-11-28 DIAGNOSIS — N179 Acute kidney failure, unspecified: Secondary | ICD-10-CM | POA: Diagnosis not present

## 2018-11-28 DIAGNOSIS — N183 Chronic kidney disease, stage 3 (moderate): Secondary | ICD-10-CM | POA: Diagnosis not present

## 2018-11-30 ENCOUNTER — Other Ambulatory Visit: Payer: Self-pay | Admitting: Internal Medicine

## 2018-11-30 DIAGNOSIS — N183 Chronic kidney disease, stage 3 unspecified: Secondary | ICD-10-CM

## 2018-12-04 ENCOUNTER — Ambulatory Visit
Admission: RE | Admit: 2018-12-04 | Discharge: 2018-12-04 | Disposition: A | Discharge status: Home / Self Care | Payer: Medicare Other | Source: Ambulatory Visit | Attending: Internal Medicine | Admitting: Internal Medicine

## 2018-12-04 DIAGNOSIS — N183 Chronic kidney disease, stage 3 unspecified: Secondary | ICD-10-CM

## 2018-12-13 ENCOUNTER — Other Ambulatory Visit: Payer: Self-pay | Admitting: Internal Medicine

## 2018-12-13 DIAGNOSIS — T50905A Adverse effect of unspecified drugs, medicaments and biological substances, initial encounter: Secondary | ICD-10-CM

## 2018-12-13 DIAGNOSIS — I10 Essential (primary) hypertension: Secondary | ICD-10-CM

## 2018-12-13 DIAGNOSIS — E876 Hypokalemia: Secondary | ICD-10-CM

## 2018-12-13 MED ORDER — TRIAMTERENE-HCTZ 75-50 MG PO TABS: 1.0000 | ORAL_TABLET | Freq: Every day | ORAL | 0 refills | Status: DC

## 2018-12-13 NOTE — Telephone Encounter (Signed)
Copied from [place] [id number]. Topic: Quick Communication - Rx Refill/Question >> Dec 13, 2018  9:26 AM [place], Lanice Schwab wrote: Medication: triamterene-hydrochlorothiazide (MAXZIDE) 75-50 MG tablet  Has the patient contacted their pharmacy? Yes and was advised to call Dr. For authorization   Preferred Pharmacy (with phone number or street name): [facility], [place] - [address]  Agent: Please be advised that RX refills may take up to 3 business days. We ask that you follow-up with your pharmacy.

## 2018-12-13 NOTE — Telephone Encounter (Signed)
PCP aware of abnormal labs. Requested Prescriptions  Pending Prescriptions Disp Refills  . triamterene-hydrochlorothiazide (MAXZIDE) 75-50 MG tablet 90 tablet 0    Sig: Take 1 tablet by mouth daily.     Cardiovascular: Diuretic Combos Failed - 12/13/2018  9:49 AM      Failed - Cr in normal range and within 360 days    Creatinine, Ser  Date Value Ref Range Status  10/11/2018 1.85 (H) 0.40 - 1.20 mg/dL Final         Passed - K in normal range and within 360 days    Potassium  Date Value Ref Range Status  10/11/2018 3.5 3.5 - 5.1 mEq/L Final         Passed - Na in normal range and within 360 days    Sodium  Date Value Ref Range Status  10/11/2018 137 135 - 145 mEq/L Final         Passed - Ca in normal range and within 360 days    Calcium  Date Value Ref Range Status  10/11/2018 9.8 8.4 - 10.5 mg/dL Final         Passed - Last BP in normal range    BP Readings from Last 1 Encounters:  10/12/18 132/81         Passed - Valid encounter within last 6 months    Recent Outpatient Visits          2 months ago Essential hypertension   [place], MD   2 months ago Constipation, unspecified constipation type   [place], FNP   3 months ago Drug-induced hypokalemia   [place], Thomas L, MD   3 months ago Essential hypertension   [place], Thomas L, MD   5 months ago Essential hypertension   [place], Marvis Repress, [facility]

## 2018-12-14 NOTE — Telephone Encounter (Signed)
Closing encounter

## 2018-12-21 DIAGNOSIS — N179 Acute kidney failure, unspecified: Secondary | ICD-10-CM | POA: Diagnosis not present

## 2018-12-21 DIAGNOSIS — I129 Hypertensive chronic kidney disease with stage 1 through stage 4 chronic kidney disease, or unspecified chronic kidney disease: Secondary | ICD-10-CM | POA: Diagnosis not present

## 2018-12-25 DIAGNOSIS — I129 Hypertensive chronic kidney disease with stage 1 through stage 4 chronic kidney disease, or unspecified chronic kidney disease: Secondary | ICD-10-CM | POA: Diagnosis not present

## 2018-12-25 DIAGNOSIS — N183 Chronic kidney disease, stage 3 (moderate): Secondary | ICD-10-CM | POA: Diagnosis not present

## 2019-01-02 ENCOUNTER — Ambulatory Visit
Admission: RE | Admit: 2019-01-02 | Discharge: 2019-01-02 | Disposition: A | Discharge status: Home / Self Care | Payer: Medicare Other | Source: Ambulatory Visit | Attending: Internal Medicine | Admitting: Internal Medicine

## 2019-01-02 DIAGNOSIS — Z1231 Encounter for screening mammogram for malignant neoplasm of breast: Secondary | ICD-10-CM

## 2019-01-02 LAB — HM MAMMOGRAPHY

## 2019-01-16 ENCOUNTER — Other Ambulatory Visit: Payer: Self-pay | Admitting: Internal Medicine

## 2019-01-16 DIAGNOSIS — M5126 Other intervertebral disc displacement, lumbar region: Secondary | ICD-10-CM

## 2019-01-16 MED ORDER — TRAMADOL HCL 50 MG PO TABS: 50.0000 mg | ORAL_TABLET | Freq: Four times a day (QID) | ORAL | 3 refills | Status: DC | PRN

## 2019-01-16 NOTE — Telephone Encounter (Signed)
Copied from [place] [id number]. Topic: Quick Communication - Rx Refill/Question >> Jan 16, 2019 10:36 AM Gustavus Messing wrote: Medication: traMADol (ULTRAM) 50 MG tablet   Has the patient contacted their pharmacy? No. (Agent: If no, request that the patient contact the pharmacy for the refill.) Requesting a refill from Dr. Ronnald Ramp   Preferred Pharmacy (with phone number or street name): [facility] [place], [place] - [address] [phone] (Phone) [phone] (Fax)    Agent: Please be advised that RX refills may take up to 3 business days. We ask that you follow-up with your pharmacy.

## 2019-03-06 ENCOUNTER — Telehealth: Payer: Self-pay | Admitting: Internal Medicine

## 2019-03-06 NOTE — Telephone Encounter (Signed)
Copied from [place] [id number]. Topic: Quick Communication - Rx Refill/Question >> Mar 06, 2019  9:26 AM Sheran Luz wrote: Medication: triamterene-hydrochlorothiazide (MAXZIDE) 75-50 MG tablet   Patient is requesting a refill.   Preferred Pharmacy (with phone number or street name): [place], [place] - [address] [phone] (Phone) [phone] [facility].

## 2019-03-07 ENCOUNTER — Other Ambulatory Visit: Payer: Self-pay | Admitting: Internal Medicine

## 2019-03-07 DIAGNOSIS — T50905A Adverse effect of unspecified drugs, medicaments and biological substances, initial encounter: Secondary | ICD-10-CM

## 2019-03-07 DIAGNOSIS — I10 Essential (primary) hypertension: Secondary | ICD-10-CM

## 2019-03-07 DIAGNOSIS — E876 Hypokalemia: Secondary | ICD-10-CM

## 2019-03-07 MED ORDER — TRIAMTERENE-HCTZ 75-50 MG PO TABS: 1.0000 | ORAL_TABLET | Freq: Every day | ORAL | 0 refills | Status: DC

## 2019-04-14 DIAGNOSIS — H524 Presbyopia: Secondary | ICD-10-CM | POA: Diagnosis not present

## 2019-04-14 DIAGNOSIS — H5203 Hypermetropia, bilateral: Secondary | ICD-10-CM | POA: Diagnosis not present

## 2019-05-21 ENCOUNTER — Telehealth: Payer: Self-pay | Admitting: Internal Medicine

## 2019-05-21 ENCOUNTER — Other Ambulatory Visit: Payer: Self-pay | Admitting: Internal Medicine

## 2019-05-21 DIAGNOSIS — E876 Hypokalemia: Secondary | ICD-10-CM

## 2019-05-21 DIAGNOSIS — M5126 Other intervertebral disc displacement, lumbar region: Secondary | ICD-10-CM

## 2019-05-21 DIAGNOSIS — I10 Essential (primary) hypertension: Secondary | ICD-10-CM

## 2019-05-21 MED ORDER — TRIAMTERENE-HCTZ 75-50 MG PO TABS: 1.0000 | ORAL_TABLET | Freq: Every day | ORAL | 0 refills | Status: DC

## 2019-05-21 MED ORDER — TRAMADOL HCL 50 MG PO TABS: 50.0000 mg | ORAL_TABLET | Freq: Four times a day (QID) | ORAL | 3 refills | Status: DC | PRN

## 2019-05-21 NOTE — Telephone Encounter (Signed)
Medication Refill - Medication:   traMADol (ULTRAM) 50 MG tablet    triamterene-hydrochlorothiazide (MAXZIDE) 75-50 MG tablet      Has the patient contacted their pharmacy? Yes.   (Agent: If no, request that the patient contact the pharmacy for the refill.) (Agent: If yes, when and what did the pharmacy advise?)  Preferred Pharmacy (with phone number or street name):  [place] ([place]), [place] - [address] [phone] (Phone) [phone] (Fax)     Agent: Please be advised that RX refills may take up to 3 business days. We ask that you follow-up with your pharmacy.

## 2019-06-21 ENCOUNTER — Encounter: Payer: Self-pay | Admitting: Internal Medicine

## 2019-06-21 ENCOUNTER — Ambulatory Visit (INDEPENDENT_AMBULATORY_CARE_PROVIDER_SITE_OTHER): Payer: Medicare Other | Admitting: Internal Medicine

## 2019-06-21 ENCOUNTER — Other Ambulatory Visit: Payer: Self-pay

## 2019-06-21 ENCOUNTER — Other Ambulatory Visit (INDEPENDENT_AMBULATORY_CARE_PROVIDER_SITE_OTHER): Payer: Medicare Other

## 2019-06-21 VITALS — BP 112/88 | HR 80 | Temp 97.9°F | Ht 61.0 in | Wt 219.0 lb

## 2019-06-21 DIAGNOSIS — N183 Chronic kidney disease, stage 3 unspecified: Secondary | ICD-10-CM

## 2019-06-21 LAB — BASIC METABOLIC PANEL
BUN: 27 — AB (ref 4–21)
Creatinine: 1.9 — AB (ref 0.5–1.1)
Potassium: 3.6 (ref 3.4–5.3)
Sodium: 138 (ref 137–147)

## 2019-06-21 LAB — RENAL FUNCTION PANEL
Albumin: 4.2 g/dL (ref 3.5–5.2)
BUN: 27 mg/dL — ABNORMAL HIGH (ref 6–23)
CO2: 30 mEq/L (ref 19–32)
Calcium: 9.7 mg/dL (ref 8.4–10.5)
Chloride: 99 mEq/L (ref 96–112)
Creatinine, Ser: 1.94 mg/dL — ABNORMAL HIGH (ref 0.40–1.20)
GFR: 32.07 mL/min — ABNORMAL LOW (ref >60.00)
Glucose, Bld: 102 mg/dL — ABNORMAL HIGH (ref 70–99)
Phosphorus: 2.7 mg/dL (ref 2.3–4.6)
Potassium: 3.6 mEq/L (ref 3.5–5.1)
Sodium: 138 mEq/L (ref 135–145)

## 2019-06-21 NOTE — Assessment & Plan Note (Signed)
She has stable Cr at nephrology visit and she did not return as instructed to their office. She has not reduced dose of hctz as advised. Checking renal function and PTH today. May need adjustment of medications. Could be related to her intermittent edema. Advised to elevate, DASH diet, and compression stockings. Given CKD would not add or increase fluid pill for swelling.

## 2019-06-21 NOTE — Progress Notes (Signed)
   Subjective:   Patient ID: [name], female    DOB: [birth year], 58 y.o.   MRN: [id number]  HPI The patient is a 58 YO female coming in for bilateral leg swelling. She is having some irritation and itching around her ankles. Started some time ago and is waxing and waning. Denies SOB with lying flat or walking. Some blisters with leaking rarely. She was noted to have kidney problems Dec 2019 and sent to nephrology with GFR right above 30. She did see them once and get renal US. They had advised to lower hctz to 25 mg daily which she did perhaps. She thinks her PCP raised it back up for some reason. Overall it is stable. She has compression stockings but does not use them often. Denies skin color change. Has tried compression stockings and elevating legs. Her hctz is making her urinate more when she takes it.   PMH, [facility], social history reviewed and updated  Review of Systems  Constitutional: Negative.   HENT: Negative.   Eyes: Negative.   Respiratory: Negative for cough, chest tightness and shortness of breath.   Cardiovascular: Positive for leg swelling. Negative for chest pain and palpitations.  Gastrointestinal: Negative for abdominal distention, abdominal pain, constipation, diarrhea, nausea and vomiting.  Musculoskeletal: Negative.   Skin: Negative.   Neurological: Negative.   Psychiatric/Behavioral: Negative.     Objective:  Physical Exam Constitutional:      Appearance: She is well-developed.  HENT:     Head: Normocephalic and atraumatic.  Neck:     Musculoskeletal: Normal range of motion.  Cardiovascular:     Rate and Rhythm: Normal rate and regular rhythm.  Pulmonary:     Effort: Pulmonary effort is normal. No respiratory distress.     Breath sounds: Normal breath sounds. No wheezing or rales.  Abdominal:     General: Bowel sounds are normal. There is no distension.     Palpations: Abdomen is soft.     Tenderness: There is no abdominal tenderness. There is no  rebound.  Musculoskeletal:     Comments: Minimal edema top of feet, without pitting  Skin:    General: Skin is warm and dry.  Neurological:     Mental Status: She is alert and oriented to person, place, and time.     Coordination: Coordination normal.     Vitals:   06/21/19 1357  BP: 112/88  Pulse: 80  Temp: 97.9 F (36.6 C)  TempSrc: Oral  SpO2: 97%  Weight: 219 lb (99.3 kg)  Height: 5\' 1"  (1.549 m)    Assessment & Plan:  Visit time 25 minutes: greater than 50% of that time was spent in face to face counseling and coordination of care with the patient: counseled about the serious and progressive nature of CKD, nature and likely etiology of swelling as well as DASH diet and compression stockings.

## 2019-06-21 NOTE — Patient Instructions (Addendum)
We are checking the labs today and checking the kidney numbers.   Chronic Kidney Disease, Adult Chronic kidney disease (CKD) happens when the kidneys are damaged over a long period of time. The kidneys are two organs that help with:  Getting rid of waste and extra fluid from the blood.  Making hormones that maintain the amount of fluid in your tissues and blood vessels.  Making sure that the body has the right amount of fluids and chemicals. Most of the time, CKD does not go away, but it can usually be controlled. Steps must be taken to slow down the kidney damage or to stop it from getting worse. If this is not done, the kidneys may stop working. Follow these instructions at home: Medicines  Take over-the-counter and prescription medicines only as told by your doctor. You may need to change the amount of medicines you take.  Do not take any new medicines unless your doctor says it is okay. Many medicines can make your kidney damage worse.  Do not take any vitamin and supplements unless your doctor says it is okay. Many vitamins and supplements can make your kidney damage worse. General instructions  Follow a diet as told by your doctor. You may need to stay away from: ? Alcohol. ? Salty foods. ? Foods that are high in:  Potassium.  Calcium.  Protein.  Do not use any products that contain nicotine or tobacco, such as cigarettes and e-cigarettes. If you need help quitting, ask your doctor.  Keep track of your blood pressure at home. Tell your doctor about any changes.  If you have diabetes, keep track of your blood sugar as told by your doctor.  Try to stay at a healthy weight. If you need help, ask your doctor.  Exercise at least 30 minutes a day, 5 days a week.  Stay up-to-date with your shots (immunizations) as told by your doctor.  Keep all follow-up visits as told by your doctor. This is important. Contact a doctor if:  Your symptoms get worse.  You have new  symptoms. Get help right away if:  You have symptoms of end-stage kidney disease. These may include: ? Headaches. ? Numbness in your hands or feet. ? Easy bruising. ? Having hiccups often. ? Chest pain. ? Shortness of breath. ? Stopping of menstrual periods in women.  You have a fever.  You have very little pee (urine).  You have pain or bleeding when you pee. Summary  Chronic kidney disease (CKD) happens when the kidneys are damaged over a long period of time.  Most of the time, this condition does not go away, but it can usually be controlled. Steps must be taken to slow down the kidney damage or to stop it from getting worse.  Treatment may include a combination of medicines and lifestyle changes. This information is not intended to replace advice given to you by your health care provider. Make sure you discuss any questions you have with your health care provider. Document Released: 01/19/2010 Document Revised: 10/07/2017 Document Reviewed: 11/29/2016 Elsevier Patient Education  2020 [place] DASH stands for "Dietary Approaches to Stop Hypertension." The DASH eating plan is a healthy eating plan that has been shown to reduce high blood pressure (hypertension). It may also reduce your risk for type 2 diabetes, heart disease, and stroke. The DASH eating plan may also help with weight loss. What are tips for following this plan?  General guidelines  Avoid eating  more than 2,300 mg (milligrams) of salt (sodium) a day. If you have hypertension, you may need to reduce your sodium intake to 1,500 mg a day.  Limit alcohol intake to no more than 1 drink a day for nonpregnant women and 2 drinks a day for men. One drink equals 12 oz of beer, 5 oz of wine, or 1 oz of hard liquor.  Work with your health care provider to maintain a healthy body weight or to lose weight. Ask what an ideal weight is for you.  Get at least 30 minutes of exercise that causes your  heart to beat faster (aerobic exercise) most days of the week. Activities may include walking, swimming, or biking.  Work with your health care provider or diet and nutrition specialist (dietitian) to adjust your eating plan to your individual calorie needs. Reading food labels   Check food labels for the amount of sodium per serving. Choose foods with less than 5 percent of the Daily Value of sodium. Generally, foods with less than 300 mg of sodium per serving fit into this eating plan.  To find whole grains, look for the word "whole" as the first word in the ingredient list. Shopping  Buy products labeled as "low-sodium" or "no salt added."  Buy fresh foods. Avoid canned foods and premade or frozen meals. Cooking  Avoid adding salt when cooking. Use salt-free seasonings or herbs instead of table salt or sea salt. Check with your health care provider or pharmacist before using salt substitutes.  Do not fry foods. Cook foods using healthy methods such as baking, boiling, grilling, and broiling instead.  Cook with heart-healthy oils, such as olive, canola, soybean, or sunflower oil. Meal planning  Eat a balanced diet that includes: ? 5 or more servings of fruits and vegetables each day. At each meal, try to fill half of your plate with fruits and vegetables. ? Up to 6-8 servings of whole grains each day. ? Less than 6 oz of lean meat, poultry, or fish each day. A 3-oz serving of meat is about the same size as a deck of cards. One egg equals 1 oz. ? 2 servings of low-fat dairy each day. ? A serving of nuts, seeds, or beans 5 times each week. ? Heart-healthy fats. Healthy fats called Omega-3 fatty acids are found in foods such as flaxseeds and coldwater fish, like sardines, salmon, and mackerel.  Limit how much you eat of the following: ? Canned or prepackaged foods. ? Food that is high in trans fat, such as fried foods. ? Food that is high in saturated fat, such as fatty meat. ?  Sweets, desserts, sugary drinks, and other foods with added sugar. ? Full-fat dairy products.  Do not salt foods before eating.  Try to eat at least 2 vegetarian meals each week.  Eat more home-cooked food and less restaurant, buffet, and fast food.  When eating at a restaurant, ask that your food be prepared with less salt or no salt, if possible. What foods are recommended? The items listed may not be a complete list. Talk with your dietitian about what dietary choices are best for you. Grains Whole-grain or whole-wheat bread. Whole-grain or whole-wheat pasta. Brown rice. [place]. Bulgur. Whole-grain and low-sodium cereals. Pita bread. Low-fat, low-sodium crackers. Whole-wheat flour tortillas. Vegetables Fresh or frozen vegetables (raw, steamed, roasted, or grilled). Low-sodium or reduced-sodium tomato and vegetable juice. Low-sodium or reduced-sodium tomato sauce and tomato paste. Low-sodium or reduced-sodium canned vegetables. Fruits All fresh,  dried, or frozen fruit. Canned fruit in natural juice (without added sugar). Meat and other protein foods Skinless chicken or [place]. Ground chicken or [place]. Pork with fat trimmed off. Fish and seafood. Egg whites. Dried beans, peas, or lentils. Unsalted nuts, nut butters, and seeds. Unsalted canned beans. Lean cuts of beef with fat trimmed off. Low-sodium, lean deli meat. Dairy Low-fat (1%) or fat-free (skim) milk. Fat-free, low-fat, or reduced-fat cheeses. Nonfat, low-sodium ricotta or cottage cheese. Low-fat or nonfat yogurt. Low-fat, low-sodium cheese. Fats and oils Soft margarine without trans fats. Vegetable oil. Low-fat, reduced-fat, or light mayonnaise and salad dressings (reduced-sodium). Canola, safflower, olive, soybean, and sunflower oils. Avocado. Seasoning and other foods Herbs. Spices. Seasoning mixes without salt. Unsalted popcorn and pretzels. Fat-free sweets. What foods are not recommended? The items listed may not be  a complete list. Talk with your dietitian about what dietary choices are best for you. Grains Baked goods made with fat, such as croissants, muffins, or some breads. Dry pasta or rice meal packs. Vegetables Creamed or fried vegetables. Vegetables in a cheese sauce. Regular canned vegetables (not low-sodium or reduced-sodium). Regular canned tomato sauce and paste (not low-sodium or reduced-sodium). Regular tomato and vegetable juice (not low-sodium or reduced-sodium). Angie Fava. Olives. Fruits Canned fruit in a light or heavy syrup. Fried fruit. Fruit in cream or butter sauce. Meat and other protein foods Fatty cuts of meat. Ribs. Fried meat. Berniece Salines. Sausage. Bologna and other processed lunch meats. Salami. Fatback. Hotdogs. Bratwurst. Salted nuts and seeds. Canned beans with added salt. Canned or smoked fish. Whole eggs or egg yolks. Chicken or [place] with skin. Dairy Whole or 2% milk, cream, and half-and-half. Whole or full-fat cream cheese. Whole-fat or sweetened yogurt. Full-fat cheese. Nondairy creamers. Whipped toppings. Processed cheese and cheese spreads. Fats and oils Butter. Stick margarine. Lard. Shortening. Ghee. Bacon fat. Tropical oils, such as coconut, palm kernel, or palm oil. Seasoning and other foods Salted popcorn and pretzels. Onion salt, garlic salt, seasoned salt, table salt, and sea salt. Worcestershire sauce. Tartar sauce. Barbecue sauce. Teriyaki sauce. Soy sauce, including reduced-sodium. Steak sauce. Canned and packaged gravies. Fish sauce. Oyster sauce. Cocktail sauce. Horseradish that you find on the shelf. Ketchup. Mustard. Meat flavorings and tenderizers. Bouillon cubes. Hot sauce and Tabasco sauce. Premade or packaged marinades. Premade or packaged taco seasonings. Relishes. Regular salad dressings. Where to find more information:  National Heart, Lung, and [place]: https://wilson-eaton.com/  American Heart Association: www.heart.org Summary  The DASH eating plan is  a healthy eating plan that has been shown to reduce high blood pressure (hypertension). It may also reduce your risk for type 2 diabetes, heart disease, and stroke.  With the DASH eating plan, you should limit salt (sodium) intake to 2,300 mg a day. If you have hypertension, you may need to reduce your sodium intake to 1,500 mg a day.  When on the DASH eating plan, aim to eat more fresh fruits and vegetables, whole grains, lean proteins, low-fat dairy, and heart-healthy fats.  Work with your health care provider or diet and nutrition specialist (dietitian) to adjust your eating plan to your individual calorie needs. This information is not intended to replace advice given to you by your health care provider. Make sure you discuss any questions you have with your health care provider. Document Released: 10/14/2011 Document Revised: 10/07/2017 Document Reviewed: 10/18/2016 Elsevier Patient Education  2020 [employer].

## 2019-06-22 LAB — PTH, INTACT AND CALCIUM
Calcium: 9.7 mg/dL (ref 8.6–10.4)
PTH: 117 pg/mL — ABNORMAL HIGH (ref 14–64)

## 2019-06-27 DIAGNOSIS — R21 Rash and other nonspecific skin eruption: Secondary | ICD-10-CM | POA: Diagnosis not present

## 2019-06-27 DIAGNOSIS — R609 Edema, unspecified: Secondary | ICD-10-CM | POA: Diagnosis not present

## 2019-06-27 DIAGNOSIS — N183 Chronic kidney disease, stage 3 (moderate): Secondary | ICD-10-CM | POA: Diagnosis not present

## 2019-06-27 DIAGNOSIS — I129 Hypertensive chronic kidney disease with stage 1 through stage 4 chronic kidney disease, or unspecified chronic kidney disease: Secondary | ICD-10-CM | POA: Diagnosis not present

## 2019-07-04 ENCOUNTER — Telehealth: Payer: Self-pay | Admitting: Internal Medicine

## 2019-07-04 ENCOUNTER — Other Ambulatory Visit: Payer: Self-pay

## 2019-07-04 ENCOUNTER — Ambulatory Visit (INDEPENDENT_AMBULATORY_CARE_PROVIDER_SITE_OTHER): Payer: Medicare Other | Admitting: Internal Medicine

## 2019-07-04 ENCOUNTER — Encounter: Payer: Self-pay | Admitting: Internal Medicine

## 2019-07-04 ENCOUNTER — Other Ambulatory Visit: Payer: Medicare Other

## 2019-07-04 VITALS — BP 120/80 | HR 82 | Temp 98.1°F | Ht 61.0 in | Wt 222.0 lb

## 2019-07-04 DIAGNOSIS — L2084 Intrinsic (allergic) eczema: Secondary | ICD-10-CM | POA: Diagnosis not present

## 2019-07-04 DIAGNOSIS — T7840XA Allergy, unspecified, initial encounter: Secondary | ICD-10-CM | POA: Diagnosis not present

## 2019-07-04 DIAGNOSIS — R21 Rash and other nonspecific skin eruption: Secondary | ICD-10-CM | POA: Diagnosis not present

## 2019-07-04 DIAGNOSIS — Z23 Encounter for immunization: Secondary | ICD-10-CM | POA: Diagnosis not present

## 2019-07-04 MED ORDER — METHYLPREDNISOLONE 4 MG PO TBPK: ORAL_TABLET | ORAL | 0 refills | Status: AC

## 2019-07-04 MED ORDER — FLUOCINONIDE-E 0.05 % EX CREA: 1.0000 "application " | TOPICAL_CREAM | Freq: Two times a day (BID) | CUTANEOUS | 2 refills | Status: DC

## 2019-07-04 MED ORDER — LEVOCETIRIZINE DIHYDROCHLORIDE 5 MG PO TABS: 5.0000 mg | ORAL_TABLET | Freq: Every evening | ORAL | 1 refills | Status: DC

## 2019-07-04 MED ORDER — TRIAMCINOLONE ACETONIDE 0.5 % EX CREA: 1.0000 "application " | TOPICAL_CREAM | Freq: Three times a day (TID) | CUTANEOUS | 1 refills | Status: DC

## 2019-07-04 NOTE — Telephone Encounter (Signed)
Relation to pt: self  Call back number: [phone]  Pharmacy:  [place] ([place]), [place] - [address] [phone] (Phone) [phone] (Fax)     Reason for call:  Patient states fluocinonide-emollient (LIDEX-E) 0.05 % cream prescribed today by Dr. Ronnald Ramp is expensive $80 and requesting alternate or if PCP has samples in office, please advise

## 2019-07-04 NOTE — Progress Notes (Signed)
Subjective:  Patient ID: [name], female    DOB: Mar 20, 1961  Age: 58 y.o. MRN: [id number]  CC: Rash   HPI [name] presents for concerns about a pruritic rash for 1 month.  She has a history of eczema but tells me there is something different about this rash.  The lesions are more numerous and more itchy than usual.  She has not gotten much symptom relief with over-the-counter topical cortisone cream.  She has lesions on both legs, her left upper extremity, and her upper back.  Outpatient Medications Prior to Visit  Medication Sig Dispense Refill   albuterol (PROVENTIL HFA;VENTOLIN HFA) 108 (90 Base) MCG/ACT inhaler Inhale 2 puffs into the lungs every 4 (four) hours as needed for wheezing or shortness of breath (cough, shortness of breath or wheezing.). 1 Inhaler 1   aluminum chloride (DRYSOL) 20 % external solution Apply topically at bedtime. 60 mL 3   mometasone-formoterol (DULERA) 100-5 MCG/ACT AERO Inhale 2 puffs into the lungs 2 (two) times daily. 13 g 11   traMADol (ULTRAM) 50 MG tablet Take 1 tablet (50 mg total) by mouth every 6 (six) hours as needed. for pain 100 tablet 3   triamterene-hydrochlorothiazide (MAXZIDE) 75-50 MG tablet Take 1 tablet by mouth daily. 90 tablet 0   No facility-administered medications prior to visit.     ROS Review of Systems  Constitutional: Negative.  Negative for chills, fatigue and fever.  HENT: Negative.   Eyes: Negative for visual disturbance.  Respiratory: Negative for cough, chest tightness, shortness of breath and wheezing.   Cardiovascular: Negative for chest pain, palpitations and leg swelling.  Gastrointestinal: Negative for abdominal pain, constipation, diarrhea, nausea and vomiting.  Endocrine: Negative.   Genitourinary: Negative.  Negative for difficulty urinating.  Musculoskeletal: Negative.  Negative for arthralgias, back pain, myalgias and neck pain.  Skin: Positive for rash. Negative for color change.    Neurological: Negative for dizziness, weakness, light-headedness and headaches.  Hematological: Negative for adenopathy. Does not bruise/bleed easily.  Psychiatric/Behavioral: Negative.     Objective:  BP 120/80 (BP Location: Left Arm, Patient Position: Sitting, Cuff Size: Large)    Pulse 82    Temp 98.1 F (36.7 C) (Oral)    Ht 5\' 1"  (1.549 m)    Wt 222 lb (100.7 kg)    LMP 05/17/2012    SpO2 97%    BMI 41.95 kg/m   BP Readings from Last 3 Encounters:  07/04/19 120/80  06/21/19 112/88  10/12/18 132/81    Wt Readings from Last 3 Encounters:  07/04/19 222 lb (100.7 kg)  06/21/19 219 lb (99.3 kg)  10/11/18 229 lb (103.9 kg)    Physical Exam Vitals signs reviewed.  Constitutional:      Appearance: She is obese. She is not ill-appearing or diaphoretic.  HENT:     Nose: Nose normal.     Mouth/Throat:     Mouth: Mucous membranes are moist.  Eyes:     Conjunctiva/sclera: Conjunctivae normal.  Neck:     Musculoskeletal: Normal range of motion and neck supple.  Cardiovascular:     Rate and Rhythm: Normal rate and regular rhythm.     Heart sounds: No murmur.  Pulmonary:     Effort: Pulmonary effort is normal.     Breath sounds: No stridor. No wheezing, rhonchi or rales.  Abdominal:     General: Abdomen is protuberant. Bowel sounds are normal.     Palpations: There is no hepatomegaly or splenomegaly.  Tenderness: There is no abdominal tenderness.  Lymphadenopathy:     Cervical: No cervical adenopathy.  Skin:    General: Skin is warm.     Findings: Rash present. No petechiae. Rash is macular. Rash is not crusting, nodular, papular, purpuric, pustular, scaling, urticarial or vesicular.     Comments: There are THTC lesions.  On the upper back symmetrically there are hyperpigmented macules.  On her left upper extremity and both lower extremities there are hyperpigmented macules-some macules have been excoriated and are pale and scarred.  See photo.  The pale scarred lesion on  her left upper arm was biopsied.  Neurological:     General: No focal deficit present.     Mental Status: She is alert.  Psychiatric:        Mood and Affect: Mood normal.        Behavior: Behavior normal.     Lab Results  Component Value Date   WBC 3.7 (L) 07/05/2018   HGB 13.3 07/05/2018   HCT 41.8 07/05/2018   PLT 227.0 07/05/2018   GLUCOSE 102 (H) 06/21/2019   CHOL 204 (H) 07/05/2018   TRIG 76.0 07/05/2018   HDL 48.00 07/05/2018   LDLCALC 141 (H) 07/05/2018   ALT 15 12/14/2016   AST 18 12/14/2016   NA 138 06/21/2019   K 3.6 06/21/2019   CL 99 06/21/2019   CREATININE 1.94 (H) 06/21/2019   BUN 27 (H) 06/21/2019   CO2 30 06/21/2019   TSH 1.20 07/05/2018    Mm 3d Screen Breast Bilateral          Result Date: 01/02/2019 CLINICAL DATA:  Screening. EXAM: DIGITAL SCREENING BILATERAL MAMMOGRAM WITH TOMO AND CAD COMPARISON:  Previous exam(s). ACR Breast Density Category a: The breast tissue is almost entirely fatty. FINDINGS: There are no findings suspicious for malignancy. Images were processed with CAD. IMPRESSION: No mammographic evidence of malignancy. A result letter of this screening mammogram will be mailed directly to the patient. RECOMMENDATION: Screening mammogram in one year. (Code:[id number]-B-01Y) BI-RADS CATEGORY  1: Negative. Electronically Signed   By: Curlene Dolphin M.D.   On: 01/02/2019 11:15    After informed verbal consent was obtained, using Betadine for cleansing and 1% Lidocaine with epinephrine for anesthetic, with sterile technique a 4 mm punch biopsy was used to obtain a biopsy specimen of the lesion. Hemostasis was obtained by pressure and wound was closed with 1 suture. Antibiotic dressing is applied, and wound care instructions provided. The specimen is labeled and sent to pathology for evaluation. The procedure was well tolerated without complications.   Assessment & Plan:   [name] was seen today for rash.  Diagnoses and all orders for this  visit:  Need for influenza vaccination -     Flu Vaccine QUAD 36+ mos IM  Rash and other nonspecific skin eruption- Biopsy specimen sent to confirm the diagnosis of eczema and to rule out tinea, to rule out a malignant process, to rule out vasculitis, to look for signs of drug allergy. -     Dermatology pathology; Future  Intrinsic eczema -     Discontinue: fluocinonide-emollient (LIDEX-E) 0.05 % cream; Apply 1 application topically 2 (two) times daily. -     methylPREDNISolone (MEDROL DOSEPAK) 4 MG TBPK tablet; TAKE AS DIRECTED -     levocetirizine (XYZAL) 5 MG tablet; Take 1 tablet (5 mg total) by mouth every evening. -     triamcinolone cream (KENALOG) 0.5 %; Apply 1 application topically 3 (three) times daily.  I have discontinued Kismet P. Zaino's fluocinonide-emollient. I am also having her start on methylPREDNISolone, levocetirizine, and triamcinolone cream. Additionally, I am having her maintain her aluminum chloride, albuterol, mometasone-formoterol, traMADol, and triamterene-hydrochlorothiazide.  Meds ordered this encounter  Medications   DISCONTD: fluocinonide-emollient (LIDEX-E) 0.05 % cream    Sig: Apply 1 application topically 2 (two) times daily.    Dispense:  60 g    Refill:  2   methylPREDNISolone (MEDROL DOSEPAK) 4 MG TBPK tablet    Sig: TAKE AS DIRECTED    Dispense:  21 tablet    Refill:  0   levocetirizine (XYZAL) 5 MG tablet    Sig: Take 1 tablet (5 mg total) by mouth every evening.    Dispense:  90 tablet    Refill:  1   triamcinolone cream (KENALOG) 0.5 %    Sig: Apply 1 application topically 3 (three) times daily.    Dispense:  454 g    Refill:  1     Follow-up: Return in about 1 week (around 07/11/2019).  [name], MD

## 2019-07-04 NOTE — Telephone Encounter (Signed)
Pt states that the Lidex cream is too expensive.  Is there an alternative?

## 2019-07-04 NOTE — Patient Instructions (Signed)
Skin Biopsy, Care After This sheet gives you information about how to care for yourself after your procedure. Your health care provider may also give you more specific instructions. If you have problems or questions, contact your health care provider. What can I expect after the procedure? After the procedure, it is common to have:  Soreness.  Bruising.  Itching. Follow these instructions at home: Biopsy site care Follow instructions from your health care provider about how to take care of your biopsy site. Make sure you:  Wash your hands with soap and water before and after you change your bandage (dressing). If soap and water are not available, use hand sanitizer.  Apply ointment on your biopsy site as directed by your health care provider.  Change your dressing as told by your health care provider.  Leave stitches (sutures), skin glue, or adhesive strips in place. These skin closures may need to stay in place for 2 weeks or longer. If adhesive strip edges start to loosen and curl up, you may trim the loose edges. Do not remove adhesive strips completely unless your health care provider tells you to do that.  If the biopsy area bleeds, apply gentle pressure for 10 minutes. Check your biopsy site every day for signs of infection. Check for:  Redness, swelling, or pain.  Fluid or blood.  Warmth.  Pus or a bad smell.  General instructions  Rest and then return to your normal activities as told by your health care provider.  Take over-the-counter and prescription medicines only as told by your health care provider.  Keep all follow-up visits as told by your health care provider. This is important. Contact a health care provider if:  You have redness, swelling, or pain around your biopsy site.  You have fluid or blood coming from your biopsy site.  Your biopsy site feels warm to the touch.  You have pus or a bad smell coming from your biopsy site.  You have a fever.   Your sutures, skin glue, or adhesive strips loosen or come off sooner than expected. Get help right away if:  You have bleeding that does not stop with pressure or a dressing. Summary  After the procedure, it is common to have soreness, bruising, and itching at the site.  Follow instructions from your health care provider about how to take care of your biopsy site.  Check your biopsy site every day for signs of infection.  Contact a health care provider if you have redness, swelling, or pain around your biopsy site, or your biopsy site feels warm to the touch.  Keep all follow-up visits as told by your health care provider. This is important. This information is not intended to replace advice given to you by your health care provider. Make sure you discuss any questions you have with your health care provider. Document Released: 11/21/2015 Document Revised: 04/24/2018 Document Reviewed: 04/24/2018 Elsevier Patient Education  2020 [employer]  

## 2019-07-08 ENCOUNTER — Encounter: Payer: Self-pay | Admitting: Internal Medicine

## 2019-07-11 ENCOUNTER — Ambulatory Visit (INDEPENDENT_AMBULATORY_CARE_PROVIDER_SITE_OTHER): Payer: Medicare Other | Admitting: Internal Medicine

## 2019-07-11 ENCOUNTER — Other Ambulatory Visit (INDEPENDENT_AMBULATORY_CARE_PROVIDER_SITE_OTHER): Payer: Medicare Other

## 2019-07-11 ENCOUNTER — Other Ambulatory Visit: Payer: Self-pay

## 2019-07-11 ENCOUNTER — Encounter: Payer: Self-pay | Admitting: Internal Medicine

## 2019-07-11 VITALS — BP 132/80 | HR 72 | Temp 98.3°F | Ht 61.0 in | Wt 220.0 lb

## 2019-07-11 DIAGNOSIS — L2084 Intrinsic (allergic) eczema: Secondary | ICD-10-CM

## 2019-07-11 DIAGNOSIS — K2100 Gastro-esophageal reflux disease with esophagitis, without bleeding: Secondary | ICD-10-CM

## 2019-07-11 DIAGNOSIS — L239 Allergic contact dermatitis, unspecified cause: Secondary | ICD-10-CM

## 2019-07-11 DIAGNOSIS — E785 Hyperlipidemia, unspecified: Secondary | ICD-10-CM | POA: Diagnosis not present

## 2019-07-11 DIAGNOSIS — I10 Essential (primary) hypertension: Secondary | ICD-10-CM

## 2019-07-11 DIAGNOSIS — E669 Obesity, unspecified: Secondary | ICD-10-CM | POA: Insufficient documentation

## 2019-07-11 DIAGNOSIS — E118 Type 2 diabetes mellitus with unspecified complications: Secondary | ICD-10-CM

## 2019-07-11 DIAGNOSIS — Z0000 Encounter for general adult medical examination without abnormal findings: Secondary | ICD-10-CM | POA: Diagnosis not present

## 2019-07-11 DIAGNOSIS — R739 Hyperglycemia, unspecified: Secondary | ICD-10-CM | POA: Diagnosis not present

## 2019-07-11 DIAGNOSIS — G8929 Other chronic pain: Secondary | ICD-10-CM

## 2019-07-11 DIAGNOSIS — M5441 Lumbago with sciatica, right side: Secondary | ICD-10-CM

## 2019-07-11 LAB — CBC WITH DIFFERENTIAL/PLATELET
Basophils Absolute: 0.1 10*3/uL (ref 0.0–0.1)
Basophils Relative: 1.1 % (ref 0.0–3.0)
Eosinophils Absolute: 0.2 10*3/uL (ref 0.0–0.7)
Eosinophils Relative: 3.3 % (ref 0.0–5.0)
HCT: 41.6 % (ref 36.0–46.0)
Hemoglobin: 13.3 g/dL (ref 12.0–15.0)
Lymphocytes Relative: 42.9 % (ref 12.0–46.0)
Lymphs Abs: 2.8 10*3/uL (ref 0.7–4.0)
MCHC: 32.1 g/dL (ref 30.0–36.0)
MCV: 81.4 fl (ref 78.0–100.0)
Monocytes Absolute: 0.5 10*3/uL (ref 0.1–1.0)
Monocytes Relative: 7.7 % (ref 3.0–12.0)
Neutro Abs: 3 10*3/uL (ref 1.4–7.7)
Neutrophils Relative %: 45 % (ref 43.0–77.0)
Platelets: 258 10*3/uL (ref 150.0–400.0)
RBC: 5.1 Mil/uL (ref 3.87–5.11)
RDW: 15.8 % — ABNORMAL HIGH (ref 11.5–15.5)
WBC: 6.6 10*3/uL (ref 4.0–10.5)

## 2019-07-11 LAB — SEDIMENTATION RATE: Sed Rate: 68 mm/hr — ABNORMAL HIGH (ref 0–30)

## 2019-07-11 LAB — LIPID PANEL
Cholesterol: 197 mg/dL (ref 0–200)
HDL: 50.9 mg/dL (ref >39.00)
LDL Cholesterol: 124 mg/dL — ABNORMAL HIGH (ref 0–99)
NonHDL: 146.5
Total CHOL/HDL Ratio: 4
Triglycerides: 113 mg/dL (ref 0.0–149.0)
VLDL: 22.6 mg/dL (ref 0.0–40.0)

## 2019-07-11 LAB — TSH: TSH: 2.8 u[IU]/mL (ref 0.35–4.50)

## 2019-07-11 LAB — HEMOGLOBIN A1C: Hgb A1c MFr Bld: 6.5 % (ref 4.6–6.5)

## 2019-07-11 MED ORDER — ROSUVASTATIN CALCIUM 20 MG PO TABS: 20.0000 mg | ORAL_TABLET | Freq: Every day | ORAL | 1 refills | Status: DC

## 2019-07-11 MED ORDER — TRIAMCINOLONE ACETONIDE 0.5 % EX CREA: 1.0000 "application " | TOPICAL_CREAM | Freq: Three times a day (TID) | CUTANEOUS | 1 refills | Status: DC

## 2019-07-11 MED ORDER — HYDROCODONE-ACETAMINOPHEN 5-325 MG PO TABS: 1.0000 | ORAL_TABLET | Freq: Four times a day (QID) | ORAL | 0 refills | Status: DC | PRN

## 2019-07-11 NOTE — Patient Instructions (Signed)
Health Maintenance, Female Adopting a healthy lifestyle and getting preventive care are important in promoting health and wellness. Ask your health care provider about:  The right schedule for you to have regular tests and exams.  Things you can do on your own to prevent diseases and keep yourself healthy. What should I know about diet, weight, and exercise? Eat a healthy diet   Eat a diet that includes plenty of vegetables, fruits, low-fat dairy products, and lean protein.  Do not eat a lot of foods that are high in solid fats, added sugars, or sodium. Maintain a healthy weight Body mass index (BMI) is used to identify weight problems. It estimates body fat based on height and weight. Your health care provider can help determine your BMI and help you achieve or maintain a healthy weight. Get regular exercise Get regular exercise. This is one of the most important things you can do for your health. Most adults should:  Exercise for at least 150 minutes each week. The exercise should increase your heart rate and make you sweat (moderate-intensity exercise).  Do strengthening exercises at least twice a week. This is in addition to the moderate-intensity exercise.  Spend less time sitting. Even light physical activity can be beneficial. Watch cholesterol and blood lipids Have your blood tested for lipids and cholesterol at 58 years of age, then have this test every 5 years. Have your cholesterol levels checked more often if:  Your lipid or cholesterol levels are high.  You are older than 58 years of age.  You are at high risk for heart disease. What should I know about cancer screening? Depending on your health history and family history, you may need to have cancer screening at various ages. This may include screening for:  Breast cancer.  Cervical cancer.  Colorectal cancer.  Skin cancer.  Lung cancer. What should I know about heart disease, diabetes, and high blood  pressure? Blood pressure and heart disease  High blood pressure causes heart disease and increases the risk of stroke. This is more likely to develop in people who have high blood pressure readings, are of African descent, or are overweight.  Have your blood pressure checked: ? Every 3-5 years if you are 18-39 years of age. ? Every year if you are 40 years old or older. Diabetes Have regular diabetes screenings. This checks your fasting blood sugar level. Have the screening done:  Once every three years after age 40 if you are at a normal weight and have a low risk for diabetes.  More often and at a younger age if you are overweight or have a high risk for diabetes. What should I know about preventing infection? Hepatitis B If you have a higher risk for hepatitis B, you should be screened for this virus. Talk with your health care provider to find out if you are at risk for hepatitis B infection. Hepatitis C Testing is recommended for:  Everyone born from 1945 through 1965.  Anyone with known risk factors for hepatitis C. Sexually transmitted infections (STIs)  Get screened for STIs, including gonorrhea and chlamydia, if: ? You are sexually active and are younger than 58 years of age. ? You are older than 58 years of age and your health care provider tells you that you are at risk for this type of infection. ? Your sexual activity has changed since you were last screened, and you are at increased risk for chlamydia or gonorrhea. Ask your health care provider if   you are at risk.  Ask your health care provider about whether you are at high risk for HIV. Your health care provider may recommend a prescription medicine to help prevent HIV infection. If you choose to take medicine to prevent HIV, you should first get tested for HIV. You should then be tested every 3 months for as long as you are taking the medicine. Pregnancy  If you are about to stop having your period (premenopausal) and  you may become pregnant, seek counseling before you get pregnant.  Take 400 to 800 micrograms (mcg) of folic acid every day if you become pregnant.  Ask for birth control (contraception) if you want to prevent pregnancy. Osteoporosis and menopause Osteoporosis is a disease in which the bones lose minerals and strength with aging. This can result in bone fractures. If you are 65 years old or older, or if you are at risk for osteoporosis and fractures, ask your health care provider if you should:  Be screened for bone loss.  Take a calcium or vitamin D supplement to lower your risk of fractures.  Be given hormone replacement therapy (HRT) to treat symptoms of menopause. Follow these instructions at home: Lifestyle  Do not use any products that contain nicotine or tobacco, such as cigarettes, e-cigarettes, and chewing tobacco. If you need help quitting, ask your health care provider.  Do not use street drugs.  Do not share needles.  Ask your health care provider for help if you need support or information about quitting drugs. Alcohol use  Do not drink alcohol if: ? Your health care provider tells you not to drink. ? You are pregnant, may be pregnant, or are planning to become pregnant.  If you drink alcohol: ? Limit how much you use to 0-1 drink a day. ? Limit intake if you are breastfeeding.  Be aware of how much alcohol is in your drink. In the [place] one drink equals one 12 oz bottle of beer (355 mL), one 5 oz glass of wine (148 mL), or one 1 oz glass of hard liquor (44 mL). General instructions  Schedule regular health, dental, and eye exams.  Stay current with your vaccines.  Tell your health care provider if: ? You often feel depressed. ? You have ever been abused or do not feel safe at home. Summary  Adopting a healthy lifestyle and getting preventive care are important in promoting health and wellness.  Follow your health care provider's instructions about healthy  diet, exercising, and getting tested or screened for diseases.  Follow your health care provider's instructions on monitoring your cholesterol and blood pressure. This information is not intended to replace advice given to you by your health care provider. Make sure you discuss any questions you have with your health care provider. Document Released: 05/10/2011 Document Revised: 10/18/2018 Document Reviewed: 10/18/2018 Elsevier Patient Education  2020 [employer]  

## 2019-07-11 NOTE — Progress Notes (Signed)
Subjective:  Patient ID: [name], female    DOB: August 16, 1961  Age: 58 y.o. MRN: [id number]  CC: Annual Exam, Hypertension, Rash, Hyperlipidemia, and Diabetes   HPI [name] presents for a CPX.  She tells me the rash is somewhat better but not much.  She is not using the topical steroid because she says it was too expensive.  She tells me the oral steroids helped some.  The biopsy site is healing nicely with no complications.  Outpatient Medications Prior to Visit  Medication Sig Dispense Refill  . albuterol (PROVENTIL HFA;VENTOLIN HFA) 108 (90 Base) MCG/ACT inhaler Inhale 2 puffs into the lungs every 4 (four) hours as needed for wheezing or shortness of breath (cough, shortness of breath or wheezing.). 1 Inhaler 1  . aluminum chloride (DRYSOL) 20 % external solution Apply topically at bedtime. 60 mL 3  . levocetirizine (XYZAL) 5 MG tablet Take 1 tablet (5 mg total) by mouth every evening. 90 tablet 1  . mometasone-formoterol (DULERA) 100-5 MCG/ACT AERO Inhale 2 puffs into the lungs 2 (two) times daily. 13 g 11  . triamterene-hydrochlorothiazide (MAXZIDE) 75-50 MG tablet Take 1 tablet by mouth daily. 90 tablet 0  . traMADol (ULTRAM) 50 MG tablet Take 1 tablet (50 mg total) by mouth every 6 (six) hours as needed. for pain 100 tablet 3  . triamcinolone cream (KENALOG) 0.5 % Apply 1 application topically 3 (three) times daily. (Patient not taking: Reported on 07/11/2019) 454 g 1   No facility-administered medications prior to visit.     ROS Review of Systems  Constitutional: Negative.  Negative for diaphoresis, fatigue and unexpected weight change.  HENT: Negative.   Eyes: Negative for visual disturbance.  Respiratory: Negative.  Negative for cough, chest tightness, shortness of breath and wheezing.   Cardiovascular: Negative for chest pain, palpitations and leg swelling.  Gastrointestinal: Negative for abdominal pain, constipation, diarrhea, nausea and vomiting.   Endocrine: Negative.  Negative for polydipsia, polyphagia and polyuria.  Genitourinary: Negative.  Negative for difficulty urinating.  Musculoskeletal: Positive for back pain. Negative for arthralgias and myalgias.  Skin: Negative.   Allergic/Immunologic: Negative.   Neurological: Negative.   Hematological: Negative for adenopathy. Does not bruise/bleed easily.  Psychiatric/Behavioral: Negative.     Objective:  BP 132/80 (BP Location: Left Arm, Patient Position: Sitting, Cuff Size: Large)   Pulse 72   Temp 98.3 F (36.8 C) (Oral)   Ht 5\' 1"  (1.549 m)   Wt 220 lb (99.8 kg)   LMP 05/17/2012   SpO2 96%   BMI 41.57 kg/m   BP Readings from Last 3 Encounters:  07/11/19 132/80  07/04/19 120/80  06/21/19 112/88    Wt Readings from Last 3 Encounters:  07/11/19 220 lb (99.8 kg)  07/04/19 222 lb (100.7 kg)  06/21/19 219 lb (99.3 kg)    Physical Exam Vitals signs reviewed.  Constitutional:      General: She is not in acute distress.    Appearance: She is obese. She is not ill-appearing, toxic-appearing or diaphoretic.  HENT:     Nose: Nose normal.     Mouth/Throat:     Mouth: Mucous membranes are moist.  Eyes:     General: No scleral icterus.    Conjunctiva/sclera: Conjunctivae normal.  Neck:     Musculoskeletal: Normal range of motion and neck supple. No neck rigidity or muscular tenderness.  Cardiovascular:     Rate and Rhythm: Normal rate and regular rhythm.  Heart sounds: No murmur. No gallop.   Pulmonary:     Effort: Pulmonary effort is normal.     Breath sounds: No stridor. No wheezing, rhonchi or rales.  Abdominal:     General: Abdomen is protuberant. Bowel sounds are normal. There is no distension.     Palpations: Abdomen is soft. There is no hepatomegaly or splenomegaly.     Tenderness: There is no abdominal tenderness.     Hernia: No hernia is present.  Musculoskeletal: Normal range of motion.        General: No deformity or signs of injury.   Lymphadenopathy:     Cervical: No cervical adenopathy.  Skin:    General: Skin is warm.     Coloration: Skin is not pale.     Findings: Lesion and rash present.     Comments: Sutures removed from the biopsy site over the left upper arm.  See photo.  The area has healed nicely with no swelling, exudate, erythema, or streaking.  Skin findings are unchanged.  There are persistent hyperpigmented macules.  There are other macules that have been excoriated and are scarred.  Neurological:     General: No focal deficit present.     Mental Status: She is alert.  Psychiatric:        Mood and Affect: Mood normal.        Behavior: Behavior normal.     Lab Results  Component Value Date   WBC 6.6 07/11/2019   HGB 13.3 07/11/2019   HCT 41.6 07/11/2019   PLT 258.0 07/11/2019   GLUCOSE 102 (H) 06/21/2019   CHOL 197 07/11/2019   TRIG 113.0 07/11/2019   HDL 50.90 07/11/2019   LDLCALC 124 (H) 07/11/2019   ALT 15 12/14/2016   AST 18 12/14/2016   NA 138 06/21/2019   K 3.6 06/21/2019   CL 99 06/21/2019   CREATININE 1.94 (H) 06/21/2019   BUN 27 (H) 06/21/2019   CO2 30 06/21/2019   TSH 2.80 07/11/2019   HGBA1C 6.5 07/11/2019    Mm 3d Screen Breast Bilateral  Result Date: 01/02/2019 CLINICAL DATA:  Screening. EXAM: DIGITAL SCREENING BILATERAL MAMMOGRAM WITH TOMO AND CAD COMPARISON:  Previous exam(s). ACR Breast Density Category a: The breast tissue is almost entirely fatty. FINDINGS: There are no findings suspicious for malignancy. Images were processed with CAD. IMPRESSION: No mammographic evidence of malignancy. A result letter of this screening mammogram will be mailed directly to the patient. RECOMMENDATION: Screening mammogram in one year. (Code:SM-B-01Y) BI-RADS CATEGORY  1: Negative. Electronically Signed   By: Curlene Dolphin M.D.   On: 01/02/2019 11:15    Assessment & Plan:   Lennan was seen today for annual exam, hypertension, rash, hyperlipidemia and diabetes.  Diagnoses and all  orders for this visit:  Hyperlipidemia with target LDL less than 130- She has an elevated ASCVD risk score so I have asked her to start taking a statin for CV risk reduction. -     Lipid panel; Future -     TSH; Future -     rosuvastatin (CRESTOR) 20 MG tablet; Take 1 tablet (20 mg total) by mouth daily.  Routine general medical examination at a health care facility- Exam completed, labs reviewed, vaccines reviewed and updated, colon cancer screening/mammogram/Pap smear are all up-to-date.  Gastroesophageal reflux disease with esophagitis- Her symptoms are adequately well controlled.  No complications noted. -     CBC with Differential/Platelet; Future  Essential hypertension- Her blood pressure is adequately well controlled. -  CBC with Differential/Platelet; Future  Hyperglycemia- Her A1c is up to 6.5%. 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  I think the first thing to eliminate is tramadol.  I have also asked her to start using the topical steroid as directed. She will also see allergy to try to identify what is precipitating this. -     Ambulatory referral to Allergy -     triamcinolone cream (KENALOG) 0.5 %; Apply 1 application topically 3 (three) times daily. -     Sedimentation rate; Future  Intrinsic eczema -     triamcinolone cream (KENALOG) 0.5 %; Apply 1 application topically 3 (three) times daily.  Chronic right-sided low back pain with right-sided sciatica- Will change tramadol to hydrocodone since she has developed a dermal hypersensitivity reaction. -     HYDROcodone-acetaminophen (NORCO/VICODIN) 5-325 MG tablet; Take 1 tablet by mouth every 6 (six) hours as needed for moderate pain or severe pain.  Type II diabetes mellitus with manifestations ([place])- Her A1c is at 6.5%.  She was encouraged to improve her lifestyle modifications.  Medical therapy is not indicated yet.   I  have discontinued Namira P. Jeangilles's traMADol. I am also having her start on HYDROcodone-acetaminophen and rosuvastatin. Additionally, I am having her maintain her aluminum chloride, albuterol, mometasone-formoterol, triamterene-hydrochlorothiazide, levocetirizine, and triamcinolone cream.  Meds ordered this encounter  Medications  . triamcinolone cream (KENALOG) 0.5 %    Sig: Apply 1 application topically 3 (three) times daily.    Dispense:  454 g    Refill:  1  . HYDROcodone-acetaminophen (NORCO/VICODIN) 5-325 MG tablet    Sig: Take 1 tablet by mouth every 6 (six) hours as needed for moderate pain or severe pain.    Dispense:  65 tablet    Refill:  0  . rosuvastatin (CRESTOR) 20 MG tablet    Sig: Take 1 tablet (20 mg total) by mouth daily.    Dispense:  90 tablet    Refill:  1     Follow-up: Return in about 3 months (around 10/10/2019).  [name], MD

## 2019-07-19 ENCOUNTER — Ambulatory Visit (INDEPENDENT_AMBULATORY_CARE_PROVIDER_SITE_OTHER): Payer: Medicare Other | Admitting: Internal Medicine

## 2019-07-19 ENCOUNTER — Other Ambulatory Visit: Payer: Self-pay

## 2019-07-19 ENCOUNTER — Encounter: Payer: Self-pay | Admitting: Internal Medicine

## 2019-07-19 VITALS — BP 120/82 | HR 71 | Temp 98.4°F | Resp 16 | Ht 61.0 in | Wt 221.0 lb

## 2019-07-19 DIAGNOSIS — Z23 Encounter for immunization: Secondary | ICD-10-CM

## 2019-07-19 DIAGNOSIS — E118 Type 2 diabetes mellitus with unspecified complications: Secondary | ICD-10-CM | POA: Diagnosis not present

## 2019-07-19 DIAGNOSIS — S61215A Laceration without foreign body of left ring finger without damage to nail, initial encounter: Secondary | ICD-10-CM

## 2019-07-19 MED ORDER — CONTOUR NEXT EZ W/DEVICE KIT: 1.0000 | PACK | Freq: Two times a day (BID) | 0 refills | Status: DC

## 2019-07-19 NOTE — Patient Instructions (Signed)
Type 2 Diabetes Mellitus, Diagnosis, Adult Type 2 diabetes (type 2 diabetes mellitus) is a long-term (chronic) disease. In type 2 diabetes, one or both of these problems may be present:  The pancreas does not make enough of a hormone called insulin.  Cells in the body do not respond properly to insulin that the body makes (insulin resistance). Normally, insulin allows blood sugar (glucose) to enter cells in the body. The cells use glucose for energy. Insulin resistance or lack of insulin causes excess glucose to build up in the blood instead of going into cells. As a result, high blood glucose (hyperglycemia) develops. What increases the risk? The following factors may make you more likely to develop type 2 diabetes:  Having a family member with type 2 diabetes.  Being overweight or obese.  Having an inactive (sedentary) lifestyle.  Having been diagnosed with insulin resistance.  Having a history of prediabetes, gestational diabetes, or polycystic ovary syndrome (PCOS).  Being of American-Indian, African-American, Hispanic/Latino, or Asian/Pacific Islander descent. What are the signs or symptoms? In the early stage of this condition, you may not have symptoms. Symptoms develop slowly and may include:  Increased thirst (polydipsia).  Increased hunger(polyphagia).  Increased urination (polyuria).  Increased urination during the night (nocturia).  Unexplained weight loss.  Frequent infections that keep coming back (recurring).  Fatigue.  Weakness.  Vision changes, such as blurry vision.  Cuts or bruises that are slow to heal.  Tingling or numbness in the hands or feet.  Dark patches on the skin (acanthosis nigricans). How is this diagnosed? This condition is diagnosed based on your symptoms, your medical history, a physical exam, and your blood glucose level. Your blood glucose may be checked with one or more of the following blood tests:  A fasting blood glucose (FBG)  test. You will not be allowed to eat (you will fast) for 8 hours or longer before a blood sample is taken.  A random blood glucose test. This test checks blood glucose at any time of day regardless of when you ate.  An A1c (hemoglobin A1c) blood test. This test provides information about blood glucose control over the previous 2-3 months.  An oral glucose tolerance test (OGTT). This test measures your blood glucose at two times: ? After fasting. This is your baseline blood glucose level. ? Two hours after drinking a beverage that contains glucose. You may be diagnosed with type 2 diabetes if:  Your FBG level is 126 mg/dL (7.0 mmol/L) or higher.  Your random blood glucose level is 200 mg/dL (11.1 mmol/L) or higher.  Your A1c level is 6.5% or higher.  Your OGTT result is higher than 200 mg/dL (11.1 mmol/L). These blood tests may be repeated to confirm your diagnosis. How is this treated? Your treatment may be managed by a specialist called an endocrinologist. Type 2 diabetes may be treated by following instructions from your health care provider about:  Making diet and lifestyle changes. This may include: ? Following an individualized nutrition plan that is developed by a diet and nutrition specialist (registered dietitian). ? Exercising regularly. ? Finding ways to manage stress.  Checking your blood glucose level as often as told.  Taking diabetes medicines or insulin daily. This helps to keep your blood glucose levels in the healthy range. ? If you use insulin, you may need to adjust the dosage depending on how physically active you are and what foods you eat. Your health care provider will tell you how to adjust your dosage.    Taking medicines to help prevent complications from diabetes, such as: ? Aspirin. ? Medicine to lower cholesterol. ? Medicine to control blood pressure. Your health care provider will set individualized treatment goals for you. Your goals will be based on  your age, other medical conditions you have, and how you respond to diabetes treatment. Generally, the goal of treatment is to maintain the following blood glucose levels:  Before meals (preprandial): 80-130 mg/dL (4.4-7.2 mmol/L).  After meals (postprandial): below 180 mg/dL (10 mmol/L).  A1c level: less than 7%. Follow these instructions at home: Questions to ask your health care provider  Consider asking the following questions: ? Do I need to meet with a diabetes educator? ? Where can I find a support group for people with diabetes? ? What equipment will I need to manage my diabetes at home? ? What diabetes medicines do I need, and when should I take them? ? How often do I need to check my blood glucose? ? What number can I call if I have questions? ? When is my next appointment? General instructions  Take over-the-counter and prescription medicines only as told by your health care provider.  Keep all follow-up visits as told by your health care provider. This is important.  For more information about diabetes, visit: ? American Diabetes Association (ADA): www.diabetes.org ? American Association of Diabetes Educators (AADE): www.diabeteseducator.org Contact a health care provider if:  Your blood glucose is at or above 240 mg/dL (13.3 mmol/L) for 2 days in a row.  You have been sick or have had a fever for 2 days or longer, and you are not getting better.  You have any of the following problems for more than 6 hours: ? You cannot eat or drink. ? You have nausea and vomiting. ? You have diarrhea. Get help right away if:  Your blood glucose is lower than 54 mg/dL (3.0 mmol/L).  You become confused or you have trouble thinking clearly.  You have difficulty breathing.  You have moderate or large ketone levels in your urine. Summary  Type 2 diabetes (type 2 diabetes mellitus) is a long-term (chronic) disease. In type 2 diabetes, the pancreas does not make enough of a  hormone called insulin, or cells in the body do not respond properly to insulin that the body makes (insulin resistance).  This condition is treated by making diet and lifestyle changes and taking diabetes medicines or insulin.  Your health care provider will set individualized treatment goals for you. Your goals will be based on your age, other medical conditions you have, and how you respond to diabetes treatment.  Keep all follow-up visits as told by your health care provider. This is important. This information is not intended to replace advice given to you by your health care provider. Make sure you discuss any questions you have with your health care provider. Document Released: 10/25/2005 Document Revised: 12/23/2017 Document Reviewed: 11/28/2015 Elsevier Patient Education  2020 Elsevier Inc.  

## 2019-07-19 NOTE — Progress Notes (Signed)
 Subjective:  Patient ID: [name], female    DOB: 04/18/1961  Age: 58 y.o. MRN: [id number]  CC: Diabetes, Hypertension, and Wound Check   HPI [name] presents for f/up - She cut her left ring finger on a piece of glass 2 days ago.  She comes in to have it evaluated.  There was some bleeding at first but the bleeding ceased.  She has not noticed any pain, redness, swelling, numbness, weakness, or tingling in the finger.  Outpatient Medications Prior to Visit  Medication Sig Dispense Refill  . albuterol (PROVENTIL HFA;VENTOLIN HFA) 108 (90 Base) MCG/ACT inhaler Inhale 2 puffs into the lungs every 4 (four) hours as needed for wheezing or shortness of breath (cough, shortness of breath or wheezing.). 1 Inhaler 1  . aluminum chloride (DRYSOL) 20 % external solution Apply topically at bedtime. 60 mL 3  . HYDROcodone-acetaminophen (NORCO/VICODIN) 5-325 MG tablet Take 1 tablet by mouth every 6 (six) hours as needed for moderate pain or severe pain. 65 tablet 0  . levocetirizine (XYZAL) 5 MG tablet Take 1 tablet (5 mg total) by mouth every evening. 90 tablet 1  . mometasone-formoterol (DULERA) 100-5 MCG/ACT AERO Inhale 2 puffs into the lungs 2 (two) times daily. 13 g 11  . rosuvastatin (CRESTOR) 20 MG tablet Take 1 tablet (20 mg total) by mouth daily. 90 tablet 1  . triamcinolone cream (KENALOG) 0.5 % Apply 1 application topically 3 (three) times daily. 454 g 1  . triamterene-hydrochlorothiazide (MAXZIDE) 75-50 MG tablet Take 1 tablet by mouth daily. 90 tablet 0   No facility-administered medications prior to visit.     ROS Review of Systems  Constitutional: Negative.  Negative for chills, fatigue and fever.  HENT: Negative.   Eyes: Negative for visual disturbance.  Respiratory: Negative for cough, chest tightness, shortness of breath and wheezing.   Cardiovascular: Negative for chest pain, palpitations and leg swelling.  Gastrointestinal: Negative for abdominal pain,  constipation, diarrhea, nausea and vomiting.  Endocrine: Negative.  Negative for polydipsia, polyphagia and polyuria.  Genitourinary: Negative.  Negative for difficulty urinating.  Musculoskeletal: Negative.  Negative for arthralgias, back pain and myalgias.  Skin: Positive for wound.  Neurological: Negative.  Negative for dizziness and weakness.  Hematological: Negative for adenopathy. Does not bruise/bleed easily.  Psychiatric/Behavioral: Negative.     Objective:  BP 120/82 (BP Location: Left Arm, Patient Position: Sitting, Cuff Size: Large)   Pulse 71   Temp 98.4 F (36.9 C) (Oral)   Resp 16   Ht 5' 1" (1.549 m)   Wt 221 lb (100.2 kg)   LMP 05/17/2012   SpO2 97%   BMI 41.76 kg/m   BP Readings from Last 3 Encounters:  07/19/19 120/82  07/11/19 132/80  07/04/19 120/80    Wt Readings from Last 3 Encounters:  07/19/19 221 lb (100.2 kg)  07/11/19 220 lb (99.8 kg)  07/04/19 222 lb (100.7 kg)    Physical Exam Vitals signs reviewed.  Constitutional:      Appearance: She is obese.  HENT:     Nose: Nose normal.     Mouth/Throat:     Mouth: Mucous membranes are moist.  Eyes:     General: No scleral icterus.    Conjunctiva/sclera: Conjunctivae normal.  Neck:     Musculoskeletal: Normal range of motion. No neck rigidity or muscular tenderness.  Cardiovascular:     Rate and Rhythm: Normal rate and regular rhythm.     Heart sounds: No murmur.    Pulmonary:     Effort: Pulmonary effort is normal.     Breath sounds: No stridor. No wheezing, rhonchi or rales.  Abdominal:     General: Abdomen is protuberant. There is no distension.     Palpations: There is no hepatomegaly or splenomegaly.     Tenderness: There is no abdominal tenderness.  Musculoskeletal: Normal range of motion.     Left hand: She exhibits laceration. She exhibits normal range of motion, no tenderness, no bony tenderness, normal two-point discrimination, normal capillary refill, no deformity and no swelling.  Normal sensation noted. Normal strength noted.     Right lower leg: No edema.     Left lower leg: No edema.     Comments: Volar side left ring finger distal phalanx in the middle there is an uncomplicated healing laceration.  There is no tenderness or foreign body.  Distally there is good sensation and capillary refill.  Flexion extension is normal in the DIP joint.  See photo.  Lymphadenopathy:     Cervical: No cervical adenopathy.  Skin:    General: Skin is warm and dry.  Neurological:     General: No focal deficit present.     Mental Status: She is alert.  Psychiatric:        Mood and Affect: Mood normal.        Behavior: Behavior normal.     Lab Results  Component Value Date   WBC 6.6 07/11/2019   HGB 13.3 07/11/2019   HCT 41.6 07/11/2019   PLT 258.0 07/11/2019   GLUCOSE 102 (H) 06/21/2019   CHOL 197 07/11/2019   TRIG 113.0 07/11/2019   HDL 50.90 07/11/2019   LDLCALC 124 (H) 07/11/2019   ALT 15 12/14/2016   AST 18 12/14/2016   NA 138 06/21/2019   K 3.6 06/21/2019   CL 99 06/21/2019   CREATININE 1.94 (H) 06/21/2019   BUN 27 (H) 06/21/2019   CO2 30 06/21/2019   TSH 2.80 07/11/2019   HGBA1C 6.5 07/11/2019    Mm 3d Screen Breast Bilateral  Result Date: 01/02/2019 CLINICAL DATA:  Screening. EXAM: DIGITAL SCREENING BILATERAL   MAMMOGRAM WITH TOMO AND CAD COMPARISON:  Previous exam(s). ACR Breast Density Category a: The breast tissue is almost entirely fatty. FINDINGS: There are no findings suspicious for malignancy. Images were processed with CAD. IMPRESSION: No mammographic evidence of malignancy. A result letter of this screening mammogram will be mailed directly to the patient. RECOMMENDATION: Screening mammogram in one year. (Code:SM-B-01Y) BI-RADS CATEGORY  1: Negative. Electronically Signed   By: Curlene Dolphin M.D.   On: 01/02/2019 11:15    Assessment & Plan:   [name] was seen today for diabetes, hypertension and wound check.  Diagnoses and all orders for this  visit:  Type II diabetes mellitus with manifestations ([place])- Her recent A1c was 6.5%.  Medical therapy is not indicated. -     Ambulatory referral to Ophthalmology -     Blood Glucose Monitoring Suppl (CONTOUR NEXT EZ) w/Device KIT; 1 Act by Does not apply route 2 (two) times daily. -     HM Diabetes Foot Exam -     Amb Referral to Nutrition and Diabetic E  Laceration of left ring finger without foreign body without damage to nail, initial encounter- I reassured her that there are no complications related to this.  She will continue to apply local wound care to the area.   I am having Carey P. Loudermilk start on [place]. I am  also having her maintain her aluminum chloride, albuterol, mometasone-formoterol, triamterene-hydrochlorothiazide, levocetirizine, triamcinolone cream, HYDROcodone-acetaminophen, and rosuvastatin.  Meds ordered this encounter  Medications  . Blood Glucose Monitoring Suppl (CONTOUR NEXT EZ) w/Device KIT    Sig: 1 Act by Does not apply route 2 (two) times daily.    Dispense:  2 kit    Refill:  0     Follow-up: Return in about 4 months (around 11/18/2019).  Thomas Jones, MD 

## 2019-07-25 DIAGNOSIS — N183 Chronic kidney disease, stage 3 (moderate): Secondary | ICD-10-CM | POA: Diagnosis not present

## 2019-07-27 ENCOUNTER — Telehealth: Payer: Self-pay | Admitting: Internal Medicine

## 2019-07-27 NOTE — Telephone Encounter (Signed)
Pt stated Dr. Ronnald Ramp gave her a meter, lancets and test strips at her last appt and she has run out of the lancets and strips. She wants to know if Dr. Ronnald Ramp is going to send in rx for these.Please advise

## 2019-07-27 NOTE — Telephone Encounter (Signed)
Pt stated Dr. Ronnald Ramp gave her a meter, lancets and test strips at her last appt and she has run out of the lancets and strips. She wants to know if Dr. Ronnald Ramp is going to send in rx for these.Please advise.  [place] ([place]), [place] - [address] [phone] (Phone) [phone] (Fax)

## 2019-07-30 MED ORDER — BAYER MICROLET LANCETS MISC: 1.0000 | Freq: Two times a day (BID) | 12 refills | Status: DC

## 2019-07-30 MED ORDER — BLOOD GLUCOSE MONITOR KIT: PACK | 0 refills | Status: AC

## 2019-07-30 MED ORDER — COOL BLOOD GLUCOSE TEST STRIPS VI STRP: ORAL_STRIP | 3 refills | Status: DC

## 2019-07-30 MED ORDER — ACCU-CHEK SOFT TOUCH LANCETS MISC: 3 refills | Status: DC

## 2019-07-30 MED ORDER — CONTOUR NEXT TEST VI STRP: 1.0000 | ORAL_STRIP | Freq: Two times a day (BID) | 12 refills | Status: DC

## 2019-07-30 NOTE — Telephone Encounter (Signed)
Patient called [facility] 9/19 at 11:02am.  States that she needs refill on Contour Next test strips and lacents.  States is out of both.

## 2019-07-30 NOTE — Telephone Encounter (Signed)
Contacted pt and she stated that insurance covers [place] or [employer].

## 2019-07-30 NOTE — Telephone Encounter (Signed)
rx printed for pcp to sign.

## 2019-07-30 NOTE — Telephone Encounter (Signed)
Refills sent. See meds. Pt informed. 

## 2019-07-30 NOTE — Telephone Encounter (Signed)
Patient is calling to state that pharmacy said the the script sent in is not cover by her insurance. Are there any samples at the office? And long term, what does Dr. Ronnald Ramp want to do.  Please advise [phone]- [phone]

## 2019-07-30 NOTE — Addendum Note (Signed)
Addended by: Karle Barr on: 07/30/2019 03:48 PM   Modules accepted: Orders

## 2019-07-31 ENCOUNTER — Other Ambulatory Visit: Payer: Self-pay | Admitting: Internal Medicine

## 2019-07-31 DIAGNOSIS — E118 Type 2 diabetes mellitus with unspecified complications: Secondary | ICD-10-CM

## 2019-07-31 MED ORDER — ACCU-CHEK GUIDE VI STRP: 1.0000 | ORAL_STRIP | Freq: Two times a day (BID) | 1 refills | Status: DC

## 2019-07-31 NOTE — Telephone Encounter (Signed)
rx has been faxed 

## 2019-08-01 ENCOUNTER — Ambulatory Visit (INDEPENDENT_AMBULATORY_CARE_PROVIDER_SITE_OTHER): Payer: Medicare Other | Admitting: Allergy

## 2019-08-01 ENCOUNTER — Encounter: Payer: Self-pay | Admitting: Allergy

## 2019-08-01 ENCOUNTER — Other Ambulatory Visit: Payer: Self-pay

## 2019-08-01 VITALS — BP 118/78 | HR 85 | Temp 97.8°F | Resp 16 | Ht 61.0 in | Wt 221.6 lb

## 2019-08-01 DIAGNOSIS — K9049 Malabsorption due to intolerance, not elsewhere classified: Secondary | ICD-10-CM | POA: Diagnosis not present

## 2019-08-01 DIAGNOSIS — L309 Dermatitis, unspecified: Secondary | ICD-10-CM

## 2019-08-01 DIAGNOSIS — J4520 Mild intermittent asthma, uncomplicated: Secondary | ICD-10-CM

## 2019-08-01 DIAGNOSIS — J3089 Other allergic rhinitis: Secondary | ICD-10-CM | POA: Diagnosis not present

## 2019-08-01 DIAGNOSIS — R609 Edema, unspecified: Secondary | ICD-10-CM | POA: Diagnosis not present

## 2019-08-01 DIAGNOSIS — L2089 Other atopic dermatitis: Secondary | ICD-10-CM

## 2019-08-01 DIAGNOSIS — I129 Hypertensive chronic kidney disease with stage 1 through stage 4 chronic kidney disease, or unspecified chronic kidney disease: Secondary | ICD-10-CM | POA: Diagnosis not present

## 2019-08-01 DIAGNOSIS — N183 Chronic kidney disease, stage 3 (moderate): Secondary | ICD-10-CM | POA: Diagnosis not present

## 2019-08-01 DIAGNOSIS — H1013 Acute atopic conjunctivitis, bilateral: Secondary | ICD-10-CM

## 2019-08-01 DIAGNOSIS — R21 Rash and other nonspecific skin eruption: Secondary | ICD-10-CM | POA: Diagnosis not present

## 2019-08-01 NOTE — Patient Instructions (Addendum)
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  This indicates that some allergen is the trigger however this allergen could be an environmental allergen, a drug allergen, a skin contact allergen for examples.  - unclear if this rash is triggered by Tramadol.  You had been on this medication for 2.5 years without previous issue thus it does make it less likely a trigger of the rash.  However since stopping tramadol the rash has improved.  This too could be due to use of Xyzal and Triamcinolone working to treat the rash as well. There are no testing modalities to determine if Tramadol is the cause.   Would not recommend challenging to Tramadol at this time and would continue to avoid.   Consider challenging in the future as not concerned for serious drug eruptions  - environmental allergy skin testing done today is positive to grass pollens, tree pollens, weed pollens, dust mites.  Allergen avoidance measures discussed/handouts provided.  Do your best to avoid these allergens.   - skin testing to milk and casein were negative thus likely you are lactose intolerant.   - recommend performing patch testing.  Patch testing evaluates for contact dermatitis.  Patches are best placed on Mondays with return to office on Wednesday and Friday of same week for readings.  Once patches are in place on back they are not to get wet.  You can continue to take your Xyzal while patches are in place.  Below are the chemicals/compounds tested from in patch testing.   - if rash returns or worsens would recommend dermatology referral  - continue use of triamcinolone 1 thin application twice a day as needed for rash  - continue Xyzal 5mg  daily.  Asthma  - lung function testing looks good  - have access to albuterol inhaler 2 puffs every 4-6 hours as needed for cough/wheeze/shortness of breath/chest tightness.  May use 15-20 minutes prior to activity.   Monitor frequency of use.    - continue Dulera  133mcg 2 puffs twice a day  Asthma control goals:   Full participation in all desired activities (may need albuterol before activity)  Albuterol use two time or less a week on average (not counting use with activity)  Cough interfering with sleep two time or less a month  Oral steroids no more than once a year  No hospitalizations  Allergies   - environmental allergy testing as above it positive to pollens and dust mites  - continue Xyzal 5mg  daily as needed for allergy symptom control  - for nasal congestion/drainage can use over-the-counter Flonase, Rhinocort or Nasacort 2 sprays each nostril daily as needed  - for itchy/watery/red eyes can use over-the-counter Pataday 1 drop each eye daily as needed.   Eczema  - daily moisturization is key  - continue triamcinolone as above  Food intolerance  - milk testing is negative.  Thus likely have lactose intolerance causing GI upset with dairy ingestion.  Can try use of Lactaid milk or Lactaid pills prior to dairy ingestion to see if this prevents GI symptoms.         True Test looks for the following sensitivities:

## 2019-08-01 NOTE — Progress Notes (Signed)
New Patient Note  RE: [name] MRN: [id number] DOB: [birth year] Date of Office Visit: 08/01/2019  Referring provider: [name], MD Primary care provider: [name], MD  Chief Complaint: rash  History of present illness: [name] is a 58 y.o. female presenting today for consultation for rash.   She has had a rash over the past 3 months.   The rash is mostly on her arms, back and legs.  She states the rash started out on her arms but she thought it was her typical eczema.  She normally uses OTC cortisone10 on her eczema as she states topical steroids previously were still too expensive to get even wen her insurance coverage.  The cortisone10 wasn't really helping this rash.  She states she then started noticing swelling of her feet and legs and that the rash then started on her legs.  Some of the rash she reports looked like blisters with "white liquid".  She has several lesions that she reports were blisters that ruptured and are still healing.  She states as the rash heals its leaving dark marks.  Rash has been itchy.  No mucosal involvement.  No fevers.  Husband who lives in the home does not have similar symptoms.  She denies any recent travel prior to onset of the rash.  Denies any new foods or medications, bites/stings or change in detergents/soaps/lotions prior to onset of rash.   She states her PCP is concerned rash could be triggered by tramadol and thus had her stop.  She states she had been on tramadol for past 2.5 years without any issues.  The rest of her medications she states she has been on for years as well.   She was advised to use Xyzal and she was able to get triamcinolone which she has been using.  She states the rash is improved however not sure if it is becomes the Xyzal and steroid are effective or if it is due to stopping tramadol.  She also states she has used peroxide on the blisters to help from getting infected.  She has not required antibiotics  for secondary infection.  A biopsy was done by her PCP that was consistent with dermal hypersensitivity reaction (see report below).   She has not seen a dermatologist for current rash.  She states she has seen dermatology in past for rashes.    She has history of asthma diagnosed in childhood.  She is on [facility] and PCP manages. Triggers are humidity/being hot, colder/windy weather primarily. She reports albuterol use 1-2 times a week.   She is on [facility] for years 2 puffs twice a day.  No hospitalization.  Denies nighttime awakenings.    She does report having seasonal allergies but thought she had outgrew them however she states this year she has had issues with itchy eyes, nasal congestion/drainage and sneezing.    Avoids milk/dairy due to development of stomach pains and sometimes diarrhea; this has been ongoing for many years and not sure of exact onset.  She denies having any cutaneous, respiratory or GI symptoms with dairy ingestion.    Review of systems: Review of Systems  Constitutional: Negative for chills, fever and malaise/fatigue.  HENT: Negative for congestion, nosebleeds and sore throat.   Eyes: Negative for pain, discharge and redness.  Respiratory: Negative.   Cardiovascular: Negative.   Gastrointestinal: Negative.   Musculoskeletal: Positive for back pain.  Skin: Positive for itching and rash.  Neurological: Negative.  All other systems negative unless noted above in HPI ° °Past medical history: °Past Medical History:  °Diagnosis Date  °• Allergy   ° rhinitis  °• Anemia   °• Arthritis   °• Asthma   °• Headache(784.0)   °• History of stomach ulcers   °• Hypertension   °• Internal hemorrhoids   ° ° °Past surgical history: °Past Surgical History:  °Procedure Laterality Date  °• CARPAL TUNNEL RELEASE Right   °• TUBAL LIGATION    ° ° °Family history:  °Family History  °Problem Relation Age of Onset  °• Arthritis Other   °• Diabetes Other   °• Hypertension Other   °• Breast  cancer Other   °• Hypertension Mother   °• Diabetes Sister   °• Hypertension Sister   °• Breast cancer Daughter   °• Cancer Neg Hx   °• Early death Neg Hx   °• Hearing loss Neg Hx   °• Heart disease Neg Hx   °• Hyperlipidemia Neg Hx   °• Kidney disease Neg Hx   °• Stroke Neg Hx   °• Colon cancer Neg Hx   ° ° °Social history: °Lives in a home without carpeting with electric heating and central cooling.  No concern for water damage, mildew or roaches in the home.  No pets in the home.  She is on disability.  No smoke exposure/history.  ° °Medication List: °Allergies as of 08/01/2019   °   Reactions  ° Verapamil Swelling  °  °  °Medication List  °  °  ° Accurate as of August 01, 2019 10:21 AM. If you have any questions, ask your nurse or doctor.  °  °  °  °STOP taking these medications   °HYDROcodone-acetaminophen 5-325 MG tablet °Commonly known as: NORCO/VICODIN °Stopped by: Shaylar Patricia Padgett, MD °  °  °TAKE these medications   °Accu-Chek Guide test strip °Generic drug: glucose blood °1 each by Other route 2 (two) times daily. Use as instructed °  °accu-chek soft touch lancets °Use to test blood sugar twice daily. DX: E11.8 °  °albuterol 108 (90 Base) MCG/ACT inhaler °Commonly known as: VENTOLIN HFA °Inhale 2 puffs into the lungs every 4 (four) hours as needed for wheezing or shortness of breath (cough, shortness of breath or wheezing.). °  °aluminum chloride 20 % external solution °Commonly known as: Drysol °Apply topically at bedtime. °  °blood glucose meter kit and supplies Kit °Use to test blood sugar twice daily. DX: E11.8 °  °levocetirizine 5 MG tablet °Commonly known as: Xyzal °Take 1 tablet (5 mg total) by mouth every evening. °  °mometasone-formoterol 100-5 MCG/ACT Aero °Commonly known as: Dulera °Inhale 2 puffs into the lungs 2 (two) times daily. °  °rosuvastatin 20 MG tablet °Commonly known as: CRESTOR °Take 1 tablet (20 mg total) by mouth daily. °  °traMADol 50 MG tablet °Commonly known as:  ULTRAM °  °triamcinolone cream 0.5 % °Commonly known as: KENALOG °Apply 1 application topically 3 (three) times daily. °  °triamterene-hydrochlorothiazide 75-50 MG tablet °Commonly known as: Maxzide °Take 1 tablet by mouth daily. °  °  ° ° °Known medication allergies: °Allergies  °Allergen Reactions  °• Verapamil Swelling  ° ° ° °Physical examination: °Blood pressure 118/78, pulse 85, temperature 97.8 °F (36.6 °C), temperature source Temporal, resp. rate 16, height 5' 1" (1.549 m), weight 221 lb 9.6 oz (100.5 kg), last menstrual period 05/17/2012, SpO2 98 %. ° °General: Alert, interactive, in no acute distress. °HEENT:   PERRLA, TMs pearly gray, turbinates mildly edematous without discharge, post-pharynx non erythematous. °Neck: Supple without lymphadenopathy. °Lungs: Clear to auscultation without wheezing, rhonchi or rales. {no increased work of breathing. °CV: Normal S1, S2 without murmurs. °Abdomen: Nondistended, nontender. °Skin: many hyperpigmented, excoriated macules over lateral upper arms, upper back, lower leg near ankles.  no active lesions or blistering. °Extremities:  No clubbing, cyanosis or edema. °Neuro:   Grossly intact. ° °Diagnositics/Labs: °Labs:  °Punch biopsy pathology 07/04/19 -  °There is a mostly perivascular mixed infiltrate of lymphocytes, histiocytes and eosinophils in the dermis. The epidermis is acanthotic with scaterred necrotic keratinocytes. This pattern is referred to as a dermal hypersensitivity reaction, which is most consistent with a drug eruption in this setting. A hypersensitivity reaction induced by an arthropod bite °or by another allergen is in the differential diagnosis. Following review of the hematoxylin and eosin sections, a PAS stain was obtained to exclude a fungal infection. The PAS stain is negative for fungal organisms. ° °Spirometry: FEV1: 1.78L 95%, FVC: 1.93L 81%, ratio consistent with nonobstructive pattern ° °Allergy testing: environmental allergy skin prick  testing is positive to grass pollen, weed pollen, tree pollen, dust mites.  °Milk and casein pricks are negative.   °Allergy testing results were read and interpreted by provider, documented by clinical staff. 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  This indicates that some allergen is the trigger however this allergen could be an environmental allergen, a drug allergen, a skin contact allergen for examples. ° - unclear if this rash is triggered by Tramadol.  You had been on this medication for 2.5 years without previous issue thus it does make it less likely a trigger of the rash.  However since stopping tramadol the rash has improved.  This too could be due to use of Xyzal and Triamcinolone working to treat the rash as well. There are no testing modalities to determine if Tramadol is the cause.   Would not recommend challenging to Tramadol at this time and would continue to avoid.   Consider challenging in the future as not concerned for serious drug eruptions like SJS/TEN, serum sickness, vasculitis ° - environmental allergy skin testing done today is positive to grass pollens, tree pollens, weed pollens, dust mites.  Allergen avoidance measures discussed/handouts provided.  Do your best to avoid these allergens.  ° - skin testing to milk and casein were negative thus likely you are lactose intolerant.  ° - recommend performing patch testing.  Patch testing evaluates for contact dermatitis.  Patches are best placed on Mondays with return to office on Wednesday and Friday of same week for readings.  Once patches are in place on back they are not to get wet.  You can continue to take your Xyzal while patches are in place.  Below are the chemicals/compounds tested from in patch testing.  ° - if rash returns or worsens would recommend dermatology referral ° - continue use of triamcinolone 1 thin application twice a day as needed for rash ° - continue  Xyzal 5mg daily. ° °Asthma, mild intermittent ° - lung function testing looks good ° - have access to albuterol inhaler 2 puffs every 4-6 hours as needed for cough/wheeze/shortness of breath/chest tightness.  May use 15-20 minutes prior to activity.   Monitor frequency of use.   ° - continue Dulera 100mcg 2 puffs twice a day ° °Asthma control goals:  °· Full participation in all desired   activities (may need albuterol before activity)  Albuterol use two time or less a week on average (not counting use with activity)  Cough interfering with sleep two time or less a month  Oral steroids no more than once a year  No hospitalizations  Allergic rhinitis with conjunctivitis  - environmental allergy testing as above it positive to pollens and dust mites  - continue Xyzal 30m daily as needed for allergy symptom control  - for nasal congestion/drainage can use over-the-counter Flonase, Rhinocort or Nasacort 2 sprays each nostril daily as needed  - for itchy/watery/red eyes can use over-the-counter Pataday 1 drop each eye daily as needed.   Eczema  - daily moisturization is key  - continue triamcinolone as above  Food intolerance  - milk testing is negative.  Thus likely have lactose intolerance causing GI upset with dairy ingestion.  Can try use of Lactaid milk or Lactaid pills prior to dairy ingestion to see if this prevents GI symptoms.     Follow-up for patch testing and routine in 4-6 months  I appreciate the opportunity to take part in Pihu's care. Please do not hesitate to contact me with questions.  Sincerely,   SPrudy Feeler MD Allergy/Immunology [facility] [place] [place]

## 2019-08-09 ENCOUNTER — Other Ambulatory Visit: Payer: Self-pay

## 2019-08-09 ENCOUNTER — Emergency Department (HOSPITAL_COMMUNITY)
Admission: EM | Admit: 2019-08-09 | Discharge: 2019-08-09 | Discharge status: Against Medical Advice | Payer: Medicare Other | Attending: Emergency Medicine | Admitting: Emergency Medicine

## 2019-08-09 ENCOUNTER — Encounter (HOSPITAL_COMMUNITY): Payer: Self-pay | Admitting: Emergency Medicine

## 2019-08-09 DIAGNOSIS — K5900 Constipation, unspecified: Secondary | ICD-10-CM | POA: Diagnosis present

## 2019-08-09 DIAGNOSIS — Z5321 Procedure and treatment not carried out due to patient leaving prior to being seen by health care provider: Secondary | ICD-10-CM | POA: Insufficient documentation

## 2019-08-09 LAB — URINALYSIS, ROUTINE W REFLEX MICROSCOPIC
Bilirubin Urine: NEGATIVE
Glucose, UA: NEGATIVE mg/dL
Ketones, ur: NEGATIVE mg/dL
Nitrite: NEGATIVE
Protein, ur: NEGATIVE mg/dL
Specific Gravity, Urine: 1.013 (ref 1.005–1.030)
pH: 6 (ref 5.0–8.0)

## 2019-08-09 LAB — COMPREHENSIVE METABOLIC PANEL
ALT: 25 U/L (ref 0–44)
AST: 26 U/L (ref 15–41)
Albumin: 3.9 g/dL (ref 3.5–5.0)
Alkaline Phosphatase: 130 U/L — ABNORMAL HIGH (ref 38–126)
Anion gap: 11 (ref 5–15)
BUN: 27 mg/dL — ABNORMAL HIGH (ref 6–20)
CO2: 27 mmol/L (ref 22–32)
Calcium: 9.9 mg/dL (ref 8.9–10.3)
Chloride: 98 mmol/L (ref 98–111)
Creatinine, Ser: 1.91 mg/dL — ABNORMAL HIGH (ref 0.44–1.00)
GFR calc Af Amer: 33 mL/min — ABNORMAL LOW (ref >60)
GFR calc non Af Amer: 28 mL/min — ABNORMAL LOW (ref >60)
Glucose, Bld: 128 mg/dL — ABNORMAL HIGH (ref 70–99)
Potassium: 3.5 mmol/L (ref 3.5–5.1)
Sodium: 136 mmol/L (ref 135–145)
Total Bilirubin: 0.6 mg/dL (ref 0.3–1.2)
Total Protein: 8.6 g/dL — ABNORMAL HIGH (ref 6.5–8.1)

## 2019-08-09 LAB — CBC WITH DIFFERENTIAL/PLATELET
Abs Immature Granulocytes: 0.04 10*3/uL (ref 0.00–0.07)
Basophils Absolute: 0 10*3/uL (ref 0.0–0.1)
Basophils Relative: 0 %
Eosinophils Absolute: 0 10*3/uL (ref 0.0–0.5)
Eosinophils Relative: 0 %
HCT: 42.6 % (ref 36.0–46.0)
Hemoglobin: 13.8 g/dL (ref 12.0–15.0)
Immature Granulocytes: 0 %
Lymphocytes Relative: 18 %
Lymphs Abs: 1.6 10*3/uL (ref 0.7–4.0)
MCH: 26.1 pg (ref 26.0–34.0)
MCHC: 32.4 g/dL (ref 30.0–36.0)
MCV: 80.7 fL (ref 80.0–100.0)
Monocytes Absolute: 0.7 10*3/uL (ref 0.1–1.0)
Monocytes Relative: 8 %
Neutro Abs: 6.7 10*3/uL (ref 1.7–7.7)
Neutrophils Relative %: 74 %
Platelets: 261 10*3/uL (ref 150–400)
RBC: 5.28 MIL/uL — ABNORMAL HIGH (ref 3.87–5.11)
RDW: 14.3 % (ref 11.5–15.5)
WBC: 9.1 10*3/uL (ref 4.0–10.5)
nRBC: 0 % (ref 0.0–0.2)

## 2019-08-09 NOTE — ED Notes (Signed)
Patient states her husband has to be at work at [employer] and she has to go because he is her ride. Advised patient to stay but patient left.

## 2019-08-09 NOTE — ED Triage Notes (Signed)
Patient reports persistent constipation this week unrelieved by laxative , patient stated liquid stool oozing from rectum , denies abdominal pain , no fever or chills .

## 2019-08-13 ENCOUNTER — Ambulatory Visit: Payer: Medicare Other | Admitting: Allergy

## 2019-08-15 ENCOUNTER — Encounter: Payer: Medicare Other | Admitting: Allergy

## 2019-08-17 ENCOUNTER — Encounter: Payer: Medicare Other | Admitting: Allergy

## 2019-08-25 ENCOUNTER — Other Ambulatory Visit: Payer: Self-pay

## 2019-08-25 ENCOUNTER — Telehealth: Payer: Self-pay

## 2019-08-25 ENCOUNTER — Encounter (HOSPITAL_COMMUNITY): Payer: Self-pay

## 2019-08-25 ENCOUNTER — Ambulatory Visit (HOSPITAL_COMMUNITY)
Admission: EM | Admit: 2019-08-25 | Discharge: 2019-08-25 | Disposition: A | Discharge status: Home / Self Care | Payer: Medicare Other | Attending: Family Medicine | Admitting: Family Medicine

## 2019-08-25 DIAGNOSIS — M545 Low back pain, unspecified: Secondary | ICD-10-CM

## 2019-08-25 MED ORDER — CYCLOBENZAPRINE HCL 10 MG PO TABS: 10.0000 mg | ORAL_TABLET | Freq: Two times a day (BID) | ORAL | 0 refills | Status: DC | PRN

## 2019-08-25 MED ORDER — HYDROCODONE-ACETAMINOPHEN 5-325 MG PO TABS: 1.0000 | ORAL_TABLET | Freq: Four times a day (QID) | ORAL | 0 refills | Status: DC | PRN

## 2019-08-25 NOTE — ED Provider Notes (Signed)
[facility]    CSN: [id number] Arrival date & time: 08/25/19  1313      History   Chief Complaint Chief Complaint  Patient presents with  . Back Pain    HPI [name] is Crystal 58 y.o. Berry.  [name] urgent care patient HPI [name] who presents with chronic low back pain.  She states that she has had arthritis in her back for Crystal long time and has been disabled because of it.  3 days ago, she was bending over and had acute sharp pain that goes across the lower part of her back.  Patient is having no abdominal pain.  She said no fever.  She is having no pain radiating down her legs.  She does have tenderness in the low back.  She is trying to have her husband massage her back but Crystal is not helped.  She is having difficulty sleeping.  Patient has had problems with renal functions in the past and she prefers to stay away from medicines Crystal good cause any deterioration of renal function.  She is also had diabetes.  Patient states that the pain is less intense when she sits or lies on her stomach.  Any movement makes the pain worse. Past Medical History:  Diagnosis Date  . Allergy    rhinitis  . Anemia   . Arthritis   . Asthma   . Headache(784.0)   . History of stomach ulcers   . Hypertension   . Internal hemorrhoids     Patient Active Problem List   Diagnosis Date Noted  . Laceration of left ring finger without foreign body without damage to nail 07/19/2019  . Hyperglycemia 07/11/2019  . 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 Type II diabetes mellitus with manifestations ([place]) 07/11/2019  . Intrinsic eczema 07/04/2019  . CKD (chronic kidney disease) stage 3, GFR 30-59 ml/min 10/11/2018  . Morbid obesity ([place]) 09/14/2018  . Body mass index 45.0-49.9, adult ([place]) 09/14/2018  . Chronic right-sided low back pain with right-sided sciatica 09/14/2018  . Hyperlipidemia with target LDL less than 130 07/05/2018  . Mild  intermittent asthma without complication 49/70/2637  . Snoring 12/14/2016  . Hypertension 12/14/2016  . Gastroesophageal reflux disease with esophagitis 08/26/2016  . Dysphagia 08/26/2016  . Spondylolisthesis of lumbar region 05/28/2016  . Trochanteric bursitis of left hip 02/09/2016  . Trochanteric bursitis, right hip 01/12/2016  . Fibromyalgia 08/08/2015  . Lumbar facet arthropathy 08/08/2015  . Primary osteoarthritis of both knees 08/25/2014  . Routine general medical examination at [facility] health care facility 06/01/2013  . Encounter for screening mammogram for breast cancer 06/01/2013  . Back pain without radiation 08/29/2012  . Constipation 11/15/2011  . Abnormal electrocardiogram 02/05/2010  . ALLERGIC RHINITIS 01/26/2010    Past Surgical History:  Procedure Laterality Date  . CARPAL TUNNEL RELEASE Right   . TUBAL LIGATION      OB History   No obstetric history on file.      Home Medications    Prior to Admission medications   Medication Sig Start Date End Date Taking? Authorizing Provider  albuterol (PROVENTIL HFA;VENTOLIN HFA) 108 (90 Base) MCG/ACT inhaler Inhale 2 puffs into the lungs every 4 (four) hours as needed for wheezing or shortness of breath (cough, shortness of breath or wheezing.). 03/11/18   Robyn Haber, MD  aluminum chloride (DRYSOL) 20 % external solution Apply topically at bedtime. 03/08/17   Landis Martins, DPM  blood glucose meter  kit and supplies KIT Use to test blood sugar twice daily. DX: E11.8 07/30/19   [name], MD  cyclobenzaprine (FLEXERIL) 10 MG tablet Take 1 tablet (10 mg total) by mouth 2 (two) times daily as needed for muscle spasms. 08/25/19   Robyn Haber, MD  glucose blood (ACCU-CHEK GUIDE) test strip 1 each by Other route 2 (two) times daily. Use as instructed 07/31/19   [name], MD  HYDROcodone-acetaminophen (NORCO) 5-325 MG tablet Take 1 tablet by mouth every 6 (six) hours as needed for moderate pain. 08/25/19    Robyn Haber, MD  Lancets (ACCU-CHEK SOFT TOUCH) lancets Use to test blood sugar twice daily. DX: E11.8 07/30/19   [name], MD  levocetirizine (XYZAL) 5 MG tablet Take 1 tablet (5 mg total) by mouth every evening. 07/04/19   [name], MD  mometasone-formoterol (DULERA) 100-5 MCG/ACT AERO Inhale 2 puffs into the lungs 2 (two) times daily. 07/05/18   [name], MD  rosuvastatin (CRESTOR) 20 MG tablet Take 1 tablet (20 mg total) by mouth daily. 07/11/19   [name], MD  traMADol Veatrice Bourbon) 50 MG tablet  07/20/19   [provider]  triamcinolone cream (KENALOG) 0.5 % Apply 1 application topically 3 (three) times daily. 07/11/19   [name], MD  triamterene-hydrochlorothiazide (MAXZIDE) 75-50 MG tablet Take 1 tablet by mouth daily. 05/21/19   [name], MD    Family History Family History  Problem Relation Age of Onset  . Arthritis Other   . Diabetes Other   . Hypertension Other   . Breast cancer Other   . Hypertension Mother   . Diabetes Sister   . Hypertension Sister   . Breast cancer Daughter   . Cancer Neg Hx   . Early death Neg Hx   . Hearing loss Neg Hx   . Heart disease Neg Hx   . Hyperlipidemia Neg Hx   . Kidney disease Neg Hx   . Stroke Neg Hx   . Colon cancer Neg Hx     Social History Social History   Tobacco Use  . Smoking status: Passive Smoke Exposure - Never Smoker  . Smokeless tobacco: Never Used  . Tobacco comment: husband smokes outside and inside the home  Substance Use Topics  . Alcohol use: No    Alcohol/week: 0.0 standard drinks  . Drug use: No     Allergies   Verapamil   Review of Systems Review of Systems  Constitutional: Negative.   Respiratory: Negative.   Gastrointestinal: Negative.   Genitourinary: Negative.   Musculoskeletal: Positive for back pain and gait problem.  Neurological: Negative for weakness.  All other systems reviewed and are negative.    Physical Exam Triage Vital Signs ED  Triage Vitals  Enc Vitals Group     BP 08/25/19 1410 124/86     Pulse Rate 08/25/19 1410 95     Resp 08/25/19 1410 18     Temp 08/25/19 1410 99.1 F (37.3 C)     Temp Source 08/25/19 1410 Oral     SpO2 08/25/19 1410 100 %     Weight 08/25/19 1409 260 lb (117.9 kg)     Height --      Head Circumference --      Peak Flow --      Pain Score 08/25/19 1409 8     Pain Loc --      Pain Edu? --      Excl.  in [place]? --    No data found.  Updated Vital Signs BP 124/86 (BP Location: Right Arm)   Pulse 95   Temp 99.1 F (37.3 C) (Oral)   Resp 18   Wt 117.9 kg   LMP 05/17/2012   SpO2 100%   BMI 49.13 kg/m    Physical Exam Vitals signs and nursing note reviewed.  Constitutional:      General: She is in acute distress.     Appearance: Normal appearance. She is obese. She is not ill-appearing or toxic-appearing.  HENT:     Head: Normocephalic.  Eyes:     Conjunctiva/sclera: Conjunctivae normal.  Neck:     Musculoskeletal: Normal range of motion and neck supple.  Cardiovascular:     Rate and Rhythm: Normal rate.     Pulses: Normal pulses.  Pulmonary:     Effort: Pulmonary effort is normal.     Breath sounds: Normal breath sounds.  Abdominal:     Palpations: Abdomen is soft.  Musculoskeletal:        General: Tenderness present. No swelling.     Comments: Patient is tender across her lower lumbar region in the back.  Straight leg raising is negative.  Skin:    General: Skin is warm and dry.  Neurological:     General: No focal deficit present.     Mental Status: She is alert.     Gait: Gait abnormal.  Psychiatric:        Mood and Affect: Mood normal.        Behavior: Behavior normal.      UC Treatments / Results  Labs (all labs ordered are listed, but only abnormal results are displayed) Labs Reviewed - No data to display  EKG   Radiology No results found.  Procedures Procedures (including critical care time)  Medications Ordered in UC Medications - No  data to display  Initial Impression / Assessment and Plan / UC Course  I have reviewed the triage vital signs and the nursing notes.  Pertinent labs & imaging results that were available during my care of the patient were reviewed by me and considered in my medical decision making (see chart for details).    Final Clinical Impressions(s) / UC Diagnoses   Final diagnoses:  Acute bilateral low back pain without sciatica   Discharge Instructions   None    ED Prescriptions    Medication Sig Dispense Auth. Provider   HYDROcodone-acetaminophen (NORCO) 5-325 MG tablet Take 1 tablet by mouth every 6 (six) hours as needed for moderate pain. 12 tablet Robyn Haber, MD   cyclobenzaprine (FLEXERIL) 10 MG tablet Take 1 tablet (10 mg total) by mouth 2 (two) times daily as needed for muscle spasms. 20 tablet Robyn Haber, MD     I have reviewed the PDMP during Crystal encounter.   Robyn Haber, MD 08/25/19 1437

## 2019-08-25 NOTE — ED Triage Notes (Signed)
Pt states she having back pain. Pt states she has a on going back issue with her back.

## 2019-08-27 ENCOUNTER — Other Ambulatory Visit: Payer: Self-pay

## 2019-08-27 ENCOUNTER — Ambulatory Visit (INDEPENDENT_AMBULATORY_CARE_PROVIDER_SITE_OTHER)
Admission: RE | Admit: 2019-08-27 | Discharge: 2019-08-27 | Disposition: A | Discharge status: Home / Self Care | Payer: Medicare Other | Source: Ambulatory Visit | Attending: Internal Medicine | Admitting: Internal Medicine

## 2019-08-27 ENCOUNTER — Ambulatory Visit (INDEPENDENT_AMBULATORY_CARE_PROVIDER_SITE_OTHER): Payer: Medicare Other | Admitting: Internal Medicine

## 2019-08-27 ENCOUNTER — Telehealth: Payer: Self-pay | Admitting: Internal Medicine

## 2019-08-27 ENCOUNTER — Encounter: Payer: Self-pay | Admitting: Internal Medicine

## 2019-08-27 VITALS — BP 130/80 | HR 77 | Temp 98.1°F | Ht 61.0 in | Wt 220.0 lb

## 2019-08-27 DIAGNOSIS — N183 Chronic kidney disease, stage 3 unspecified: Secondary | ICD-10-CM

## 2019-08-27 DIAGNOSIS — M545 Low back pain: Secondary | ICD-10-CM | POA: Diagnosis not present

## 2019-08-27 DIAGNOSIS — M5441 Lumbago with sciatica, right side: Secondary | ICD-10-CM | POA: Diagnosis not present

## 2019-08-27 MED ORDER — METHYLPREDNISOLONE ACETATE 40 MG/ML IJ SUSP
40.0000 mg | Freq: Once | INTRAMUSCULAR | Status: AC
  Administered 2019-08-27: 16:00:00 40 mg via INTRAMUSCULAR

## 2019-08-27 NOTE — Progress Notes (Signed)
   Subjective:   Patient ID: [name], female    DOB: Sep 21, 1961, 58 y.o.   MRN: [id number]  HPI The patient is a 58 YO female coming in for concerns about back pain. Started Friday night. 10/10 pain. Having radiation to the right leg and some weakness in that leg. Has fallen since then but cannot describe well fall or falls or if any injury from fall. Denies fevers or chills. Denies loss of sensation in the legs or bowels or bladder. No urine or bowel incontinence. Has tried hydrocodone and flexeril and this is helping for several hours at a time. Sleeping is still a struggle. Overall it is not improving. She has prior prescription for tramadol from her PCP and she states that this was stopped and replaced with something else recently. She is not sure the name of this but admits that this was several weeks ago. PDMP shows tramadol was filled 08/22/19 for her usual amount of 100 per month. She has stopped taking tramadol since she is now taking hydrocodone every 6 hours. She is using the muscle relaxer at night time since it makes her drowsy. She denies constipation or diarrhea. Denies injury or overuse.   PMH, [facility], social history reviewed and updated.   Review of Systems  Constitutional: Positive for activity change and appetite change.  HENT: Negative.   Eyes: Negative.   Respiratory: Negative for cough, chest tightness and shortness of breath.   Cardiovascular: Negative for chest pain, palpitations and leg swelling.  Gastrointestinal: Negative for abdominal distention, abdominal pain, constipation, diarrhea, nausea and vomiting.  Musculoskeletal: Positive for arthralgias, back pain and myalgias.  Skin: Negative.   Neurological: Negative.   Psychiatric/Behavioral: Negative.     Objective:  Physical Exam Constitutional:      Appearance: She is well-developed. She is obese.  HENT:     Head: Normocephalic and atraumatic.  Neck:     Musculoskeletal: Normal range of motion.   Cardiovascular:     Rate and Rhythm: Normal rate and regular rhythm.  Pulmonary:     Effort: Pulmonary effort is normal. No respiratory distress.     Breath sounds: Normal breath sounds. No wheezing or rales.  Abdominal:     General: Bowel sounds are normal. There is no distension.     Palpations: Abdomen is soft.     Tenderness: There is no abdominal tenderness. There is no rebound.  Musculoskeletal:        General: Tenderness present.     Comments: Tenderness diffuse throughout spine but more prominent paraspinal bilateral in the lumbar region, sacral without as much tenderness to palpation.   Skin:    General: Skin is warm and dry.  Neurological:     Mental Status: She is alert and oriented to person, place, and time.     Coordination: Coordination normal.     Vitals:   08/27/19 1539  BP: 130/80  Pulse: 77  Temp: 98.1 F (36.7 C)  TempSrc: Oral  SpO2: 99%  Weight: 220 lb (99.8 kg)  Height: 5\' 1"  (1.549 m)   Visit time 25 minutes: greater than 50% of that time was spent in face to face counseling and coordination of care with the patient: counseled about etiology, need for x-ray, need to avoid NSAIDs given her poor renal function  Assessment & Plan:  Depo-medrol 40 mg IM given at visit

## 2019-08-27 NOTE — Patient Instructions (Addendum)
We have given you a shot today which can take 2-3 days to kick in.   Keep taking the medicine from the urgent care in the meantime.   We are checking an x-ray of the back today.

## 2019-08-27 NOTE — Telephone Encounter (Signed)
Patient spoke with [facility] on 08/25/19 at 8:46am stating that she was having back pain that started the day prior after she bent down to pick something up.  Patient does have appt today with Dr. Sharlet Salina.

## 2019-08-28 NOTE — Assessment & Plan Note (Addendum)
Given depo-medrol 40 mg IM today. Cannot take NSAIDs with her CKD. It is a little suspicious that she is telling me she was taken off tramadol and switched to something different but cannot name that medication and still filling tramadol as well she has not had any kind of visit with PCP in some time. She did not ask for further narcotics but the history was not consistent with fill pattern. [place] narcotic database reviewed during visit and assisted in decision making. Can continue the hydrocodone given by ER and flexeril. Can use tylenol. Reminded not to take any NSAIDs. Ordered x-ray today to assess for any changes.

## 2019-08-28 NOTE — Assessment & Plan Note (Signed)
Advised that she cannot take nsaids or toradol for pain given her CKD.

## 2019-08-29 ENCOUNTER — Ambulatory Visit: Payer: Medicare Other

## 2019-08-29 NOTE — Progress Notes (Unsigned)
Subjective:   [name] is a 58 y.o. female who presents for Medicare Annual (Subsequent) preventive examination. I connected with patient by a telephone and verified that I am speaking with the correct person using two identifiers. Patient stated full name and DOB. Patient gave permission to continue with telephonic visit. Patient's location was at home and Nurse's location was at [place] office. Participants during this visit included patient and nurse.  Review of Systems:     Sleep patterns: {SX; SLEEP PATTERNS:18802::"feels rested on waking","does not get up to void","gets up *** times nightly to void","sleeps *** hours nightly"}.    Home Safety/Smoke Alarms: Feels safe in home. Smoke alarms in place.  Living environment; residence and Firearm Safety: {Rehab home environment / accessibility:30080::"no firearms","firearms stored safely"}. Seat Belt Safety/Bike Helmet: Wears seat belt.     Objective:     Vitals: LMP 05/17/2012   There is no height or weight on file to calculate BMI.  Advanced Directives 05/28/2016 05/18/2016 04/06/2016 02/20/2016 01/30/2016 01/12/2016 12/19/2015  Does Patient Have a Medical Advance Directive? _0  No No  Would patient like information on creating a medical advance directive? - - - - - - -    Tobacco Social History   Tobacco Use  Smoking Status Passive Smoke Exposure - Never Smoker  Smokeless Tobacco Never Used  Tobacco Comment   husband smokes outside and inside the home     Counseling given: Not Answered Comment: husband smokes outside and inside the home  Past Medical History:  Diagnosis Date  . Allergy    rhinitis  . Anemia   . Arthritis   . Asthma   . Headache(784.0)   . History of stomach ulcers   . Hypertension   . Internal hemorrhoids    Past Surgical History:  Procedure Laterality Date  . CARPAL TUNNEL RELEASE Right   . TUBAL LIGATION     Family History  Problem Relation Age of Onset  . Arthritis Other   .  Diabetes Other   . Hypertension Other   . Breast cancer Other   . Hypertension Mother   . Diabetes Sister   . Hypertension Sister   . Breast cancer Daughter   . Cancer Neg Hx   . Early death Neg Hx   . Hearing loss Neg Hx   . Heart disease Neg Hx   . Hyperlipidemia Neg Hx   . Kidney disease Neg Hx   . Stroke Neg Hx   . Colon cancer Neg Hx    Social History   Socioeconomic History  . Marital status: Married    Spouse name: Not on file  . Number of children: 4  . Years of education: 37  . Highest education level: Not on file  Occupational History  . Not on file  Social Needs  . Financial resource strain: Not on file  . Food insecurity    Worry: Not on file    Inability: Not on file  . Transportation needs    Medical: Not on file    Non-medical: Not on file  Tobacco Use  . Smoking status: Passive Smoke Exposure - Never Smoker  . Smokeless tobacco: Never Used  . Tobacco comment: husband smokes outside and inside the home  Substance and Sexual Activity  . Alcohol use: No    Alcohol/week: 0.0 standard drinks  . Drug use: No  . Sexual activity: Yes    Birth control/protection: Post-menopausal, Surgical  Lifestyle  . Physical activity  Days per week: Not on file    Minutes per session: Not on file  . Stress: Not on file  Relationships  . Social Herbalist on phone: Not on file    Gets together: Not on file    Attends religious service: Not on file    Active member of club or organization: Not on file    Attends meetings of clubs or organizations: Not on file    Relationship status: Not on file  Other Topics Concern  . Not on file  Social History Narrative   Drinks caffeine once a week     Outpatient Encounter Medications as of 08/29/2019  Medication Sig  . albuterol (PROVENTIL HFA;VENTOLIN HFA) 108 (90 Base) MCG/ACT inhaler Inhale 2 puffs into the lungs every 4 (four) hours as needed for wheezing or shortness of breath (cough, shortness of breath  or wheezing.).  Marland Kitchen aluminum chloride (DRYSOL) 20 % external solution Apply topically at bedtime.  . blood glucose meter kit and supplies KIT Use to test blood sugar twice daily. DX: E11.8  . cyclobenzaprine (FLEXERIL) 10 MG tablet Take 1 tablet (10 mg total) by mouth 2 (two) times daily as needed for muscle spasms.  Marland Kitchen glucose blood (ACCU-CHEK GUIDE) test strip 1 each by Other route 2 (two) times daily. Use as instructed  . HYDROcodone-acetaminophen (NORCO) 5-325 MG tablet Take 1 tablet by mouth every 6 (six) hours as needed for moderate pain.  . Lancets (ACCU-CHEK SOFT TOUCH) lancets Use to test blood sugar twice daily. DX: E11.8  . levocetirizine (XYZAL) 5 MG tablet Take 1 tablet (5 mg total) by mouth every evening.  . mometasone-formoterol (DULERA) 100-5 MCG/ACT AERO Inhale 2 puffs into the lungs 2 (two) times daily.  . rosuvastatin (CRESTOR) 20 MG tablet Take 1 tablet (20 mg total) by mouth daily.  . traMADol (ULTRAM) 50 MG tablet   . triamcinolone cream (KENALOG) 0.5 % Apply 1 application topically 3 (three) times daily.  Marland Kitchen triamterene-hydrochlorothiazide (MAXZIDE) 75-50 MG tablet Take 1 tablet by mouth daily.   No facility-administered encounter medications on file as of 08/29/2019.     Activities of Daily Living No flowsheet data found.  Patient Care Team: [name], MD as PCP - General Padgett, Rae Halsted, MD as Consulting Physician (Allergy)    Assessment:   This is a routine wellness examination for [name]. Physical assessment deferred to PCP.   Exercise Activities and Dietary recommendations   Diet (meal preparation, eat out, water intake, caffeinated beverages, dairy products, fruits and vegetables): {Desc; diets:16563}   Goals   None     Fall Risk Fall Risk  07/04/2019 07/07/2018 05/28/2016 05/18/2016 04/06/2016  Falls in the past year? 0 No No No No  Comment - - - - -  Number falls in past yr: 0 - - - -  Injury with Fall? 0 - - - -  Comment - - - - -   Follow up Falls evaluation completed - - - -    Depression Screen PHQ 2/9 Scores 07/04/2019 07/07/2018 07/05/2018 11/28/2015  PHQ - 2 Score 0 0 0 0  PHQ- 9 Score - - - -     Cognitive Function        Immunization History  Administered Date(s) Administered  . Influenza Split 09/27/2012  . Influenza,inj,Quad PF,6+ Mos 08/23/2014, 11/26/2015, 08/26/2016, 07/05/2018, 07/04/2019  . PPD Test 08/23/2014  . Pneumococcal Polysaccharide-23 09/27/2012  . Tdap 06/07/2010, 07/19/2019   Screening Tests Health Maintenance  Topic Date Due  . OPHTHALMOLOGY EXAM  06/26/1971  . URINE MICROALBUMIN  06/26/1971  . HEMOGLOBIN A1C  01/08/2020  . FOOT EXAM  07/18/2020  . MAMMOGRAM  01/02/2021  . PAP SMEAR-Modifier  07/07/2021  . COLONOSCOPY  01/29/2026  . TETANUS/TDAP  07/18/2029  . INFLUENZA VACCINE  Completed  . PNEUMOCOCCAL POLYSACCHARIDE VACCINE AGE 73-64 HIGH RISK  Completed  . Hepatitis C Screening  Completed  . HIV Screening  Completed      Plan:     I have personally reviewed and noted the following in the patient's chart:   . Medical and social history . Use of alcohol, tobacco or illicit drugs  . Current medications and supplements . Functional ability and status . Nutritional status . Physical activity . Advanced directives . List of other physicians . Screenings to include cognitive, depression, and falls . Referrals and appointments  In addition, I have reviewed and discussed with patient certain preventive protocols, quality metrics, and best practice recommendations. A written personalized care plan for preventive services as well as general preventive health recommendations were provided to patient.     Michiel Cowboy, RN  08/29/2019

## 2019-09-03 ENCOUNTER — Other Ambulatory Visit: Payer: Self-pay | Admitting: Internal Medicine

## 2019-09-03 DIAGNOSIS — E876 Hypokalemia: Secondary | ICD-10-CM

## 2019-09-03 DIAGNOSIS — T50905A Adverse effect of unspecified drugs, medicaments and biological substances, initial encounter: Secondary | ICD-10-CM

## 2019-09-03 DIAGNOSIS — I10 Essential (primary) hypertension: Secondary | ICD-10-CM

## 2019-09-03 MED ORDER — TRIAMTERENE-HCTZ 75-50 MG PO TABS: 1.0000 | ORAL_TABLET | Freq: Every day | ORAL | 0 refills | Status: DC

## 2019-09-03 NOTE — Telephone Encounter (Signed)
Medication Refill - Medication: triamterene-hydrochlorothiazide (MAXZIDE) 75-50 MG tablet    Has the patient contacted their pharmacy? Yes.   (Agent: If no, request that the patient contact the pharmacy for the refill.) (Agent: If yes, when and what did the pharmacy advise?)  Preferred Pharmacy (with phone number or street name): [facility] ([place]), [place] - [address] [phone] (Phone) [phone] (Fax)     Agent: Please be advised that RX refills may take up to 3 business days. We ask that you follow-up with your pharmacy.

## 2019-09-04 ENCOUNTER — Ambulatory Visit: Payer: Medicare Other | Admitting: *Deleted

## 2019-09-07 ENCOUNTER — Telehealth: Payer: Self-pay | Admitting: *Deleted

## 2019-09-07 ENCOUNTER — Ambulatory Visit (INDEPENDENT_AMBULATORY_CARE_PROVIDER_SITE_OTHER): Payer: Medicare Other | Admitting: *Deleted

## 2019-09-07 DIAGNOSIS — Z0000 Encounter for general adult medical examination without abnormal findings: Secondary | ICD-10-CM | POA: Diagnosis not present

## 2019-09-07 NOTE — Telephone Encounter (Signed)
During AWV, patient stated that she does not feel she has an allergy to tramadol. The rash she had has cleared up and she has taken tramadol long-term prior to the breakout. Patient asked if she could receive an order for prescription tramadol and have the Hydrocodone prescription discontinued. Adding that the Hydrocodone gives her extreme constipation and she cannot tolerate the medication.

## 2019-09-07 NOTE — Progress Notes (Addendum)
Subjective:   [name] is a 58 y.o. female who presents for Medicare Annual (Subsequent) preventive examination. I connected with patient by a telephone and verified that I am speaking with the correct person using two identifiers. Patient stated full name and DOB. Patient gave permission to continue with telephonic visit. Patient's location was at home and Nurse's location was at [place] office. Participants during this visit included patient and nurse.  Review of Systems:   Cardiac Risk Factors include: advanced age (>27mn, >>68women);diabetes mellitus;dyslipidemia;obesity (BMI >30kg/m2);hypertension Sleep patterns: feels rested on waking, gets up 1-2 times nightly to void and sleeps 7-8 hours nightly.    Home Safety/Smoke Alarms: Feels safe in home. Smoke alarms in place.  Living environment; residence and Firearm Safety: 1-story house/ trailer, equipment: CRadio producer Type: [place]ayce Seat Belt Safety/Bike Helmet: Wears seat belt.      Objective:     Vitals: LMP 05/17/2012   There is no height or weight on file to calculate BMI.  Advanced Directives 09/07/2019 05/28/2016 05/18/2016 04/06/2016 02/20/2016 01/30/2016 01/12/2016  Does Patient Have a Medical Advance Directive? No No No No No No No  Would patient like information on creating a medical advance directive? No - Patient declined - - - - - -    Tobacco Social History   Tobacco Use  Smoking Status Passive Smoke Exposure - Never Smoker  Smokeless Tobacco Never Used  Tobacco Comment   husband smokes outside and inside the home     Counseling given: Not Answered Comment: husband smokes outside and inside the home   Past Medical History:  Diagnosis Date  . Allergy    rhinitis  . Anemia   . Arthritis   . Asthma   . [place]   . Headache(784.0)   . History of stomach ulcers   . Hypertension   . Internal hemorrhoids    Past Surgical History:  Procedure Laterality Date  .  CARPAL TUNNEL RELEASE Right   . TUBAL LIGATION     Family History  Problem Relation Age of Onset  . Arthritis Other   . Diabetes Other   . Hypertension Other   . Breast cancer Other   . Hypertension Mother   . Diabetes Sister   . Hypertension Sister   . Breast cancer Daughter   . Cancer Neg Hx   . Early death Neg Hx   . Hearing loss Neg Hx   . Heart disease Neg Hx   . Hyperlipidemia Neg Hx   . Kidney disease Neg Hx   . Stroke Neg Hx   . Colon cancer Neg Hx    Social History   Socioeconomic History  . Marital status: Married    Spouse name: Not on file  . Number of children: 4  . Years of education: 175 . Highest education level: Not on file  Occupational History  . Occupation: disabilty  Social Needs  . Financial resource strain: Not hard at all  . Food insecurity    Worry: Never true    Inability: Never true  . Transportation needs    Medical: No    Non-medical: No  Tobacco Use  . Smoking status: Passive Smoke Exposure - Never Smoker  . Smokeless tobacco: Never Used  . Tobacco comment: husband smokes outside and inside the home  Substance and Sexual Activity  . Alcohol use: No    Alcohol/week: 0.0 standard drinks  . Drug use: No  . Sexual activity:  Yes    Birth control/protection: Post-menopausal, Surgical  Lifestyle  . Physical activity    Days per week: 3 days    Minutes per session: 30 min  . Stress: Not at all  Relationships  . Social connections    Talks on phone: More than three times a week    Gets together: More than three times a week    Attends religious service: More than 4 times per year    Active member of club or organization: Yes    Attends meetings of clubs or organizations: More than 4 times per year    Relationship status: Married  Other Topics Concern  . Not on file  Social History Narrative   Drinks caffeine once a week     Outpatient Encounter Medications as of 09/07/2019  Medication Sig  . albuterol (PROVENTIL HFA;VENTOLIN  HFA) 108 (90 Base) MCG/ACT inhaler Inhale 2 puffs into the lungs every 4 (four) hours as needed for wheezing or shortness of breath (cough, shortness of breath or wheezing.).  Marland Kitchen aluminum chloride (DRYSOL) 20 % external solution Apply topically at bedtime.  . blood glucose meter kit and supplies KIT Use to test blood sugar twice daily. DX: E11.8  . cyclobenzaprine (FLEXERIL) 10 MG tablet Take 1 tablet (10 mg total) by mouth 2 (two) times daily as needed for muscle spasms.  Marland Kitchen glucose blood (ACCU-CHEK GUIDE) test strip 1 each by Other route 2 (two) times daily. Use as instructed  . HYDROcodone-acetaminophen (NORCO) 5-325 MG tablet Take 1 tablet by mouth every 6 (six) hours as needed for moderate pain.  . Lancets (ACCU-CHEK SOFT TOUCH) lancets Use to test blood sugar twice daily. DX: E11.8  . levocetirizine (XYZAL) 5 MG tablet Take 1 tablet (5 mg total) by mouth every evening.  . mometasone-formoterol (DULERA) 100-5 MCG/ACT AERO Inhale 2 puffs into the lungs 2 (two) times daily.  . rosuvastatin (CRESTOR) 20 MG tablet Take 1 tablet (20 mg total) by mouth daily.  . traMADol (ULTRAM) 50 MG tablet   . triamcinolone cream (KENALOG) 0.5 % Apply 1 application topically 3 (three) times daily.  Marland Kitchen triamterene-hydrochlorothiazide (MAXZIDE) 75-50 MG tablet Take 1 tablet by mouth daily.   No facility-administered encounter medications on file as of 09/07/2019.     Activities of Daily Living In your present state of health, do you have any difficulty performing the following activities: 09/07/2019  Hearing? N  Vision? N  Difficulty concentrating or making decisions? N  Walking or climbing stairs? N  Dressing or bathing? N  Doing errands, shopping? N  Preparing Food and eating ? N  Using the Toilet? N  In the past six months, have you accidently leaked urine? N  Do you have problems with loss of bowel control? N  Managing your Medications? N  Managing your Finances? N  Housekeeping or managing your  Housekeeping? N  Some recent data might be hidden    Patient Care Team: [name], MD as PCP - General Padgett, Rae Halsted, MD as Consulting Physician (Allergy) Kennith Gain, MD as Consulting Physician (Allergy) Magnus Sinning, MD as Consulting Physician (Physical Medicine and Rehabilitation)    Assessment:   This is a routine wellness examination for [name]. Physical assessment deferred to PCP.  Exercise Activities and Dietary recommendations Current Exercise Habits: Home exercise routine, Type of exercise: walking, Time (Minutes): 30, Frequency (Times/Week): 3, Weekly Exercise (Minutes/Week): 90, Intensity: Mild, Exercise limited by: orthopedic condition(s) Diet (meal preparation, eat out, water intake, caffeinated beverages,  dairy products, fruits and vegetables): in general, a "healthy" diet  , well balanced Reports she has begun to change her eating habits to get her diabetes under control. I she begins diabetes classes in November.   Reviewed heart healthy and diabetic diet. Encouraged patient to increase daily water and healthy fluid intake.  Goals    . Patient Stated     I want to lose weight by increasing my physical activity and continuing to eat healthy to get my diabetes under control.        Fall Risk Fall Risk  09/07/2019 07/04/2019 07/07/2018 05/28/2016 05/18/2016  Falls in the past year? 1 0 No No No  Comment - - - - -  Number falls in past yr: 1 0 - - -  Injury with Fall? 0 0 - - -  Comment - - - - -  Risk for fall due to : Impaired balance/gait;Impaired mobility;History of fall(s) - - - -  Follow up - Falls evaluation completed - - -    Depression Screen PHQ 2/9 Scores 09/07/2019 07/04/2019 07/07/2018 07/05/2018  PHQ - 2 Score 1 0 0 0  PHQ- 9 Score - - - -     Cognitive Function       Ad8 score reviewed for issues:  Issues making decisions: no  Less interest in hobbies / activities: no  Repeats questions, stories (family  complaining): no  Trouble using ordinary gadgets (microwave, computer, phone):no  Forgets the month or year: no  Mismanaging finances: no  Remembering appts: no  Daily problems with thinking and/or memory: no Ad8 score is= 0  Immunization History  Administered Date(s) Administered  . Influenza Split 09/27/2012  . Influenza,inj,Quad PF,6+ Mos 08/23/2014, 11/26/2015, 08/26/2016, 07/05/2018, 07/04/2019  . PPD Test 08/23/2014  . Pneumococcal Polysaccharide-23 09/27/2012  . Tdap 06/07/2010, 07/19/2019   Screening Tests Health Maintenance  Topic Date Due  . OPHTHALMOLOGY EXAM  06/26/1971  . URINE MICROALBUMIN  06/26/1971  . HEMOGLOBIN A1C  01/08/2020  . FOOT EXAM  07/18/2020  . MAMMOGRAM  01/02/2021  . PAP SMEAR-Modifier  07/07/2021  . COLONOSCOPY  01/29/2026  . TETANUS/TDAP  07/18/2029  . INFLUENZA VACCINE  Completed  . PNEUMOCOCCAL POLYSACCHARIDE VACCINE AGE 8-64 HIGH RISK  Completed  . Hepatitis C Screening  Completed  . HIV Screening  Completed      Plan:    Reviewed health maintenance screenings with patient today and relevant education, vaccines, and/or referrals were provided.   I have personally reviewed and noted the following in the patient's chart:   . Medical and social history . Use of alcohol, tobacco or illicit drugs  . Current medications and supplements . Functional ability and status . Nutritional status . Physical activity . Advanced directives . List of other physicians . Screenings to include cognitive, depression, and falls . Referrals and appointments  In addition, I have reviewed and discussed with patient certain preventive protocols, quality metrics, and best practice recommendations. A written personalized care plan for preventive services as well as general preventive health recommendations were provided to patient.     Michiel Cowboy, RN  09/07/2019    Medical screening examination/treatment/procedure(s) were performed by  non-physician practitioner and as supervising physician I was immediately available for consultation/collaboration. I agree with above. Binnie Rail, MD

## 2019-09-08 ENCOUNTER — Other Ambulatory Visit: Payer: Self-pay | Admitting: Internal Medicine

## 2019-09-08 DIAGNOSIS — M170 Bilateral primary osteoarthritis of knee: Secondary | ICD-10-CM

## 2019-09-08 DIAGNOSIS — M4316 Spondylolisthesis, lumbar region: Secondary | ICD-10-CM

## 2019-09-08 MED ORDER — TAPENTADOL HCL ER 100 MG PO TB12: 100.0000 mg | ORAL_TABLET | Freq: Two times a day (BID) | ORAL | 0 refills | Status: DC

## 2019-09-08 NOTE — Telephone Encounter (Signed)
Ask her to try nucynta RX sent  [place]

## 2019-09-12 ENCOUNTER — Telehealth: Payer: Self-pay | Admitting: Internal Medicine

## 2019-09-12 NOTE — Telephone Encounter (Signed)
Pt states the pharmacy does not have the nucynta as of yet in stock.  She states they told her tomorrow. She wanted dr to know. If it not in tomorrow what should she do?  [facility] ([place]), [place] - [address] [phone] (Phone) [phone] (Fax)

## 2019-09-13 ENCOUNTER — Other Ambulatory Visit: Payer: Self-pay | Admitting: Internal Medicine

## 2019-09-13 DIAGNOSIS — M170 Bilateral primary osteoarthritis of knee: Secondary | ICD-10-CM

## 2019-09-13 DIAGNOSIS — M4316 Spondylolisthesis, lumbar region: Secondary | ICD-10-CM

## 2019-09-13 MED ORDER — TAPENTADOL HCL ER 100 MG PO TB12: 100.0000 mg | ORAL_TABLET | Freq: Two times a day (BID) | ORAL | 0 refills | Status: DC

## 2019-09-13 NOTE — Telephone Encounter (Signed)
Called pharmacy - They still do not have the order. I have canceled the order with [facility].   Can you send a new rx to [facility] on [address]?

## 2019-09-14 NOTE — Telephone Encounter (Signed)
Pt contacted and informed rx was sent in.

## 2019-09-16 NOTE — Telephone Encounter (Signed)
error 

## 2019-09-17 ENCOUNTER — Other Ambulatory Visit: Payer: Self-pay | Admitting: Internal Medicine

## 2019-09-17 ENCOUNTER — Telehealth: Payer: Self-pay

## 2019-09-17 DIAGNOSIS — G8929 Other chronic pain: Secondary | ICD-10-CM

## 2019-09-17 DIAGNOSIS — M170 Bilateral primary osteoarthritis of knee: Secondary | ICD-10-CM

## 2019-09-17 DIAGNOSIS — M7061 Trochanteric bursitis, right hip: Secondary | ICD-10-CM

## 2019-09-17 DIAGNOSIS — M7062 Trochanteric bursitis, left hip: Secondary | ICD-10-CM

## 2019-09-17 DIAGNOSIS — M4316 Spondylolisthesis, lumbar region: Secondary | ICD-10-CM

## 2019-09-17 NOTE — Telephone Encounter (Signed)
Pt said she can not afford the below medication and is asking if there is something else hex can RX   tapentadol (NUCYNTA ER) 100 MG 12 hr tablet

## 2019-09-18 ENCOUNTER — Other Ambulatory Visit: Payer: Self-pay | Admitting: Internal Medicine

## 2019-09-18 MED ORDER — ACCU-CHEK SOFT TOUCH LANCETS MISC: 3 refills | Status: DC

## 2019-09-18 NOTE — Telephone Encounter (Signed)
Patient is calling back in regards. The Nucynta was dc'ed, is another rx going to be sent in to replace.   Please advise

## 2019-09-18 NOTE — Telephone Encounter (Signed)
Patient requesting call back from [place] regarding.

## 2019-09-18 NOTE — Telephone Encounter (Signed)
Lancets (ACCU-CHEK SOFT TOUCH) lancets      Patient is requesting refill.    Pharm[facility];  [facility] [place] - [address] [phone] (Phone) [phone] (Fax)

## 2019-09-19 NOTE — Telephone Encounter (Signed)
What is the cost of Nucynta?

## 2019-09-19 NOTE — Telephone Encounter (Signed)
Called pt and informed I was still waiting on response.

## 2019-09-20 NOTE — Telephone Encounter (Signed)
Pt stated that it cost $80.

## 2019-09-24 ENCOUNTER — Telehealth: Payer: Self-pay | Admitting: Internal Medicine

## 2019-09-24 NOTE — Telephone Encounter (Signed)
Pt called back in to follow up on message sent. Pt says that she is requesting. Pt says that she is having back pain.   Please advise.

## 2019-09-24 NOTE — Telephone Encounter (Signed)
Pt calling states that she has been requesting pain medication for over two weeks now.  I don't see anything in Epic.  Pt would like to speak with Dr. Ronnald Ramp nurse.

## 2019-09-25 ENCOUNTER — Other Ambulatory Visit: Payer: Self-pay | Admitting: Internal Medicine

## 2019-09-25 DIAGNOSIS — M170 Bilateral primary osteoarthritis of knee: Secondary | ICD-10-CM

## 2019-09-25 DIAGNOSIS — G8929 Other chronic pain: Secondary | ICD-10-CM

## 2019-09-25 DIAGNOSIS — M25532 Pain in left wrist: Secondary | ICD-10-CM

## 2019-09-25 MED ORDER — TRAMADOL HCL 50 MG PO TABS: 50.0000 mg | ORAL_TABLET | Freq: Four times a day (QID) | ORAL | 5 refills | Status: AC | PRN

## 2019-09-25 NOTE — Telephone Encounter (Signed)
Pt contacted and informed rx has been sent.  

## 2019-09-25 NOTE — Telephone Encounter (Signed)
RX sent to [facility]

## 2019-09-25 NOTE — Telephone Encounter (Signed)
Pt is requesting rx for tramadol.

## 2019-10-02 NOTE — Telephone Encounter (Signed)
Tramadol was sent in for patient. Pt was informed of same.

## 2019-10-17 ENCOUNTER — Emergency Department (HOSPITAL_COMMUNITY)
Admission: EM | Admit: 2019-10-17 | Discharge: 2019-10-17 | Disposition: A | Discharge status: Home / Self Care | Payer: Medicare Other | Attending: Emergency Medicine | Admitting: Emergency Medicine

## 2019-10-17 ENCOUNTER — Encounter (HOSPITAL_COMMUNITY): Payer: Self-pay | Admitting: Emergency Medicine

## 2019-10-17 ENCOUNTER — Emergency Department (HOSPITAL_COMMUNITY): Payer: Medicare Other

## 2019-10-17 ENCOUNTER — Other Ambulatory Visit: Payer: Self-pay

## 2019-10-17 ENCOUNTER — Ambulatory Visit: Payer: Self-pay | Admitting: *Deleted

## 2019-10-17 DIAGNOSIS — E876 Hypokalemia: Secondary | ICD-10-CM | POA: Diagnosis not present

## 2019-10-17 DIAGNOSIS — N183 Chronic kidney disease, stage 3 unspecified: Secondary | ICD-10-CM | POA: Diagnosis not present

## 2019-10-17 DIAGNOSIS — R079 Chest pain, unspecified: Secondary | ICD-10-CM | POA: Diagnosis not present

## 2019-10-17 DIAGNOSIS — Z7722 Contact with and (suspected) exposure to environmental tobacco smoke (acute) (chronic): Secondary | ICD-10-CM | POA: Diagnosis not present

## 2019-10-17 DIAGNOSIS — R0789 Other chest pain: Secondary | ICD-10-CM | POA: Diagnosis not present

## 2019-10-17 DIAGNOSIS — E1122 Type 2 diabetes mellitus with diabetic chronic kidney disease: Secondary | ICD-10-CM | POA: Insufficient documentation

## 2019-10-17 DIAGNOSIS — I129 Hypertensive chronic kidney disease with stage 1 through stage 4 chronic kidney disease, or unspecified chronic kidney disease: Secondary | ICD-10-CM | POA: Diagnosis not present

## 2019-10-17 LAB — CBC
HCT: 42.8 % (ref 36.0–46.0)
Hemoglobin: 13.4 g/dL (ref 12.0–15.0)
MCH: 25.5 pg — ABNORMAL LOW (ref 26.0–34.0)
MCHC: 31.3 g/dL (ref 30.0–36.0)
MCV: 81.5 fL (ref 80.0–100.0)
Platelets: 260 10*3/uL (ref 150–400)
RBC: 5.25 MIL/uL — ABNORMAL HIGH (ref 3.87–5.11)
RDW: 16.3 % — ABNORMAL HIGH (ref 11.5–15.5)
WBC: 5.4 10*3/uL (ref 4.0–10.5)
nRBC: 0 % (ref 0.0–0.2)

## 2019-10-17 LAB — BASIC METABOLIC PANEL
Anion gap: 12 (ref 5–15)
BUN: 23 mg/dL — ABNORMAL HIGH (ref 6–20)
CO2: 28 mmol/L (ref 22–32)
Calcium: 9.7 mg/dL (ref 8.9–10.3)
Chloride: 99 mmol/L (ref 98–111)
Creatinine, Ser: 1.45 mg/dL — ABNORMAL HIGH (ref 0.44–1.00)
GFR calc Af Amer: 46 mL/min — ABNORMAL LOW (ref >60)
GFR calc non Af Amer: 40 mL/min — ABNORMAL LOW (ref >60)
Glucose, Bld: 96 mg/dL (ref 70–99)
Potassium: 3.1 mmol/L — ABNORMAL LOW (ref 3.5–5.1)
Sodium: 139 mmol/L (ref 135–145)

## 2019-10-17 LAB — TROPONIN I (HIGH SENSITIVITY)
Troponin I (High Sensitivity): 3 ng/L (ref <18)
Troponin I (High Sensitivity): <2 ng/L (ref <18)

## 2019-10-17 MED ORDER — POTASSIUM CHLORIDE CRYS ER 20 MEQ PO TBCR: 20.0000 meq | EXTENDED_RELEASE_TABLET | Freq: Two times a day (BID) | ORAL | 0 refills | Status: DC

## 2019-10-17 MED ORDER — SODIUM CHLORIDE 0.9% FLUSH: 3.0000 mL | Freq: Once | INTRAVENOUS | Status: DC

## 2019-10-17 MED ORDER — POTASSIUM CHLORIDE CRYS ER 20 MEQ PO TBCR
40.0000 meq | EXTENDED_RELEASE_TABLET | Freq: Once | ORAL | Status: AC
  Administered 2019-10-17: 40 meq via ORAL
  Filled 2019-10-17: qty 2

## 2019-10-17 NOTE — ED Notes (Signed)
Woke this AM with R sided CP that does not radiate.  No n/V/, dizziness or SOB.  States pain resolves with rest.  Pain only appears with ambulation.  Alert and oriented with NAD.

## 2019-10-17 NOTE — ED Triage Notes (Signed)
C/o aching to R chest since this morning that is worse with walking.  Denies SOB or any other associated symptoms.

## 2019-10-17 NOTE — Telephone Encounter (Signed)
Pt called with complaints of chest discomfort when she got up and started walking; she says that it is not pain; the affected area is above her right breast; the pt says it feels like a squeezing sensation; recommendations made per nurse triage protocol; the pt verbalized understanding but would like to be seen in the office; the pt sees Dr [name], [facility]; per Almyra Free the pt should proceed to the ED; pt given information; will route to office for notification.  Reason for Disposition . Patient sounds very sick or weak to the triager  Answer Assessment - Initial Assessment Questions 1. LOCATION: "Where does it hurt?"       Above right breast 2. RADIATION: "Does the pain go anywhere else?" (e.g., into neck, jaw, arms, back)     no 3. ONSET: "When did the chest pain begin?" (Minutes, hours or days)      10/17/2019 at 0730 4. PATTERN "Does the pain come and go, or has it been constant since it started?"  "Does it get worse with exertion?"     Intermittent; worse with waklking 5. DURATION: "How long does it last" (e.g., seconds, minutes, hours)     seconds 6. SEVERITY: "How bad is the pain?"  (e.g., Scale 1-10; mild, moderate, or severe)    - MILD (1-3): doesn't interfere with normal activities     - MODERATE (4-7): interferes with normal activities or awakens from sleep    - SEVERE (8-10): excruciating pain, unable to do any normal activities       mild 7. CARDIAC RISK FACTORS: "Do you have any history of heart problems or risk factors for heart disease?" (e.g., angina, prior heart attack; diabetes, high blood pressure, high cholesterol, smoker, or strong family history of heart disease)     Diabetic, htn, high cholesterol 8. PULMONARY RISK FACTORS: "Do you have any history of lung disease?"  (e.g., blood clots in lung, asthma, emphysema, birth control pills)   asthma 9. CAUSE: "What do you think is causing the chest pain?"    Cholesterol medicine missed dose 10. OTHER SYMPTOMS: "Do you  have any other symptoms?" (e.g., dizziness, nausea, vomiting, sweating, fever, difficulty breathing, cough)       no 11. PREGNANCY: "Is there any chance you are pregnant?" "When was your last menstrual period?"       no  Protocols used: CHEST PAIN-A-AH

## 2019-10-17 NOTE — ED Provider Notes (Addendum)
[facility] EMERGENCY DEPARTMENT Provider Note   CSN: [id number] Arrival date & time: 10/17/19  0906     History   Chief Complaint Chief Complaint  Patient presents with   Chest Pain    HPI [name] is a 58 y.o. female with history of DM, HTN, HLD, obesity on rosuvastatin  HPI  Patient presents today for right-sided chest pain/left shoulder pain that is nonradiating, dull, achy, 2/10 and tight appeared to be exertional.  This morning when she was walking as she frequently does she states she had two episodes of this dull achy chest pain that would began when she was walking and subside when she stopped.  Patient has no history of similar episodes and states that she walks quite frequently.  Patient denies any shortness of breath, nausea, diaphoresis, lightheadedness or dizziness associated with these episodes.  Patient states that she called her primary care doctor who directed her to go to the ED. patient states she did stand up and walk after these episodes happened without any exertional chest pain.  Patient states she has never smoked, has no history of coronary artery disease and states that she has no first-degree relatives who had a heart attack before age 6. Denies any history of blood clots, denies any leg swelling cough or hemoptysis.  Denies any shortness of breath.  Denies any hormone use.  States she has no pain at this time and is completely symptom-free.  States that she sees her primary care doctor on a regular basis and feels she is close up with them.  States that she is seen by cardiologist several years ago but does not follow with them as was deemed not necessary after a stress test.  Past Medical History:  Diagnosis Date   Allergy    rhinitis   Anemia    Arthritis    Asthma    Diabetes mellitus without complication ([place])    [id number](179.1)    History of stomach ulcers    Hypertension    Internal hemorrhoids     Patient  Active Problem List   Diagnosis Date Noted   Laceration of left ring finger without foreign body without damage to nail 07/19/2019   Hyperglycemia 07/11/2019   Dermal hypersensitivity reaction 07/11/2019   Type II diabetes mellitus with manifestations ([place]) 07/11/2019   Intrinsic eczema 07/04/2019   CKD (chronic kidney disease) stage 3, GFR 30-59 ml/min 10/11/2018   Morbid obesity ([place]) 09/14/2018   Body mass index 45.0-49.9, adult ([place]) 09/14/2018   Chronic right-sided low back pain with right-sided sciatica 09/14/2018   Hyperlipidemia with target LDL less than 130 07/05/2018   Mild intermittent asthma without complication 50/56/9794   Snoring 12/14/2016   Hypertension 12/14/2016   Gastroesophageal reflux disease with esophagitis 08/26/2016   Dysphagia 08/26/2016   Spondylolisthesis of lumbar region 05/28/2016   Trochanteric bursitis of left hip 02/09/2016   Trochanteric bursitis, right hip 01/12/2016   Fibromyalgia 08/08/2015   Lumbar facet arthropathy 08/08/2015   Primary osteoarthritis of both knees 08/25/2014   Routine general medical examination at a health care facility 06/01/2013   Encounter for screening mammogram for breast cancer 06/01/2013   Acute bilateral low back pain with right-sided sciatica 08/29/2012   Constipation 11/15/2011   Abnormal electrocardiogram 02/05/2010   ALLERGIC RHINITIS 01/26/2010    Past Surgical History:  Procedure Laterality Date   CARPAL TUNNEL RELEASE Right    TUBAL LIGATION       OB History   No  obstetric history on file.      Home Medications    Prior to Admission medications   Medication Sig Start Date End Date Taking? Authorizing Provider  cyclobenzaprine (FLEXERIL) 10 MG tablet Take 1 tablet (10 mg total) by mouth 2 (two) times daily as needed for muscle spasms. 08/25/19  Yes Robyn Haber, MD  levocetirizine (XYZAL) 5 MG tablet Take 1 tablet (5 mg total) by mouth every evening. 07/04/19  Yes  [name], MD  mometasone-formoterol (DULERA) 100-5 MCG/ACT AERO Inhale 2 puffs into the lungs 2 (two) times daily. Patient taking differently: Inhale 2 puffs into the lungs 2 (two) times daily as needed for wheezing or shortness of breath.  07/05/18  Yes [name], MD  rosuvastatin (CRESTOR) 20 MG tablet Take 1 tablet (20 mg total) by mouth daily. 07/11/19  Yes [name], MD  traMADol (ULTRAM) 50 MG tablet Take 1 tablet (50 mg total) by mouth every 6 (six) hours as needed. 09/25/19 02/07/20 Yes [name], MD  triamcinolone cream (KENALOG) 0.5 % Apply 1 application topically 3 (three) times daily. 07/11/19  Yes [name], MD  triamterene-hydrochlorothiazide (MAXZIDE) 75-50 MG tablet Take 1 tablet by mouth daily. 09/03/19  Yes [name], MD  Accu-Chek FastClix Lancets MISC USE 1  TO CHECK GLUCOSE TWICE DAILY 09/18/19   [name], MD  albuterol (PROVENTIL HFA;VENTOLIN HFA) 108 (90 Base) MCG/ACT inhaler Inhale 2 puffs into the lungs every 4 (four) hours as needed for wheezing or shortness of breath (cough, shortness of breath or wheezing.). Patient not taking: Reported on 10/17/2019 03/11/18   Robyn Haber, MD  aluminum chloride (DRYSOL) 20 % external solution Apply topically at bedtime. Patient not taking: Reported on 10/17/2019 03/08/17   Landis Martins, DPM  blood glucose meter kit and supplies KIT Use to test blood sugar twice daily. DX: E11.8 07/30/19   [name], MD  glucose blood (ACCU-CHEK GUIDE) test strip 1 each by Other route 2 (two) times daily. Use as instructed 07/31/19   [name], MD  potassium chloride SA (KLOR-CON) 20 MEQ tablet Take 1 tablet (20 mEq total) by mouth 2 (two) times daily for 3 days. 10/17/19 10/20/19  Tedd Sias, PA    Family History Family History  Problem Relation Age of Onset   Arthritis Other    Diabetes Other    Hypertension Other    Breast cancer Other    Hypertension Mother    Diabetes Sister     Hypertension Sister    Breast cancer Daughter    Cancer Neg Hx    Early death Neg Hx    Hearing loss Neg Hx    Heart disease Neg Hx    Hyperlipidemia Neg Hx    Kidney disease Neg Hx    Stroke Neg Hx    Colon cancer Neg Hx     Social History Social History   Tobacco Use   Smoking status: Passive Smoke Exposure - Never Smoker   Smokeless tobacco: Never Used   Tobacco comment: husband smokes outside and inside the home  Substance Use Topics   Alcohol use: No    Alcohol/week: 0.0 standard drinks   Drug use: No     Allergies   Verapamil   Review of Systems Review of Systems  Constitutional: Negative for chills and fever.  HENT: Negative for congestion.   Eyes: Negative for pain.  Respiratory: Negative for cough and shortness of breath.   Cardiovascular:  Positive for chest pain. Negative for leg swelling.  Gastrointestinal: Negative for abdominal pain and vomiting.  Genitourinary: Negative for dysuria.  Musculoskeletal: Negative for myalgias.  Skin: Negative for rash.  Neurological: Negative for dizziness and headaches.     Physical Exam Updated Vital Signs BP (!) 152/82 (BP Location: Right Arm)    Pulse 62    Temp 98.8 F (37.1 C) (Oral)    Resp 16    Ht _0  (1.549 m)    Wt 108.9 kg    LMP 05/17/2012    SpO2 100%    BMI 45.35 kg/m   Physical Exam Vitals signs and nursing note reviewed.  Constitutional:      General: She is not in acute distress. HENT:     Head: Normocephalic and atraumatic.     Nose: Nose normal.  Eyes:     General: No scleral icterus. Neck:     Musculoskeletal: Normal range of motion.  Cardiovascular:     Rate and Rhythm: Normal rate and regular rhythm.     Pulses: Normal pulses.     Heart sounds: Normal heart sounds.  Pulmonary:     Effort: Pulmonary effort is normal. No respiratory distress.     Breath sounds: No wheezing.  Abdominal:     Palpations: Abdomen is soft.     Tenderness: There is no abdominal tenderness.   Musculoskeletal:     Right lower leg: No edema.     Left lower leg: No edema.     Comments: No reproducible chest wall tenderness to palpation  Skin:    General: Skin is warm and dry.     Capillary Refill: Capillary refill takes less than 2 seconds.     Comments: Legs with mild symmetric edema  Neurological:     Mental Status: She is alert. Mental status is at baseline.  Psychiatric:        Mood and Affect: Mood normal.        Behavior: Behavior normal.      ED Treatments / Results  Labs (all labs ordered are listed, but only abnormal results are displayed) Labs Reviewed  BASIC METABOLIC PANEL - Abnormal; Notable for the following components:      Result Value   Potassium 3.1 (*)    BUN 23 (*)    Creatinine, Ser 1.45 (*)    GFR calc non Af Amer 40 (*)    GFR calc Af Amer 46 (*)    All other components within normal limits  CBC - Abnormal; Notable for the following components:   RBC 5.25 (*)    MCH 25.5 (*)    RDW 16.3 (*)    All other components within normal limits  I-STAT BETA HCG BLOOD, ED ([facility], [facility], [facility] ONLY)  TROPONIN I (HIGH SENSITIVITY)  TROPONIN I (HIGH SENSITIVITY)    EKG EKG Interpretation  Date/Time:  Wednesday October 17 2019 11:50:38 EST Ventricular Rate:  62 PR Interval:    QRS Duration: 97 QT Interval:  525 QTC Calculation: 534 R Axis:   31 Text Interpretation: Sinus rhythm Borderline T abnormalities, anterior leads Prolonged QT interval No significant change since last tracing Confirmed by Deno Etienne [id number]) on 10/17/2019 11:54:24 AM   Radiology Dg Chest 2 View  Result Date: 10/17/2019 CLINICAL DATA:  Chest pain EXAM: CHEST - 2 VIEW COMPARISON:  02/01/2018 FINDINGS: The heart size and mediastinal contours are within normal limits. Both lungs are clear. The visualized skeletal structures are unremarkable. IMPRESSION: No acute cardiopulmonary  disease. Electronically Signed   By: Zetta Bills M.D.   On: 10/17/2019 09:55    Procedures Procedures  (including critical care time)  Medications Ordered in ED Medications  sodium chloride flush (NS) 0.9 % injection 3 mL (has no administration in time range)  potassium chloride SA (KLOR-CON) CR tablet 40 mEq (40 mEq Oral Given 10/17/19 1412)     Initial Impression / Assessment and Plan / ED Course  I have reviewed the triage vital signs and the nursing notes.  Pertinent labs & imaging results that were available during my care of the patient were reviewed by me and considered in my medical decision making (see chart for details).  Clinical Course as of Oct 17 1551  Wed Oct 17, 2019  1430 BUN/creatinine at baseline per patient.  Patient also has low potassium which she has had in the past.  Patient states she has been on potassium supplements in the past but her nephrologist took her off a while ago.  Basic metabolic panel(!) [WF]    Clinical Course User Index [WF] Tedd Sias, [place]    Presents to ED with exertional chest pain since this morning.  States the chest pain does not radiate, feels like a dull, achy tightness in her right shoulder that is 2/10 when it occurs.  Seems to improve when she is not exerting herself.  Does not occur at rest.  Has no associated shortness of breath nausea or diaphoresis  Patient given 40 mEq of potassium in ED.  I discussed this case with my attending physician who cosigned this note including patient's presenting symptoms, physical exam, and planned diagnostics and interventions. Attending physician stated agreement with plan or made changes to plan which were implemented.  Discussed patient presentation and heart score.   Patient has vitals within normal limits other than mild hypertension on second assessment.  Resting comfortably in bed on recheck.  Has no current symptoms and stated that when ambulating down the hall she did not feel any chest pain.   Chest x-ray without acute abnormality.  EKG dependently reviewed by myself nondescript T wave  changes.  No evidence of ischemia in the form of hyperacute T waves, ST elevation or depression or contiguous leads with TWI.  Prolonged QT likely due to hypokalemia.  Patient has a potassium of 3.1.  BUN and creatinine are within normal patient no leukocytosis initial troponin is 3 second in 2.   Shared decision making conversation with patient who states that she would prefer to be discharged home and states that she has ability to closely follow-up with her primary care doctor.  She states that she will also follow-up with cardiology very soon.  States she would prefer not to be admitted to the hospital.  We discussed the risk and benefits of this.   Patient has a history of hypokalemia.  Suspect that she is on a loop diuretic that this is causing this.  She has been on potassium supplementation in the past.  Will prescribe her 3 days of twice daily 20 mEq of potassium.  She will follow up with her primary care doctor within the week.  Has had normal magnesium levels in the past during episodes where she had low potassium.  Recommend that she have magnesium level checked during her follow-up visit with PCP.  Patient is eager to be discharged home.  Understand strict return precautions and importance of swift follow-up.  Final Clinical Impressions(s) / ED Diagnoses   Final diagnoses:  Atypical chest pain  Hypokalemia    ED Discharge Orders         Ordered    potassium chloride SA (KLOR-CON) 20 MEQ tablet  2 times daily     10/17/19 1549           Tedd Sias, [place] 10/17/19 1552    Deno Etienne, DO 10/17/19 1606    [address], [place], [place] 10/17/19 [place], DO 10/18/19 [phone]

## 2019-10-17 NOTE — Discharge Instructions (Addendum)
Please take potassium medication as prescribed.  Please  Try to schedule appointment for 1 week.  Please call cardiology group above for follow-up appointment.  Return to ED immediately if any new or concerning symptoms.

## 2019-10-18 ENCOUNTER — Ambulatory Visit (INDEPENDENT_AMBULATORY_CARE_PROVIDER_SITE_OTHER): Payer: Medicare Other | Admitting: Internal Medicine

## 2019-10-18 ENCOUNTER — Other Ambulatory Visit (INDEPENDENT_AMBULATORY_CARE_PROVIDER_SITE_OTHER): Payer: Medicare Other

## 2019-10-18 ENCOUNTER — Encounter: Payer: Self-pay | Admitting: Internal Medicine

## 2019-10-18 VITALS — BP 128/82 | HR 77 | Temp 98.1°F | Resp 16 | Ht 61.0 in | Wt 226.0 lb

## 2019-10-18 DIAGNOSIS — R9431 Abnormal electrocardiogram [ECG] [EKG]: Secondary | ICD-10-CM | POA: Insufficient documentation

## 2019-10-18 DIAGNOSIS — T502X5A Adverse effect of carbonic-anhydrase inhibitors, benzothiadiazides and other diuretics, initial encounter: Secondary | ICD-10-CM

## 2019-10-18 DIAGNOSIS — E118 Type 2 diabetes mellitus with unspecified complications: Secondary | ICD-10-CM

## 2019-10-18 DIAGNOSIS — L2084 Intrinsic (allergic) eczema: Secondary | ICD-10-CM

## 2019-10-18 DIAGNOSIS — I10 Essential (primary) hypertension: Secondary | ICD-10-CM

## 2019-10-18 DIAGNOSIS — N183 Chronic kidney disease, stage 3 unspecified: Secondary | ICD-10-CM

## 2019-10-18 DIAGNOSIS — E876 Hypokalemia: Secondary | ICD-10-CM

## 2019-10-18 LAB — MICROALBUMIN / CREATININE URINE RATIO
Creatinine,U: 98.7 mg/dL
Microalb Creat Ratio: 0.7 mg/g (ref 0.0–30.0)
Microalb, Ur: <0.7 mg/dL (ref 0.0–1.9)

## 2019-10-18 LAB — HEMOGLOBIN A1C: Hgb A1c MFr Bld: 6.3 % (ref 4.6–6.5)

## 2019-10-18 LAB — MAGNESIUM: Magnesium: 2.2 mg/dL (ref 1.5–2.5)

## 2019-10-18 LAB — BASIC METABOLIC PANEL
BUN: 25 mg/dL — ABNORMAL HIGH (ref 6–23)
CO2: 26 mEq/L (ref 19–32)
Calcium: 9.8 mg/dL (ref 8.4–10.5)
Chloride: 101 mEq/L (ref 96–112)
Creatinine, Ser: 1.37 mg/dL — ABNORMAL HIGH (ref 0.40–1.20)
GFR: 47.86 mL/min — ABNORMAL LOW (ref >60.00)
Glucose, Bld: 96 mg/dL (ref 70–99)
Potassium: 3.6 mEq/L (ref 3.5–5.1)
Sodium: 137 mEq/L (ref 135–145)

## 2019-10-18 NOTE — Patient Instructions (Signed)

## 2019-10-18 NOTE — Progress Notes (Signed)
Subjective:  Patient ID: [name], female    DOB: 15-Jul-1961  Age: 58 y.o. MRN: [id number]  CC: Hypertension and Rash   This visit occurred during the SARS-CoV-2 public health emergency.  Safety protocols were in place, including screening questions prior to the visit, additional usage of staff PPE, and extensive cleaning of exam room while observing appropriate contact time as indicated for disinfecting solutions.   HPI [name] presents for f/up - She returns for follow-up after recently being seen in the ED for evaluation of chest pain.  She was told that her EKG was abnormal and she needed to see a cardiologist.  She has had no more episodes of chest pain.  Her rash is much better.  She tells me the allergist told her she needed to see a dermatologist.  She tells me her blood pressure is well controlled and she denies headache, chest pain, shortness of breath, or edema.  Outpatient Medications Prior to Visit  Medication Sig Dispense Refill  . Accu-Chek FastClix Lancets MISC USE 1  TO CHECK GLUCOSE TWICE DAILY 102 each 3  . albuterol (PROVENTIL HFA;VENTOLIN HFA) 108 (90 Base) MCG/ACT inhaler Inhale 2 puffs into the lungs every 4 (four) hours as needed for wheezing or shortness of breath (cough, shortness of breath or wheezing.). 1 Inhaler 1  . aluminum chloride (DRYSOL) 20 % external solution Apply topically at bedtime. 60 mL 3  . blood glucose meter kit and supplies KIT Use to test blood sugar twice daily. DX: E11.8 1 each 0  . cyclobenzaprine (FLEXERIL) 10 MG tablet Take 1 tablet (10 mg total) by mouth 2 (two) times daily as needed for muscle spasms. 20 tablet 0  . glucose blood (ACCU-CHEK GUIDE) test strip 1 each by Other route 2 (two) times daily. Use as instructed 300 each 1  . levocetirizine (XYZAL) 5 MG tablet Take 1 tablet (5 mg total) by mouth every evening. 90 tablet 1  . mometasone-formoterol (DULERA) 100-5 MCG/ACT AERO Inhale 2 puffs into the lungs 2 (two) times  daily. (Patient taking differently: Inhale 2 puffs into the lungs 2 (two) times daily as needed for wheezing or shortness of breath. ) 13 g 11  . potassium chloride SA (KLOR-CON) 20 MEQ tablet Take 1 tablet (20 mEq total) by mouth 2 (two) times daily for 3 days. 6 tablet 0  . rosuvastatin (CRESTOR) 20 MG tablet Take 1 tablet (20 mg total) by mouth daily. 90 tablet 1  . traMADol (ULTRAM) 50 MG tablet Take 1 tablet (50 mg total) by mouth every 6 (six) hours as needed. 90 tablet 5  . triamcinolone cream (KENALOG) 0.5 % Apply 1 application topically 3 (three) times daily. 454 g 1  . triamterene-hydrochlorothiazide (MAXZIDE) 75-50 MG tablet Take 1 tablet by mouth daily. 90 tablet 0   No facility-administered medications prior to visit.    ROS Review of Systems  Constitutional: Negative.  Negative for diaphoresis, fatigue and unexpected weight change.  HENT: Negative.   Eyes: Negative for visual disturbance.  Respiratory: Negative for cough, chest tightness, shortness of breath and wheezing.   Cardiovascular: Negative for chest pain, palpitations and leg swelling.  Gastrointestinal: Negative for abdominal pain, diarrhea and nausea.  Endocrine: Negative.   Genitourinary: Negative.  Negative for difficulty urinating.  Musculoskeletal: Negative.  Negative for arthralgias and myalgias.  Skin: Positive for rash.  Neurological: Negative.  Negative for dizziness, syncope, weakness and headaches.  Hematological: Negative.   Psychiatric/Behavioral: Negative.  Objective:  BP 128/82 (BP Location: Left Arm, Patient Position: Sitting, Cuff Size: Large)   Pulse 77   Temp 98.1 F (36.7 C) (Oral)   Resp 16   Ht 5' 1"  (1.549 m)   Wt 226 lb (102.5 kg)   LMP 05/17/2012   SpO2 96%   BMI 42.70 kg/m   BP Readings from Last 3 Encounters:  10/18/19 128/82  10/17/19 (!) 152/82  08/27/19 130/80    Wt Readings from Last 3 Encounters:  10/18/19 226 lb (102.5 kg)  10/17/19 240 lb (108.9 kg)    08/27/19 220 lb (99.8 kg)    Physical Exam Vitals reviewed.  Constitutional:      Appearance: She is obese. She is not ill-appearing.  HENT:     Nose: Nose normal.     Mouth/Throat:     Mouth: Mucous membranes are moist.  Eyes:     General: No scleral icterus.    Conjunctiva/sclera: Conjunctivae normal.  Cardiovascular:     Rate and Rhythm: Normal rate and regular rhythm.     Heart sounds: No murmur.  Pulmonary:     Effort: Pulmonary effort is normal.     Breath sounds: No stridor. No wheezing, rhonchi or rales.  Abdominal:     General: Abdomen is protuberant. There is no distension.     Palpations: There is no hepatomegaly, splenomegaly or mass.     Tenderness: There is no abdominal tenderness.  Musculoskeletal:        General: Normal range of motion.     Cervical back: Neck supple.     Right lower leg: No edema.     Left lower leg: No edema.  Lymphadenopathy:     Cervical: No cervical adenopathy.  Skin:    General: Skin is warm and dry.     Findings: Rash present.     Comments: There are densely hyperpigmented, dark black macules on the extensor surfaces of her upper and lower extremities.  Neurological:     General: No focal deficit present.     Mental Status: She is alert.  Psychiatric:        Mood and Affect: Mood normal.        Behavior: Behavior normal.     Lab Results  Component Value Date   WBC 5.4 10/17/2019   HGB 13.4 10/17/2019   HCT 42.8 10/17/2019   PLT 260 10/17/2019   GLUCOSE 96 10/18/2019   CHOL 197 07/11/2019   TRIG 113.0 07/11/2019   HDL 50.90 07/11/2019   LDLCALC 124 (H) 07/11/2019   ALT 25 08/09/2019   AST 26 08/09/2019   NA 137 10/18/2019   K 3.6 10/18/2019   CL 101 10/18/2019   CREATININE 1.37 (H) 10/18/2019   BUN 25 (H) 10/18/2019   CO2 26 10/18/2019   TSH 2.80 07/11/2019   HGBA1C 6.3 10/18/2019   MICROALBUR <0.7 10/18/2019    DG Chest 2 View  Result Date: 10/17/2019 CLINICAL DATA:  Chest pain EXAM: CHEST - 2 VIEW  COMPARISON:  02/01/2018 FINDINGS: The heart size and mediastinal contours are within normal limits. Both lungs are clear. The visualized skeletal structures are unremarkable. IMPRESSION: No acute cardiopulmonary disease. Electronically Signed   By: Zetta Bills M.D.   On: 10/17/2019 09:55    Assessment & Plan:   [name] was seen today for hypertension and rash.  Diagnoses and all orders for this visit:  Prolonged Q-T interval on ECG -     Magnesium; Future -  Basic metabolic panel; Future -     Ambulatory referral to Cardiology  Stage 3 chronic kidney disease, unspecified whether stage 3a or 3b CKD- Her renal function is stable.  She agrees to avoid nephrotoxic agents.  Will continue to maintain control of her blood pressure and her blood sugars. -     Basic metabolic panel; Future  Diuretic-induced hypokalemia -     Magnesium; Future -     Basic metabolic panel; Future  Intrinsic eczema -     Ambulatory referral to Dermatology  Type II diabetes mellitus with manifestations ([name])- Her blood sugars are adequately well controlled. -     Microalbumin / creatinine urine ratio; Future -     Hemoglobin A1c; Future  Essential hypertension- Her blood pressure is well controlled.  Electrolytes are normal.   I am having [name] maintain her aluminum chloride, albuterol, mometasone-formoterol, levocetirizine, triamcinolone cream, rosuvastatin, blood glucose meter kit and supplies, Accu-Chek Guide, cyclobenzaprine, triamterene-hydrochlorothiazide, Accu-Chek FastClix Lancets, traMADol, and potassium chloride SA.  No orders of the defined types were placed in this encounter.    Follow-up: Return in about 6 months (around 04/17/2020).  [name], MD

## 2019-10-19 ENCOUNTER — Encounter: Payer: Self-pay | Admitting: Internal Medicine

## 2019-10-20 ENCOUNTER — Other Ambulatory Visit: Payer: Self-pay | Admitting: Family Medicine

## 2019-10-20 MED ORDER — POTASSIUM CHLORIDE CRYS ER 10 MEQ PO TBCR: EXTENDED_RELEASE_TABLET | ORAL | 0 refills | Status: DC

## 2019-10-20 NOTE — Progress Notes (Signed)
Call from on-call service.  This patient was seen in the emergency room on 12/9, she had chest pain and hypokalemia.  Prescription for potassium by the ER staff She then saw her PCP, Dr. Ronnald Ramp, on 1210.  At that time her potassium was low normal, 3.6. Per patient, the plan was to call in additional potassium for her to continue taking for a while.  However there is no prescription for her and she is upset.  I am not certain if the plan was to continue potassium.  I will call in potassium at 10 mEq once a day, this low dose should be safe for her to take for a few days.  Will route to Dr. Ronnald Ramp for further clarification  Konrad Dolores, can you please look at this file.  I am not sure if you want her to continue taking potassium for a longer period.  I called in 10 mEq for 10 days once a day

## 2019-10-21 ENCOUNTER — Other Ambulatory Visit: Payer: Self-pay | Admitting: Internal Medicine

## 2019-10-21 DIAGNOSIS — T502X5A Adverse effect of carbonic-anhydrase inhibitors, benzothiadiazides and other diuretics, initial encounter: Secondary | ICD-10-CM

## 2019-10-21 DIAGNOSIS — E876 Hypokalemia: Secondary | ICD-10-CM

## 2019-10-21 MED ORDER — POTASSIUM CHLORIDE CRYS ER 10 MEQ PO TBCR: EXTENDED_RELEASE_TABLET | ORAL | 1 refills | Status: DC

## 2019-10-22 ENCOUNTER — Other Ambulatory Visit: Payer: Self-pay

## 2019-10-22 ENCOUNTER — Ambulatory Visit (INDEPENDENT_AMBULATORY_CARE_PROVIDER_SITE_OTHER): Payer: Medicare Other | Admitting: Cardiology

## 2019-10-22 ENCOUNTER — Encounter: Payer: Self-pay | Admitting: Cardiology

## 2019-10-22 VITALS — BP 118/70 | HR 87 | Ht 61.0 in | Wt 226.0 lb

## 2019-10-22 DIAGNOSIS — E7800 Pure hypercholesterolemia, unspecified: Secondary | ICD-10-CM | POA: Diagnosis not present

## 2019-10-22 DIAGNOSIS — I10 Essential (primary) hypertension: Secondary | ICD-10-CM | POA: Diagnosis not present

## 2019-10-22 DIAGNOSIS — R072 Precordial pain: Secondary | ICD-10-CM | POA: Diagnosis not present

## 2019-10-22 DIAGNOSIS — E1169 Type 2 diabetes mellitus with other specified complication: Secondary | ICD-10-CM

## 2019-10-22 DIAGNOSIS — Z01812 Encounter for preprocedural laboratory examination: Secondary | ICD-10-CM

## 2019-10-22 DIAGNOSIS — R9431 Abnormal electrocardiogram [ECG] [EKG]: Secondary | ICD-10-CM | POA: Diagnosis not present

## 2019-10-22 DIAGNOSIS — Z7189 Other specified counseling: Secondary | ICD-10-CM

## 2019-10-22 DIAGNOSIS — E66813 Obesity, class 3: Secondary | ICD-10-CM

## 2019-10-22 DIAGNOSIS — E669 Obesity, unspecified: Secondary | ICD-10-CM

## 2019-10-22 DIAGNOSIS — Z6841 Body Mass Index (BMI) 40.0 and over, adult: Secondary | ICD-10-CM

## 2019-10-22 MED ORDER — METOPROLOL TARTRATE 100 MG PO TABS: ORAL_TABLET | ORAL | 0 refills | Status: DC

## 2019-10-22 NOTE — Progress Notes (Signed)
Cardiology Office Note:    Date:  10/22/2019   ID:  [name], DOB 01-10-1961, MRN [id number]  PCP:  [name], MD  Cardiologist:  Buford Dresser, MD  Referring MD: [name], MD   CC: new patient evaluation for long QT and chest pain  History of Present Illness:    [name] is a 58 y.o. female with a hx of hypertension, type II diabetes, stage 3 CKD, hyperlipidemia, obesity who is seen as a new consult at the request of [name], MD for the evaluation and management of abnormal ECG. She also endorses recent chest pain.  On ECG from 10/17/19, auto-calculated QT is 525 with a QTc of 534. However on manual calculation, this QT based on lead I (where a clear T wave can be seen) is actually 380 with QTc of 386.  She is not sure why she was referred. We reviewed recent ER visit and ECG.  She is no longer having chest pain. Only episode she ever had was when she went to ER. Started when she woke up in the morning, was right sided "squishing" sensation, mild, not tender. Worse with walking. No radiation, only right sided over the breast. Better with sitting down/lying down. Not short of breath.   Cardiovascular risk factors: Prior clinical ASCVD:  none Comorbid conditions: hypertension, hyperlipidemia, diabetes not on insulin, chronic kidney disease (nephrologist at [facility], cannot remember name) Metabolic syndrome/Obesity: BMI 42, highest adult weight 240 lbs. Chronic inflammatory conditions: none (has OA) Tobacco use history: never Family history: mother (deceased) had pacemaker, oldest daughter had hole in her heart when she was born, sister had hole in her heart when she was born. Daughter several years ago had a heart attack in her 77s. Prior cardiac testing and/or incidental findings on other testing (ie coronary calcium): lexiscan 2019, echo stress 2013 Exercise level: when it was warmer, walked about 30 minutes at a regular pace, until her  arthritis bothers her Current diet: has changed a lot since being diagnosed with diabetes. Rare fried food, eats fish. Cooks her own food at home. Loves fruit and vegetables.   Past Medical History:  Diagnosis Date  . Allergy    rhinitis  . Anemia   . Arthritis   . Asthma   . Diabetes mellitus without complication ([place])   . Headache(784.0)   . History of stomach ulcers   . Hypertension   . Internal hemorrhoids     Past Surgical History:  Procedure Laterality Date  . CARPAL TUNNEL RELEASE Right   . TUBAL LIGATION      Current Medications: Current Outpatient Medications on File Prior to Visit  Medication Sig  . Accu-Chek FastClix Lancets MISC USE 1  TO CHECK GLUCOSE TWICE DAILY  . albuterol (PROVENTIL HFA;VENTOLIN HFA) 108 (90 Base) MCG/ACT inhaler Inhale 2 puffs into the lungs every 4 (four) hours as needed for wheezing or shortness of breath (cough, shortness of breath or wheezing.).  Marland Kitchen aluminum chloride (DRYSOL) 20 % external solution Apply topically at bedtime.  . blood glucose meter kit and supplies KIT Use to test blood sugar twice daily. DX: E11.8  . cyclobenzaprine (FLEXERIL) 10 MG tablet Take 1 tablet (10 mg total) by mouth 2 (two) times daily as needed for muscle spasms.  Marland Kitchen glucose blood (ACCU-CHEK GUIDE) test strip 1 each by Other route 2 (two) times daily. Use as instructed  . levocetirizine (XYZAL) 5 MG tablet Take 1 tablet (5 mg total) by  mouth every evening.  . mometasone-formoterol (DULERA) 100-5 MCG/ACT AERO Inhale 2 puffs into the lungs 2 (two) times daily. (Patient taking differently: Inhale 2 puffs into the lungs 2 (two) times daily as needed for wheezing or shortness of breath. )  . potassium chloride (KLOR-CON) 10 MEQ tablet Take one daily as directed by MD  . rosuvastatin (CRESTOR) 20 MG tablet Take 1 tablet (20 mg total) by mouth daily.  . traMADol (ULTRAM) 50 MG tablet Take 1 tablet (50 mg total) by mouth every 6 (six) hours as needed.  . triamcinolone  cream (KENALOG) 0.5 % Apply 1 application topically 3 (three) times daily.  Marland Kitchen triamterene-hydrochlorothiazide (MAXZIDE) 75-50 MG tablet Take 1 tablet by mouth daily.   No current facility-administered medications on file prior to visit.     Allergies:   Verapamil   Social History   Tobacco Use  . Smoking status: Passive Smoke Exposure - Never Smoker  . Smokeless tobacco: Never Used  . Tobacco comment: husband smokes outside and inside the home  Substance Use Topics  . Alcohol use: No    Alcohol/week: 0.0 standard drinks  . Drug use: No    Family History: family history includes Arthritis in an other family member; Breast cancer in her daughter and another family member; Diabetes in her sister and another family member; Hypertension in her mother, sister, and another family member. There is no history of Cancer, Early death, Hearing loss, Heart disease, Hyperlipidemia, Kidney disease, Stroke, or Colon cancer.  ROS:   Please see the history of present illness.  Additional pertinent ROS: Constitutional: Negative for chills, fever, night sweats, unintentional weight loss  HENT: Negative for ear pain and hearing loss.   Eyes: Negative for loss of vision and eye pain.  Respiratory: Negative for cough, sputum, wheezing.   Cardiovascular: See HPI. Gastrointestinal: Negative for abdominal pain, melena, and hematochezia.  Genitourinary: Negative for dysuria and hematuria.  Musculoskeletal: Negative for falls and myalgias.  Skin: Negative for itching and rash.  Neurological: Negative for focal weakness, focal sensory changes and loss of consciousness.  Endo/Heme/Allergies: Does not bruise/bleed easily.     EKGs/Labs/Other Studies Reviewed:    The following studies were reviewed today: Lexiscan 09/26/2018  The left ventricular ejection fraction is normal (55-65%).  Nuclear stress EF: 65%.  There was no ST segment deviation noted during stress.  The study is normal.   1. EF 65%,  normal wall motion.  2. No significant perfusion defect, normal study.   Echo stress 06/27/12 - Stress ECG conclusions: There were no stress arrhythmias  or conduction abnormalities. The stress ECG was negative  for ischemia.  - Staged echo: There was no echocardiographic evidence for  stress-induced ischemia.  - Impressions: Baseline ECG appears to show LVH with  secondary ST/T wave changes   EKG:  EKG is personally reviewed.  The ekg ordered today demonstrates NSR, QTc 406, nonspecific ST changes  Recent Labs: 07/11/2019: TSH 2.80 08/09/2019: ALT 25 10/17/2019: Hemoglobin 13.4; Platelets 260 10/18/2019: BUN 25; Creatinine, Ser 1.37; Magnesium 2.2; Potassium 3.6; Sodium 137  Recent Lipid Panel    Component Value Date/Time   CHOL 197 07/11/2019 0904   TRIG 113.0 07/11/2019 0904   HDL 50.90 07/11/2019 0904   CHOLHDL 4 07/11/2019 0904   VLDL 22.6 07/11/2019 0904   LDLCALC 124 (H) 07/11/2019 0904    Physical Exam:    VS:  BP 118/70   Pulse 87   Ht 5' 1" (1.549 m)   Wt 226  lb (102.5 kg)   LMP 05/17/2012   SpO2 98%   BMI 42.70 kg/m     Wt Readings from Last 3 Encounters:  10/22/19 226 lb (102.5 kg)  10/18/19 226 lb (102.5 kg)  10/17/19 240 lb (108.9 kg)    GEN: Well nourished, well developed in no acute distress HEENT: Normal, moist mucous membranes NECK: No JVD CARDIAC: regular rhythm, normal S1 and S2, no rubs or gallops. No murmurs. VASCULAR: Radial and DP pulses 2+ bilaterally. No carotid bruits RESPIRATORY:  Clear to auscultation without rales, wheezing or rhonchi  ABDOMEN: Soft, non-tender, non-distended MUSCULOSKELETAL:  Ambulates independently SKIN: Warm and dry, no edema NEUROLOGIC:  Alert and oriented x 3. No focal neuro deficits noted. PSYCHIATRIC:  Normal affect    ASSESSMENT:    1. Precordial pain   2. Pre-procedure lab exam   3. Abnormal ECG   4. Essential hypertension   5. Pure hypercholesterolemia   6. Type 2 diabetes mellitus with  obesity (HCC)   7. Class 3 severe obesity due to excess calories with serious comorbidity and body mass index (BMI) of 40.0 to 44.9 in adult ([place])   8. Cardiac risk counseling   9. Counseling on health promotion and disease prevention    PLAN:    Chest pain: has not recurred, but significant CV risk factors including hypertension, hyperlipidemia, type II diabetes, chronic kidney disease, and obesity We spent significant time today reviewing different parts of the cardiovascular system (electrical, vascular, functional, and valvular). We discussed how each of these systems can present with different symptoms. We reviewed that there are different ways we evaluate these symptoms with tests. We reviewed which tests I think are most appropriate given the symptoms, and we discussed risks/benefits and limitations of each of these tests. Please see summary below. We also discussed that if testing is unrevealing for a cardiac cause of the symptoms, there are many noncardiac causes as well that can contribute to symptoms. If the heart is ruled out, then I recommend returning to PCP to discuss alternative diagnoses. -discussed treadmill stress, nuclear stress/lexiscan, and CT coronary angiography. Discussed pros and cons of each, including but not limited to false positive/false negative risk, radiation risk, and risk of IV contrast dye. Based on shared decision making, decision was made to pursue CT coronary angiography. -will give one time dose of metoprolol 2 hours prior to scheduled test -counseled on need to get BMET prior to test -counseled on use of sublingual nitroglycerin and its importance to a good test  Abnormal ECG, reported long QT: QT is normal on today's ECG. ECG has nonspecific changes only.  Hypertension: at goal of <130/80 today -continue triamterene-HCTZ. Defer ACEi/ARB to her nephrologist  Hypercholesterolemia: last LDL 124. Goal TBD based on imaging, at least <100 but <70 if any  evidence of CAD -continue rosuvastatin 20 mg  Class 3 obesity, BMI 42, with serious comorbidity: she is down from her peak weight. Encouraged her to continue working on weight loss to improve her CV risk profile.  Type II diabetes: if CAD seen on imaging, would consider SGLT2i or GLP1RA. Discussed aspirin 81 mg, will consider based on results  Cardiac risk counseling and prevention recommendations: -recommend heart healthy/Mediterranean diet, with whole grains, fruits, vegetable, fish, lean meats, nuts, and olive oil. Limit salt. -recommend moderate walking, 3-5 times/week for 30-50 minutes each session. Aim for at least 150 minutes.week. Goal should be pace of 3 miles/hours, or walking 1.5 miles in 30 minutes -recommend avoidance of tobacco  products. Avoid excess alcohol. -ASCVD risk score: The 10-year ASCVD risk score Mikey Bussing DC Brooke Bonito., et al., 2013) is: 11.3%   Values used to calculate the score:     Age: 83 years     Sex: Female     Is Non-Hispanic African American: Yes     Diabetic: Yes     Tobacco smoker: No     Systolic Blood Pressure: 468 mmHg     Is BP treated: Yes     HDL Cholesterol: 50.9 mg/dL     Total Cholesterol: 197 mg/dL    Plan for follow up: if CT unremarkable, should have annual visit. If there are concerning findings on imaging, will see her back to discuss next steps  Medication Adjustments/Labs and Tests Ordered: Current medicines are reviewed at length with the patient today.  Concerns regarding medicines are outlined above.  Orders Placed This Encounter  Procedures  . CT CORONARY MORPH W/CTA COR W/SCORE W/CA W/CM &/OR WO/CM  . CT CORONARY FRACTIONAL FLOW RESERVE DATA PREP  . CT CORONARY FRACTIONAL FLOW RESERVE FLUID ANALYSIS  . Basic metabolic panel  . EKG 12-Lead   Meds ordered this encounter  Medications  . metoprolol tartrate (LOPRESSOR) 100 MG tablet    Sig: TAKE 1 TABLET 2 HR PRIOR TO CARDIAC PROCEDURE    Dispense:  1 tablet    Refill:  0     Patient Instructions  Medication Instructions:  Your Physician recommend you continue on your current medication as directed.    *If you need a refill on your cardiac medications before your next appointment, please call your pharmacy*  Lab Work: Your physician recommends that you return for lab work 1 week prior to procedure (BMP).  If you have labs (blood work) drawn today and your tests are completely normal, you will receive your results only by: Marland Kitchen MyChart Message (if you have MyChart) OR . A paper copy in the mail If you have any lab test that is abnormal or we need to change your treatment, we will call you to review the results.  Testing/Procedures: CT Angiography (CTA), is a special type of CT scan that uses a computer to produce multi-dimensional views of major blood vessels throughout the body. In CT angiography, a contrast material is injected through an IV to help visualize the blood vessels     Follow-Up: At [facility], you and your health needs are our priority.  As part of our continuing mission to provide you with exceptional heart care, we have created designated Provider Care Teams.  These Care Teams include your primary Cardiologist (physician) and Advanced Practice Providers (APPs -  Physician Assistants and Nurse Practitioners) who all work together to provide you with the care you need, when you need it.  Your next appointment:   1 year(s)  The format for your next appointment:   In Person  Provider:   Buford Dresser, MD     Your cardiac CT will be scheduled at one of the below locations:   [facility] [address] [place], [place] [address] [phone]  If scheduled at [facility], please arrive at the [facility] main entrance of [facility] 30-45 minutes prior to test start time. Proceed to the [facility] Radiology Department (first floor) to check-in and test prep.  If scheduled at [facility], please arrive 15 mins early for check-in and test prep.  Please follow these instructions carefully (unless otherwise directed):  On  the Night Before the Test: . Be sure to Drink plenty of water. . Do not consume any caffeinated/decaffeinated beverages or chocolate 12 hours prior to your test. . Do not take any antihistamines 12 hours prior to your test.   On the Day of the Test: . Drink plenty of water. Do not drink any water within one hour of the test. . Do not eat any food 4 hours prior to the test. . You may take your regular medications prior to the test.  . Take metoprolol (Lopressor) two hours prior to test. . HOLD Furosemide/Hydrochlorothiazide morning of the test. . FEMALES- please wear underwire-free bra if available       After the Test: . Drink plenty of water. . After receiving IV contrast, you may experience a mild flushed feeling. This is normal. . On occasion, you may experience a mild rash up to 24 hours after the test. This is not dangerous. If this occurs, you can take Benadryl 25 mg and increase your fluid intake. . If you experience trouble breathing, this can be serious. If it is severe call 911 IMMEDIATELY. If it is mild, please call our office. . If you take any of these medications: Glipizide/Metformin, Avandament, Glucavance, please do not take 48 hours after completing test unless otherwise instructed.   Once we have confirmed authorization from your insurance company, we will call you to set up a date and time for your test.   For non-scheduling related questions, please contact the cardiac imaging nurse navigator should you have any questions/concerns: Marchia Bond, RN Navigator Cardiac Imaging [facility] [phone] Office      Signed, Buford Dresser, MD PhD 10/22/2019 4:09 PM    [place]

## 2019-10-22 NOTE — Patient Instructions (Addendum)
Medication Instructions:  Your Physician recommend you continue on your current medication as directed.    *If you need a refill on your cardiac medications before your next appointment, please call your pharmacy*  Lab Work: Your physician recommends that you return for lab work 1 week prior to procedure (BMP).  If you have labs (blood work) drawn today and your tests are completely normal, you will receive your results only by: Marland Kitchen MyChart Message (if you have MyChart) OR . A paper copy in the mail If you have any lab test that is abnormal or we need to change your treatment, we will call you to review the results.  Testing/Procedures: CT Angiography (CTA), is a special type of CT scan that uses a computer to produce multi-dimensional views of major blood vessels throughout the body. In CT angiography, a contrast material is injected through an IV to help visualize the blood vessels     Follow-Up: At [facility], you and your health needs are our priority.  As part of our continuing mission to provide you with exceptional heart care, we have created designated Provider Care Teams.  These Care Teams include your primary Cardiologist (physician) and Advanced Practice Providers (APPs -  Physician Assistants and Nurse Practitioners) who all work together to provide you with the care you need, when you need it.  Your next appointment:   1 year(s)  The format for your next appointment:   In Person  Provider:   Buford Dresser, MD     Your cardiac CT will be scheduled at one of the below locations:   [facility] [address] [place],  [address] [phone]  If scheduled at [facility], please arrive at the [facility] main entrance of [facility] 30-45 minutes prior to test start time. Proceed to the [facility] Department (first floor) to check-in and test prep.  If scheduled at [facility],  please arrive 15 mins early for check-in and test prep.  Please follow these instructions carefully (unless otherwise directed):  On the Night Before the Test: . Be sure to Drink plenty of water. . Do not consume any caffeinated/decaffeinated beverages or chocolate 12 hours prior to your test. . Do not take any antihistamines 12 hours prior to your test.   On the Day of the Test: . Drink plenty of water. Do not drink any water within one hour of the test. . Do not eat any food 4 hours prior to the test. . You may take your regular medications prior to the test.  . Take metoprolol (Lopressor) two hours prior to test. . HOLD Furosemide/Hydrochlorothiazide morning of the test. . FEMALES- please wear underwire-free bra if available       After the Test: . Drink plenty of water. . After receiving IV contrast, you may experience a mild flushed feeling. This is normal. . On occasion, you may experience a mild rash up to 24 hours after the test. This is not dangerous. If this occurs, you can take Benadryl 25 mg and increase your fluid intake. . If you experience trouble breathing, this can be serious. If it is severe call 911 IMMEDIATELY. If it is mild, please call our office. . If you take any of these medications: Glipizide/Metformin, Avandament, Glucavance, please do not take 48 hours after completing test unless otherwise instructed.   Once we have confirmed authorization from your insurance company, we will call you to set up a date  and time for your test.   For non-scheduling related questions, please contact the cardiac imaging nurse navigator should you have any questions/concerns: Marchia Bond, RN Navigator Cardiac Imaging [facility] [phone] Office

## 2019-10-24 ENCOUNTER — Encounter: Payer: Self-pay | Admitting: Cardiology

## 2019-11-01 ENCOUNTER — Encounter: Payer: Self-pay | Admitting: Cardiology

## 2019-11-01 DIAGNOSIS — E7800 Pure hypercholesterolemia, unspecified: Secondary | ICD-10-CM | POA: Insufficient documentation

## 2019-11-27 ENCOUNTER — Encounter: Payer: Self-pay | Admitting: Internal Medicine

## 2019-11-29 ENCOUNTER — Telehealth: Payer: Self-pay | Admitting: Internal Medicine

## 2019-11-29 DIAGNOSIS — E118 Type 2 diabetes mellitus with unspecified complications: Secondary | ICD-10-CM

## 2019-11-29 MED ORDER — ACCU-CHEK GUIDE VI STRP: ORAL_STRIP | 1 refills | Status: AC

## 2019-11-29 MED ORDER — ACCU-CHEK FASTCLIX LANCETS MISC: 3 refills | Status: AC

## 2019-11-29 NOTE — Telephone Encounter (Signed)
Erx has been sent in.  

## 2019-11-29 NOTE — Telephone Encounter (Signed)
Patient is requesting a refill on the following : glucose blood (ACCU-CHEK GUIDE) test strip Accu-Chek [facility] Lancets [place] ([place]), [place] - [address] Phone:  [phone]  Fax:  [phone]

## 2019-12-07 ENCOUNTER — Telehealth (HOSPITAL_COMMUNITY): Payer: Self-pay | Admitting: Emergency Medicine

## 2019-12-10 ENCOUNTER — Other Ambulatory Visit: Payer: Self-pay | Admitting: Internal Medicine

## 2019-12-10 ENCOUNTER — Ambulatory Visit (HOSPITAL_COMMUNITY): Payer: Medicare Other

## 2019-12-10 DIAGNOSIS — I10 Essential (primary) hypertension: Secondary | ICD-10-CM

## 2019-12-10 DIAGNOSIS — E876 Hypokalemia: Secondary | ICD-10-CM

## 2019-12-10 DIAGNOSIS — T50905A Adverse effect of unspecified drugs, medicaments and biological substances, initial encounter: Secondary | ICD-10-CM

## 2019-12-10 MED ORDER — TRIAMTERENE-HCTZ 75-50 MG PO TABS: 1.0000 | ORAL_TABLET | Freq: Every day | ORAL | 0 refills | Status: DC

## 2019-12-10 NOTE — Telephone Encounter (Signed)
        1. Which medications need to be refilled? (please list name of each medication and dose if known) triamterene-hydrochlorothiazide (MAXZIDE) 75-50 MG tablet  2. Which pharmacy/location (including street and city if local pharmacy) is medication to be sent to? [place] ([place]), [place] - [address]  3. Do they need a 30 day or 90 day supply? [place]

## 2020-01-01 DIAGNOSIS — L2089 Other atopic dermatitis: Secondary | ICD-10-CM | POA: Diagnosis not present

## 2020-01-07 ENCOUNTER — Telehealth (HOSPITAL_COMMUNITY): Payer: Self-pay | Admitting: Emergency Medicine

## 2020-01-07 NOTE — Telephone Encounter (Signed)
Reaching out to patient to offer assistance regarding upcoming cardiac imaging study; pt verbalizes understanding of appt date/time, parking situation and where to check in, pre-test NPO status and medications ordered, and verified current allergies; name and call back number provided for further questions should they arise Omauri Boeve RN Navigator Cardiac Imaging [place] [facility] [phone] office [phone] cell 

## 2020-01-08 ENCOUNTER — Ambulatory Visit (HOSPITAL_COMMUNITY)
Admission: RE | Admit: 2020-01-08 | Discharge: 2020-01-08 | Disposition: A | Discharge status: Home / Self Care | Payer: Medicare Other | Source: Ambulatory Visit | Attending: Cardiology | Admitting: Cardiology

## 2020-01-08 ENCOUNTER — Telehealth: Payer: Self-pay | Admitting: Internal Medicine

## 2020-01-08 ENCOUNTER — Other Ambulatory Visit: Payer: Self-pay

## 2020-01-08 DIAGNOSIS — R072 Precordial pain: Secondary | ICD-10-CM | POA: Diagnosis not present

## 2020-01-08 LAB — POCT I-STAT CREATININE: Creatinine, Ser: 1.5 mg/dL — ABNORMAL HIGH (ref 0.44–1.00)

## 2020-01-08 MED ORDER — NITROGLYCERIN 0.4 MG SL SUBL
0.8000 mg | SUBLINGUAL_TABLET | SUBLINGUAL | Status: DC | PRN
  Administered 2020-01-08: 0.8 mg via SUBLINGUAL

## 2020-01-08 MED ORDER — IOHEXOL 350 MG/ML SOLN
80.0000 mL | Freq: Once | INTRAVENOUS | Status: AC | PRN
  Administered 2020-01-08: 80 mL via INTRAVENOUS

## 2020-01-08 MED ORDER — NITROGLYCERIN 0.4 MG SL SUBL
SUBLINGUAL_TABLET | SUBLINGUAL | Status: AC
  Filled 2020-01-08: qty 2

## 2020-01-08 NOTE — Telephone Encounter (Signed)
New message:    Patient is calling and would like to know if it is okay for her to get the vaccination due to her underlying health issues. Please advise.

## 2020-01-08 NOTE — Telephone Encounter (Signed)
I can add her to the wait list if you feel she should be considered for vaccination earlier.

## 2020-01-14 ENCOUNTER — Telehealth: Payer: Self-pay | Admitting: Internal Medicine

## 2020-01-14 DIAGNOSIS — E785 Hyperlipidemia, unspecified: Secondary | ICD-10-CM

## 2020-01-14 MED ORDER — ROSUVASTATIN CALCIUM 20 MG PO TABS: 20.0000 mg | ORAL_TABLET | Freq: Every day | ORAL | 1 refills | Status: DC

## 2020-01-14 NOTE — Telephone Encounter (Signed)
    1.Medication Requested: rosuvastatin (CRESTOR) 20 MG tablet  2. Pharmacy (Name, Street, [place]):[place] ([place]), [place] - [address]  3. On Med List: yes  4. Last Visit with PCP: 10/18/19  5. Next visit date with PCP: n/a   Agent: Please be advised that RX refills may take up to 3 business days. We ask that you follow-up with your pharmacy.

## 2020-01-14 NOTE — Telephone Encounter (Signed)
erx has been sent.  

## 2020-02-05 ENCOUNTER — Telehealth: Payer: Self-pay

## 2020-02-05 DIAGNOSIS — L2084 Intrinsic (allergic) eczema: Secondary | ICD-10-CM

## 2020-02-05 MED ORDER — LEVOCETIRIZINE DIHYDROCHLORIDE 5 MG PO TABS: 5.0000 mg | ORAL_TABLET | Freq: Every evening | ORAL | 1 refills | Status: DC

## 2020-02-05 NOTE — Telephone Encounter (Signed)
erx sent as requested.  

## 2020-02-05 NOTE — Telephone Encounter (Signed)
1.Medication Requested:levocetirizine (XYZAL) 5 MG tablet  2. Pharmacy (Name, Street, [place]):[place] ([place]), [place] - [address]  3. On Med List: Yes   4. Last Visit with PCP: 12.10.20   5. Next visit date with PCP: no appt made at this time    Agent: Please be advised that RX refills may take up to 3 business days. We ask that you follow-up with your pharmacy.

## 2020-02-12 ENCOUNTER — Other Ambulatory Visit: Payer: Self-pay | Admitting: Internal Medicine

## 2020-02-12 DIAGNOSIS — Z1231 Encounter for screening mammogram for malignant neoplasm of breast: Secondary | ICD-10-CM

## 2020-03-19 ENCOUNTER — Telehealth: Payer: Self-pay | Admitting: Internal Medicine

## 2020-03-19 DIAGNOSIS — I10 Essential (primary) hypertension: Secondary | ICD-10-CM

## 2020-03-19 DIAGNOSIS — T50905A Adverse effect of unspecified drugs, medicaments and biological substances, initial encounter: Secondary | ICD-10-CM

## 2020-03-19 MED ORDER — TRIAMTERENE-HCTZ 75-50 MG PO TABS: 1.0000 | ORAL_TABLET | Freq: Every day | ORAL | 1 refills | Status: DC

## 2020-03-19 NOTE — Telephone Encounter (Signed)
New message:   1.Medication Requested: triamterene-hydrochlorothiazide (MAXZIDE) 75-50 MG tablet Tramadol 50 mg 2. Pharmacy (Name, Street, [place]): [place] ([place]),  - [address] 3. On Med List: yes/No  4. Last Visit with PCP: 10/18/19  5. Next visit date with PCP: None   Agent: Please be advised that RX refills may take up to 3 business days. We ask that you follow-up with your pharmacy.

## 2020-03-19 NOTE — Telephone Encounter (Signed)
erx sent for 30 and 1.   Pt is due for follow up with PCP in June.

## 2020-03-20 ENCOUNTER — Telehealth: Payer: Self-pay | Admitting: Internal Medicine

## 2020-03-20 DIAGNOSIS — I10 Essential (primary) hypertension: Secondary | ICD-10-CM

## 2020-03-20 NOTE — Progress Notes (Signed)
  Chronic Care Management   Note  03/20/2020 Name: [name] MRN: [id number] DOB: [birth year]  Crystal Dry Hoo is a 59 y.o. year old female who is a primary care patient of [name], MD. I reached out to [name] by phone today in response to a referral sent by Ms. [name]'s PCP, [name], MD.   [name] was given information about Chronic Care Management services today including:  1. CCM service includes personalized support from designated clinical staff supervised by her physician, including individualized plan of care and coordination with other care providers 2. 24/7 contact phone numbers for assistance for urgent and routine care needs. 3. Service will only be billed when office clinical staff spend 20 minutes or more in a month to coordinate care. 4. Only one practitioner may furnish and bill the service in a calendar month. 5. The patient may stop CCM services at any time (effective at the end of the month) by phone call to the office staff.   Patient agreed to services and verbal consent obtained.  This note is not being shared with the patient for the following reason: To respect privacy (The patient or proxy has requested that the information not be shared). Follow up plan:   Earney [place] Upstream Scheduler

## 2020-03-20 NOTE — Telephone Encounter (Signed)
New Message:   Pt is calling back with questions as of to why her BP medication was filled but not her Tramadol. Please call pt.

## 2020-03-21 ENCOUNTER — Other Ambulatory Visit: Payer: Self-pay | Admitting: Internal Medicine

## 2020-03-21 DIAGNOSIS — M797 Fibromyalgia: Secondary | ICD-10-CM

## 2020-03-21 DIAGNOSIS — M4316 Spondylolisthesis, lumbar region: Secondary | ICD-10-CM

## 2020-03-21 DIAGNOSIS — M47816 Spondylosis without myelopathy or radiculopathy, lumbar region: Secondary | ICD-10-CM

## 2020-03-21 MED ORDER — TRAMADOL HCL 50 MG PO TABS: 50.0000 mg | ORAL_TABLET | Freq: Four times a day (QID) | ORAL | 3 refills | Status: DC | PRN

## 2020-03-21 NOTE — Telephone Encounter (Signed)
New message:   Pt is calling and states she was told Tramadol would be sent to the pharmacy today. She states she was told to call the pharmacy and they would have it. Pt states she needs her medication because she is in a lot of pain with her back. Please advise.

## 2020-03-21 NOTE — Telephone Encounter (Signed)
Pt is requesting refill of tramadol.  Last office visit - 10/18/2019 Next visit scheduled for 04/17/2020   Informed pt that I have sent refill request to be addressed by PCP.

## 2020-03-24 NOTE — Telephone Encounter (Signed)
erx was sent in on Friday 03/21/2020 after close of office by PCP.

## 2020-03-26 ENCOUNTER — Ambulatory Visit
Admission: RE | Admit: 2020-03-26 | Discharge: 2020-03-26 | Disposition: A | Discharge status: Home / Self Care | Payer: Medicare Other | Source: Ambulatory Visit | Attending: Internal Medicine | Admitting: Internal Medicine

## 2020-03-26 ENCOUNTER — Other Ambulatory Visit: Payer: Self-pay

## 2020-03-26 DIAGNOSIS — Z1231 Encounter for screening mammogram for malignant neoplasm of breast: Secondary | ICD-10-CM | POA: Diagnosis not present

## 2020-03-27 LAB — HM MAMMOGRAPHY

## 2020-04-17 ENCOUNTER — Encounter: Payer: Self-pay | Admitting: Internal Medicine

## 2020-04-17 ENCOUNTER — Ambulatory Visit (INDEPENDENT_AMBULATORY_CARE_PROVIDER_SITE_OTHER): Payer: Medicare Other | Admitting: Internal Medicine

## 2020-04-17 ENCOUNTER — Other Ambulatory Visit: Payer: Self-pay

## 2020-04-17 VITALS — BP 126/88 | HR 82 | Temp 98.4°F | Resp 16 | Ht 61.0 in | Wt 233.2 lb

## 2020-04-17 DIAGNOSIS — M4316 Spondylolisthesis, lumbar region: Secondary | ICD-10-CM

## 2020-04-17 DIAGNOSIS — N183 Chronic kidney disease, stage 3 unspecified: Secondary | ICD-10-CM | POA: Diagnosis not present

## 2020-04-17 DIAGNOSIS — E669 Obesity, unspecified: Secondary | ICD-10-CM | POA: Diagnosis not present

## 2020-04-17 DIAGNOSIS — E1169 Type 2 diabetes mellitus with other specified complication: Secondary | ICD-10-CM | POA: Diagnosis not present

## 2020-04-17 DIAGNOSIS — I10 Essential (primary) hypertension: Secondary | ICD-10-CM | POA: Diagnosis not present

## 2020-04-17 DIAGNOSIS — Z23 Encounter for immunization: Secondary | ICD-10-CM | POA: Diagnosis not present

## 2020-04-17 DIAGNOSIS — M47816 Spondylosis without myelopathy or radiculopathy, lumbar region: Secondary | ICD-10-CM | POA: Diagnosis not present

## 2020-04-17 LAB — BASIC METABOLIC PANEL
BUN: 25 mg/dL — ABNORMAL HIGH (ref 6–23)
CO2: 32 mEq/L (ref 19–32)
Calcium: 9.9 mg/dL (ref 8.4–10.5)
Chloride: 99 mEq/L (ref 96–112)
Creatinine, Ser: 1.51 mg/dL — ABNORMAL HIGH (ref 0.40–1.20)
GFR: 42.71 mL/min — ABNORMAL LOW (ref >60.00)
Glucose, Bld: 99 mg/dL (ref 70–99)
Potassium: 3.5 mEq/L (ref 3.5–5.1)
Sodium: 138 mEq/L (ref 135–145)

## 2020-04-17 LAB — URINALYSIS, ROUTINE W REFLEX MICROSCOPIC
Bilirubin Urine: NEGATIVE
Ketones, ur: NEGATIVE
Leukocytes,Ua: NEGATIVE
Nitrite: NEGATIVE
Specific Gravity, Urine: 1.02 (ref 1.000–1.030)
Total Protein, Urine: NEGATIVE
Urine Glucose: NEGATIVE
Urobilinogen, UA: 0.2 (ref 0.0–1.0)
pH: 6 (ref 5.0–8.0)

## 2020-04-17 NOTE — Progress Notes (Signed)
Subjective:  Patient ID: [name], female    DOB: [birth year]  Age: 59 y.o. MRN: [id number]  CC: Back Pain and Hypertension  This visit occurred during the SARS-CoV-2 public health emergency.  Safety protocols were in place, including screening questions prior to the visit, additional usage of staff PPE, and extensive cleaning of exam room while observing appropriate contact time as indicated for disinfecting solutions.    HPI [name] presents for f/up - She complains of a several month history of worsening low back pain and wants to see a pain specialist to see if she would benefit from an epidural steroid injection.  Her blood pressure has been well controlled.  She denies any recent episodes of headache, blurred vision, dizziness, lightheadedness, chest pain, shortness of breath, edema, or fatigue.  Outpatient Medications Prior to Visit  Medication Sig Dispense Refill  . Accu-Chek FastClix Lancets MISC USE 1  TO CHECK GLUCOSE TWICE DAILY 102 each 3  . albuterol (PROVENTIL HFA;VENTOLIN HFA) 108 (90 Base) MCG/ACT inhaler Inhale 2 puffs into the lungs every 4 (four) hours as needed for wheezing or shortness of breath (cough, shortness of breath or wheezing.). 1 Inhaler 1  . aluminum chloride (DRYSOL) 20 % external solution Apply topically at bedtime. 60 mL 3  . blood glucose meter kit and supplies KIT Use to test blood sugar twice daily. DX: E11.8 1 each 0  . cyclobenzaprine (FLEXERIL) 10 MG tablet Take 1 tablet (10 mg total) by mouth 2 (two) times daily as needed for muscle spasms. 20 tablet 0  . glucose blood (ACCU-CHEK GUIDE) test strip Use as instructed twice daily. 300 each 1  . levocetirizine (XYZAL) 5 MG tablet Take 1 tablet (5 mg total) by mouth every evening. 90 tablet 1  . metoprolol tartrate (LOPRESSOR) 100 MG tablet TAKE 1 TABLET 2 HR PRIOR TO CARDIAC PROCEDURE 1 tablet 0  . mometasone-formoterol (DULERA) 100-5 MCG/ACT AERO Inhale 2 puffs into the lungs 2 (two) times  daily. (Patient taking differently: Inhale 2 puffs into the lungs 2 (two) times daily as needed for wheezing or shortness of breath. ) 13 g 11  . potassium chloride (KLOR-CON) 10 MEQ tablet Take one daily as directed by MD 90 tablet 1  . rosuvastatin (CRESTOR) 20 MG tablet Take 1 tablet (20 mg total) by mouth daily. 90 tablet 1  . traMADol (ULTRAM) 50 MG tablet Take 1 tablet (50 mg total) by mouth every 6 (six) hours as needed. 60 tablet 3  . triamcinolone cream (KENALOG) 0.5 % Apply 1 application topically 3 (three) times daily. 454 g 1  . triamterene-hydrochlorothiazide (MAXZIDE) 75-50 MG tablet Take 1 tablet by mouth daily. DUE FOR APPT 30 tablet 1   No facility-administered medications prior to visit.    ROS Review of Systems  Constitutional: Positive for unexpected weight change (wt gain). Negative for appetite change, chills, diaphoresis and fatigue.  HENT: Negative.   Eyes: Negative.   Respiratory: Negative for cough, chest tightness, shortness of breath and wheezing.   Cardiovascular: Negative for chest pain, palpitations and leg swelling.  Gastrointestinal: Negative for abdominal pain, constipation, diarrhea, nausea and vomiting.  Endocrine: Negative.   Genitourinary: Negative.  Negative for difficulty urinating, dysuria, frequency and urgency.  Musculoskeletal: Positive for back pain. Negative for arthralgias, myalgias and neck pain.  Skin: Negative.  Negative for color change, pallor and rash.  Neurological: Negative.  Negative for dizziness, weakness and numbness.  Hematological: Negative for adenopathy. Does not bruise/bleed easily.  Psychiatric/Behavioral: Negative.     Objective:  BP 126/88 (BP Location: Left Arm, Patient Position: Sitting, Cuff Size: Large)   Pulse 82   Temp 98.4 F (36.9 C) (Oral)   Resp 16   Ht 5' 1"  (1.549 m)   Wt 233 lb 4 oz (105.8 kg)   LMP 05/17/2012   SpO2 97%   BMI 44.07 kg/m   BP Readings from Last 3 Encounters:  04/17/20 126/88    01/08/20 100/64  10/22/19 118/70    Wt Readings from Last 3 Encounters:  04/17/20 233 lb 4 oz (105.8 kg)  10/22/19 226 lb (102.5 kg)  10/18/19 226 lb (102.5 kg)    Physical Exam Vitals reviewed.  Constitutional:      Appearance: She is obese.  HENT:     Nose: Nose normal.     Mouth/Throat:     Mouth: Mucous membranes are moist.  Eyes:     General: No scleral icterus.    Conjunctiva/sclera: Conjunctivae normal.  Cardiovascular:     Rate and Rhythm: Normal rate and regular rhythm.     Heart sounds: No murmur heard.   Pulmonary:     Effort: Pulmonary effort is normal.     Breath sounds: No stridor. No wheezing, rhonchi or rales.  Abdominal:     General: Abdomen is flat. Bowel sounds are normal. There is no distension.     Palpations: Abdomen is soft. There is no hepatomegaly, splenomegaly or mass.     Tenderness: There is no abdominal tenderness.  Musculoskeletal:        General: Normal range of motion.     Cervical back: Normal and neck supple.     Thoracic back: Normal.     Lumbar back: Normal. No deformity, tenderness or bony tenderness. Normal range of motion. Negative right straight leg raise test and negative left straight leg raise test.     Right lower leg: No edema.     Left lower leg: No edema.  Lymphadenopathy:     Cervical: No cervical adenopathy.  Skin:    General: Skin is warm and dry.     Coloration: Skin is not pale.  Neurological:     General: No focal deficit present.     Mental Status: She is oriented to person, place, and time. Mental status is at baseline.  Psychiatric:        Mood and Affect: Mood normal.        Behavior: Behavior normal.     Lab Results  Component Value Date   WBC 5.4 10/17/2019   HGB 13.4 10/17/2019   HCT 42.8 10/17/2019   PLT 260 10/17/2019   GLUCOSE 99 04/17/2020   CHOL 197 07/11/2019   TRIG 113.0 07/11/2019   HDL 50.90 07/11/2019   LDLCALC 124 (H) 07/11/2019   ALT 25 08/09/2019   AST 26 08/09/2019   NA 138  04/17/2020   K 3.5 04/17/2020   CL 99 04/17/2020   CREATININE 1.51 (H) 04/17/2020   BUN 25 (H) 04/17/2020   CO2 32 04/17/2020   TSH 2.80 07/11/2019   HGBA1C 6.6 (H) 04/17/2020   MICROALBUR <0.7 10/18/2019    MM 3D SCREEN BREAST BILATERAL  Result Date: 03/27/2020 CLINICAL DATA:  Screening. EXAM: DIGITAL SCREENING BILATERAL MAMMOGRAM WITH TOMO AND CAD COMPARISON:  Previous exam(s). ACR Breast Density Category b: There are scattered areas of fibroglandular density. FINDINGS: There are no findings suspicious for malignancy. Images were processed with CAD. IMPRESSION: No mammographic evidence of malignancy. A  result letter of this screening mammogram will be mailed directly to the patient. RECOMMENDATION: Screening mammogram in one year. (Code:SM-B-01Y) BI-RADS CATEGORY  1: Negative. Electronically Signed   By: Lovey Newcomer M.D.   On: 03/27/2020 08:08    Assessment & Plan:   Dylan was seen today for back pain and hypertension.  Diagnoses and all orders for this visit:  Essential hypertension- Her blood pressure is well controlled but she has a prerenal azotemia.  I recommended that she consolidate her antihypertensive regimen into monotherapy with a thiazide diuretic. -     Basic metabolic panel; Future -     Urinalysis, Routine w reflex microscopic; Future -     Basic metabolic panel -     Urinalysis, Routine w reflex microscopic -     indapamide (LOZOL) 1.25 MG tablet; Take 1 tablet (1.25 mg total) by mouth daily.  Type 2 diabetes mellitus with obesity ([place])- Her A1c is at 6.6%.  Her blood sugars are adequately well controlled. -     Basic metabolic panel; Future -     Hemoglobin A1c; Future -     Basic metabolic panel -     Hemoglobin A1c  Lumbar facet arthropathy -     Ambulatory referral to Pain Clinic  Spondylolisthesis of lumbar region -     Ambulatory referral to Pain Clinic  Stage 3 chronic kidney disease, unspecified whether stage 3a or 3b CKD- I recommended that she  reduce the overall dosage of her diuretic regimen and avoid nephrotoxic agents. -     Basic metabolic panel; Future -     Urinalysis, Routine w reflex microscopic; Future -     Basic metabolic panel -     Urinalysis, Routine w reflex microscopic  Need for pneumococcal vaccination -     Pneumococcal polysaccharide vaccine 23-valent greater than or equal to 2yo subcutaneous/IM   I have discontinued Krizia P. Elias's triamterene-hydrochlorothiazide. I am also having her start on indapamide. Additionally, I am having her maintain her aluminum chloride, albuterol, mometasone-formoterol, triamcinolone cream, blood glucose meter kit and supplies, cyclobenzaprine, potassium chloride, metoprolol tartrate, Accu-Chek FastClix Lancets, Accu-Chek Guide, rosuvastatin, levocetirizine, and traMADol.  Meds ordered this encounter  Medications  . indapamide (LOZOL) 1.25 MG tablet    Sig: Take 1 tablet (1.25 mg total) by mouth daily.    Dispense:  90 tablet    Refill:  0     Follow-up: Return in about 3 months (around 07/18/2020).  [name], MD

## 2020-04-17 NOTE — Patient Instructions (Signed)

## 2020-04-18 ENCOUNTER — Telehealth: Payer: Self-pay | Admitting: Internal Medicine

## 2020-04-18 LAB — HEMOGLOBIN A1C: Hgb A1c MFr Bld: 6.6 % — ABNORMAL HIGH (ref 4.6–6.5)

## 2020-04-18 NOTE — Telephone Encounter (Signed)
New message:   Pt is calling and states that Dr. Ronnald Ramp was supposed to be sending a new BP medication into the pharmacy for her on yesterday. Please advise.

## 2020-04-19 MED ORDER — INDAPAMIDE 1.25 MG PO TABS: 1.2500 mg | ORAL_TABLET | Freq: Every day | ORAL | 0 refills | Status: DC

## 2020-04-21 NOTE — Telephone Encounter (Signed)
Pt contacted and informed that indapamide has been sent in to the pharmacy.

## 2020-04-22 ENCOUNTER — Telehealth: Payer: Self-pay

## 2020-04-22 NOTE — Telephone Encounter (Signed)
Spoke to pt and informed per pcp "the new medication would not cause the side effects and can change patient back to her old bp medication."  Pt stated that she would like to go back on her old medication.

## 2020-04-22 NOTE — Telephone Encounter (Signed)
Pt states that she is having side effects from the indapamide - eyes are burning, bad headache and she was not able to sleep last night.   Please advise.

## 2020-04-22 NOTE — Telephone Encounter (Signed)
New message   Pt c/o medication issue:  1. Name of Medication: indapamide (LOZOL) 1.25 MG tablet  2. How are you currently taking this medication (dosage and times per day)?  Once a day   3. Are you having a reaction (difficulty breathing--STAT)? No   4. What is your medication issue?  Eye burning , bad headache could not sleep

## 2020-04-23 ENCOUNTER — Other Ambulatory Visit: Payer: Self-pay | Admitting: Internal Medicine

## 2020-04-23 DIAGNOSIS — I10 Essential (primary) hypertension: Secondary | ICD-10-CM

## 2020-04-23 DIAGNOSIS — E876 Hypokalemia: Secondary | ICD-10-CM

## 2020-04-23 MED ORDER — TRIAMTERENE-HCTZ 37.5-25 MG PO CAPS: 1.0000 | ORAL_CAPSULE | Freq: Every day | ORAL | 1 refills | Status: DC

## 2020-04-23 NOTE — Telephone Encounter (Signed)
Pt contacted and informed that rx has been sent in to [facility].

## 2020-04-23 NOTE — Telephone Encounter (Signed)
New message:    Pt states she is switching back to her old BP medication due to the new one causing issues but she states she is completely out of the old medication. Please advise.  [place] ([place] - [address]  Agent: Please be advised that RX refills may take up to 3 business days. We ask that you follow-up with your pharmacy.

## 2020-05-19 ENCOUNTER — Other Ambulatory Visit: Payer: Self-pay

## 2020-05-19 ENCOUNTER — Ambulatory Visit: Payer: Medicare Other | Admitting: Pharmacist

## 2020-05-19 DIAGNOSIS — I10 Essential (primary) hypertension: Secondary | ICD-10-CM

## 2020-05-19 DIAGNOSIS — E785 Hyperlipidemia, unspecified: Secondary | ICD-10-CM

## 2020-05-19 DIAGNOSIS — E118 Type 2 diabetes mellitus with unspecified complications: Secondary | ICD-10-CM

## 2020-05-19 NOTE — Chronic Care Management (AMB) (Signed)
Chronic Care Management Pharmacy  Name: JANYLAH BELGRAVE  MRN: [id number] DOB: [birth year]   Chief Complaint/ HPI  [name],  59 y.o. , female presents for their Initial CCM visit with the clinical pharmacist via telephone due to COVID-19 Pandemic.  PCP : [name], MD Patient Care Team: [name], MD as PCP - General Buford Dresser, MD as PCP - Cardiology (Cardiology) Kennith Gain, MD as Consulting Physician (Allergy) Kennith Gain, MD as Consulting Physician (Allergy) Magnus Sinning, MD as Consulting Physician (Physical Medicine and Rehabilitation) Charlton Haws, [facility] as Pharmacist (Pharmacist)  Their chronic conditions include: Hypertension, Hyperlipidemia, Diabetes, GERD, Asthma, Chronic Kidney Disease, Osteoarthritis and Obesity, Chronic Pain, Allergies  Pt is Involved in church, exercises with groups there frequently. Pt has 14 grandchildren, age 19-21 all local, she babysits every few weeks. Married 16 years.   Office Visits: 04/17/20 Dr Ronnald Ramp OV: BP controlled but w/ prerenal azotemia - consolidate BP regimen into monotherapy with thiazide diuretic indapamide 1.25 mg daily. Referred to pain clinic. Pt c/o side effects to indapamide so went back to triamterence/HCTZ.  Consult Visit: 10/22/19 Dr Harrell Gave (cardiology): new pt for long QT and chest pain. Manual calculation of QT is actually ok (380s). Scheduled treadmill stress test.  10/17/19 ED visit: chest pain - cardiac r/o negative, low potassium in ED, dc'd on 3 days of KCl 20 meq BID.  Allergies  Allergen Reactions  . Verapamil Swelling    Medications: Outpatient Encounter Medications as of 05/19/2020  Medication Sig  . Accu-Chek FastClix Lancets MISC USE 1  TO CHECK GLUCOSE TWICE DAILY  . albuterol (PROVENTIL HFA;VENTOLIN HFA) 108 (90 Base) MCG/ACT inhaler Inhale 2 puffs into the lungs every 4 (four) hours as needed for wheezing or shortness of breath  (cough, shortness of breath or wheezing.).  Marland Kitchen aluminum chloride (DRYSOL) 20 % external solution Apply topically at bedtime.  . blood glucose meter kit and supplies KIT Use to test blood sugar twice daily. DX: E11.8  . glucose blood (ACCU-CHEK GUIDE) test strip Use as instructed twice daily.  Marland Kitchen levocetirizine (XYZAL) 5 MG tablet Take 1 tablet (5 mg total) by mouth every evening.  . metoprolol tartrate (LOPRESSOR) 100 MG tablet TAKE 1 TABLET 2 HR PRIOR TO CARDIAC PROCEDURE  . mometasone-formoterol (DULERA) 100-5 MCG/ACT AERO Inhale 2 puffs into the lungs 2 (two) times daily. (Patient taking differently: Inhale 2 puffs into the lungs 2 (two) times daily as needed for wheezing or shortness of breath. )  . potassium chloride (KLOR-CON) 10 MEQ tablet Take one daily as directed by MD  . rosuvastatin (CRESTOR) 20 MG tablet Take 1 tablet (20 mg total) by mouth daily.  . traMADol (ULTRAM) 50 MG tablet Take 1 tablet (50 mg total) by mouth every 6 (six) hours as needed.  . triamcinolone cream (KENALOG) 0.5 % Apply 1 application topically 3 (three) times daily.  Marland Kitchen triamterene-hydrochlorothiazide (DYAZIDE) 37.5-25 MG capsule Take 1 each (1 capsule total) by mouth daily.  . cyclobenzaprine (FLEXERIL) 10 MG tablet Take 1 tablet (10 mg total) by mouth 2 (two) times daily as needed for muscle spasms. (Patient not taking: Reported on 05/19/2020)   No facility-administered encounter medications on file as of 05/19/2020.     Current Diagnosis/Assessment:  SDOH Interventions     Most Recent Value  SDOH Interventions  Financial Strain Interventions Intervention Not Indicated      Goals Addressed            This  Visit's Progress   . Pharmacy Care Plan       CARE PLAN ENTRY (see longitudinal plan of care for additional care plan information)  Current Barriers:  . Chronic Disease Management support, education, and care coordination needs related to Hypertension, Hyperlipidemia, and Diabetes     Hypertension BP Readings from Last 3 Encounters:  04/17/20 126/88  01/08/20 100/64  10/22/19 118/70 .  Pharmacist Clinical Goal(s): o Over the next 120 days, patient will work with PharmD and providers to maintain BP goal <130/80 . Current regimen:  o Triamterence-HCTZ 37.5-25 mg daily . Interventions: o Discussed BP goals and benefits of medication for prevention of heart attack / stroke . Patient self care activities - Over the next 120 days, patient will: o Check BP as needed, document, and provide at future appointments o Ensure daily salt intake < 2000 mg/day  Hyperlipidemia Lab Results  Component Value Date/Time   LDLCALC 124 (H) 07/11/2019 09:04 AM .  Pharmacist Clinical Goal(s): o Over the next 120 days, patient will work with PharmD and providers to achieve LDL goal < 100 . Current regimen:  o Rosuvastatin 20 mg daily . Interventions: o Discussed Cholesterol goals and benefits of medication for prevention of heart attack / stroke . Patient self care activities - Over the next 120 days, patient will: o Continue medication as prescribed o Continue low cholesterol diet (avoid fried foods, red meat)  Diabetes Lab Results  Component Value Date/Time   HGBA1C 6.6 (H) 04/17/2020 10:43 AM   HGBA1C 6.3 10/18/2019 04:18 PM .  Pharmacist Clinical Goal(s): o Over the next 120 days, patient will work with PharmD and providers to maintain A1c goal <7% . Current regimen:  o No medications . Interventions: o Discussed benefits of healthy diet for prevention of diabetes complications o Discussed sugar content in various drinks - juices, sodas high in sugar . Patient self care activities - Over the next 120 days, patient will: o Switch to diet/low sugar drinks o Limit sweets and high-carb foods (see attached)  Medication management . Pharmacist Clinical Goal(s): o Over the next 120 days, patient will work with PharmD and providers to maintain optimal medication  adherence . Current pharmacy: [facility] . Interventions o Comprehensive medication review performed. o Continue current medication management strategy . Patient self care activities - Over the next 120 days, patient will: o Focus on medication adherence by fill date o Take medications as prescribed o Report any questions or concerns to PharmD and/or provider(s)  Initial goal documentation       Hypertension   BP goal is:  <130/80  Office blood pressures are  BP Readings from Last 3 Encounters:  04/17/20 126/88  01/08/20 100/64  10/22/19 118/70   Kidney Function Lab Results  Component Value Date/Time   CREATININE 1.51 (H) 04/17/2020 10:43 AM   CREATININE 1.50 (H) 01/08/2020 03:36 PM   GFR 42.71 (L) 04/17/2020 10:43 AM   GFRNONAA 40 (L) 10/17/2019 09:58 AM   GFRAA 46 (L) 10/17/2019 09:58 AM   K 3.5 04/17/2020 10:43 AM   K 3.6 10/18/2019 04:18 PM   Patient checks BP at home infrequently Patient home BP readings are ranging: n/a  Patient has failed these meds in the past: chlorthalidone, furosemide, Bystolic, spironolactone, verapamil Patient is currently controlled on the following medications:  . Triamterence-HCTZ 37.5-25 mg daily  We discussed diet and exercise extensively; low salt diet - counseled to check sodium content of packaged foods and avoid adding excess salt to prepared foods.  Plan  Continue current medications and control with diet and exercise    Hyperlipidemia   LDL goal < 100  Lipid Panel     Component Value Date/Time   CHOL 197 07/11/2019 0904   TRIG 113.0 07/11/2019 0904   HDL 50.90 07/11/2019 0904   LDLCALC 124 (H) 07/11/2019 0904    Hepatic Function Latest Ref Rng & Units 08/09/2019 06/21/2019 12/14/2016  Total Protein 6.5 - 8.1 g/dL 8.6(H) - 7.8  Albumin 3.5 - 5.0 g/dL 3.9 4.2 4.0  AST 15 - 41 U/L 26 - 18  ALT 0 - 44 U/L 25 - 15  Alk Phosphatase 38 - 126 U/L 130(H) - 132(H)  Total Bilirubin 0.3 - 1.2 mg/dL 0.6 - 0.3  Bilirubin, Direct  0.0 - 0.3 mg/dL - - -    The 10-year ASCVD risk score Mikey Bussing DC Jr., et al., 2013) is: 13.7%   Values used to calculate the score:     Age: 54 years     Sex: Female     Is Non-Hispanic African American: Yes     Diabetic: Yes     Tobacco smoker: No     Systolic Blood Pressure: 423 mmHg     Is BP treated: Yes     HDL Cholesterol: 50.9 mg/dL     Total Cholesterol: 197 mg/dL   Patient has failed these meds in past: n/a Patient is currently controlled on the following medications:  . Rosuvastatin 20 mg daily  We discussed:  diet and exercise extensively; cholesterol goals; statin was started after most recent lipid panel, next panel will reflect LDL on a statin. Pt has cut back on fried foods, red meat.  Plan  Continue current medications and control with diet and exercise   Diabetes   A1c goal <7%  Recent Relevant Labs: Lab Results  Component Value Date/Time   HGBA1C 6.6 (H) 04/17/2020 10:43 AM   HGBA1C 6.3 10/18/2019 04:18 PM   GFR 42.71 (L) 04/17/2020 10:43 AM   GFR 47.86 (L) 10/18/2019 04:18 PM   MICROALBUR <0.7 10/18/2019 04:18 PM    Last diabetic Eye exam: No results found for: HMDIABEYEEXA  Last diabetic Foot exam: No results found for: HMDIABFOOTEX   Patient has failed these meds in past: n/a Patient is currently uncontrolled on the following medications: . No medications  We discussed: diet and exercise extensively - switched white bread to wheat bread; avoiding fried foods; eats salads, fruits every day. Pt drinks 1 regular sprite per day - counseled to switch to diet drinks, low sugar/low carb options for yogurt, juices, snacks, etc.  Plan  Continue control with diet and exercise   Asthma / Allergies   Last spirometry score: n/a  Patient has failed these meds in past: n/a Patient is currently controlled on the following medications:  . Dulera (mometasone-formoterol) 100-5 mcg 2 puffs BID . Albuterol HFA PRN . Levocetirizine 5 mg qPM  Using maintenance  inhaler regularly? No Frequency of rescue inhaler use:  never  We discussed:  Pt reports asthma symptoms flare up during spring and very hot days in summer; she does not use Dulera on a daily basis, only when symptoms flare; discussed Ruthe Mannan is a maintenance inhaler meant to prevent asthma symptoms, SOB; pt voiced understanding and will continue to use Dulera PRN.  Plan  Continue current medications  Chronic pain   Patient has failed these meds in past: n/a Patient is currently uncontrolled on the following medications:  . Tramadol 50 mg TID .  Cyclobenzaprine 10 mg BID prn  We discussed: Tramadol usually works with leg pain; She tries to walk an hour every day. Pt is worried about taking pain meds long term. Back pain flares when she eats, tried switching chairs and this improved. Pt has pain mgmt appt this week.   Plan  Continue current medications   Health Maintenance   Patient is currently controlled on the following medications:  . Potassium chloride 10 mEq daily as directed . Triamcinolone cream 0.5%  We discussed:  Patient is satisfied with current regimen and denies issues  Plan  Continue current medications   Medication Management   Pt uses [facility] for all medications Uses pill box? No - prefers bottles Pt endorses 99% compliance  We discussed: [facility] preferred pharmacy with insurance plan; pt is satisfied with services  Plan  Continue current medication management strategy    Follow up: 4 month phone visit  Charlene Brooke, PharmD Clinical Pharmacist [facility] at [facility] [phone]

## 2020-05-19 NOTE — Patient Instructions (Addendum)
Visit Information  Phone number for Pharmacist: [phone]  Thank you for meeting with me to discuss your medications! I look forward to working with you to achieve your health care goals. Below is a summary of what we talked about during the visit:  Goals Addressed            This Visit's Progress   . Pharmacy Care Plan       CARE PLAN ENTRY (see longitudinal plan of care for additional care plan information)  Current Barriers:  . Chronic Disease Management support, education, and care coordination needs related to Hypertension, Hyperlipidemia, and Diabetes   Hypertension BP Readings from Last 3 Encounters:  04/17/20 126/88  01/08/20 100/64  10/22/19 118/70 .  Pharmacist Clinical Goal(s): o Over the next 120 days, patient will work with PharmD and providers to maintain BP goal <130/80 . Current regimen:  o Triamterence-HCTZ 37.5-25 mg daily . Interventions: o Discussed BP goals and benefits of medication for prevention of heart attack / stroke . Patient self care activities - Over the next 120 days, patient will: o Check BP as needed, document, and provide at future appointments o Ensure daily salt intake < 2000 mg/day  Hyperlipidemia Lab Results  Component Value Date/Time   LDLCALC 124 (H) 07/11/2019 09:04 AM .  Pharmacist Clinical Goal(s): o Over the next 120 days, patient will work with PharmD and providers to achieve LDL goal < 100 . Current regimen:  o Rosuvastatin 20 mg daily . Interventions: o Discussed Cholesterol goals and benefits of medication for prevention of heart attack / stroke . Patient self care activities - Over the next 120 days, patient will: o Continue medication as prescribed o Continue low cholesterol diet (avoid fried foods, red meat)  Diabetes Lab Results  Component Value Date/Time   HGBA1C 6.6 (H) 04/17/2020 10:43 AM   HGBA1C 6.3 10/18/2019 04:18 PM .  Pharmacist Clinical Goal(s): o Over the next 120 days, patient will work with  PharmD and providers to maintain A1c goal <7% . Current regimen:  o No medications . Interventions: o Discussed benefits of healthy diet for prevention of diabetes complications o Discussed sugar content in various drinks - juices, sodas high in sugar . Patient self care activities - Over the next 120 days, patient will: o Switch to diet/low sugar drinks o Limit sweets and high-carb foods (see attached)  Medication management . Pharmacist Clinical Goal(s): o Over the next 120 days, patient will work with PharmD and providers to maintain optimal medication adherence . Current pharmacy: [facility] . Interventions o Comprehensive medication review performed. o Continue current medication management strategy . Patient self care activities - Over the next 120 days, patient will: o Focus on medication adherence by fill date o Take medications as prescribed o Report any questions or concerns to PharmD and/or provider(s)  Initial goal documentation       [name] was given information about Chronic Care Management services today including:  1. CCM service includes personalized support from designated clinical staff supervised by her physician, including individualized plan of care and coordination with other care providers 2. 24/7 contact phone numbers for assistance for urgent and routine care needs. 3. Standard insurance, coinsurance, copays and deductibles apply for chronic care management only during months in which we provide at least 20 minutes of these services. Most insurances cover these services at 100%, however patients may be responsible for any copay, coinsurance and/or deductible if applicable. This service may help you avoid the need for  more expensive face-to-face services. 4. Only one practitioner may furnish and bill the service in a calendar month. 5. The patient may stop CCM services at any time (effective at the end of the month) by phone call to the office  staff.  Patient agreed to services and verbal consent obtained.   The patient verbalized understanding of instructions provided today and agreed to receive a mailed copy of patient instruction and/or educational materials. Telephone follow up appointment with pharmacy team member scheduled for: 4 months  Charlene Brooke, PharmD Clinical Pharmacist [facility] at [facility] [phone]  Prediabetes Eating Plan Prediabetes is a condition that causes blood sugar (glucose) levels to be higher than normal. This increases the risk for developing diabetes. In order to prevent diabetes from developing, your health care provider may recommend a diet and other lifestyle changes to help you:  Control your blood glucose levels.  Improve your cholesterol levels.  Manage your blood pressure. Your health care provider may recommend working with a diet and nutrition specialist (dietitian) to make a meal plan that is best for you. What are tips for following this plan? Lifestyle  Set weight loss goals with the help of your health care team. It is recommended that most people with prediabetes lose 7% of their current body weight.  Exercise for at least 30 minutes at least 5 days a week.  Attend a support group or seek ongoing support from a mental health counselor.  Take over-the-counter and prescription medicines only as told by your health care provider. Reading food labels  Read food labels to check the amount of fat, salt (sodium), and sugar in prepackaged foods. Avoid foods that have: ? Saturated fats. ? Trans fats. ? Added sugars.  Avoid foods that have more than 300 milligrams (mg) of sodium per serving. Limit your daily sodium intake to less than 2,300 mg each day. Shopping  Avoid buying pre-made and processed foods. Cooking  Cook with olive oil. Do not use butter, lard, or ghee.  Bake, broil, grill, or boil foods. Avoid frying. Meal planning   Work with your  dietitian to develop an eating plan that is right for you. This may include: ? Tracking how many calories you take in. Use a food diary, notebook, or mobile application to track what you eat at each meal. ? Using the glycemic index (GI) to plan your meals. The index tells you how quickly a food will raise your blood glucose. Choose low-GI foods. These foods take a longer time to raise blood glucose.  Consider following a Mediterranean diet. This diet includes: ? Several servings each day of fresh fruits and vegetables. ? Eating fish at least twice a week. ? Several servings each day of whole grains, beans, nuts, and seeds. ? Using olive oil instead of other fats. ? Moderate alcohol consumption. ? Eating small amounts of red meat and whole-fat dairy.  If you have high blood pressure, you may need to limit your sodium intake or follow a diet such as the DASH eating plan. DASH is an eating plan that aims to lower high blood pressure. What foods are recommended? The items listed below may not be a complete list. Talk with your dietitian about what dietary choices are best for you. Grains Whole grains, such as whole-wheat or whole-grain breads, crackers, cereals, and pasta. Unsweetened oatmeal. Bulgur. Barley. Quinoa. Brown rice. Corn or whole-wheat flour tortillas or taco shells. Vegetables Lettuce. Spinach. Peas. Beets. Cauliflower. Cabbage. Broccoli. Carrots. Tomatoes. Squash. Eggplant.  Herbs. Peppers. Onions. Cucumbers. Brussels sprouts. Fruits Berries. Bananas. Apples. Oranges. Grapes. Papaya. Mango. Pomegranate. Kiwi. Grapefruit. Cherries. Meats and other protein foods Seafood. Poultry without skin. Lean cuts of pork and beef. Tofu. Eggs. Nuts. Beans. Dairy Low-fat or fat-free dairy products, such as yogurt, cottage cheese, and cheese. Beverages Water. Tea. Coffee. Sugar-free or diet soda. Seltzer water. Lowfat or no-fat milk. Milk alternatives, such as soy or almond milk. Fats and  oils Olive oil. Canola oil. Sunflower oil. Grapeseed oil. Avocado. Walnuts. Sweets and desserts Sugar-free or low-fat pudding. Sugar-free or low-fat ice cream and other frozen treats. Seasoning and other foods Herbs. Sodium-free spices. Mustard. Relish. Low-fat, low-sugar ketchup. Low-fat, low-sugar barbecue sauce. Low-fat or fat-free mayonnaise. What foods are not recommended? The items listed below may not be a complete list. Talk with your dietitian about what dietary choices are best for you. Grains Refined white flour and flour products, such as bread, pasta, snack foods, and cereals. Vegetables Canned vegetables. Frozen vegetables with butter or cream sauce. Fruits Fruits canned with syrup. Meats and other protein foods Fatty cuts of meat. Poultry with skin. Breaded or fried meat. Processed meats. Dairy Full-fat yogurt, cheese, or milk. Beverages Sweetened drinks, such as sweet iced tea and soda. Fats and oils Butter. Lard. Ghee. Sweets and desserts Baked goods, such as cake, cupcakes, pastries, cookies, and cheesecake. Seasoning and other foods Spice mixes with added salt. Ketchup. Barbecue sauce. Mayonnaise. Summary  To prevent diabetes from developing, you may need to make diet and other lifestyle changes to help control blood sugar, improve cholesterol levels, and manage your blood pressure.  Set weight loss goals with the help of your health care team. It is recommended that most people with prediabetes lose 7 percent of their current body weight.  Consider following a Mediterranean diet that includes plenty of fresh fruits and vegetables, whole grains, beans, nuts, seeds, fish, lean meat, low-fat dairy, and healthy oils. This information is not intended to replace advice given to you by your health care provider. Make sure you discuss any questions you have with your health care provider. Document Revised: 02/16/2019 Document Reviewed: 12/29/2016 Elsevier Patient  Education  2020 [employer].

## 2020-05-26 DIAGNOSIS — M47817 Spondylosis without myelopathy or radiculopathy, lumbosacral region: Secondary | ICD-10-CM | POA: Diagnosis not present

## 2020-05-26 DIAGNOSIS — Z79899 Other long term (current) drug therapy: Secondary | ICD-10-CM | POA: Diagnosis not present

## 2020-05-26 DIAGNOSIS — M5136 Other intervertebral disc degeneration, lumbar region: Secondary | ICD-10-CM | POA: Diagnosis not present

## 2020-05-26 DIAGNOSIS — M25552 Pain in left hip: Secondary | ICD-10-CM | POA: Diagnosis not present

## 2020-05-26 DIAGNOSIS — G894 Chronic pain syndrome: Secondary | ICD-10-CM | POA: Diagnosis not present

## 2020-05-26 DIAGNOSIS — Z79891 Long term (current) use of opiate analgesic: Secondary | ICD-10-CM | POA: Diagnosis not present

## 2020-05-27 ENCOUNTER — Other Ambulatory Visit: Payer: Self-pay | Admitting: Pain Medicine

## 2020-05-27 DIAGNOSIS — G8929 Other chronic pain: Secondary | ICD-10-CM

## 2020-05-27 NOTE — Addendum Note (Signed)
Addended by: Aviva Signs M on: 05/27/2020 11:25 AM   Modules accepted: Orders

## 2020-06-19 ENCOUNTER — Ambulatory Visit
Admission: RE | Admit: 2020-06-19 | Discharge: 2020-06-19 | Disposition: A | Discharge status: Home / Self Care | Payer: Medicare Other | Source: Ambulatory Visit | Attending: Pain Medicine | Admitting: Pain Medicine

## 2020-06-19 DIAGNOSIS — M48061 Spinal stenosis, lumbar region without neurogenic claudication: Secondary | ICD-10-CM | POA: Diagnosis not present

## 2020-06-19 DIAGNOSIS — G8929 Other chronic pain: Secondary | ICD-10-CM

## 2020-06-20 ENCOUNTER — Other Ambulatory Visit: Payer: Self-pay | Admitting: Pain Medicine

## 2020-06-20 ENCOUNTER — Ambulatory Visit
Admission: RE | Admit: 2020-06-20 | Discharge: 2020-06-20 | Disposition: A | Discharge status: Home / Self Care | Payer: Medicare Other | Source: Ambulatory Visit | Attending: Pain Medicine | Admitting: Pain Medicine

## 2020-06-20 ENCOUNTER — Other Ambulatory Visit: Payer: Self-pay

## 2020-06-20 DIAGNOSIS — M1612 Unilateral primary osteoarthritis, left hip: Secondary | ICD-10-CM | POA: Diagnosis not present

## 2020-06-20 DIAGNOSIS — M25552 Pain in left hip: Secondary | ICD-10-CM

## 2020-06-23 DIAGNOSIS — M25552 Pain in left hip: Secondary | ICD-10-CM | POA: Diagnosis not present

## 2020-06-23 DIAGNOSIS — M5136 Other intervertebral disc degeneration, lumbar region: Secondary | ICD-10-CM | POA: Diagnosis not present

## 2020-06-23 DIAGNOSIS — M47817 Spondylosis without myelopathy or radiculopathy, lumbosacral region: Secondary | ICD-10-CM | POA: Diagnosis not present

## 2020-06-23 DIAGNOSIS — G894 Chronic pain syndrome: Secondary | ICD-10-CM | POA: Diagnosis not present

## 2020-07-07 DIAGNOSIS — M47817 Spondylosis without myelopathy or radiculopathy, lumbosacral region: Secondary | ICD-10-CM | POA: Diagnosis not present

## 2020-07-16 DIAGNOSIS — M7062 Trochanteric bursitis, left hip: Secondary | ICD-10-CM | POA: Diagnosis not present

## 2020-07-17 ENCOUNTER — Telehealth: Payer: Self-pay | Admitting: Internal Medicine

## 2020-07-17 ENCOUNTER — Other Ambulatory Visit: Payer: Self-pay | Admitting: Internal Medicine

## 2020-07-17 DIAGNOSIS — E785 Hyperlipidemia, unspecified: Secondary | ICD-10-CM

## 2020-07-17 MED ORDER — ROSUVASTATIN CALCIUM 20 MG PO TABS: 20.0000 mg | ORAL_TABLET | Freq: Every day | ORAL | 1 refills | Status: DC

## 2020-07-17 NOTE — Telephone Encounter (Signed)
Patient called and is requesting a refill on rosuvastatin (CRESTOR) 20 MG tablet Said that she called her pharmacy and they told her to call us.  [place] ([place]), [place] - [address]

## 2020-07-19 DIAGNOSIS — H52223 Regular astigmatism, bilateral: Secondary | ICD-10-CM | POA: Diagnosis not present

## 2020-07-19 DIAGNOSIS — H5203 Hypermetropia, bilateral: Secondary | ICD-10-CM | POA: Diagnosis not present

## 2020-07-19 DIAGNOSIS — H524 Presbyopia: Secondary | ICD-10-CM | POA: Diagnosis not present

## 2020-07-19 DIAGNOSIS — H2513 Age-related nuclear cataract, bilateral: Secondary | ICD-10-CM | POA: Diagnosis not present

## 2020-07-28 DIAGNOSIS — M47817 Spondylosis without myelopathy or radiculopathy, lumbosacral region: Secondary | ICD-10-CM | POA: Diagnosis not present

## 2020-08-11 ENCOUNTER — Other Ambulatory Visit: Payer: Self-pay | Admitting: Internal Medicine

## 2020-08-11 DIAGNOSIS — L2084 Intrinsic (allergic) eczema: Secondary | ICD-10-CM

## 2020-08-20 LAB — HM DIABETES EYE EXAM

## 2020-08-25 DIAGNOSIS — G894 Chronic pain syndrome: Secondary | ICD-10-CM | POA: Diagnosis not present

## 2020-08-25 DIAGNOSIS — M1612 Unilateral primary osteoarthritis, left hip: Secondary | ICD-10-CM | POA: Diagnosis not present

## 2020-08-25 DIAGNOSIS — Z79891 Long term (current) use of opiate analgesic: Secondary | ICD-10-CM | POA: Diagnosis not present

## 2020-08-25 DIAGNOSIS — Z79899 Other long term (current) drug therapy: Secondary | ICD-10-CM | POA: Diagnosis not present

## 2020-09-01 DIAGNOSIS — M47817 Spondylosis without myelopathy or radiculopathy, lumbosacral region: Secondary | ICD-10-CM | POA: Diagnosis not present

## 2020-09-12 ENCOUNTER — Telehealth: Payer: Self-pay | Admitting: Pharmacist

## 2020-09-12 NOTE — Progress Notes (Signed)
Chronic Care Management Pharmacy Assistant   Name: [name]  MRN: [id number] DOB: 11/14/1960  Reason for Encounter: Medication Review-Initial Questions for Pharmacist visit on 09/15/2020  Have you seen any other providers since your last visit? Patient states that she has been to a doctor for pain management for her back and that she is getting radiation.  Any changes in your medications or health?  Patient states that she is taking 2 tramadol's four times a day instead of 1 four times a day now.  Any side effects from any medications? None   Do you have an symptoms or problems not managed by your medications? None  Any concerns about your health right now? None  Has your provider asked that you check blood pressure, blood sugar, or follow special diet at home? No  Do you get any type of exercise on a regular basis? Patient states that she does walk around her back yard  Can you think of a goal you would like to reach for your health? Patient states that she would like to get healthy so that she can return back to work after being out of work for about 3 years. Also she would like to come off of the pain medication  Do you have any problems getting your medications? No   Is there anything that you would like to discuss during the appointment? The patient states not at this time   Please bring medications and supplements to appointment     PCP : [name], MD  Allergies:   Allergies  Allergen Reactions  . Verapamil Swelling    Medications: Outpatient Encounter Medications as of 09/12/2020  Medication Sig  . Accu-Chek FastClix Lancets MISC USE 1  TO CHECK GLUCOSE TWICE DAILY  . albuterol (PROVENTIL HFA;VENTOLIN HFA) 108 (90 Base) MCG/ACT inhaler Inhale 2 puffs into the lungs every 4 (four) hours as needed for wheezing or shortness of breath (cough, shortness of breath or wheezing.).  Marland Kitchen aluminum chloride (DRYSOL) 20 % external solution Apply topically at  bedtime.  . blood glucose meter kit and supplies KIT Use to test blood sugar twice daily. DX: E11.8  . cyclobenzaprine (FLEXERIL) 10 MG tablet Take 1 tablet (10 mg total) by mouth 2 (two) times daily as needed for muscle spasms. (Patient not taking: Reported on 05/19/2020)  . glucose blood (ACCU-CHEK GUIDE) test strip Use as instructed twice daily.  Marland Kitchen levocetirizine (XYZAL) 5 MG tablet TAKE 1 TABLET BY MOUTH EVERY EVENING  . metoprolol tartrate (LOPRESSOR) 100 MG tablet TAKE 1 TABLET 2 HR PRIOR TO CARDIAC PROCEDURE  . mometasone-formoterol (DULERA) 100-5 MCG/ACT AERO Inhale 2 puffs into the lungs 2 (two) times daily. (Patient taking differently: Inhale 2 puffs into the lungs 2 (two) times daily as needed for wheezing or shortness of breath. )  . potassium chloride (KLOR-CON) 10 MEQ tablet Take one daily as directed by MD  . rosuvastatin (CRESTOR) 20 MG tablet Take 1 tablet (20 mg total) by mouth daily.  . traMADol (ULTRAM) 50 MG tablet Take 1 tablet (50 mg total) by mouth every 6 (six) hours as needed.  . triamcinolone cream (KENALOG) 0.5 % Apply 1 application topically 3 (three) times daily.  Marland Kitchen triamterene-hydrochlorothiazide (DYAZIDE) 37.5-25 MG capsule Take 1 each (1 capsule total) by mouth daily.   No facility-administered encounter medications on file as of 09/12/2020.    Current Diagnosis: Patient Active Problem List   Diagnosis Date Noted  . Pure hypercholesterolemia 11/01/2019  .  Prolonged Q-T interval on ECG 10/18/2019  . 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 Type 2 diabetes mellitus with obesity ([place]) 07/11/2019  . Intrinsic eczema 07/04/2019  . CKD (chronic kidney disease) stage 3, GFR 30-59 ml/min ([facility]) 10/11/2018  . Class 3 severe obesity due to excess calories with serious comorbidity and body mass index (BMI) of 40.0 to 44.9 in adult ([place]) 09/14/2018  . Body mass index 45.0-49.9, adult ([place]) 09/14/2018  . Chronic right-sided low back pain with right-sided sciatica  09/14/2018  . Hyperlipidemia with target LDL less than 130 07/05/2018  . Mild intermittent asthma without complication 79/53/6922  . Snoring 12/14/2016  . Essential hypertension 12/14/2016  . Gastroesophageal reflux disease with esophagitis 08/26/2016  . Dysphagia 08/26/2016  . Spondylolisthesis of lumbar region 05/28/2016  . Trochanteric bursitis of left hip 02/09/2016  . Trochanteric bursitis, right hip 01/12/2016  . Fibromyalgia 08/08/2015  . Lumbar facet arthropathy 08/08/2015  . Primary osteoarthritis of both knees 08/25/2014  . Routine general medical examination at a health care facility 06/01/2013  . Encounter for screening mammogram for breast cancer 06/01/2013  . Acute bilateral low back pain with right-sided sciatica 08/29/2012  . Constipation 11/15/2011  . Abnormal electrocardiogram 02/05/2010  . ALLERGIC RHINITIS 01/26/2010    Goals Addressed   None     Follow-Up:  Pharmacist Review

## 2020-09-15 ENCOUNTER — Other Ambulatory Visit: Payer: Self-pay

## 2020-09-15 ENCOUNTER — Ambulatory Visit: Payer: Medicare Other | Admitting: Pharmacist

## 2020-09-15 DIAGNOSIS — I10 Essential (primary) hypertension: Secondary | ICD-10-CM

## 2020-09-15 DIAGNOSIS — E118 Type 2 diabetes mellitus with unspecified complications: Secondary | ICD-10-CM

## 2020-09-15 DIAGNOSIS — E785 Hyperlipidemia, unspecified: Secondary | ICD-10-CM

## 2020-09-15 NOTE — Chronic Care Management (AMB) (Signed)
Chronic Care Management Pharmacy  Name: [name]  MRN: [id number] DOB: [birth year]   Chief Complaint/ HPI  [name],  59 y.o. , female presents for their Follow-Up CCM visit with the clinical pharmacist via telephone due to COVID-19 Pandemic.  PCP : Crystal Lima, MD Patient Care Team: Crystal Lima, MD as PCP - General Buford Dresser, MD as PCP - Cardiology (Cardiology) Kennith Gain, MD as Consulting Physician (Allergy) Kennith Gain, MD as Consulting Physician (Allergy) Magnus Sinning, MD as Consulting Physician (Physical Medicine and Rehabilitation) Charlton Haws, [facility] as Pharmacist (Pharmacist)  Their chronic conditions include: Hypertension, Hyperlipidemia, Diabetes, GERD, Asthma, Chronic Kidney Disease, Osteoarthritis and Obesity, Chronic Pain, Allergies  Pt is Involved in church, exercises with groups there frequently. Pt has 14 grandchildren, age 63-21 all local, she babysits every few weeks. Married 16 years.   Office Visits: 04/17/20 Dr Ronnald Ramp OV: BP controlled but w/ prerenal azotemia - consolidate BP regimen into monotherapy with thiazide diuretic indapamide 1.25 mg daily. Referred to pain clinic. Pt c/o side effects to indapamide so went back to triamterence/HCTZ.  Consult Visit: 10/22/19 Dr Harrell Gave (cardiology): new pt for long QT and chest pain. Manual calculation of QT is actually ok (380s). Scheduled treadmill stress test.  10/17/19 ED visit: chest pain - cardiac r/o negative, low potassium in ED, dc'd on 3 days of KCl 20 meq BID.  Allergies  Allergen Reactions  . Verapamil Swelling    Medications: Outpatient Encounter Medications as of 09/15/2020  Medication Sig  . Accu-Chek FastClix Lancets MISC USE 1  TO CHECK GLUCOSE TWICE DAILY  . albuterol (PROVENTIL HFA;VENTOLIN HFA) 108 (90 Base) MCG/ACT inhaler Inhale 2 puffs into the lungs every 4 (four) hours as needed for wheezing or shortness of breath  (cough, shortness of breath or wheezing.).  Marland Kitchen aluminum chloride (DRYSOL) 20 % external solution Apply topically at bedtime.  . blood glucose meter kit and supplies KIT Use to test blood sugar twice daily. DX: E11.8  . cyclobenzaprine (FLEXERIL) 10 MG tablet Take 1 tablet (10 mg total) by mouth 2 (two) times daily as needed for muscle spasms.  Marland Kitchen glucose blood (ACCU-CHEK GUIDE) test strip Use as instructed twice daily.  Marland Kitchen levocetirizine (XYZAL) 5 MG tablet TAKE 1 TABLET BY MOUTH EVERY EVENING  . metoprolol tartrate (LOPRESSOR) 100 MG tablet TAKE 1 TABLET 2 HR PRIOR TO CARDIAC PROCEDURE  . mometasone-formoterol (DULERA) 100-5 MCG/ACT AERO Inhale 2 puffs into the lungs 2 (two) times daily. (Patient taking differently: Inhale 2 puffs into the lungs 2 (two) times daily as needed for wheezing or shortness of breath. )  . potassium chloride (KLOR-CON) 10 MEQ tablet Take one daily as directed by MD  . rosuvastatin (CRESTOR) 20 MG tablet Take 1 tablet (20 mg total) by mouth daily.  . traMADol (ULTRAM) 50 MG tablet Take 1 tablet (50 mg total) by mouth every 6 (six) hours as needed. (Patient taking differently: Take 100 mg by mouth every 6 (six) hours as needed. )  . triamcinolone cream (KENALOG) 0.5 % Apply 1 application topically 3 (three) times daily.  Marland Kitchen triamterene-hydrochlorothiazide (DYAZIDE) 37.5-25 MG capsule Take 1 each (1 capsule total) by mouth daily.   No facility-administered encounter medications on file as of 09/15/2020.   Wt Readings from Last 3 Encounters:  04/17/20 233 lb 4 oz (105.8 kg)  10/22/19 226 lb (102.5 kg)  10/18/19 226 lb (102.5 kg)   Current Diagnosis/Assessment:    Goals Addressed  This Visit's Progress   . Pharmacy Care Plan       CARE PLAN ENTRY (see longitudinal plan of care for additional care plan information)  Current Barriers:  . Chronic Disease Management support, education, and care coordination needs related to Hypertension, Hyperlipidemia, and  Diabetes   Hypertension BP Readings from Last 3 Encounters:  04/17/20 126/88  01/08/20 100/64  10/22/19 118/70 .  Pharmacist Clinical Goal(s): o Over the next 180 days, patient will work with PharmD and providers to maintain BP goal <130/80 . Current regimen:  o Triamterence-HCTZ 37.5-25 mg daily . Interventions: o Discussed BP goals and benefits of medication for prevention of heart attack / stroke . Patient self care activities - Over the next 180 days, patient will: o Check BP as needed, document, and provide at future appointments o Ensure daily salt intake < 2000 mg/day  Hyperlipidemia Lab Results  Component Value Date/Time   LDLCALC 124 (H) 07/11/2019 09:04 AM .  Pharmacist Clinical Goal(s): o Over the next 180 days, patient will work with PharmD and providers to achieve LDL goal < 100 . Current regimen:  o Rosuvastatin 20 mg daily . Interventions: o Discussed Cholesterol goals and benefits of medication for prevention of heart attack / stroke . Patient self care activities - Over the next 180 days, patient will: o Continue medication as prescribed o Continue low cholesterol diet (avoid fried foods, red meat)  Diabetes Lab Results  Component Value Date/Time   HGBA1C 6.6 (H) 04/17/2020 10:43 AM   HGBA1C 6.3 10/18/2019 04:18 PM .  Pharmacist Clinical Goal(s): o Over the next 180 days, patient will work with PharmD and providers to maintain A1c goal <7% . Current regimen:  o No medications . Interventions: o Discussed benefits of healthy diet for prevention of diabetes complications o Discussed sugar content in various drinks - juices, sodas high in sugar . Patient self care activities - Over the next 180 days, patient will: o Switch to diet/low sugar drinks o Limit sweets and high-carb foods (see attached)  Medication management . Pharmacist Clinical Goal(s): o Over the next 180 days, patient will work with PharmD and providers to maintain optimal medication  adherence . Current pharmacy: [facility] . Interventions o Comprehensive medication review performed. o Continue current medication management strategy . Patient self care activities - Over the next 180 days, patient will: o Focus on medication adherence by fill date o Take medications as prescribed o Report any questions or concerns to PharmD and/or provider(s)  Please see past updates related to this goal by clicking on the "Past Updates" button in the selected goal        Hypertension   BP goal is:  <130/80  Office blood pressures are  BP Readings from Last 3 Encounters:  04/17/20 126/88  01/08/20 100/64  10/22/19 118/70   Kidney Function Lab Results  Component Value Date/Time   CREATININE 1.51 (H) 04/17/2020 10:43 AM   CREATININE 1.50 (H) 01/08/2020 03:36 PM   GFR 42.71 (L) 04/17/2020 10:43 AM   GFRNONAA 40 (L) 10/17/2019 09:58 AM   GFRAA 46 (L) 10/17/2019 09:58 AM   K 3.5 04/17/2020 10:43 AM   K 3.6 10/18/2019 04:18 PM   Patient checks BP at home infrequently Patient home BP readings are ranging: n/a  Patient has failed these meds in the past: chlorthalidone, furosemide, Bystolic, spironolactone, verapamil Patient is currently controlled on the following medications:  . Triamterence-HCTZ 37.5-25 mg daily  We discussed diet and exercise extensively; low salt diet -  counseled to check sodium content of packaged foods and avoid adding excess salt to prepared foods.  Plan  Continue current medications and control with diet and exercise    Hyperlipidemia   LDL goal < 100  Lipid Panel     Component Value Date/Time   CHOL 197 07/11/2019 0904   TRIG 113.0 07/11/2019 0904   HDL 50.90 07/11/2019 0904   LDLCALC 124 (H) 07/11/2019 0904    Hepatic Function Latest Ref Rng & Units 08/09/2019 06/21/2019 12/14/2016  Total Protein 6.5 - 8.1 g/dL 8.6(H) - 7.8  Albumin 3.5 - 5.0 g/dL 3.9 4.2 4.0  AST 15 - 41 U/L 26 - 18  ALT 0 - 44 U/L 25 - 15  Alk Phosphatase 38 - 126  U/L 130(H) - 132(H)  Total Bilirubin 0.3 - 1.2 mg/dL 0.6 - 0.3  Bilirubin, Direct 0.0 - 0.3 mg/dL - - -    The 10-year ASCVD risk score Mikey Bussing DC Jr., et al., 2013) is: 14.5%   Values used to calculate the score:     Age: 51 years     Sex: Female     Is Non-Hispanic African American: Yes     Diabetic: Yes     Tobacco smoker: No     Systolic Blood Pressure: 342 mmHg     Is BP treated: Yes     HDL Cholesterol: 50.9 mg/dL     Total Cholesterol: 197 mg/dL   Patient has failed these meds in past: n/a Patient is currently controlled on the following medications:  . Rosuvastatin 20 mg daily  We discussed:  diet and exercise extensively; cholesterol goals; statin was started after most recent lipid panel, next panel will reflect LDL on a statin. Pt has cut back on fried foods, red meat.  Plan  Continue current medications and control with diet and exercise   Diabetes   A1c goal <7%  Recent Relevant Labs: Lab Results  Component Value Date/Time   HGBA1C 6.6 (H) 04/17/2020 10:43 AM   HGBA1C 6.3 10/18/2019 04:18 PM   GFR 42.71 (L) 04/17/2020 10:43 AM   GFR 47.86 (L) 10/18/2019 04:18 PM   MICROALBUR <0.7 10/18/2019 04:18 PM    Last diabetic Eye exam: No results found for: HMDIABEYEEXA  Last diabetic Foot exam: No results found for: HMDIABFOOTEX   Patient has failed these meds in past: n/a Patient is currently uncontrolled on the following medications: . No medications  We discussed: diet and exercise extensively; pt is working on choosing healthier options  Plan  Continue control with diet and exercise   Asthma / Allergies   Last spirometry score: n/a  Patient has failed these meds in past: n/a Patient is currently controlled on the following medications:  . Dulera (mometasone-formoterol) 100-5 mcg 2 puffs BID . Albuterol HFA PRN . Levocetirizine 5 mg qPM  Using maintenance inhaler regularly? No Frequency of rescue inhaler use:  never  We discussed:  Pt does not  use Dulera often, allergies flare up in Spring   Plan  Continue current medications  Chronic pain   Per pain mgmt - Dr Vira Blanco Osteoarthritis (knees) Lumbar facet arthropathy/Spondylolisthesis R sided sciatica Bursitis  Patient has failed these meds in past: n/a Patient is currently uncontrolled on the following medications:  . Tramadol 50 mg - 2 tab QID #240/month . Cyclobenzaprine 10 mg BID prn  We discussed: Pt is seeing pain management and reports she is getting "radiation" therapy for pain. She reports this is helping and she does not  need as much tramadol - typically takes 1 tablet every 6-8 hrs.  Plan  Continue current medications  Medication Management   Pt uses [facility] for all medications Uses pill box? No - prefers bottles Pt endorses 99% compliance  We discussed: [facility] preferred [facility] with insurance plan; pt is satisfied with services  Plan  Continue current medication management strategy    Follow up: 6 month phone visit  Charlene Brooke, PharmD, BCACP Clinical Pharmacist [facility] at [facility] [phone]

## 2020-09-15 NOTE — Patient Instructions (Signed)
Visit Information  Phone number for Pharmacist: [phone]  Goals Addressed            This Visit's Progress   . Pharmacy Care Plan       CARE PLAN ENTRY (see longitudinal plan of care for additional care plan information)  Current Barriers:  . Chronic Disease Management support, education, and care coordination needs related to Hypertension, Hyperlipidemia, and Diabetes   Hypertension BP Readings from Last 3 Encounters:  04/17/20 126/88  01/08/20 100/64  10/22/19 118/70 .  Pharmacist Clinical Goal(s): o Over the next 180 days, patient will work with PharmD and providers to maintain BP goal <130/80 . Current regimen:  o Triamterence-HCTZ 37.5-25 mg daily . Interventions: o Discussed BP goals and benefits of medication for prevention of heart attack / stroke . Patient self care activities - Over the next 180 days, patient will: o Check BP as needed, document, and provide at future appointments o Ensure daily salt intake < 2000 mg/day  Hyperlipidemia Lab Results  Component Value Date/Time   LDLCALC 124 (H) 07/11/2019 09:04 AM .  Pharmacist Clinical Goal(s): o Over the next 180 days, patient will work with PharmD and providers to achieve LDL goal < 100 . Current regimen:  o Rosuvastatin 20 mg daily . Interventions: o Discussed Cholesterol goals and benefits of medication for prevention of heart attack / stroke . Patient self care activities - Over the next 180 days, patient will: o Continue medication as prescribed o Continue low cholesterol diet (avoid fried foods, red meat)  Diabetes Lab Results  Component Value Date/Time   HGBA1C 6.6 (H) 04/17/2020 10:43 AM   HGBA1C 6.3 10/18/2019 04:18 PM .  Pharmacist Clinical Goal(s): o Over the next 180 days, patient will work with PharmD and providers to maintain A1c goal <7% . Current regimen:  o No medications . Interventions: o Discussed benefits of healthy diet for prevention of diabetes complications o Discussed  sugar content in various drinks - juices, sodas high in sugar . Patient self care activities - Over the next 180 days, patient will: o Switch to diet/low sugar drinks o Limit sweets and high-carb foods (see attached)  Medication management . Pharmacist Clinical Goal(s): o Over the next 180 days, patient will work with PharmD and providers to maintain optimal medication adherence . Current pharmacy: [facility] . Interventions o Comprehensive medication review performed. o Continue current medication management strategy . Patient self care activities - Over the next 180 days, patient will: o Focus on medication adherence by fill date o Take medications as prescribed o Report any questions or concerns to PharmD and/or provider(s)  Please see past updates related to this goal by clicking on the "Past Updates" button in the selected goal       Patient verbalizes understanding of instructions provided today.  The pharmacy team will reach out to the patient again over the next 180 days.   Charlene Brooke, PharmD, BCACP Clinical Pharmacist [facility] at [facility] [phone]

## 2020-09-17 ENCOUNTER — Ambulatory Visit (INDEPENDENT_AMBULATORY_CARE_PROVIDER_SITE_OTHER): Payer: Medicare Other | Admitting: Internal Medicine

## 2020-09-17 ENCOUNTER — Other Ambulatory Visit: Payer: Self-pay

## 2020-09-17 ENCOUNTER — Encounter: Payer: Self-pay | Admitting: Internal Medicine

## 2020-09-17 VITALS — BP 142/102 | HR 90 | Temp 98.2°F | Resp 16 | Ht 61.0 in | Wt 235.0 lb

## 2020-09-17 DIAGNOSIS — E785 Hyperlipidemia, unspecified: Secondary | ICD-10-CM

## 2020-09-17 DIAGNOSIS — E7800 Pure hypercholesterolemia, unspecified: Secondary | ICD-10-CM

## 2020-09-17 DIAGNOSIS — Z23 Encounter for immunization: Secondary | ICD-10-CM | POA: Diagnosis not present

## 2020-09-17 DIAGNOSIS — N183 Chronic kidney disease, stage 3 unspecified: Secondary | ICD-10-CM | POA: Diagnosis not present

## 2020-09-17 DIAGNOSIS — E669 Obesity, unspecified: Secondary | ICD-10-CM | POA: Diagnosis not present

## 2020-09-17 DIAGNOSIS — E1169 Type 2 diabetes mellitus with other specified complication: Secondary | ICD-10-CM

## 2020-09-17 DIAGNOSIS — I10 Essential (primary) hypertension: Secondary | ICD-10-CM

## 2020-09-17 DIAGNOSIS — H6982 Other specified disorders of Eustachian tube, left ear: Secondary | ICD-10-CM

## 2020-09-17 DIAGNOSIS — L2084 Intrinsic (allergic) eczema: Secondary | ICD-10-CM

## 2020-09-17 LAB — CBC WITH DIFFERENTIAL/PLATELET
Basophils Absolute: 0 10*3/uL (ref 0.0–0.1)
Basophils Relative: 0.6 % (ref 0.0–3.0)
Eosinophils Absolute: 0.2 10*3/uL (ref 0.0–0.7)
Eosinophils Relative: 2.5 % (ref 0.0–5.0)
HCT: 42.3 % (ref 36.0–46.0)
Hemoglobin: 13.7 g/dL (ref 12.0–15.0)
Lymphocytes Relative: 41 % (ref 12.0–46.0)
Lymphs Abs: 2.5 10*3/uL (ref 0.7–4.0)
MCHC: 32.3 g/dL (ref 30.0–36.0)
MCV: 81.8 fl (ref 78.0–100.0)
Monocytes Absolute: 0.7 10*3/uL (ref 0.1–1.0)
Monocytes Relative: 10.9 % (ref 3.0–12.0)
Neutro Abs: 2.8 10*3/uL (ref 1.4–7.7)
Neutrophils Relative %: 45 % (ref 43.0–77.0)
Platelets: 197 10*3/uL (ref 150.0–400.0)
RBC: 5.18 Mil/uL — ABNORMAL HIGH (ref 3.87–5.11)
RDW: 17.3 % — ABNORMAL HIGH (ref 11.5–15.5)
WBC: 6.1 10*3/uL (ref 4.0–10.5)

## 2020-09-17 LAB — HEPATIC FUNCTION PANEL
ALT: 17 U/L (ref 0–35)
AST: 18 U/L (ref 0–37)
Albumin: 4 g/dL (ref 3.5–5.2)
Alkaline Phosphatase: 127 U/L — ABNORMAL HIGH (ref 39–117)
Bilirubin, Direct: 0 mg/dL (ref 0.0–0.3)
Total Bilirubin: 0.3 mg/dL (ref 0.2–1.2)
Total Protein: 7.5 g/dL (ref 6.0–8.3)

## 2020-09-17 LAB — LIPID PANEL
Cholesterol: 122 mg/dL (ref 0–200)
HDL: 48.9 mg/dL (ref >39.00)
LDL Cholesterol: 57 mg/dL (ref 0–99)
NonHDL: 72.65
Total CHOL/HDL Ratio: 2
Triglycerides: 78 mg/dL (ref 0.0–149.0)
VLDL: 15.6 mg/dL (ref 0.0–40.0)

## 2020-09-17 LAB — BASIC METABOLIC PANEL
BUN: 29 mg/dL — ABNORMAL HIGH (ref 6–23)
CO2: 34 mEq/L — ABNORMAL HIGH (ref 19–32)
Calcium: 9.6 mg/dL (ref 8.4–10.5)
Chloride: 99 mEq/L (ref 96–112)
Creatinine, Ser: 1.86 mg/dL — ABNORMAL HIGH (ref 0.40–1.20)
GFR: 29.34 mL/min — ABNORMAL LOW (ref >60.00)
Glucose, Bld: 96 mg/dL (ref 70–99)
Potassium: 4 mEq/L (ref 3.5–5.1)
Sodium: 139 mEq/L (ref 135–145)

## 2020-09-17 LAB — HEMOGLOBIN A1C: Hgb A1c MFr Bld: 6.7 % — ABNORMAL HIGH (ref 4.6–6.5)

## 2020-09-17 LAB — TSH: TSH: 0.94 u[IU]/mL (ref 0.35–4.50)

## 2020-09-17 MED ORDER — NEBIVOLOL HCL 10 MG PO TABS: 10.0000 mg | ORAL_TABLET | Freq: Every day | ORAL | 1 refills | Status: DC

## 2020-09-17 MED ORDER — METHYLPREDNISOLONE 4 MG PO TBPK: ORAL_TABLET | ORAL | 0 refills | Status: AC

## 2020-09-17 MED ORDER — LEVOCETIRIZINE DIHYDROCHLORIDE 5 MG PO TABS: 5.0000 mg | ORAL_TABLET | Freq: Every evening | ORAL | 1 refills | Status: DC

## 2020-09-17 NOTE — Progress Notes (Signed)
Subjective:  Patient ID: [name], female    DOB: [birth year]  Age: 59 y.o. MRN: [id number]  CC: Annual Exam, Hypertension, Hyperlipidemia, and Diabetes  This visit occurred during the SARS-CoV-2 public health emergency.  Safety protocols were in place, including screening questions prior to the visit, additional usage of staff PPE, and extensive cleaning of exam room while observing appropriate contact time as indicated for disinfecting solutions.    HPI [name] presents for f/up -  1.  She complains of a 1 week history and week history of popping, crackling, and pressure sensation in her left ear.  Her hearing has been normal.  She denies headache, rash, lymphadenopathy, dizziness, or vertigo.  2.  She has not been monitoring her blood pressure.  She is not exercising because she is wearing a back brace for the management of chronic low back pain.  She denies headache, blurred vision, chest pain, shortness of breath, or edema.  She tells me she is taking the combination of triamterene and hydrochlorothiazide.  Outpatient Medications Prior to Visit  Medication Sig Dispense Refill  . Accu-Chek FastClix Lancets MISC USE 1  TO CHECK GLUCOSE TWICE DAILY 102 each 3  . albuterol (PROVENTIL HFA;VENTOLIN HFA) 108 (90 Base) MCG/ACT inhaler Inhale 2 puffs into the lungs every 4 (four) hours as needed for wheezing or shortness of breath (cough, shortness of breath or wheezing.). 1 Inhaler 1  . aluminum chloride (DRYSOL) 20 % external solution Apply topically at bedtime. 60 mL 3  . blood glucose meter kit and supplies KIT Use to test blood sugar twice daily. DX: E11.8 1 each 0  . cyclobenzaprine (FLEXERIL) 10 MG tablet Take 1 tablet (10 mg total) by mouth 2 (two) times daily as needed for muscle spasms. 20 tablet 0  . glucose blood (ACCU-CHEK GUIDE) test strip Use as instructed twice daily. 300 each 1  . mometasone-formoterol (DULERA) 100-5 MCG/ACT AERO Inhale 2 puffs into the lungs 2  (two) times daily. (Patient taking differently: Inhale 2 puffs into the lungs 2 (two) times daily as needed for wheezing or shortness of breath. ) 13 g 11  . potassium chloride (KLOR-CON) 10 MEQ tablet Take one daily as directed by MD 90 tablet 1  . rosuvastatin (CRESTOR) 20 MG tablet Take 1 tablet (20 mg total) by mouth daily. 90 tablet 1  . traMADol (ULTRAM) 50 MG tablet Take 1 tablet (50 mg total) by mouth every 6 (six) hours as needed. (Patient taking differently: Take 100 mg by mouth every 6 (six) hours as needed. ) 60 tablet 3  . triamcinolone cream (KENALOG) 0.5 % Apply 1 application topically 3 (three) times daily. 454 g 1  . triamterene-hydrochlorothiazide (DYAZIDE) 37.5-25 MG capsule Take 1 each (1 capsule total) by mouth daily. 90 capsule 1  . levocetirizine (XYZAL) 5 MG tablet TAKE 1 TABLET BY MOUTH EVERY EVENING 90 tablet 1  . metoprolol tartrate (LOPRESSOR) 100 MG tablet TAKE 1 TABLET 2 HR PRIOR TO CARDIAC PROCEDURE 1 tablet 0   No facility-administered medications prior to visit.    ROS Review of Systems  Objective:  BP (!) 142/102 Comment: BP (R) 142/102 (L) 144/96  Pulse 90   Temp 98.2 F (36.8 C) (Oral)   Resp 16   Ht _0  (1.549 m)   Wt 235 lb (106.6 kg)   LMP 05/17/2012   SpO2 96%   BMI 44.40 kg/m   BP Readings from Last 3 Encounters:  09/17/20 (!) 142/102  04/17/20  126/88  01/08/20 100/64    Wt Readings from Last 3 Encounters:  09/17/20 235 lb (106.6 kg)  04/17/20 233 lb 4 oz (105.8 kg)  10/22/19 226 lb (102.5 kg)    Physical Exam Constitutional:      General: She is not in acute distress.    Appearance: She is obese. She is ill-appearing (wearing a back brace). She is not toxic-appearing or diaphoretic.  HENT:     Right Ear: Hearing, tympanic membrane, ear canal and external ear normal. Tympanic membrane is not injected.     Left Ear: Hearing, tympanic membrane, ear canal and external ear normal. Tympanic membrane is not injected.     Nose: Nose  normal.     Mouth/Throat:     Mouth: Mucous membranes are moist.  Eyes:     General: No scleral icterus.    Conjunctiva/sclera: Conjunctivae normal.  Cardiovascular:     Rate and Rhythm: Normal rate and regular rhythm.     Heart sounds: No murmur heard.   Pulmonary:     Effort: Pulmonary effort is normal.     Breath sounds: No wheezing, rhonchi or rales.  Abdominal:     General: Abdomen is protuberant. Bowel sounds are normal. There is no distension.     Palpations: Abdomen is soft. There is no hepatomegaly, splenomegaly or mass.     Tenderness: There is no abdominal tenderness.     Hernia: No hernia is present.  Musculoskeletal:        General: Normal range of motion.     Cervical back: Neck supple.     Right lower leg: No edema.     Left lower leg: No edema.  Lymphadenopathy:     Cervical: No cervical adenopathy.  Skin:    General: Skin is warm and dry.     Findings: No rash.  Neurological:     General: No focal deficit present.     Mental Status: She is alert.  Psychiatric:        Mood and Affect: Mood normal.        Behavior: Behavior normal.     Lab Results  Component Value Date   WBC 6.1 09/17/2020   HGB 13.7 09/17/2020   HCT 42.3 09/17/2020   PLT 197.0 09/17/2020   GLUCOSE 96 09/17/2020   CHOL 122 09/17/2020   TRIG 78.0 09/17/2020   HDL 48.90 09/17/2020   LDLCALC 57 09/17/2020   ALT 17 09/17/2020   AST 18 09/17/2020   NA 139 09/17/2020   K 4.0 09/17/2020   CL 99 09/17/2020   CREATININE 1.86 (H) 09/17/2020   BUN 29 (H) 09/17/2020   CO2 34 (H) 09/17/2020   TSH 0.94 09/17/2020   HGBA1C 6.7 (H) 09/17/2020   MICROALBUR 1.0 09/17/2020    DG HIP UNILAT WITH PELVIS 2-3 VIEWS LEFT  Result Date: 06/21/2020 CLINICAL DATA:  Left hip pain, no known injury, initial encounter EXAM: DG HIP (WITH OR WITHOUT PELVIS) 2V LEFT COMPARISON:  None. FINDINGS: Mild degenerative changes of the hip joint are noted. No acute fracture or dislocation is noted. IMPRESSION: Mild  degenerative change without acute abnormality. Electronically Signed   By: Inez Catalina M.D.   On: 06/21/2020 04:20    Assessment & Plan:   Calista was seen today for annual exam, hypertension, hyperlipidemia and diabetes.  Diagnoses and all orders for this visit:  Essential hypertension- Sshe has not achieved her blood pressure goal of 130/80.  I recommended that she add nebivolol to  the diuretics.  She will start taking an SGLT2 inhibitor and I anticipate this will also help her achieve her blood pressure goal.  She was encouraged to improve her lifestyle modifications. -     CBC with Differential/Platelet; Future -     Basic metabolic panel; Future -     nebivolol (BYSTOLIC) 10 MG tablet; Take 1 tablet (10 mg total) by mouth daily. -     Basic metabolic panel -     CBC with Differential/Platelet  Type 2 diabetes mellitus with obesity ([place])- Her A1c is 6.7% and she has renal complications.  I recommended that she start taking dapaglifozin. -     Basic metabolic panel; Future -     Hemoglobin A1c; Future -     Microalbumin / creatinine urine ratio; Future -     Microalbumin / creatinine urine ratio -     Hemoglobin A1c -     Basic metabolic panel -     dapagliflozin propanediol (FARXIGA) 10 MG TABS tablet; Take 1 tablet (10 mg total) by mouth daily before breakfast.  Stage 3 chronic kidney disease, unspecified whether stage 3a or 3b CKD ([place])- See above.  She will avoid nephrotoxic agents.  Will try to get better control of her blood sugar and her blood pressure. -     Basic metabolic panel; Future -     Urinalysis, Routine w reflex microscopic; Future -     Urinalysis, Routine w reflex microscopic -     Basic metabolic panel  Pure hypercholesterolemia  Hyperlipidemia with target LDL less than 130- She has achieved her LDL goal and is doing well on the statin. -     Lipid panel; Future -     TSH; Future -     Hepatic function panel; Future -     Hepatic function panel -      TSH -     Lipid panel  Eustachian tube dysfunction, left- She will need to avoid decongestants.  Will treat with an antihistamine and a 6-day course of methylprednisolone. -     levocetirizine (XYZAL) 5 MG tablet; Take 1 tablet (5 mg total) by mouth every evening. -     methylPREDNISolone (MEDROL DOSEPAK) 4 MG TBPK tablet; TAKE AS DIRECTED  Intrinsic eczema -     levocetirizine (XYZAL) 5 MG tablet; Take 1 tablet (5 mg total) by mouth every evening.  Other orders -     Flu Vaccine QUAD 6+ mos PF IM (Fluarix Quad PF)   I have discontinued Kavitha P. Wahlquist's metoprolol tartrate. I have also changed her levocetirizine. Additionally, I am having her start on methylPREDNISolone, nebivolol, and dapagliflozin propanediol. Lastly, I am having her maintain her aluminum chloride, albuterol, mometasone-formoterol, triamcinolone cream, blood glucose meter kit and supplies, cyclobenzaprine, potassium chloride, Accu-Chek FastClix Lancets, Accu-Chek Guide, traMADol, triamterene-hydrochlorothiazide, and rosuvastatin.  Meds ordered this encounter  Medications  . levocetirizine (XYZAL) 5 MG tablet    Sig: Take 1 tablet (5 mg total) by mouth every evening.    Dispense:  90 tablet    Refill:  1  . methylPREDNISolone (MEDROL DOSEPAK) 4 MG TBPK tablet    Sig: TAKE AS DIRECTED    Dispense:  21 tablet    Refill:  0  . nebivolol (BYSTOLIC) 10 MG tablet    Sig: Take 1 tablet (10 mg total) by mouth daily.    Dispense:  90 tablet    Refill:  1  . dapagliflozin propanediol (FARXIGA) 10 MG TABS  tablet    Sig: Take 1 tablet (10 mg total) by mouth daily before breakfast.    Dispense:  90 tablet    Refill:  1   I spent 50 minutes in preparing to see the patient by review of recent labs, imaging and procedures, obtaining and reviewing separately obtained history, communicating with the patient and family or caregiver, ordering medications, tests or procedures, and documenting clinical information in the EHR  including the differential Dx, treatment, and any further evaluation and other management of 1. Essential hypertension 2. Type 2 diabetes mellitus with obesity (HCC) 3. Stage 3 chronic kidney disease, unspecified whether stage 3a or 3b CKD ([place]) 4. Hyperlipidemia with target LDL less than 130 5. Eustachian tube dysfunction, left 6. Intrinsic eczema     Follow-up: Return in about 3 months (around 12/18/2020).  [name], MD

## 2020-09-17 NOTE — Patient Instructions (Signed)

## 2020-09-18 ENCOUNTER — Telehealth: Payer: Self-pay | Admitting: Internal Medicine

## 2020-09-18 ENCOUNTER — Encounter: Payer: Self-pay | Admitting: Internal Medicine

## 2020-09-18 LAB — URINALYSIS, ROUTINE W REFLEX MICROSCOPIC
Bilirubin Urine: NEGATIVE
Hgb urine dipstick: NEGATIVE
Ketones, ur: NEGATIVE
Leukocytes,Ua: NEGATIVE
Nitrite: NEGATIVE
RBC / HPF: NONE SEEN (ref 0–?)
Specific Gravity, Urine: 1.02 (ref 1.000–1.030)
Total Protein, Urine: NEGATIVE
Urine Glucose: NEGATIVE
Urobilinogen, UA: 1 (ref 0.0–1.0)
pH: 6 (ref 5.0–8.0)

## 2020-09-18 LAB — MICROALBUMIN / CREATININE URINE RATIO
Creatinine,U: 119.7 mg/dL
Microalb Creat Ratio: 0.8 mg/g (ref 0.0–30.0)
Microalb, Ur: 1 mg/dL (ref 0.0–1.9)

## 2020-09-18 MED ORDER — DAPAGLIFLOZIN PROPANEDIOL 10 MG PO TABS: 10.0000 mg | ORAL_TABLET | Freq: Every day | ORAL | 1 refills | Status: DC

## 2020-09-18 NOTE — Telephone Encounter (Signed)
  Dapagliflozin Propanediol  Patient states she went to the pharmacy and it was over 100 dollars and she is not able to afford this.

## 2020-09-24 DIAGNOSIS — M47817 Spondylosis without myelopathy or radiculopathy, lumbosacral region: Secondary | ICD-10-CM | POA: Diagnosis not present

## 2020-09-25 NOTE — Progress Notes (Signed)
Patient has [employer] and reports copay for [facility] [place]ade is cost prohibitive at this time.  Reviewed [place] process for [place]. Patient meets income/out of pocket spend criteria for the program. Patient will provide proof of income, out of pocket spend report, and will sign application. Will collaborate with prescriber Dr Ronnald Ramp for the provider portion of application. Once completed, application will be submitted via Fax   Patient assistance program Fax number: [phone]  Patient will come to office to sign application.  Charlton Haws, [facility]

## 2020-10-07 ENCOUNTER — Telehealth: Payer: Self-pay | Admitting: Pharmacist

## 2020-10-07 NOTE — Progress Notes (Signed)
Chronic Care Management Pharmacy Assistant   Name: [name]  MRN: [id number] DOB: 08-15-1961  Reason for Encounter: Chart Review   PCP : [name], MD  Allergies:   Allergies  Allergen Reactions  . Verapamil Swelling    Medications: Outpatient Encounter Medications as of 10/07/2020  Medication Sig  . Accu-Chek FastClix Lancets MISC USE 1  TO CHECK GLUCOSE TWICE DAILY  . albuterol (PROVENTIL HFA;VENTOLIN HFA) 108 (90 Base) MCG/ACT inhaler Inhale 2 puffs into the lungs every 4 (four) hours as needed for wheezing or shortness of breath (cough, shortness of breath or wheezing.).  Marland Kitchen aluminum chloride (DRYSOL) 20 % external solution Apply topically at bedtime.  . blood glucose meter kit and supplies KIT Use to test blood sugar twice daily. DX: E11.8  . cyclobenzaprine (FLEXERIL) 10 MG tablet Take 1 tablet (10 mg total) by mouth 2 (two) times daily as needed for muscle spasms.  . dapagliflozin propanediol (FARXIGA) 10 MG TABS tablet Take 1 tablet (10 mg total) by mouth daily before breakfast.  . glucose blood (ACCU-CHEK GUIDE) test strip Use as instructed twice daily.  Marland Kitchen levocetirizine (XYZAL) 5 MG tablet Take 1 tablet (5 mg total) by mouth every evening.  . mometasone-formoterol (DULERA) 100-5 MCG/ACT AERO Inhale 2 puffs into the lungs 2 (two) times daily. (Patient taking differently: Inhale 2 puffs into the lungs 2 (two) times daily as needed for wheezing or shortness of breath. )  . nebivolol (BYSTOLIC) 10 MG tablet Take 1 tablet (10 mg total) by mouth daily.  . potassium chloride (KLOR-CON) 10 MEQ tablet Take one daily as directed by MD  . rosuvastatin (CRESTOR) 20 MG tablet Take 1 tablet (20 mg total) by mouth daily.  . traMADol (ULTRAM) 50 MG tablet Take 1 tablet (50 mg total) by mouth every 6 (six) hours as needed. (Patient taking differently: Take 100 mg by mouth every 6 (six) hours as needed. )  . triamcinolone cream (KENALOG) 0.5 % Apply 1 application topically 3  (three) times daily.  Marland Kitchen triamterene-hydrochlorothiazide (DYAZIDE) 37.5-25 MG capsule Take 1 each (1 capsule total) by mouth daily.   No facility-administered encounter medications on file as of 10/07/2020.    Current Diagnosis: Patient Active Problem List   Diagnosis Date Noted  . Eustachian tube dysfunction, left 09/17/2020  . Prolonged Q-T interval on ECG 10/18/2019  . 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 Type 2 diabetes mellitus with obesity ([place]) 07/11/2019  . Intrinsic eczema 07/04/2019  . CKD (chronic kidney disease) stage 3, GFR 30-59 ml/min (HCC) 10/11/2018  . Class 3 severe obesity due to excess calories with serious comorbidity and body mass index (BMI) of 40.0 to 44.9 in adult ([place]) 09/14/2018  . Body mass index 45.0-49.9, adult ([place]) 09/14/2018  . Chronic right-sided low back pain with right-sided sciatica 09/14/2018  . Hyperlipidemia with target LDL less than 130 07/05/2018  . Mild intermittent asthma without complication 96/22/2979  . Snoring 12/14/2016  . Essential hypertension 12/14/2016  . Gastroesophageal reflux disease with esophagitis 08/26/2016  . Dysphagia 08/26/2016  . Spondylolisthesis of lumbar region 05/28/2016  . Trochanteric bursitis of left hip 02/09/2016  . Trochanteric bursitis, right hip 01/12/2016  . Fibromyalgia 08/08/2015  . Lumbar facet arthropathy 08/08/2015  . Primary osteoarthritis of both knees 08/25/2014  . Routine general medical examination at a health care facility 06/01/2013  . Encounter for screening mammogram for breast cancer 06/01/2013  . Abnormal electrocardiogram 02/05/2010  . ALLERGIC RHINITIS 01/26/2010    Goals  Addressed   None     Follow-Up:  Pharmacist Review    Wendy Poet, [place]

## 2020-10-22 ENCOUNTER — Telehealth: Payer: Self-pay | Admitting: Internal Medicine

## 2020-10-22 DIAGNOSIS — T50905A Adverse effect of unspecified drugs, medicaments and biological substances, initial encounter: Secondary | ICD-10-CM

## 2020-10-22 DIAGNOSIS — I10 Essential (primary) hypertension: Secondary | ICD-10-CM

## 2020-10-22 MED ORDER — TRIAMTERENE-HCTZ 37.5-25 MG PO CAPS: 1.0000 | ORAL_CAPSULE | Freq: Every day | ORAL | 1 refills | Status: DC

## 2020-10-22 NOTE — Telephone Encounter (Signed)
° ° °  Patient requesting refill on triamterene-hydrochlorothiazide (DYAZIDE) 37.5-25 MG capsule Pharmacy:[id number] - [place] ([place]), [place] - [address]

## 2020-11-10 ENCOUNTER — Telehealth: Payer: Self-pay | Admitting: Internal Medicine

## 2020-11-10 DIAGNOSIS — L239 Allergic contact dermatitis, unspecified cause: Secondary | ICD-10-CM

## 2020-11-10 DIAGNOSIS — L2084 Intrinsic (allergic) eczema: Secondary | ICD-10-CM

## 2020-11-10 NOTE — Telephone Encounter (Signed)
    Patient requesting refill for triamcinolone cream (KENALOG) 0.5 % Pharmacy [id number] - [place] ([place]), [place] - [address]

## 2020-11-11 MED ORDER — TRIAMCINOLONE ACETONIDE 0.5 % EX CREA: 1.0000 "application " | TOPICAL_CREAM | Freq: Three times a day (TID) | CUTANEOUS | 1 refills | Status: DC

## 2020-11-14 DIAGNOSIS — G894 Chronic pain syndrome: Secondary | ICD-10-CM | POA: Diagnosis not present

## 2020-11-14 DIAGNOSIS — M5136 Other intervertebral disc degeneration, lumbar region: Secondary | ICD-10-CM | POA: Diagnosis not present

## 2020-11-14 DIAGNOSIS — M545 Low back pain, unspecified: Secondary | ICD-10-CM | POA: Diagnosis not present

## 2020-11-14 DIAGNOSIS — M47817 Spondylosis without myelopathy or radiculopathy, lumbosacral region: Secondary | ICD-10-CM | POA: Diagnosis not present

## 2020-12-09 ENCOUNTER — Telehealth: Payer: Self-pay | Admitting: Internal Medicine

## 2020-12-09 DIAGNOSIS — M4316 Spondylolisthesis, lumbar region: Secondary | ICD-10-CM

## 2020-12-09 DIAGNOSIS — M797 Fibromyalgia: Secondary | ICD-10-CM

## 2020-12-09 DIAGNOSIS — M47816 Spondylosis without myelopathy or radiculopathy, lumbar region: Secondary | ICD-10-CM

## 2020-12-09 MED ORDER — TRAMADOL HCL 50 MG PO TABS: 50.0000 mg | ORAL_TABLET | Freq: Four times a day (QID) | ORAL | 0 refills | Status: DC | PRN

## 2020-12-09 NOTE — Telephone Encounter (Signed)
traMADol Veatrice Bourbon) 50 MG tablet [place] ([place]), [place] - [address] Phone:  [phone]  Fax:  [phone]     Last seen- 11.10.22 Next apt- n/a

## 2020-12-09 NOTE — Telephone Encounter (Signed)
Done erx 

## 2020-12-09 NOTE — Addendum Note (Signed)
Addended by: Biagio Borg on: 12/09/2020 06:58 PM   Modules accepted: Orders

## 2020-12-10 DIAGNOSIS — M5459 Other low back pain: Secondary | ICD-10-CM | POA: Diagnosis not present

## 2020-12-10 DIAGNOSIS — M533 Sacrococcygeal disorders, not elsewhere classified: Secondary | ICD-10-CM | POA: Diagnosis not present

## 2020-12-10 DIAGNOSIS — Z79899 Other long term (current) drug therapy: Secondary | ICD-10-CM | POA: Diagnosis not present

## 2020-12-10 DIAGNOSIS — G894 Chronic pain syndrome: Secondary | ICD-10-CM | POA: Diagnosis not present

## 2020-12-10 DIAGNOSIS — Z79891 Long term (current) use of opiate analgesic: Secondary | ICD-10-CM | POA: Diagnosis not present

## 2020-12-10 DIAGNOSIS — M7060 Trochanteric bursitis, unspecified hip: Secondary | ICD-10-CM | POA: Diagnosis not present

## 2020-12-15 ENCOUNTER — Telehealth: Payer: Self-pay | Admitting: Pharmacist

## 2020-12-15 NOTE — Progress Notes (Signed)
Chronic Care Management Pharmacy Assistant   Name: [name]  MRN: [id number] DOB: October 24, 1961  Reason for Encounter: Hypertension Adherence Call   PCP : Crystal Lima, MD  Allergies:   Allergies  Allergen Reactions   Verapamil Swelling    Medications: Outpatient Encounter Medications as of 12/15/2020  Medication Sig   Accu-Chek FastClix Lancets MISC USE 1  TO CHECK GLUCOSE TWICE DAILY   albuterol (PROVENTIL HFA;VENTOLIN HFA) 108 (90 Base) MCG/ACT inhaler Inhale 2 puffs into the lungs every 4 (four) hours as needed for wheezing or shortness of breath (cough, shortness of breath or wheezing.).   aluminum chloride (DRYSOL) 20 % external solution Apply topically at bedtime.   blood glucose meter kit and supplies KIT Use to test blood sugar twice daily. DX: E11.8   cyclobenzaprine (FLEXERIL) 10 MG tablet Take 1 tablet (10 mg total) by mouth 2 (two) times daily as needed for muscle spasms.   dapagliflozin propanediol (FARXIGA) 10 MG TABS tablet Take 1 tablet (10 mg total) by mouth daily before breakfast.   glucose blood (ACCU-CHEK GUIDE) test strip Use as instructed twice daily.   levocetirizine (XYZAL) 5 MG tablet Take 1 tablet (5 mg total) by mouth every evening.   mometasone-formoterol (DULERA) 100-5 MCG/ACT AERO Inhale 2 puffs into the lungs 2 (two) times daily. (Patient taking differently: Inhale 2 puffs into the lungs 2 (two) times daily as needed for wheezing or shortness of breath. )   nebivolol (BYSTOLIC) 10 MG tablet Take 1 tablet (10 mg total) by mouth daily.   potassium chloride (KLOR-CON) 10 MEQ tablet Take one daily as directed by MD   rosuvastatin (CRESTOR) 20 MG tablet Take 1 tablet (20 mg total) by mouth daily.   traMADol (ULTRAM) 50 MG tablet Take 1 tablet (50 mg total) by mouth every 6 (six) hours as needed.   triamcinolone cream (KENALOG) 0.5 % Apply 1 application topically 3 (three) times daily.   triamterene-hydrochlorothiazide (DYAZIDE)  37.5-25 MG capsule Take 1 each (1 capsule total) by mouth daily.   No facility-administered encounter medications on file as of 12/15/2020.    Current Diagnosis: Patient Active Problem List   Diagnosis Date Noted   Eustachian tube dysfunction, left 09/17/2020   Prolonged Q-T interval on ECG 10/18/2019   Dermal hypersensitivity reaction 07/11/2019   Type 2 diabetes mellitus with obesity ([place]) 07/11/2019   Intrinsic eczema 07/04/2019   CKD (chronic kidney disease) stage 3, GFR 30-59 ml/min (HCC) 10/11/2018   Class 3 severe obesity due to excess calories with serious comorbidity and body mass index (BMI) of 40.0 to 44.9 in adult ([place]) 09/14/2018   Body mass index 45.0-49.9, adult ([place]) 09/14/2018   Chronic right-sided low back pain with right-sided sciatica 09/14/2018   Hyperlipidemia with target LDL less than 130 07/05/2018   Mild intermittent asthma without complication 19/41/7408   Snoring 12/14/2016   Essential hypertension 12/14/2016   Gastroesophageal reflux disease with esophagitis 08/26/2016   Dysphagia 08/26/2016   Spondylolisthesis of lumbar region 05/28/2016   Trochanteric bursitis of left hip 02/09/2016   Trochanteric bursitis, right hip 01/12/2016   Fibromyalgia 08/08/2015   Lumbar facet arthropathy 08/08/2015   Primary osteoarthritis of both knees 08/25/2014   Routine general medical examination at a health care facility 06/01/2013   Encounter for screening mammogram for breast cancer 06/01/2013   Abnormal electrocardiogram 02/05/2010   ALLERGIC RHINITIS 01/26/2010    Goals Addressed   None     Follow-Up:  Pharmacist Review  A hypertension wellness call was made to [name] today but number is no longer in service   [name], [place]

## 2020-12-17 DIAGNOSIS — M533 Sacrococcygeal disorders, not elsewhere classified: Secondary | ICD-10-CM | POA: Diagnosis not present

## 2020-12-17 DIAGNOSIS — M7060 Trochanteric bursitis, unspecified hip: Secondary | ICD-10-CM | POA: Diagnosis not present

## 2020-12-17 DIAGNOSIS — M7918 Myalgia, other site: Secondary | ICD-10-CM | POA: Diagnosis not present

## 2020-12-29 DIAGNOSIS — M4726 Other spondylosis with radiculopathy, lumbar region: Secondary | ICD-10-CM | POA: Diagnosis not present

## 2020-12-29 DIAGNOSIS — G894 Chronic pain syndrome: Secondary | ICD-10-CM | POA: Diagnosis not present

## 2020-12-29 DIAGNOSIS — M7060 Trochanteric bursitis, unspecified hip: Secondary | ICD-10-CM | POA: Diagnosis not present

## 2020-12-29 DIAGNOSIS — M533 Sacrococcygeal disorders, not elsewhere classified: Secondary | ICD-10-CM | POA: Diagnosis not present

## 2020-12-31 ENCOUNTER — Telehealth: Payer: Self-pay | Admitting: Pharmacist

## 2020-12-31 NOTE — Progress Notes (Signed)
   Call was made to [employer] to check on the status of patient assistant application for [place]. Spoke with representative Alvie Heidelberg who processed the application on the phone. Application was approved through 11/07/21. Approval letter to be mailed to patient and faxed to Dr. Ronnald Ramp office. Refills on auto ship and will be shipped every 90 days to patient address.   Wendy Poet, Clinical Pharmacist  [facility] [phone]   Time spent:40

## 2021-01-07 DIAGNOSIS — M4726 Other spondylosis with radiculopathy, lumbar region: Secondary | ICD-10-CM | POA: Diagnosis not present

## 2021-02-04 DIAGNOSIS — M7060 Trochanteric bursitis, unspecified hip: Secondary | ICD-10-CM | POA: Diagnosis not present

## 2021-02-04 DIAGNOSIS — M545 Low back pain, unspecified: Secondary | ICD-10-CM | POA: Diagnosis not present

## 2021-02-04 DIAGNOSIS — Z79899 Other long term (current) drug therapy: Secondary | ICD-10-CM | POA: Diagnosis not present

## 2021-02-04 DIAGNOSIS — M4726 Other spondylosis with radiculopathy, lumbar region: Secondary | ICD-10-CM | POA: Diagnosis not present

## 2021-02-04 DIAGNOSIS — Z79891 Long term (current) use of opiate analgesic: Secondary | ICD-10-CM | POA: Diagnosis not present

## 2021-02-04 DIAGNOSIS — G894 Chronic pain syndrome: Secondary | ICD-10-CM | POA: Diagnosis not present

## 2021-02-09 ENCOUNTER — Other Ambulatory Visit: Payer: Self-pay | Admitting: Physician Assistant

## 2021-02-09 ENCOUNTER — Other Ambulatory Visit: Payer: Self-pay | Admitting: Internal Medicine

## 2021-02-09 DIAGNOSIS — M48061 Spinal stenosis, lumbar region without neurogenic claudication: Secondary | ICD-10-CM

## 2021-02-09 DIAGNOSIS — H6982 Other specified disorders of Eustachian tube, left ear: Secondary | ICD-10-CM

## 2021-02-09 DIAGNOSIS — M4800 Spinal stenosis, site unspecified: Secondary | ICD-10-CM

## 2021-02-09 DIAGNOSIS — L2084 Intrinsic (allergic) eczema: Secondary | ICD-10-CM

## 2021-02-09 DIAGNOSIS — M4316 Spondylolisthesis, lumbar region: Secondary | ICD-10-CM

## 2021-02-16 ENCOUNTER — Other Ambulatory Visit: Payer: Self-pay | Admitting: Internal Medicine

## 2021-02-16 DIAGNOSIS — E785 Hyperlipidemia, unspecified: Secondary | ICD-10-CM

## 2021-02-24 ENCOUNTER — Other Ambulatory Visit: Payer: Medicare Other

## 2021-02-26 ENCOUNTER — Other Ambulatory Visit: Payer: Self-pay

## 2021-02-26 ENCOUNTER — Ambulatory Visit
Admission: RE | Admit: 2021-02-26 | Discharge: 2021-02-26 | Disposition: A | Discharge status: Home / Self Care | Payer: Medicare Other | Source: Ambulatory Visit | Attending: Physician Assistant | Admitting: Physician Assistant

## 2021-02-26 DIAGNOSIS — M48061 Spinal stenosis, lumbar region without neurogenic claudication: Secondary | ICD-10-CM | POA: Diagnosis not present

## 2021-02-26 DIAGNOSIS — M545 Low back pain, unspecified: Secondary | ICD-10-CM | POA: Diagnosis not present

## 2021-02-26 DIAGNOSIS — M4316 Spondylolisthesis, lumbar region: Secondary | ICD-10-CM

## 2021-02-26 DIAGNOSIS — M4800 Spinal stenosis, site unspecified: Secondary | ICD-10-CM

## 2021-03-04 DIAGNOSIS — G894 Chronic pain syndrome: Secondary | ICD-10-CM | POA: Diagnosis not present

## 2021-03-04 DIAGNOSIS — M4726 Other spondylosis with radiculopathy, lumbar region: Secondary | ICD-10-CM | POA: Diagnosis not present

## 2021-03-04 DIAGNOSIS — M7060 Trochanteric bursitis, unspecified hip: Secondary | ICD-10-CM | POA: Diagnosis not present

## 2021-03-04 DIAGNOSIS — M533 Sacrococcygeal disorders, not elsewhere classified: Secondary | ICD-10-CM | POA: Diagnosis not present

## 2021-03-13 ENCOUNTER — Ambulatory Visit (INDEPENDENT_AMBULATORY_CARE_PROVIDER_SITE_OTHER): Payer: Medicare Other | Admitting: Pharmacist

## 2021-03-13 ENCOUNTER — Other Ambulatory Visit: Payer: Self-pay

## 2021-03-13 DIAGNOSIS — E669 Obesity, unspecified: Secondary | ICD-10-CM

## 2021-03-13 DIAGNOSIS — E785 Hyperlipidemia, unspecified: Secondary | ICD-10-CM

## 2021-03-13 DIAGNOSIS — N183 Chronic kidney disease, stage 3 unspecified: Secondary | ICD-10-CM

## 2021-03-13 DIAGNOSIS — E1169 Type 2 diabetes mellitus with other specified complication: Secondary | ICD-10-CM

## 2021-03-13 DIAGNOSIS — G8929 Other chronic pain: Secondary | ICD-10-CM

## 2021-03-13 DIAGNOSIS — I10 Essential (primary) hypertension: Secondary | ICD-10-CM

## 2021-03-13 DIAGNOSIS — J4520 Mild intermittent asthma, uncomplicated: Secondary | ICD-10-CM | POA: Diagnosis not present

## 2021-03-13 NOTE — Progress Notes (Signed)
Chronic Care Management Pharmacy Note  03/13/2021 Name:  [name] MRN:  [id number] DOB:  1961-07-03  Subjective: [name] is an 60 y.o. year old female who is a primary patient of [name], MD.  The CCM team was consulted for assistance with disease management and care coordination needs.    Engaged with patient by telephone for follow up visit in response to provider referral for pharmacy case management and/or care coordination services.   Consent to Services:  The patient was given information about Chronic Care Management services, agreed to services, and gave verbal consent prior to initiation of services.  Please see initial visit note for detailed documentation.   Patient Care Team: [name], MD as PCP - General Buford Dresser, MD as PCP - Cardiology (Cardiology) Kennith Gain, MD as Consulting Physician (Allergy) Kennith Gain, MD as Consulting Physician (Allergy) Magnus Sinning, MD as Consulting Physician (Physical Medicine and Rehabilitation) Charlton Haws, [facility] as Pharmacist (Pharmacist)   Patient is currently living in a hotel while she searches for alternate housing. She is no longer living on [facility].  Recent office visits: 09/17/20 Dr Ronnald Ramp OV: chronic f/u; Rx'd Farxiga, Bystolic 10 mg. F/U 3 months.  Recent consult visits: 09/24/20 Dr Vira Blanco (pain med): f/u spondylosis  Hospital visits: None in previous 6 months  Objective:  Lab Results  Component Value Date   CREATININE 1.86 (H) 09/17/2020   BUN 29 (H) 09/17/2020   GFR 29.34 (L) 09/17/2020   GFRNONAA 40 (L) 10/17/2019   GFRAA 46 (L) 10/17/2019   NA 139 09/17/2020   K 4.0 09/17/2020   CALCIUM 9.6 09/17/2020   CO2 34 (H) 09/17/2020   GLUCOSE 96 09/17/2020    Lab Results  Component Value Date/Time   HGBA1C 6.7 (H) 09/17/2020 03:52 PM   HGBA1C 6.6 (H) 04/17/2020 10:43 AM   GFR 29.34 (L) 09/17/2020 03:52 PM   GFR 42.71 (L)  04/17/2020 10:43 AM   MICROALBUR 1.0 09/17/2020 03:52 PM   MICROALBUR <0.7 10/18/2019 04:18 PM    Last diabetic Eye exam:  Lab Results  Component Value Date/Time   HMDIABEYEEXA No Retinopathy 08/20/2020 12:00 AM    Last diabetic Foot exam: No results found for: HMDIABFOOTEX   Lab Results  Component Value Date   CHOL 122 09/17/2020   HDL 48.90 09/17/2020   LDLCALC 57 09/17/2020   TRIG 78.0 09/17/2020   CHOLHDL 2 09/17/2020    Hepatic Function Latest Ref Rng & Units 09/17/2020 08/09/2019 06/21/2019  Total Protein 6.0 - 8.3 g/dL 7.5 8.6(H) -  Albumin 3.5 - 5.2 g/dL 4.0 3.9 4.2  AST 0 - 37 U/L 18 26 -  ALT 0 - 35 U/L 17 25 -  Alk Phosphatase 39 - 117 U/L 127(H) 130(H) -  Total Bilirubin 0.2 - 1.2 mg/dL 0.3 0.6 -  Bilirubin, Direct 0.0 - 0.3 mg/dL 0.0 - -    Lab Results  Component Value Date/Time   TSH 0.94 09/17/2020 03:52 PM   TSH 2.80 07/11/2019 09:04 AM    CBC Latest Ref Rng & Units 09/17/2020 10/17/2019 08/09/2019  WBC 4.0 - 10.5 K/uL 6.1 5.4 9.1  Hemoglobin 12.0 - 15.0 g/dL 13.7 13.4 13.8  Hematocrit 36.0 - 46.0 % 42.3 42.8 42.6  Platelets 150.0 - 400.0 K/uL 197.0 260 261    No results found for: VD25OH  Clinical ASCVD: No  The ASCVD Risk score Mikey Bussing DC Jr., et al., 2013) failed to calculate for the following  reasons:   The valid total cholesterol range is 130 to 320 mg/dL    Depression screen [facility] 2/9 09/07/2019 07/04/2019 07/07/2018  Decreased Interest 0 0 0  Down, Depressed, Hopeless 1 0 0  PHQ - 2 Score 1 0 0  Altered sleeping - - -  Tired, decreased energy - - -  Change in appetite - - -  Feeling bad or failure about yourself  - - -  Trouble concentrating - - -  Moving slowly or fidgety/restless - - -  Suicidal thoughts - - -  PHQ-9 Score - - -  Difficult doing work/chores - - -  Some recent data might be hidden     Social History   Tobacco Use  Smoking Status Passive Smoke Exposure - Never Smoker  Smokeless Tobacco Never Used  Tobacco Comment    husband smokes outside and inside the home   BP Readings from Last 3 Encounters:  09/17/20 (!) 142/102  04/17/20 126/88  01/08/20 100/64   Pulse Readings from Last 3 Encounters:  09/17/20 90  04/17/20 82  01/08/20 (!) 58   Wt Readings from Last 3 Encounters:  09/17/20 235 lb (106.6 kg)  04/17/20 233 lb 4 oz (105.8 kg)  10/22/19 226 lb (102.5 kg)   BMI Readings from Last 3 Encounters:  09/17/20 44.40 kg/m  04/17/20 44.07 kg/m  10/22/19 42.70 kg/m    Assessment/Interventions: Review of patient past medical history, allergies, medications, health status, including review of consultants reports, laboratory and other test data, was performed as part of comprehensive evaluation and provision of chronic care management services.   SDOH:  (Social Determinants of Health) assessments and interventions performed: Yes  SDOH Screenings   Alcohol Screen: Not on file  Depression (PZW2-5): Not on file  Financial Resource Strain: Low Risk   . Difficulty of Paying Living Expenses: Not hard at all  Food Insecurity: Not on file  Housing: Not on file  Physical Activity: Not on file  Social Connections: Not on file  Stress: Not on file  Tobacco Use: Medium Risk  . Smoking Tobacco Use: Passive Smoke Exposure - Never Smoker  . Smokeless Tobacco Use: Never Used  Transportation Needs: Not on file    CCM Care Plan  Allergies  Allergen Reactions  . Verapamil Swelling    Medications Reviewed Today    Reviewed by Charlton Haws, [facility] (Pharmacist) on 03/13/21 at 1330  Med List Status: <None>  Medication Order Taking? Sig Documenting Provider Last Dose Status Informant  Accu-Chek FastClix Lancets MISC [id number] Yes USE 1  TO CHECK GLUCOSE TWICE DAILY [name], MD Taking Active   albuterol (PROVENTIL HFA;VENTOLIN HFA) 108 (90 Base) MCG/ACT inhaler [id number] Yes Inhale 2 puffs into the lungs every 4 (four) hours as needed for wheezing or shortness of breath (cough, shortness of  breath or wheezing.). Robyn Haber, MD Taking Active Self  aluminum chloride (DRYSOL) 20 % external solution [id number] Yes Apply topically at bedtime. Landis Martins, DPM Taking Active Self  blood glucose meter kit and supplies KIT [id number] Yes Use to test blood sugar twice daily. DX: E11.8 [name], MD Taking Active Self  cyclobenzaprine (FLEXERIL) 10 MG tablet [id number] Yes Take 1 tablet (10 mg total) by mouth 2 (two) times daily as needed for muscle spasms. Robyn Haber, MD Taking Active Self  dapagliflozin propanediol (FARXIGA) 10 MG TABS tablet [id number] No Take 1 tablet (10 mg total) by mouth daily before breakfast.  Patient not taking: No sig reported  [name], MD Not Taking Active            Med Note Charlton Haws   Fri Mar 13, 2021  1:30 PM) Patient assistance deliveries went to old address. Changing delivery address to PCP office 03/13/21  glucose blood (ACCU-CHEK GUIDE) test strip [id number] Yes Use as instructed twice daily. [name], MD Taking Active   levocetirizine (XYZAL) 5 MG tablet [id number] Yes TAKE 1 TABLET BY MOUTH EVERY EVENING [name], MD Taking Active   mometasone-formoterol [facility]) 100-5 MCG/ACT Hollie Salk [id number] Yes Inhale 2 puffs into the lungs 2 (two) times daily.  Patient taking differently: Inhale 2 puffs into the lungs 2 (two) times daily as needed for wheezing or shortness of breath.   [name], MD Taking Active Self  rosuvastatin (CRESTOR) 20 MG tablet [id number] Yes Take 1 tablet by mouth once daily [name], MD Taking Active   traMADol (ULTRAM) 50 MG tablet [id number] Yes Take 1 tablet (50 mg total) by mouth every 6 (six) hours as needed. Biagio Borg, MD Taking Active   triamcinolone cream (KENALOG) 0.5 % [id number] Yes Apply 1 application topically 3 (three) times daily. [name], MD Taking Active   triamterene-hydrochlorothiazide (DYAZIDE) 37.5-25 MG capsule [id number] Yes Take 1 each (1 capsule  total) by mouth daily. [name], MD Taking Active           Patient Active Problem List   Diagnosis Date Noted  . Eustachian tube dysfunction, left 09/17/2020  . Prolonged Q-T interval on ECG 10/18/2019  . 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 Type 2 diabetes mellitus with obesity ([place]) 07/11/2019  . Intrinsic eczema 07/04/2019  . CKD (chronic kidney disease) stage 3, GFR 30-59 ml/min (HCC) 10/11/2018  . Class 3 severe obesity due to excess calories with serious comorbidity and body mass index (BMI) of 40.0 to 44.9 in adult ([place]) 09/14/2018  . Body mass index 45.0-49.9, adult ([place]) 09/14/2018  . Chronic right-sided low back pain with right-sided sciatica 09/14/2018  . Hyperlipidemia with target LDL less than 130 07/05/2018  . Mild intermittent asthma without complication 44/31/5400  . Snoring 12/14/2016  . Essential hypertension 12/14/2016  . Gastroesophageal reflux disease with esophagitis 08/26/2016  . Dysphagia 08/26/2016  . Spondylolisthesis of lumbar region 05/28/2016  . Trochanteric bursitis of left hip 02/09/2016  . Trochanteric bursitis, right hip 01/12/2016  . Fibromyalgia 08/08/2015  . Lumbar facet arthropathy 08/08/2015  . Primary osteoarthritis of both knees 08/25/2014  . Routine general medical examination at a health care facility 06/01/2013  . Encounter for screening mammogram for breast cancer 06/01/2013  . Abnormal electrocardiogram 02/05/2010  . ALLERGIC RHINITIS 01/26/2010    Immunization History  Administered Date(s) Administered  . Influenza Split 09/27/2012  . Influenza,inj,Quad PF,6+ Mos 08/23/2014, 11/26/2015, 08/26/2016, 07/05/2018, 07/04/2019, 09/17/2020  . PFIZER(Purple Top)SARS-COV-2 Vaccination 01/07/2020, 02/13/2020  . PPD Test 08/23/2014  . Pneumococcal Polysaccharide-23 09/27/2012, 04/17/2020  . Tdap 06/07/2010, 07/19/2019    Conditions to be addressed/monitored:  Hypertension, Hyperlipidemia, Diabetes, Asthma and Allergic  Rhinitis, Chronic pain  Care Plan : CCM Pharmacy Care Plan  Updates made by Charlton Haws, [place] since 03/13/2021 12:00 AM    Problem: Hypertension, Hyperlipidemia, Diabetes, Asthma and Allergic Rhinitis, Chronic pain   Priority: High    Long-Range Goal: Disease management   Start Date: 09/13/2021  This Visit's Progress: On track  Priority: High  Note:    Current Barriers:  . Unable to independently afford treatment regimen .  Unable to independently monitor therapeutic efficacy . Does not adhere to prescribed medication regimen  Pharmacist Clinical Goal(s):  Marland Kitchen Patient will verbalize ability to afford treatment regimen . achieve adherence to monitoring guidelines and medication adherence to achieve therapeutic efficacy . adhere to prescribed medication regimen as evidenced by patient report through collaboration with PharmD and provider.   Interventions: . 1:1 collaboration with [name], MD regarding development and update of comprehensive plan of care as evidenced by provider attestation and co-signature . Inter-disciplinary care team collaboration (see longitudinal plan of care) . Comprehensive medication review performed; medication list updated in electronic medical record  Hypertension    BP goal is:  <130/80 Patient checks BP at home infrequently Patient home BP readings are ranging: n/a   Patient has failed these meds in the past: chlorthalidone, furosemide, Bystolic, spironolactone, verapamil Patient is currently controlled on the following medications:   Triamterence-HCTZ 37.5-25 mg daily  Nebivolol 10 mg daily - not taking   We discussed: pt reports she is not taking nebivolol, she endorses her only BP med is triamterene-hctz. She is not checking BP at home but gets BP checked at monthly pain clinic visits and has been told it is always "good"; pt reports her daughter is getting her a BP monitor so she can check at home   Plan: Continue current  medications  Monitor BP at home 3 times a week  Nebivolol removed from list   Hyperlipidemia    LDL goal < 100 Patient has failed these meds in past: n/a Patient is currently controlled on the following medications:   Rosuvastatin 20 mg daily   We discussed:  diet and exercise extensively; cholesterol goals; statin was started after most recent lipid panel, next panel will reflect LDL on a statin. Pt has cut back on fried foods, red meat.   Plan: Continue current medications and control with diet and exercise    Diabetes / CKD    A1c goal <7% Patient has failed these meds in past: n/a Patient is currently uncontrolled on the following medications:  Farxiga 10 mg daily (via [employer]) - not started   We discussed: pt was approved for [facility] Feb 2022; however she has not received any deliveries because she is no longer living at the address on file, she is temporarily living in a hotel and in process of looking for alternate housing. -Counseled on benefits of [place] for DM, CKD and BP.   Plan: Contact [employer] to update delivery address to PCP office Restart Farxiga 10 mg daily   Asthma / Allergies    Last spirometry score: n/a   Patient has failed these meds in past: n/a Patient is currently controlled on the following medications:   Dulera (mometasone-formoterol) 100-5 mcg 2 puffs BID  Albuterol HFA PRN  Levocetirizine 5 mg qPM   Using maintenance inhaler regularly? No Frequency of rescue inhaler use:  never   We discussed:  Pt does not use Dulera often, allergies flare up in Spring    Plan: Continue current medications   Chronic pain    Per pain mgmt - Dr Vira Blanco Osteoarthritis (knees) Lumbar facet arthropathy/Spondylolisthesis R sided sciatica Bursitis   Patient has failed these meds in past: n/a Patient is currently uncontrolled on the following medications:   Tramadol 50 mg - 2 tab QID #240/month   We discussed: Pt is seeing pain management and  reports she is getting "radiation" therapy for pain. She reports this is helping and she does not  need as much tramadol - typically takes 1 tablet every 6-8 hrs.   Plan: Continue current medications   Patient Goals/Self-Care Activities . Patient will:  - take medications as prescribed focus on medication adherence by routine check blood pressure 3 times a week, document, and provide at future appointments  -Pick up Wilder Glade from PCP office when it available  Follow Up Plan: Telephone follow up appointment with care management team member scheduled for: 3 months Next PCP appointment scheduled for: 04/16/21 @ 1:40 pm      Medication Assistance: Wilder Glade obtained through [employer] medication assistance program.  Enrollment ends 11/07/21  Patient's preferred pharmacy is:  [facility] ([place]), [place] - [address] [place] ([place]) [place] [address] Phone: [phone] Fax: [place], [place] [address] Phone: [phone] Fax: [phone]  Uses pill box? No - prefers bottle Pt endorses 99% compliance  We discussed: Current pharmacy is preferred with insurance plan and patient is satisfied with pharmacy services Patient decided to: Continue current medication management strategy  Care Plan and Follow Up Patient Decision:  Patient agrees to Care Plan and Follow-up.  Plan: Telephone follow up appointment with care management team member scheduled for:  3 months  Charlene Brooke, PharmD, [place], CPP Clinical Pharmacist [facility] at [facility] [phone]

## 2021-03-13 NOTE — Patient Instructions (Signed)
Visit Information  Phone number for Pharmacist: [phone]  Goals Addressed            This Visit's Progress   . Manage My Medicine       Timeframe:  Long-Range Goal Priority:  High Start Date:    03/13/21                         Expected End Date:    09/13/21                   Follow Up Date 07/07/21   - call for medicine refill 2 or 3 days before it runs out - call if I am sick and can't take my medicine - keep a list of all the medicines I take; vitamins and herbals too  -Pick up Wilder Glade from PCP office when it available -Keep PCP appt 04/16/21 @ 1:40 pm   Why is this important?   . These steps will help you keep on track with your medicines.   Notes:       Patient verbalizes understanding of instructions provided today and agrees to view in [place].  Telephone follow up appointment with pharmacy team member scheduled for: 3 months  Charlene Brooke, PharmD, [place], CPP Clinical Pharmacist [facility] at [facility] [phone]

## 2021-03-17 ENCOUNTER — Telehealth: Payer: Self-pay | Admitting: Pharmacist

## 2021-03-17 NOTE — Telephone Encounter (Signed)
-----   Message from Charlton Haws, [facility] sent at 03/13/2021  1:25 PM EDT ----- Regarding: Call [employer] Patient is living in a hotel right now and cannot get meds delivered - she has not received the previous [place] deliveries.  Please call [employer] and tell them to switch her deliveries to the prescriber's office. And she will need a delivery ASAP.

## 2021-03-17 NOTE — Progress Notes (Signed)
    Chronic Care Management Pharmacy Assistant   Name: [name]  MRN: [id number] DOB: 1961/09/18  A call was made to [employer] today to change the patient address to the providers address, due to patient living arrangement. Spoke to the [employer] who made an address change for patient. She stated that the address has been changed but should still call on 03/30/21 to make sure that delivery will go to the right address because the system sometimes revert back to the existing address.  [place] Pharmacist Assistant [phone]  Time spent:22

## 2021-03-30 NOTE — Progress Notes (Signed)
A call was made to [employer] to check on shipment for patient [name]. Spoke with the representative who stated that the shipment is going out today and will be delivered to Dr. Ronnald Ramp office in 7-10 business days from now.   [place] Pharmacist Assistant [phone]  Time spent: 81

## 2021-04-01 DIAGNOSIS — Z79891 Long term (current) use of opiate analgesic: Secondary | ICD-10-CM | POA: Diagnosis not present

## 2021-04-01 DIAGNOSIS — M7060 Trochanteric bursitis, unspecified hip: Secondary | ICD-10-CM | POA: Diagnosis not present

## 2021-04-01 DIAGNOSIS — M4726 Other spondylosis with radiculopathy, lumbar region: Secondary | ICD-10-CM | POA: Diagnosis not present

## 2021-04-01 DIAGNOSIS — M47817 Spondylosis without myelopathy or radiculopathy, lumbosacral region: Secondary | ICD-10-CM | POA: Diagnosis not present

## 2021-04-01 DIAGNOSIS — Z79899 Other long term (current) drug therapy: Secondary | ICD-10-CM | POA: Diagnosis not present

## 2021-04-01 DIAGNOSIS — G894 Chronic pain syndrome: Secondary | ICD-10-CM | POA: Diagnosis not present

## 2021-04-13 DIAGNOSIS — M7060 Trochanteric bursitis, unspecified hip: Secondary | ICD-10-CM | POA: Diagnosis not present

## 2021-04-16 ENCOUNTER — Encounter: Payer: Self-pay | Admitting: Internal Medicine

## 2021-04-16 ENCOUNTER — Ambulatory Visit (INDEPENDENT_AMBULATORY_CARE_PROVIDER_SITE_OTHER): Payer: Medicare Other | Admitting: Internal Medicine

## 2021-04-16 ENCOUNTER — Other Ambulatory Visit: Payer: Self-pay

## 2021-04-16 VITALS — BP 132/88 | HR 73 | Temp 98.3°F | Resp 18 | Ht 61.0 in | Wt 230.4 lb

## 2021-04-16 DIAGNOSIS — N183 Chronic kidney disease, stage 3 unspecified: Secondary | ICD-10-CM | POA: Diagnosis not present

## 2021-04-16 DIAGNOSIS — R9431 Abnormal electrocardiogram [ECG] [EKG]: Secondary | ICD-10-CM | POA: Diagnosis not present

## 2021-04-16 DIAGNOSIS — Z1231 Encounter for screening mammogram for malignant neoplasm of breast: Secondary | ICD-10-CM

## 2021-04-16 DIAGNOSIS — E876 Hypokalemia: Secondary | ICD-10-CM

## 2021-04-16 DIAGNOSIS — B353 Tinea pedis: Secondary | ICD-10-CM | POA: Insufficient documentation

## 2021-04-16 DIAGNOSIS — J4520 Mild intermittent asthma, uncomplicated: Secondary | ICD-10-CM | POA: Diagnosis not present

## 2021-04-16 DIAGNOSIS — Z6841 Body Mass Index (BMI) 40.0 and over, adult: Secondary | ICD-10-CM

## 2021-04-16 DIAGNOSIS — Z0000 Encounter for general adult medical examination without abnormal findings: Secondary | ICD-10-CM

## 2021-04-16 DIAGNOSIS — B351 Tinea unguium: Secondary | ICD-10-CM | POA: Diagnosis not present

## 2021-04-16 DIAGNOSIS — T50905A Adverse effect of unspecified drugs, medicaments and biological substances, initial encounter: Secondary | ICD-10-CM

## 2021-04-16 DIAGNOSIS — E669 Obesity, unspecified: Secondary | ICD-10-CM

## 2021-04-16 DIAGNOSIS — E1169 Type 2 diabetes mellitus with other specified complication: Secondary | ICD-10-CM

## 2021-04-16 DIAGNOSIS — Z23 Encounter for immunization: Secondary | ICD-10-CM

## 2021-04-16 DIAGNOSIS — I10 Essential (primary) hypertension: Secondary | ICD-10-CM | POA: Diagnosis not present

## 2021-04-16 LAB — BASIC METABOLIC PANEL
BUN: 24 mg/dL — ABNORMAL HIGH (ref 6–23)
CO2: 30 mEq/L (ref 19–32)
Calcium: 9.9 mg/dL (ref 8.4–10.5)
Chloride: 101 mEq/L (ref 96–112)
Creatinine, Ser: 1.53 mg/dL — ABNORMAL HIGH (ref 0.40–1.20)
GFR: 36.94 mL/min — ABNORMAL LOW (ref >60.00)
Glucose, Bld: 86 mg/dL (ref 70–99)
Potassium: 3.4 mEq/L — ABNORMAL LOW (ref 3.5–5.1)
Sodium: 142 mEq/L (ref 135–145)

## 2021-04-16 LAB — URINALYSIS, ROUTINE W REFLEX MICROSCOPIC
Bilirubin Urine: NEGATIVE
Hgb urine dipstick: NEGATIVE
Ketones, ur: NEGATIVE
Leukocytes,Ua: NEGATIVE
Nitrite: NEGATIVE
RBC / HPF: NONE SEEN (ref 0–?)
Specific Gravity, Urine: 1.015 (ref 1.000–1.030)
Total Protein, Urine: NEGATIVE
Urine Glucose: NEGATIVE
Urobilinogen, UA: 2 — AB (ref 0.0–1.0)
pH: 7 (ref 5.0–8.0)

## 2021-04-16 LAB — HEPATIC FUNCTION PANEL
ALT: 15 U/L (ref 0–35)
AST: 16 U/L (ref 0–37)
Albumin: 4.1 g/dL (ref 3.5–5.2)
Alkaline Phosphatase: 120 U/L — ABNORMAL HIGH (ref 39–117)
Bilirubin, Direct: 0.1 mg/dL (ref 0.0–0.3)
Total Bilirubin: 0.3 mg/dL (ref 0.2–1.2)
Total Protein: 8.2 g/dL (ref 6.0–8.3)

## 2021-04-16 LAB — HEMOGLOBIN A1C: Hgb A1c MFr Bld: 6.6 % — ABNORMAL HIGH (ref 4.6–6.5)

## 2021-04-16 MED ORDER — TERBINAFINE HCL 250 MG PO TABS: 250.0000 mg | ORAL_TABLET | Freq: Every day | ORAL | 0 refills | Status: AC

## 2021-04-16 NOTE — Progress Notes (Signed)
Subjective:  Patient ID: [name], female    DOB: July 02, 1961  Age: 60 y.o. MRN: [id number]  CC: Annual Exam, Rash, Diabetes, Hypertension, and Asthma  This visit occurred during the SARS-CoV-2 public health emergency.  Safety protocols were in place, including screening questions prior to the visit, additional usage of staff PPE, and extensive cleaning of exam room while observing appropriate contact time as indicated for disinfecting solutions.    HPI [name] presents for a CPX and f/up -  Outpatient Medications Prior to Visit  Medication Sig Dispense Refill   Accu-Chek FastClix Lancets MISC USE 1  TO CHECK GLUCOSE TWICE DAILY 102 each 3   albuterol (PROVENTIL HFA;VENTOLIN HFA) 108 (90 Base) MCG/ACT inhaler Inhale 2 puffs into the lungs every 4 (four) hours as needed for wheezing or shortness of breath (cough, shortness of breath or wheezing.). 1 Inhaler 1   blood glucose meter kit and supplies KIT Use to test blood sugar twice daily. DX: E11.8 1 each 0   glucose blood (ACCU-CHEK GUIDE) test strip Use as instructed twice daily. 300 each 1   levocetirizine (XYZAL) 5 MG tablet TAKE 1 TABLET BY MOUTH EVERY EVENING 90 tablet 1   rosuvastatin (CRESTOR) 20 MG tablet Take 1 tablet by mouth once daily 90 tablet 1   tiZANidine (ZANAFLEX) 4 MG tablet Take 4 mg by mouth as needed for muscle spasms.     traMADol (ULTRAM) 50 MG tablet Take 1 tablet (50 mg total) by mouth every 6 (six) hours as needed. 60 tablet 0   triamcinolone cream (KENALOG) 0.5 % Apply 1 application topically 3 (three) times daily. 454 g 1   aluminum chloride (DRYSOL) 20 % external solution Apply topically at bedtime. 60 mL 3   dapagliflozin propanediol (FARXIGA) 10 MG TABS tablet Take 1 tablet (10 mg total) by mouth daily before breakfast. 90 tablet 1   mometasone-formoterol (DULERA) 100-5 MCG/ACT AERO Inhale 2 puffs into the lungs 2 (two) times daily. (Patient taking differently: Inhale 2 puffs into the lungs 2  (two) times daily as needed for wheezing or shortness of breath.) 13 g 11   triamterene-hydrochlorothiazide (DYAZIDE) 37.5-25 MG capsule Take 1 each (1 capsule total) by mouth daily. 90 capsule 1   cyclobenzaprine (FLEXERIL) 10 MG tablet Take 1 tablet (10 mg total) by mouth 2 (two) times daily as needed for muscle spasms. (Patient not taking: Reported on 04/16/2021) 20 tablet 0   No facility-administered medications prior to visit.    ROS Review of Systems  Objective:  BP 132/88   Pulse 73   Temp 98.3 F (36.8 C) (Oral)   Resp 18   Ht 5' 1"  (1.549 m)   Wt 230 lb 6.4 oz (104.5 kg)   LMP 05/17/2012   SpO2 97%   BMI 43.53 kg/m   BP Readings from Last 3 Encounters:  04/16/21 132/88  09/17/20 (!) 142/102  04/17/20 126/88    Wt Readings from Last 3 Encounters:  04/16/21 230 lb 6.4 oz (104.5 kg)  09/17/20 235 lb (106.6 kg)  04/17/20 233 lb 4 oz (105.8 kg)    Physical Exam Vitals reviewed.  Constitutional:      Appearance: She is obese.  HENT:     Nose: Nose normal.     Mouth/Throat:     Mouth: Mucous membranes are moist.  Eyes:     General: No scleral icterus.    Conjunctiva/sclera: Conjunctivae normal.  Cardiovascular:     Rate and Rhythm: Normal rate  and regular rhythm.     Pulses:          Dorsalis pedis pulses are 1+ on the right side and 1+ on the left side.       Posterior tibial pulses are 1+ on the right side and 1+ on the left side.     Heart sounds: Normal heart sounds, S1 normal and S2 normal. No murmur heard.   No gallop.     Comments: EKG- NSR, 60 bpm No LVH or Q waves Normal EKG Pulmonary:     Effort: Pulmonary effort is normal.     Breath sounds: No stridor. No wheezing, rhonchi or rales.  Abdominal:     General: Abdomen is protuberant. Bowel sounds are normal. There is no distension.     Palpations: Abdomen is soft. There is no hepatomegaly, splenomegaly or mass.     Tenderness: There is no abdominal tenderness.  Musculoskeletal:     Cervical  back: Neck supple.     Right lower leg: Edema (trace pitting) present.     Left lower leg: Edema (trace pitting) present.  Feet:     Right foot:     Skin integrity: No skin breakdown.     Toenail Condition: Right toenails are abnormally thick. Fungal disease present.    Left foot:     Skin integrity: No skin breakdown.     Toenail Condition: Left toenails are abnormally thick. Fungal disease present.    Comments: Over both feet, symmetrically, covering the heels/sides of the feet/plantar sides/and in the webspaces there is scaling, papules, and peeling. Lymphadenopathy:     Cervical: No cervical adenopathy.  Skin:    General: Skin is warm.     Coloration: Skin is not jaundiced.     Findings: Rash present.  Neurological:     General: No focal deficit present.     Mental Status: She is alert.  Psychiatric:        Mood and Affect: Mood normal.        Behavior: Behavior normal.    Lab Results  Component Value Date   WBC 6.1 09/17/2020   HGB 13.7 09/17/2020   HCT 42.3 09/17/2020   PLT 197.0 09/17/2020   GLUCOSE 86 04/16/2021   CHOL 122 09/17/2020   TRIG 78.0 09/17/2020   HDL 48.90 09/17/2020   LDLCALC 57 09/17/2020   ALT 15 04/16/2021   AST 16 04/16/2021   NA 142 04/16/2021   K 3.4 (L) 04/16/2021   CL 101 04/16/2021   CREATININE 1.53 (H) 04/16/2021   BUN 24 (H) 04/16/2021   CO2 30 04/16/2021   TSH 0.94 09/17/2020   HGBA1C 6.6 (H) 04/16/2021   MICROALBUR 1.0 09/17/2020    MR LUMBAR SPINE WO CONTRAST  Result Date: 02/27/2021 CLINICAL DATA:  Back pain and bilateral leg and hip pain. EXAM: MRI LUMBAR SPINE WITHOUT CONTRAST TECHNIQUE: Multiplanar, multisequence MR imaging of the lumbar spine was performed. No intravenous contrast was administered. COMPARISON:  August 12, 21. FINDINGS: Segmentation: Standard segmentation is assumed. The inferior-most fully formed intervertebral disc is labeled L5-S1. This is consistent with prior numbering. Alignment:  Similar 4 mm of grade 1  anterolisthesis of L4 on L5. Vertebrae: Vertebral body heights are maintained. No specific evidence of acute fracture, discitis/osteomyelitis, or suspicious bone lesion. Conus medullaris and cauda equina: Conus extends to the L1 level. Conus appears normal. Paraspinal and other soft tissues: Unremarkable. Disc levels: T12-L1: No significant disc protrusion, foraminal stenosis, or canal stenosis. L1-L2: No significant  disc protrusion, foraminal stenosis, or canal stenosis. L2-L3: No significant disc protrusion, foraminal stenosis, or canal stenosis. L3-L4: Mild disc desiccation and height loss. No substantial change in bilateral facet arthropathy/hypertrophy with mild disc bulging. Similar mild right foraminal stenosis. L4-L5: Mild disc desiccation and height loss. Similar 4 mm of grade 1 anterolisthesis with uncovering of the disc and slight superimposed disc bulging. No substantial change and bilateral facet hypertrophy/arthropathy. Similar mild right and moderate left foraminal stenosis. L5-S1: Similar mild disc bulging and left greater than right facet hypertrophy/arthropathy. Similar mild proximal left foraminal stenosis. IMPRESSION: 1. Similar lower lumbar facet hypertrophy/arthropathy and grade 1 anterolisthesis of L4 on L5. 2. Similar resulting moderate left and mild right foraminal stenosis at L4-L5. 3. Similar mild foraminal stenosis on the right at L3-L4 and the left proximally at L5-S1. Electronically Signed   By: Margaretha Sheffield MD   On: 02/27/2021 08:33    Assessment & Plan:   Zykera was seen today for annual exam, rash, diabetes, hypertension and asthma.  Diagnoses and all orders for this visit:  Tinea pedis of both feet- See below -     terbinafine (LAMISIL) 250 MG tablet; Take 1 tablet (250 mg total) by mouth daily.  Onychomycosis of toenail- Will treat with systemic terbinafine.  I have asked her to come back in 4 to 6 weeks to monitor her liver enzymes. -     Hepatic function panel;  Future -     terbinafine (LAMISIL) 250 MG tablet; Take 1 tablet (250 mg total) by mouth daily. -     Hepatic function panel  Essential hypertension- Her blood pressure is adequately well controlled. -     Basic metabolic panel; Future -     Hepatic function panel; Future -     Hepatic function panel -     Basic metabolic panel -     triamterene-hydrochlorothiazide (DYAZIDE) 37.5-25 MG capsule; Take 1 each (1 capsule total) by mouth daily.  Mild intermittent asthma without complication -     mometasone-formoterol (DULERA) 100-5 MCG/ACT AERO; Inhale 2 puffs into the lungs 2 (two) times daily.  Type 2 diabetes mellitus with obesity ([place])- Her blood sugar is adequately well controlled. -     Basic metabolic panel; Future -     Hemoglobin A1c; Future -     HM Diabetes Foot Exam -     Hemoglobin A1c -     Basic metabolic panel -     dapagliflozin propanediol (FARXIGA) 10 MG TABS tablet; Take 1 tablet (10 mg total) by mouth daily before breakfast.  Stage 3 chronic kidney disease, unspecified whether stage 3a or 3b CKD ([place])- Her renal function is stable.  She will continue to avoid nephrotoxic agents and will maintain control of her blood pressure and her blood sugar. -     Basic metabolic panel; Future -     Urinalysis, Routine w reflex microscopic; Future -     Urinalysis, Routine w reflex microscopic -     Basic metabolic panel -     dapagliflozin propanediol (FARXIGA) 10 MG TABS tablet; Take 1 tablet (10 mg total) by mouth daily before breakfast.  Body mass index 45.0-49.9, adult (HCC)  Class 3 severe obesity due to excess calories with serious comorbidity and body mass index (BMI) of 40.0 to 44.9 in adult [facility])- She is working on her lifestyle modifications to treat this.  Encounter for screening mammogram for breast cancer -     MM DIGITAL SCREENING BILATERAL; Future  Routine general medical examination at a health care facility- Exam completed, labs reviewed, vaccines reviewed and  updated, cancer screenings addressed, patient education material was given.  Prolonged Q-T interval on ECG- Her QT interval is normal now. -     EKG 12-Lead  Need for vaccination -     Pneumococcal conjugate vaccine 20-valent (Prevnar 20)  Drug-induced hypokalemia- Her potassium level remains slightly low.  Will continue Dyazide and I have added a low-dose of a potassium supplement. -     triamterene-hydrochlorothiazide (DYAZIDE) 37.5-25 MG capsule; Take 1 each (1 capsule total) by mouth daily.  I have discontinued [facility]ne P[facility] Caperton's aluminum chloride and cyclobenzaprine. I am also having her start on terbinafine and potassium chloride. Additionally, I am having her maintain her albuterol, blood glucose meter kit and supplies, Accu-Chek FastClix Lancets, Accu-Chek Guide, triamcinolone cream, traMADol, levocetirizine, rosuvastatin, tiZANidine, dapagliflozin propanediol, triamterene-hydrochlorothiazide, and Dulera.  Meds ordered this encounter  Medications   terbinafine (LAMISIL) 250 MG tablet    Sig: Take 1 tablet (250 mg total) by mouth daily.    Dispense:  90 tablet    Refill:  0   dapagliflozin propanediol (FARXIGA) 10 MG TABS tablet    Sig: Take 1 tablet (10 mg total) by mouth daily before breakfast.    Dispense:  90 tablet    Refill:  1   triamterene-hydrochlorothiazide (DYAZIDE) 37.5-25 MG capsule    Sig: Take 1 each (1 capsule total) by mouth daily.    Dispense:  90 capsule    Refill:  1   mometasone-formoterol (DULERA) 100-5 MCG/ACT AERO    Sig: Inhale 2 puffs into the lungs 2 (two) times daily.    Dispense:  39 g    Refill:  1   potassium chloride (KLOR-CON 10) 10 MEQ tablet    Sig: Take 1 tablet (10 mEq total) by mouth 2 (two) times daily.    Dispense:  180 tablet    Refill:  0     Follow-up: Return in about 3 months (around 07/17/2021).  [name], MD

## 2021-04-17 MED ORDER — TRIAMTERENE-HCTZ 37.5-25 MG PO CAPS: 1.0000 | ORAL_CAPSULE | Freq: Every day | ORAL | 1 refills | Status: DC

## 2021-04-17 MED ORDER — DULERA 100-5 MCG/ACT IN AERO: 2.0000 | INHALATION_SPRAY | Freq: Two times a day (BID) | RESPIRATORY_TRACT | 1 refills | Status: DC

## 2021-04-17 MED ORDER — DAPAGLIFLOZIN PROPANEDIOL 10 MG PO TABS: 10.0000 mg | ORAL_TABLET | Freq: Every day | ORAL | 1 refills | Status: DC

## 2021-04-18 ENCOUNTER — Encounter: Payer: Self-pay | Admitting: Internal Medicine

## 2021-04-18 MED ORDER — POTASSIUM CHLORIDE ER 10 MEQ PO TBCR: 10.0000 meq | EXTENDED_RELEASE_TABLET | Freq: Two times a day (BID) | ORAL | 0 refills | Status: DC

## 2021-04-18 NOTE — Patient Instructions (Signed)
Health Maintenance, Female Adopting a healthy lifestyle and getting preventive care are important in promoting health and wellness. Ask your health care provider about: The right schedule for you to have regular tests and exams. Things you can do on your own to prevent diseases and keep yourself healthy. What should I know about diet, weight, and exercise? Eat a healthy diet  Eat a diet that includes plenty of vegetables, fruits, low-fat dairy products, and lean protein. Do not eat a lot of foods that are high in solid fats, added sugars, or sodium.  Maintain a healthy weight Body mass index (BMI) is used to identify weight problems. It estimates body fat based on height and weight. Your health care provider can help determineyour BMI and help you achieve or maintain a healthy weight. Get regular exercise Get regular exercise. This is one of the most important things you can do for your health. Most adults should: Exercise for at least 150 minutes each week. The exercise should increase your heart rate and make you sweat (moderate-intensity exercise). Do strengthening exercises at least twice a week. This is in addition to the moderate-intensity exercise. Spend less time sitting. Even light physical activity can be beneficial. Watch cholesterol and blood lipids Have your blood tested for lipids and cholesterol at 60 years of age, then havethis test every 5 years. Have your cholesterol levels checked more often if: Your lipid or cholesterol levels are high. You are older than 60 years of age. You are at high risk for heart disease. What should I know about cancer screening? Depending on your health history and family history, you may need to have cancer screening at various ages. This may include screening for: Breast cancer. Cervical cancer. Colorectal cancer. Skin cancer. Lung cancer. What should I know about heart disease, diabetes, and high blood pressure? Blood pressure and heart  disease High blood pressure causes heart disease and increases the risk of stroke. This is more likely to develop in people who have high blood pressure readings, are of African descent, or are overweight. Have your blood pressure checked: Every 3-5 years if you are 18-39 years of age. Every year if you are 40 years old or older. Diabetes Have regular diabetes screenings. This checks your fasting blood sugar level. Have the screening done: Once every three years after age 40 if you are at a normal weight and have a low risk for diabetes. More often and at a younger age if you are overweight or have a high risk for diabetes. What should I know about preventing infection? Hepatitis B If you have a higher risk for hepatitis B, you should be screened for this virus. Talk with your health care provider to find out if you are at risk forhepatitis B infection. Hepatitis C Testing is recommended for: Everyone born from 1945 through 1965. Anyone with known risk factors for hepatitis C. Sexually transmitted infections (STIs) Get screened for STIs, including gonorrhea and chlamydia, if: You are sexually active and are younger than 60 years of age. You are older than 60 years of age and your health care provider tells you that you are at risk for this type of infection. Your sexual activity has changed since you were last screened, and you are at increased risk for chlamydia or gonorrhea. Ask your health care provider if you are at risk. Ask your health care provider about whether you are at high risk for HIV. Your health care provider may recommend a prescription medicine to help   prevent HIV infection. If you choose to take medicine to prevent HIV, you should first get tested for HIV. You should then be tested every 3 months for as long as you are taking the medicine. Pregnancy If you are about to stop having your period (premenopausal) and you may become pregnant, seek counseling before you get  pregnant. Take 400 to 800 micrograms (mcg) of folic acid every day if you become pregnant. Ask for birth control (contraception) if you want to prevent pregnancy. Osteoporosis and menopause Osteoporosis is a disease in which the bones lose minerals and strength with aging. This can result in bone fractures. If you are 65 years old or older, or if you are at risk for osteoporosis and fractures, ask your health care provider if you should: Be screened for bone loss. Take a calcium or vitamin D supplement to lower your risk of fractures. Be given hormone replacement therapy (HRT) to treat symptoms of menopause. Follow these instructions at home: Lifestyle Do not use any products that contain nicotine or tobacco, such as cigarettes, e-cigarettes, and chewing tobacco. If you need help quitting, ask your health care provider. Do not use street drugs. Do not share needles. Ask your health care provider for help if you need support or information about quitting drugs. Alcohol use Do not drink alcohol if: Your health care provider tells you not to drink. You are pregnant, may be pregnant, or are planning to become pregnant. If you drink alcohol: Limit how much you use to 0-1 drink a day. Limit intake if you are breastfeeding. Be aware of how much alcohol is in your drink. In the [place] one drink equals one 12 oz bottle of beer (355 mL), one 5 oz glass of wine (148 mL), or one 1 oz glass of hard liquor (44 mL). General instructions Schedule regular health, dental, and eye exams. Stay current with your vaccines. Tell your health care provider if: You often feel depressed. You have ever been abused or do not feel safe at home. Summary Adopting a healthy lifestyle and getting preventive care are important in promoting health and wellness. Follow your health care provider's instructions about healthy diet, exercising, and getting tested or screened for diseases. Follow your health care provider's  instructions on monitoring your cholesterol and blood pressure. This information is not intended to replace advice given to you by your health care provider. Make sure you discuss any questions you have with your healthcare provider. Document Revised: 10/18/2018 Document Reviewed: 10/18/2018 Elsevier Patient Education  2022 Elsevier Inc.  

## 2021-04-20 NOTE — Telephone Encounter (Signed)
   Please call to patient to discuss  [place]

## 2021-04-20 NOTE — Telephone Encounter (Signed)
Please call [employer] and check status of shipment - they were supposed to be shipping it to Dr's office as of 03/30/21, we have not received anything. Update patient with results.

## 2021-04-21 ENCOUNTER — Telehealth: Payer: Self-pay | Admitting: Pharmacist

## 2021-04-21 NOTE — Progress Notes (Signed)
    Chronic Care Management Pharmacy Assistant   Name: [name]  MRN: [id number] DOB: [birth year]  Medications: Outpatient Encounter Medications as of 04/21/2021  Medication Sig   Accu-Chek FastClix Lancets MISC USE 1  TO CHECK GLUCOSE TWICE DAILY   albuterol (PROVENTIL HFA;VENTOLIN HFA) 108 (90 Base) MCG/ACT inhaler Inhale 2 puffs into the lungs every 4 (four) hours as needed for wheezing or shortness of breath (cough, shortness of breath or wheezing.).   blood glucose meter kit and supplies KIT Use to test blood sugar twice daily. DX: E11.8   dapagliflozin propanediol (FARXIGA) 10 MG TABS tablet Take 1 tablet (10 mg total) by mouth daily before breakfast.   glucose blood (ACCU-CHEK GUIDE) test strip Use as instructed twice daily.   levocetirizine (XYZAL) 5 MG tablet TAKE 1 TABLET BY MOUTH EVERY EVENING   mometasone-formoterol (DULERA) 100-5 MCG/ACT AERO Inhale 2 puffs into the lungs 2 (two) times daily.   potassium chloride (KLOR-CON 10) 10 MEQ tablet Take 1 tablet (10 mEq total) by mouth 2 (two) times daily.   rosuvastatin (CRESTOR) 20 MG tablet Take 1 tablet by mouth once daily   terbinafine (LAMISIL) 250 MG tablet Take 1 tablet (250 mg total) by mouth daily.   tiZANidine (ZANAFLEX) 4 MG tablet Take 4 mg by mouth as needed for muscle spasms.   traMADol (ULTRAM) 50 MG tablet Take 1 tablet (50 mg total) by mouth every 6 (six) hours as needed.   triamcinolone cream (KENALOG) 0.5 % Apply 1 application topically 3 (three) times daily.   triamterene-hydrochlorothiazide (DYAZIDE) 37.5-25 MG capsule Take 1 each (1 capsule total) by mouth daily.   No facility-administered encounter medications on file as of 04/21/2021.   Pharmacist Review  A call was made to [employer] to check on the status of the patient's shipment for Farxiga. I explained to the representative that a shipment was suppose to go out on 03/30/21 but was not received. The representative stated that the postal delivered the package  to the patient old address. She stated that she was updating the address again to the pharmacy,so that her next delivery will be sent to the patient doctors office at [place] 2nd floor. The rep stated that the delivery will take up to 10-15 business days.   [facility] Pharmacist Assistant [phone]   Time spent:35

## 2021-05-04 ENCOUNTER — Telehealth: Payer: Self-pay | Admitting: Internal Medicine

## 2021-05-04 DIAGNOSIS — T50905A Adverse effect of unspecified drugs, medicaments and biological substances, initial encounter: Secondary | ICD-10-CM

## 2021-05-04 DIAGNOSIS — I10 Essential (primary) hypertension: Secondary | ICD-10-CM

## 2021-05-04 MED ORDER — TRIAMTERENE-HCTZ 37.5-25 MG PO CAPS: 1.0000 | ORAL_CAPSULE | Freq: Every day | ORAL | 1 refills | Status: DC

## 2021-05-04 NOTE — Telephone Encounter (Signed)
   Patient states medication not at pharmacy Please resend triamterene-hydrochlorothiazide (DYAZIDE) 37.5-25 MG capsule to   [place] ([place]), [place] - [address]

## 2021-05-04 NOTE — Telephone Encounter (Signed)
Rx has been submitted again.

## 2021-05-06 DIAGNOSIS — M533 Sacrococcygeal disorders, not elsewhere classified: Secondary | ICD-10-CM | POA: Diagnosis not present

## 2021-05-06 NOTE — Progress Notes (Signed)
    Chronic Care Management Pharmacy Assistant   Name: [name]  MRN: [id number] DOB: 08-16-1961   Reason for Encounter: Chart Review    Medications: Outpatient Encounter Medications as of 04/21/2021  Medication Sig   Accu-Chek FastClix Lancets MISC USE 1  TO CHECK GLUCOSE TWICE DAILY   albuterol (PROVENTIL HFA;VENTOLIN HFA) 108 (90 Base) MCG/ACT inhaler Inhale 2 puffs into the lungs every 4 (four) hours as needed for wheezing or shortness of breath (cough, shortness of breath or wheezing.).   blood glucose meter kit and supplies KIT Use to test blood sugar twice daily. DX: E11.8   dapagliflozin propanediol (FARXIGA) 10 MG TABS tablet Take 1 tablet (10 mg total) by mouth daily before breakfast.   glucose blood (ACCU-CHEK GUIDE) test strip Use as instructed twice daily.   levocetirizine (XYZAL) 5 MG tablet TAKE 1 TABLET BY MOUTH EVERY EVENING   mometasone-formoterol (DULERA) 100-5 MCG/ACT AERO Inhale 2 puffs into the lungs 2 (two) times daily.   potassium chloride (KLOR-CON 10) 10 MEQ tablet Take 1 tablet (10 mEq total) by mouth 2 (two) times daily.   rosuvastatin (CRESTOR) 20 MG tablet Take 1 tablet by mouth once daily   terbinafine (LAMISIL) 250 MG tablet Take 1 tablet (250 mg total) by mouth daily.   tiZANidine (ZANAFLEX) 4 MG tablet Take 4 mg by mouth as needed for muscle spasms.   traMADol (ULTRAM) 50 MG tablet Take 1 tablet (50 mg total) by mouth every 6 (six) hours as needed.   triamcinolone cream (KENALOG) 0.5 % Apply 1 application topically 3 (three) times daily.   [DISCONTINUED] triamterene-hydrochlorothiazide (DYAZIDE) 37.5-25 MG capsule Take 1 each (1 capsule total) by mouth daily.   No facility-administered encounter medications on file as of 04/21/2021.   Pharmacist Review Reviewed chart for medication changes and adherence.  No OVs, Consults, or hospital visits since last care coordination call / Pharmacist visit. Recent medication changes indicated:  triamterene-hctz 1 cap daily, per Dr. Ronnald Ramp  No gaps in adherence identified. Patient has follow up scheduled with pharmacy team. No further action required.   [facility] Pharmacist Assistant [phone]   Time spent:5

## 2021-05-07 ENCOUNTER — Telehealth: Payer: Self-pay | Admitting: Internal Medicine

## 2021-05-07 NOTE — Telephone Encounter (Signed)
    Patient called and said that she is having the following side effects, stomach pains, trembling hands and frequent bowel movements from dapagliflozin propanediol (FARXIGA) 10 MG TABS tablet

## 2021-05-08 ENCOUNTER — Other Ambulatory Visit: Payer: Self-pay | Admitting: Internal Medicine

## 2021-05-08 DIAGNOSIS — E1169 Type 2 diabetes mellitus with other specified complication: Secondary | ICD-10-CM

## 2021-05-08 MED ORDER — TIRZEPATIDE 2.5 MG/0.5ML ~~LOC~~ SOAJ: 2.5000 mg | SUBCUTANEOUS | 0 refills | Status: DC

## 2021-05-08 NOTE — Telephone Encounter (Signed)
Pt has been informed and expressed understanding.  

## 2021-05-08 NOTE — Telephone Encounter (Signed)
Pt has been informed and expressed understanding. She would like to know would she be put on an alternative? Please advise.

## 2021-05-13 ENCOUNTER — Telehealth: Payer: Self-pay | Admitting: Pharmacist

## 2021-05-13 ENCOUNTER — Telehealth: Payer: Self-pay

## 2021-05-13 NOTE — Progress Notes (Signed)
Chronic Care Management Pharmacy Assistant   Name: [name]  MRN: [id number] DOB: Aug 17, 1961   Reason for Encounter: Disease State   Conditions to be addressed/monitored: HTN   Recent office visits:  04/16/21 Dr. Ronnald Ramp Internal Medicine (Tinea Pedis) medications ordered Terbinafine HCI 250 mg Potassium 10 meq 2 times daily  Recent consult visits:  None ID  Hospital visits:  None in previous 6 months  Medications: Outpatient Encounter Medications as of 05/13/2021  Medication Sig   Accu-Chek FastClix Lancets MISC USE 1  TO CHECK GLUCOSE TWICE DAILY   albuterol (PROVENTIL HFA;VENTOLIN HFA) 108 (90 Base) MCG/ACT inhaler Inhale 2 puffs into the lungs every 4 (four) hours as needed for wheezing or shortness of breath (cough, shortness of breath or wheezing.).   blood glucose meter kit and supplies KIT Use to test blood sugar twice daily. DX: E11.8   glucose blood (ACCU-CHEK GUIDE) test strip Use as instructed twice daily.   levocetirizine (XYZAL) 5 MG tablet TAKE 1 TABLET BY MOUTH EVERY EVENING   mometasone-formoterol (DULERA) 100-5 MCG/ACT AERO Inhale 2 puffs into the lungs 2 (two) times daily.   potassium chloride (KLOR-CON 10) 10 MEQ tablet Take 1 tablet (10 mEq total) by mouth 2 (two) times daily.   rosuvastatin (CRESTOR) 20 MG tablet Take 1 tablet by mouth once daily   terbinafine (LAMISIL) 250 MG tablet Take 1 tablet (250 mg total) by mouth daily.   tirzepatide [facility]) 2.5 MG/0.5ML Pen Inject 2.5 mg into the skin once a week.   tiZANidine (ZANAFLEX) 4 MG tablet Take 4 mg by mouth as needed for muscle spasms.   traMADol (ULTRAM) 50 MG tablet Take 1 tablet (50 mg total) by mouth every 6 (six) hours as needed.   triamcinolone cream (KENALOG) 0.5 % Apply 1 application topically 3 (three) times daily.   triamterene-hydrochlorothiazide (DYAZIDE) 37.5-25 MG capsule Take 1 each (1 capsule total) by mouth daily.   No facility-administered encounter medications on file as of  05/13/2021.    Pharmacist Review Reviewed chart prior to disease state call. Spoke with patient regarding BP  Recent Office Vitals: BP Readings from Last 3 Encounters:  04/16/21 132/88  09/17/20 (!) 142/102  04/17/20 126/88   Pulse Readings from Last 3 Encounters:  04/16/21 73  09/17/20 90  04/17/20 82    Wt Readings from Last 3 Encounters:  04/16/21 230 lb 6.4 oz (104.5 kg)  09/17/20 235 lb (106.6 kg)  04/17/20 233 lb 4 oz (105.8 kg)     Kidney Function Lab Results  Component Value Date/Time   CREATININE 1.53 (H) 04/16/2021 02:49 PM   CREATININE 1.86 (H) 09/17/2020 03:52 PM   GFR 36.94 (L) 04/16/2021 02:49 PM   GFRNONAA 40 (L) 10/17/2019 09:58 AM   GFRAA 46 (L) 10/17/2019 09:58 AM    BMP Latest Ref Rng & Units 04/16/2021 09/17/2020 04/17/2020  Glucose 70 - 99 mg/dL 86 96 99  BUN 6 - 23 mg/dL 24(H) 29(H) 25(H)  Creatinine 0.40 - 1.20 mg/dL 1.53(H) 1.86(H) 1.51(H)  Sodium 135 - 145 mEq/L 142 139 138  Potassium 3.5 - 5.1 mEq/L 3.4(L) 4.0 3.5  Chloride 96 - 112 mEq/L 101 99 99  CO2 19 - 32 mEq/L 30 34(H) 32  Calcium 8.4 - 10.5 mg/dL 9.9 9.6 9.9    Current antihypertensive regimen:  Triamterence-HCTZ 37.5-25 mg daily  How often are you checking your Blood Pressure?  Patient states that she does not check blood pressure at home because she is not  having any issues with blood pressure. Also she states that she gets it done  at her pain management visits.  Current home BP readings: Patient last blood pressure readings were 132/88 on 04/16/21  What recent interventions/DTPs have been made by any provider to improve Blood Pressure control since last CPP Visit: Continue current medication, monitor bp at home 3 times a week,per CPP on 03/13/21  Any recent hospitalizations or ED visits since last visit with CPP? No  What diet changes have been made to improve Blood Pressure Control?  Patient states that she has not made any changes in diet  What exercise is being done to improve  your Blood Pressure Control?  Patient states that she does try and walk when it is not to hot  Adherence Review: Is the patient currently on ACE/ARB medication? No Does the patient have >5 day gap between last estimated fill dates? No   Star Rating Drugs: Rosuvastatin 02/16/21 90 ds  Ethelene Hal Clinical Pharmacist Assistant [phone]   Time spent:40

## 2021-05-13 NOTE — Telephone Encounter (Signed)
Key: [id number]

## 2021-05-16 DIAGNOSIS — H2513 Age-related nuclear cataract, bilateral: Secondary | ICD-10-CM | POA: Diagnosis not present

## 2021-05-16 DIAGNOSIS — H5203 Hypermetropia, bilateral: Secondary | ICD-10-CM | POA: Diagnosis not present

## 2021-05-16 DIAGNOSIS — H52223 Regular astigmatism, bilateral: Secondary | ICD-10-CM | POA: Diagnosis not present

## 2021-05-16 DIAGNOSIS — H524 Presbyopia: Secondary | ICD-10-CM | POA: Diagnosis not present

## 2021-05-16 LAB — HM DIABETES EYE EXAM

## 2021-05-18 ENCOUNTER — Other Ambulatory Visit: Payer: Self-pay | Admitting: Internal Medicine

## 2021-05-18 DIAGNOSIS — E1169 Type 2 diabetes mellitus with other specified complication: Secondary | ICD-10-CM

## 2021-05-18 MED ORDER — TRULICITY 1.5 MG/0.5ML ~~LOC~~ SOAJ: 1.5000 mg | SUBCUTANEOUS | 0 refills | Status: DC

## 2021-05-20 ENCOUNTER — Other Ambulatory Visit: Payer: Self-pay

## 2021-05-20 ENCOUNTER — Ambulatory Visit (INDEPENDENT_AMBULATORY_CARE_PROVIDER_SITE_OTHER): Payer: Medicare Other | Admitting: Pharmacist

## 2021-05-20 DIAGNOSIS — J4520 Mild intermittent asthma, uncomplicated: Secondary | ICD-10-CM | POA: Diagnosis not present

## 2021-05-20 DIAGNOSIS — E669 Obesity, unspecified: Secondary | ICD-10-CM

## 2021-05-20 DIAGNOSIS — E1169 Type 2 diabetes mellitus with other specified complication: Secondary | ICD-10-CM | POA: Diagnosis not present

## 2021-05-20 MED ORDER — TRULICITY 0.75 MG/0.5ML ~~LOC~~ SOAJ: 0.7500 mg | SUBCUTANEOUS | 0 refills | Status: DC

## 2021-05-20 MED ORDER — BUDESONIDE-FORMOTEROL FUMARATE 80-4.5 MCG/ACT IN AERO: 2.0000 | INHALATION_SPRAY | Freq: Two times a day (BID) | RESPIRATORY_TRACT | 3 refills | Status: DC | PRN

## 2021-05-20 NOTE — Patient Instructions (Addendum)
  You were given samples of TRULICITY 3.57 MG. This should help with weight loss and diabetes.  Inject 1 pen ONCE A WEEK. Throw away the pen after one use.  It may cause some nausea in the beginning. If you eat smaller meals you are less likely to have nausea. If you do have some nausea, it normally goes away after 2-3 weeks on Trulicity.  We applied for patient assistance to get Trulicity for free from the company once you run out of samples. Please bring a copy of your social security income to the office to complete the application  Please call with any issues: [phone]    Goals Addressed             This Visit's Progress    Manage My Medicine       Timeframe:  Long-Range Goal Priority:  High Start Date:    03/13/21                         Expected End Date:    09/13/21                   Follow Up Date 07/07/21   - call for medicine refill 2 or 3 days before it runs out - call if I am sick and can't take my medicine - keep a list of all the medicines I take; vitamins and herbals too  -Start Trulicity 0.17 mg once a week. Bring social security documents to office to complete patient assistance application.   Why is this important?   These steps will help you keep on track with your medicines.   Notes:          The patient verbalized understanding of instructions, educational materials, and care plan provided today and declined offer to receive copy of patient instructions, educational materials, and care plan.  Telephone follow up appointment with pharmacy team member scheduled for: 1 month  Charlene Brooke, PharmD, [place], CPP Clinical Pharmacist [facility] at [facility] [phone]

## 2021-05-20 NOTE — Progress Notes (Signed)
Chronic Care Management Pharmacy Note  05/20/2021 Name:  [name] MRN:  [id number] DOB:  [birth year]  Summary: -Pt has not started Trulicity, she didn't think she needed it since A1c is 6.6%. Discussed wt loss benefits and pt agreed to start. -Pt is out of [facility] and has occasional shortness of breath. There is no pt assistance for [facility], but pt is already enrolled in [employer] and can get Symbicort through that -Pt assistance needs to be shipped to [facility] since pt does not have permanent address right now  Recommendations/Changes made from today's visit: -Start Trulicity 8.56 mg weekly. Samples provided x 6 week supply -Pursue pt assistance for Trulicity -Switch Dulera to Symbicort and obtain through [employer].    Subjective: [name] is an 60 y.o. year old female who is a primary patient of [name], MD.  The CCM team was consulted for assistance with disease management and care coordination needs.    Engaged with patient by telephone for follow up visit in response to provider referral for pharmacy case management and/or care coordination services.   Consent to Services:  The patient was given information about Chronic Care Management services, agreed to services, and gave verbal consent prior to initiation of services.  Please see initial visit note for detailed documentation.   Patient Care Team: [name], MD as PCP - General Buford Dresser, MD as PCP - Cardiology (Cardiology) Kennith Gain, MD as Consulting Physician (Allergy) Kennith Gain, MD as Consulting Physician (Allergy) Magnus Sinning, MD as Consulting Physician (Physical Medicine and Rehabilitation) Charlton Haws, [facility] as Pharmacist (Pharmacist)   Patient is currently living in a hotel while she searches for alternate housing. She is no longer living on [facility].  Recent office visits: 04/16/21 Dr Ronnald Ramp OV: chronic f/u; tinea pedis/onychomycosis - rx'd  terbinafine 250 mg. Rx'd Potassium 10 meq (K 3.4). F/U 3 months. 09/17/20 Dr Ronnald Ramp OV: chronic f/u; Rx'd Farxiga, Bystolic 10 mg. F/U 3 months.  Recent consult visits: 09/24/20 Dr Vira Blanco (pain med): f/u spondylosis  Hospital visits: None in previous 6 months  Objective:  Lab Results  Component Value Date   CREATININE 1.53 (H) 04/16/2021   BUN 24 (H) 04/16/2021   GFR 36.94 (L) 04/16/2021   GFRNONAA 40 (L) 10/17/2019   GFRAA 46 (L) 10/17/2019   NA 142 04/16/2021   K 3.4 (L) 04/16/2021   CALCIUM 9.9 04/16/2021   CO2 30 04/16/2021   GLUCOSE 86 04/16/2021    Lab Results  Component Value Date/Time   HGBA1C 6.6 (H) 04/16/2021 02:49 PM   HGBA1C 6.7 (H) 09/17/2020 03:52 PM   GFR 36.94 (L) 04/16/2021 02:49 PM   GFR 29.34 (L) 09/17/2020 03:52 PM   MICROALBUR 1.0 09/17/2020 03:52 PM   MICROALBUR <0.7 10/18/2019 04:18 PM    Last diabetic Eye exam:  Lab Results  Component Value Date/Time   HMDIABEYEEXA No Retinopathy 08/20/2020 12:00 AM    Last diabetic Foot exam: No results found for: HMDIABFOOTEX   Lab Results  Component Value Date   CHOL 122 09/17/2020   HDL 48.90 09/17/2020   LDLCALC 57 09/17/2020   TRIG 78.0 09/17/2020   CHOLHDL 2 09/17/2020    Hepatic Function Latest Ref Rng & Units 04/16/2021 09/17/2020 08/09/2019  Total Protein 6.0 - 8.3 g/dL 8.2 7.5 8.6(H)  Albumin 3.5 - 5.2 g/dL 4.1 4.0 3.9  AST 0 - 37 U/L 16 18 26   ALT 0 - 35 U/L 15 17 25   Alk  Phosphatase 39 - 117 U/L 120(H) 127(H) 130(H)  Total Bilirubin 0.2 - 1.2 mg/dL 0.3 0.3 0.6  Bilirubin, Direct 0.0 - 0.3 mg/dL 0.1 0.0 -    Lab Results  Component Value Date/Time   TSH 0.94 09/17/2020 03:52 PM   TSH 2.80 07/11/2019 09:04 AM    CBC Latest Ref Rng & Units 09/17/2020 10/17/2019 08/09/2019  WBC 4.0 - 10.5 K/uL 6.1 5.4 9.1  Hemoglobin 12.0 - 15.0 g/dL 13.7 13.4 13.8  Hematocrit 36.0 - 46.0 % 42.3 42.8 42.6  Platelets 150.0 - 400.0 K/uL 197.0 260 261    No results found for: VD25OH  Clinical ASCVD: No   The ASCVD Risk score Mikey Bussing DC Jr., et al., 2013) failed to calculate for the following reasons:   The valid total cholesterol range is 130 to 320 mg/dL    Depression screen [facility] 2/9 04/16/2021 09/07/2019 07/04/2019  Decreased Interest 0 0 0  Down, Depressed, Hopeless 0 1 0  PHQ - 2 Score 0 1 0  Altered sleeping - - -  Tired, decreased energy - - -  Change in appetite - - -  Feeling bad or failure about yourself  - - -  Trouble concentrating - - -  Moving slowly or fidgety/restless - - -  Suicidal thoughts - - -  PHQ-9 Score - - -  Difficult doing work/chores - - -  Some recent data might be hidden     Social History   Tobacco Use  Smoking Status Passive Smoke Exposure - Never Smoker  Smokeless Tobacco Never  Tobacco Comments   husband smokes outside and inside the home   BP Readings from Last 3 Encounters:  04/16/21 132/88  09/17/20 (!) 142/102  04/17/20 126/88   Pulse Readings from Last 3 Encounters:  04/16/21 73  09/17/20 90  04/17/20 82   Wt Readings from Last 3 Encounters:  04/16/21 230 lb 6.4 oz (104.5 kg)  09/17/20 235 lb (106.6 kg)  04/17/20 233 lb 4 oz (105.8 kg)   BMI Readings from Last 3 Encounters:  04/16/21 43.53 kg/m  09/17/20 44.40 kg/m  04/17/20 44.07 kg/m    Assessment/Interventions: Review of patient past medical history, allergies, medications, health status, including review of consultants reports, laboratory and other test data, was performed as part of comprehensive evaluation and provision of chronic care management services.   SDOH:  (Social Determinants of Health) assessments and interventions performed: Yes  SDOH Screenings   Alcohol Screen: Not on file  Depression (PHQ2-9): Low Risk    PHQ-2 Score: 0  Financial Resource Strain: Low Risk    Difficulty of Paying Living Expenses: Not hard at all  Food Insecurity: Not on file  Housing: Not on file  Physical Activity: Not on file  Social Connections: Not on file  Stress: Not on file   Tobacco Use: Medium Risk   Smoking Tobacco Use: Passive Smoke Exposure - Never Smoker   Smokeless Tobacco Use: Never  Transportation Needs: Not on file    [place]  Allergies  Allergen Reactions   Verapamil Swelling    Medications Reviewed Today     Reviewed by [name], MD (Physician) on 04/18/21 at [place] List Status: <None>   Medication Order Taking? Sig Documenting Provider Last Dose Status Informant  Accu-Chek FastClix Lancets MISC [id number] Yes USE 1  TO CHECK GLUCOSE TWICE DAILY [name], MD Taking Active   albuterol (PROVENTIL HFA;VENTOLIN HFA) 108 (90 Base) MCG/ACT inhaler [id number] Yes Inhale 2  puffs into the lungs every 4 (four) hours as needed for wheezing or shortness of breath (cough, shortness of breath or wheezing.). Robyn Haber, MD Taking Active Self  blood glucose meter kit and supplies KIT [id number] Yes Use to test blood sugar twice daily. DX: E11.8 [name], MD Taking Active Self  dapagliflozin propanediol (FARXIGA) 10 MG TABS tablet [id number]  Take 1 tablet (10 mg total) by mouth daily before breakfast. [name], MD  Active   glucose blood (ACCU-CHEK GUIDE) test strip [id number] Yes Use as instructed twice daily. [name], MD Taking Active   levocetirizine (XYZAL) 5 MG tablet [id number] Yes TAKE 1 TABLET BY MOUTH EVERY EVENING [name], MD Taking Active   mometasone-formoterol [facility]) 100-5 MCG/ACT Hollie Salk [id number]  Inhale 2 puffs into the lungs 2 (two) times daily. [name], MD  Active   potassium chloride (KLOR-CON 10) 10 MEQ tablet [id number] Yes Take 1 tablet (10 mEq total) by mouth 2 (two) times daily. [name], MD  Active   rosuvastatin (CRESTOR) 20 MG tablet [id number] Yes Take 1 tablet by mouth once daily [name], MD Taking Active   terbinafine (LAMISIL) 250 MG tablet [id number] Yes Take 1 tablet (250 mg total) by mouth daily. [name], MD  Active   tiZANidine (ZANAFLEX) 4 MG  tablet [id number] Yes Take 4 mg by mouth as needed for muscle spasms. [provider] Taking Active   traMADol (ULTRAM) 50 MG tablet [id number] Yes Take 1 tablet (50 mg total) by mouth every 6 (six) hours as needed. Biagio Borg, MD Taking Active   triamcinolone cream (KENALOG) 0.5 % [id number] Yes Apply 1 application topically 3 (three) times daily. [name], MD Taking Active   triamterene-hydrochlorothiazide (DYAZIDE) 37.5-25 MG capsule [id number]  Take 1 each (1 capsule total) by mouth daily. [name], MD  Active             Patient Active Problem List   Diagnosis Date Noted   Tinea pedis of both feet 04/16/2021   Onychomycosis of toenail 04/16/2021   Prolonged Q-T interval on ECG 10/18/2019   Dermal hypersensitivity reaction 07/11/2019   Type 2 diabetes mellitus with obesity ([place]) 07/11/2019   Intrinsic eczema 07/04/2019   CKD (chronic kidney disease) stage 3, GFR 30-59 ml/min (HCC) 10/11/2018   Class 3 severe obesity due to excess calories with serious comorbidity and body mass index (BMI) of 40.0 to 44.9 in adult ([place]) 09/14/2018   Chronic right-sided low back pain with right-sided sciatica 09/14/2018   Hyperlipidemia with target LDL less than 130 07/05/2018   Mild intermittent asthma without complication 09/98/3382   Snoring 12/14/2016   Essential hypertension 12/14/2016   Gastroesophageal reflux disease with esophagitis 08/26/2016   Dysphagia 08/26/2016   Spondylolisthesis of lumbar region 05/28/2016   Trochanteric bursitis of left hip 02/09/2016   Trochanteric bursitis, right hip 01/12/2016   Fibromyalgia 08/08/2015   Lumbar facet arthropathy 08/08/2015   Primary osteoarthritis of both knees 08/25/2014   Routine general medical examination at a health care facility 06/01/2013   Encounter for screening mammogram for breast cancer 06/01/2013   Abnormal electrocardiogram 02/05/2010   ALLERGIC RHINITIS 01/26/2010    Immunization History  Administered  Date(s) Administered   Influenza Split 09/27/2012   Influenza,inj,Quad PF,6+ Mos 08/23/2014, 11/26/2015, 08/26/2016, 07/05/2018, 07/04/2019, 09/17/2020   PFIZER(Purple Top)SARS-COV-2 Vaccination 01/07/2020, 02/13/2020   PNEUMOCOCCAL CONJUGATE-20 04/16/2021   PPD Test 08/23/2014   Pneumococcal Polysaccharide-23 09/27/2012, 04/17/2020  Tdap 06/07/2010, 07/19/2019    Conditions to be addressed/monitored:  Hypertension, Hyperlipidemia, Diabetes, Asthma and Allergic Rhinitis, Chronic pain  There are no care plans that you recently modified to display for this patient.    Medication Assistance:  Wilder Glade obtained through [employer] medication assistance program.  Enrollment ends 11/07/21  Compliance/Adherence/Medication fill history: Care Gaps: Shingrix Covid booster (due 03/12/20) Pneumococcal (PPSV)  Star-Rating Drugs: Rosuvastatin - LF 02/16/21 x 90 ds  Patient's preferred pharmacy is:  [facility] ([place]), [place] - [address] [place] [address] Phone: [phone] Fax: [facility] [place], [place] [address] Phone: [phone] Fax: [phone]  Uses pill box? No - prefers bottle Pt endorses 99% compliance  We discussed: Current pharmacy is preferred with insurance plan and patient is satisfied with pharmacy services Patient decided to: Continue current medication management strategy  Care Plan and Follow Up Patient Decision:  Patient agrees to Care Plan and Follow-up.  Plan: Telephone follow up appointment with care management team member scheduled for:  1 month  Charlene Brooke, PharmD, [place], CPP Clinical Pharmacist [facility] at [facility] [phone]

## 2021-05-27 ENCOUNTER — Telehealth: Payer: Self-pay | Admitting: Pharmacist

## 2021-05-27 NOTE — Progress Notes (Signed)
Chronic Care Management Pharmacy Note    Name:  [name]         MRN:  [id number]      DOB:  [birth year]   Called [employer] to check to the status of patient assistance for Symbicort, patient was approved through 11/07/21. Prescription will be moved to fulfillment center and can take up to 14 days to receive. Shipment will be sent directly to providers office.  Orinda Kenner, [place] Clinical Pharmacists Assistant [phone]  Time Spent: 56

## 2021-05-30 ENCOUNTER — Ambulatory Visit
Admission: RE | Admit: 2021-05-30 | Discharge: 2021-05-30 | Disposition: A | Discharge status: Home / Self Care | Payer: Medicare Other | Source: Ambulatory Visit | Attending: Internal Medicine | Admitting: Internal Medicine

## 2021-05-30 DIAGNOSIS — Z1231 Encounter for screening mammogram for malignant neoplasm of breast: Secondary | ICD-10-CM | POA: Diagnosis not present

## 2021-06-03 DIAGNOSIS — M47817 Spondylosis without myelopathy or radiculopathy, lumbosacral region: Secondary | ICD-10-CM | POA: Diagnosis not present

## 2021-06-03 DIAGNOSIS — M533 Sacrococcygeal disorders, not elsewhere classified: Secondary | ICD-10-CM | POA: Diagnosis not present

## 2021-06-03 DIAGNOSIS — M7060 Trochanteric bursitis, unspecified hip: Secondary | ICD-10-CM | POA: Diagnosis not present

## 2021-06-03 DIAGNOSIS — G894 Chronic pain syndrome: Secondary | ICD-10-CM | POA: Diagnosis not present

## 2021-06-04 ENCOUNTER — Telehealth: Payer: Self-pay

## 2021-06-04 NOTE — Telephone Encounter (Signed)
Patient has been informed that medication has arrived and ready for pick up.   Located at front desk.

## 2021-06-12 ENCOUNTER — Telehealth: Payer: Medicare Other

## 2021-06-15 ENCOUNTER — Telehealth: Payer: Self-pay | Admitting: Internal Medicine

## 2021-06-15 ENCOUNTER — Other Ambulatory Visit: Payer: Self-pay

## 2021-06-15 ENCOUNTER — Ambulatory Visit (INDEPENDENT_AMBULATORY_CARE_PROVIDER_SITE_OTHER): Payer: Medicare Other | Admitting: Pharmacist

## 2021-06-15 DIAGNOSIS — E669 Obesity, unspecified: Secondary | ICD-10-CM

## 2021-06-15 DIAGNOSIS — J4520 Mild intermittent asthma, uncomplicated: Secondary | ICD-10-CM

## 2021-06-15 DIAGNOSIS — I10 Essential (primary) hypertension: Secondary | ICD-10-CM | POA: Diagnosis not present

## 2021-06-15 DIAGNOSIS — E785 Hyperlipidemia, unspecified: Secondary | ICD-10-CM

## 2021-06-15 DIAGNOSIS — N183 Chronic kidney disease, stage 3 unspecified: Secondary | ICD-10-CM | POA: Diagnosis not present

## 2021-06-15 DIAGNOSIS — E1169 Type 2 diabetes mellitus with other specified complication: Secondary | ICD-10-CM | POA: Diagnosis not present

## 2021-06-15 NOTE — Telephone Encounter (Signed)
I do not see that Gi issues was dicussed during her June OV. Would you like for pt to come in to discuss first?

## 2021-06-15 NOTE — Patient Instructions (Signed)
Visit Information  Phone number for Pharmacist: [phone]   Goals Addressed             This Visit's Progress    Manage My Medicine       Timeframe:  Long-Range Goal Priority:  High Start Date:    03/13/21                         Expected End Date:    09/13/21                   Follow Up Date 07/07/21   - call for medicine refill 2 or 3 days before it runs out - call if I am sick and can't take my medicine - keep a list of all the medicines I take; vitamins and herbals too  -Hold Trulicity until GI issues are addressed   Why is this important?   These steps will help you keep on track with your medicines.   Notes:         Care Plan : [place]  Updates made by Charlton Haws, RPH since 06/15/2021 12:00 AM     Problem: Hypertension, Hyperlipidemia, Diabetes, Asthma, and Chronic Kidney Disease, Chronic pain   Priority: High     Long-Range Goal: Disease management   Start Date: 09/13/2021  This Visit's Progress: Not on track  Recent Progress: On track  Priority: High  Note:   Current Barriers:  Unable to independently afford treatment regimen Unable to independently monitor therapeutic efficacy Does not adhere to prescribed medication regimen  Pharmacist Clinical Goal(s):  Patient will verbalize ability to afford treatment regimen achieve adherence to monitoring guidelines and medication adherence to achieve therapeutic efficacy adhere to prescribed medication regimen as evidenced by patient report through collaboration with PharmD and provider.   Interventions: 1:1 collaboration with [name], MD regarding development and update of comprehensive plan of care as evidenced by provider attestation and co-signature Inter-disciplinary care team collaboration (see longitudinal plan of care) Comprehensive medication review performed; medication list updated in electronic medical record  Hypertension    BP goal is:  <130/80 Patient checks  BP at home infrequently Patient home BP readings are ranging: n/a   Patient has failed these meds in the past: chlorthalidone, furosemide, Bystolic, spironolactone, verapamil, nebivolol Patient is currently controlled on the following medications:  Triamterence-HCTZ 37.5-25 mg daily  We discussed: pt endorses compliance with medication and reports BP is usually "good" at pain clinic office visits and when she occasionally checks at the pharmacy; most recent office BP is also at goal   Plan: Continue current medications    Hyperlipidemia    LDL goal < 100 Patient has failed these meds in past: n/a Patient is currently controlled on the following medications:  Rosuvastatin 20 mg daily   We discussed:  LDL is at goal (improved from 124 > 57 after starting statin); pt endorses compliance and denies side effects   Plan: Continue current medications and control with diet and exercise    Diabetes / CKD    A1c goal <7% Patient has failed these meds in past: [place] Patient is currently uncontrolled on the following medications: Trulicity 1.5 mg weekly - not started   We discussed: Pt has not started Trulicity; she has been having stomach problems - she reports back pain worsens after eating; this morning her breakfast "came back up" shortly after eating; discussed that this could be reflux, gastroparesis or  something else; advised to follow up with PCP   Plan: Follow up with PCP re: stomach issues   Asthma / Allergies    Last spirometry score: n/a   Patient has failed these meds in past: n/a Patient is currently controlled on the following medications:  Symbicort 80-4.5 mcg/act  Albuterol HFA PRN Levocetirizine 5 mg qPM   Using maintenance inhaler regularly? No Frequency of rescue inhaler use:  never   We discussed: Pt was able to get Symbciort free through AZ&Me; pt denies issues  Plan: Recommend to continue current medication    Patient Goals/Self-Care Activities Patient  will:  - take medications as prescribed -focus on medication adherence by routine -check blood pressure 3 times a week, document, and provide at future appointments  -Target 150 min/week of exercise -Hold Trulicity until GI issues addressed -Make appt with PCP for GI issues      The patient verbalized understanding of instructions, educational materials, and care plan provided today and declined offer to receive copy of patient instructions, educational materials, and care plan.  Telephone follow up appointment with pharmacy team member scheduled for: 1 month  Charlene Brooke, PharmD, [place], CPP Clinical Pharmacist [facility] [phone]

## 2021-06-15 NOTE — Telephone Encounter (Addendum)
   Patient requesting referral to GI Patient states she was advised by Pharmacist to request referral due to ongoing "stomach issues"  Declined appointment at this time

## 2021-06-15 NOTE — Progress Notes (Signed)
Chronic Care Management Pharmacy Note  06/15/2021 Name:  [name] MRN:  [id number] DOB:  01/11/1961  Summary: -Pt has NOT started Trulicity due to recent GI issues - nausea/vomiting after eating, back pain after eating.   Recommendations/Changes made from today's visit: -Hold starting Trulicity until GI issues are addressed -Advised PCP visit to address GI issues   Subjective: [name] is an 60 y.o. year old female who is a primary patient of [name], MD.  The CCM team was consulted for assistance with disease management and care coordination needs.    Engaged with patient by telephone for follow up visit in response to provider referral for pharmacy case management and/or care coordination services.   Consent to Services:  The patient was given information about Chronic Care Management services, agreed to services, and gave verbal consent prior to initiation of services.  Please see initial visit note for detailed documentation.   Patient Care Team: [name], MD as PCP - General Buford Dresser, MD as PCP - Cardiology (Cardiology) Kennith Gain, MD as Consulting Physician (Allergy) Kennith Gain, MD as Consulting Physician (Allergy) Magnus Sinning, MD as Consulting Physician (Physical Medicine and Rehabilitation) Charlton Haws, [facility] as Pharmacist (Pharmacist)   Patient is currently living in a hotel while she searches for alternate housing. She is no longer living on [facility].  Recent office visits: 04/16/21 Dr Ronnald Ramp OV: chronic f/u; tinea pedis/onychomycosis - rx'd terbinafine 250 mg. Rx'd Potassium 10 meq (K 3.4). F/U 3 months.  09/17/20 Dr Ronnald Ramp OV: chronic f/u; Rx'd Farxiga, Bystolic 10 mg. F/U 3 months.  Recent consult visits: 09/24/20 Dr Vira Blanco (pain med): f/u spondylosis  Hospital visits: None in previous 6 months  Objective:  Lab Results  Component Value Date   CREATININE 1.53 (H) 04/16/2021    BUN 24 (H) 04/16/2021   GFR 36.94 (L) 04/16/2021   GFRNONAA 40 (L) 10/17/2019   GFRAA 46 (L) 10/17/2019   NA 142 04/16/2021   K 3.4 (L) 04/16/2021   CALCIUM 9.9 04/16/2021   CO2 30 04/16/2021   GLUCOSE 86 04/16/2021    Lab Results  Component Value Date/Time   HGBA1C 6.6 (H) 04/16/2021 02:49 PM   HGBA1C 6.7 (H) 09/17/2020 03:52 PM   GFR 36.94 (L) 04/16/2021 02:49 PM   GFR 29.34 (L) 09/17/2020 03:52 PM   MICROALBUR 1.0 09/17/2020 03:52 PM   MICROALBUR <0.7 10/18/2019 04:18 PM    Last diabetic Eye exam:  Lab Results  Component Value Date/Time   HMDIABEYEEXA No Retinopathy 08/20/2020 12:00 AM    Last diabetic Foot exam: No results found for: HMDIABFOOTEX   Lab Results  Component Value Date   CHOL 122 09/17/2020   HDL 48.90 09/17/2020   LDLCALC 57 09/17/2020   TRIG 78.0 09/17/2020   CHOLHDL 2 09/17/2020    Hepatic Function Latest Ref Rng & Units 04/16/2021 09/17/2020 08/09/2019  Total Protein 6.0 - 8.3 g/dL 8.2 7.5 8.6(H)  Albumin 3.5 - 5.2 g/dL 4.1 4.0 3.9  AST 0 - 37 U/L 16 18 26   ALT 0 - 35 U/L 15 17 25   Alk Phosphatase 39 - 117 U/L 120(H) 127(H) 130(H)  Total Bilirubin 0.2 - 1.2 mg/dL 0.3 0.3 0.6  Bilirubin, Direct 0.0 - 0.3 mg/dL 0.1 0.0 -    Lab Results  Component Value Date/Time   TSH 0.94 09/17/2020 03:52 PM   TSH 2.80 07/11/2019 09:04 AM    CBC Latest Ref Rng & Units 09/17/2020 10/17/2019  08/09/2019  WBC 4.0 - 10.5 K/uL 6.1 5.4 9.1  Hemoglobin 12.0 - 15.0 g/dL 13.7 13.4 13.8  Hematocrit 36.0 - 46.0 % 42.3 42.8 42.6  Platelets 150.0 - 400.0 K/uL 197.0 260 261    No results found for: VD25OH  Clinical ASCVD: No  The ASCVD Risk score Mikey Bussing DC Jr., et al., 2013) failed to calculate for the following reasons:   The valid total cholesterol range is 130 to 320 mg/dL    Depression screen [facility] 2/9 04/16/2021 09/07/2019 07/04/2019  Decreased Interest 0 0 0  Down, Depressed, Hopeless 0 1 0  PHQ - 2 Score 0 1 0  Altered sleeping - - -  Tired, decreased energy - -  -  Change in appetite - - -  Feeling bad or failure about yourself  - - -  Trouble concentrating - - -  Moving slowly or fidgety/restless - - -  Suicidal thoughts - - -  PHQ-9 Score - - -  Difficult doing work/chores - - -  Some recent data might be hidden     Social History   Tobacco Use  Smoking Status Passive Smoke Exposure - Never Smoker  Smokeless Tobacco Never  Tobacco Comments   husband smokes outside and inside the home   BP Readings from Last 3 Encounters:  04/16/21 132/88  09/17/20 (!) 142/102  04/17/20 126/88   Pulse Readings from Last 3 Encounters:  04/16/21 73  09/17/20 90  04/17/20 82   Wt Readings from Last 3 Encounters:  04/16/21 230 lb 6.4 oz (104.5 kg)  09/17/20 235 lb (106.6 kg)  04/17/20 233 lb 4 oz (105.8 kg)   BMI Readings from Last 3 Encounters:  04/16/21 43.53 kg/m  09/17/20 44.40 kg/m  04/17/20 44.07 kg/m    Assessment/Interventions: Review of patient past medical history, allergies, medications, health status, including review of consultants reports, laboratory and other test data, was performed as part of comprehensive evaluation and provision of chronic care management services.   SDOH:  (Social Determinants of Health) assessments and interventions performed: Yes  SDOH Screenings   Alcohol Screen: Not on file  Depression (PHQ2-9): Low Risk    PHQ-2 Score: 0  Financial Resource Strain: Not on file  Food Insecurity: Not on file  Housing: Not on file  Physical Activity: Not on file  Social Connections: Not on file  Stress: Not on file  Tobacco Use: Medium Risk   Smoking Tobacco Use: Passive Smoke Exposure - Never Smoker   Smokeless Tobacco Use: Never  Transportation Needs: Not on file    [place]  Allergies  Allergen Reactions   Verapamil Swelling    Medications Reviewed Today     Reviewed by Charlton Haws, [facility] (Pharmacist) on 06/15/21 at 1143  Med List Status: <None>   Medication Order Taking? Sig  Documenting Provider Last Dose Status Informant  Accu-Chek FastClix Lancets MISC [id number] Yes USE 1  TO CHECK GLUCOSE TWICE DAILY [name], MD Taking Active   albuterol (PROVENTIL HFA;VENTOLIN HFA) 108 (90 Base) MCG/ACT inhaler [id number] Yes Inhale 2 puffs into the lungs every 4 (four) hours as needed for wheezing or shortness of breath (cough, shortness of breath or wheezing.). Robyn Haber, MD Taking Active Self  blood glucose meter kit and supplies KIT [id number] Yes Use to test blood sugar twice daily. DX: E11.8 [name], MD Taking Active Self  budesonide-formoterol [facility]) 80-4.5 MCG/ACT inhaler [id number] Yes Inhale 2 puffs into the lungs 2 (two) times daily as  needed (shortness of breath, wheezing). Via [place] [employer] [place] patient assistance [name], MD Taking Active   Dulaglutide (TRULICITY) 1.24 PY/0.9XI SOPN [id number] No Inject 0.75 mg into the skin once a week.  Patient not taking: Reported on 06/15/2021   [name], MD Not Taking Active            Med Note Luna Glasgow Jun 15, 2021 11:42 AM) Not started yet due to recent GI issues  glucose blood (ACCU-CHEK GUIDE) test strip [id number] Yes Use as instructed twice daily. [name], MD Taking Active   levocetirizine (XYZAL) 5 MG tablet [id number] Yes TAKE 1 TABLET BY MOUTH EVERY EVENING [name], MD Taking Active   potassium chloride (KLOR-CON 10) 10 MEQ tablet [id number] Yes Take 1 tablet (10 mEq total) by mouth 2 (two) times daily. [name], MD Taking Active   rosuvastatin (CRESTOR) 20 MG tablet [id number] Yes Take 1 tablet by mouth once daily [name], MD Taking Active   terbinafine (LAMISIL) 250 MG tablet [id number] Yes Take 1 tablet (250 mg total) by mouth daily. [name], MD Taking Active   tiZANidine (ZANAFLEX) 4 MG tablet [id number] Yes Take 4 mg by mouth as needed for muscle spasms. [provider] Taking Active   traMADol (ULTRAM) 50 MG tablet [id number] Yes  Take 1 tablet (50 mg total) by mouth every 6 (six) hours as needed. Biagio Borg, MD Taking Active   triamcinolone cream (KENALOG) 0.5 % [id number] Yes Apply 1 application topically 3 (three) times daily. [name], MD Taking Active   triamterene-hydrochlorothiazide (DYAZIDE) 37.5-25 MG capsule [id number] Yes Take 1 each (1 capsule total) by mouth daily. [name], MD Taking Active             Patient Active Problem List   Diagnosis Date Noted   Tinea pedis of both feet 04/16/2021   Onychomycosis of toenail 04/16/2021   Prolonged Q-T interval on ECG 10/18/2019   Dermal hypersensitivity reaction 07/11/2019   Type 2 diabetes mellitus with obesity ([place]) 07/11/2019   Intrinsic eczema 07/04/2019   CKD (chronic kidney disease) stage 3, GFR 30-59 ml/min (HCC) 10/11/2018   Class 3 severe obesity due to excess calories with serious comorbidity and body mass index (BMI) of 40.0 to 44.9 in adult ([place]) 09/14/2018   Chronic right-sided low back pain with right-sided sciatica 09/14/2018   Hyperlipidemia with target LDL less than 130 07/05/2018   Mild intermittent asthma without complication 78/67/6720   Snoring 12/14/2016   Essential hypertension 12/14/2016   Gastroesophageal reflux disease with esophagitis 08/26/2016   Dysphagia 08/26/2016   Spondylolisthesis of lumbar region 05/28/2016   Trochanteric bursitis of left hip 02/09/2016   Trochanteric bursitis, right hip 01/12/2016   Fibromyalgia 08/08/2015   Lumbar facet arthropathy 08/08/2015   Primary osteoarthritis of both knees 08/25/2014   Routine general medical examination at a health care facility 06/01/2013   Encounter for screening mammogram for breast cancer 06/01/2013   Abnormal electrocardiogram 02/05/2010   ALLERGIC RHINITIS 01/26/2010    Immunization History  Administered Date(s) Administered   Influenza Split 09/27/2012   Influenza,inj,Quad PF,6+ Mos 08/23/2014, 11/26/2015, 08/26/2016, 07/05/2018, 07/04/2019,  09/17/2020   PFIZER(Purple Top)SARS-COV-2 Vaccination 01/07/2020, 02/13/2020   PNEUMOCOCCAL CONJUGATE-20 04/16/2021   PPD Test 08/23/2014   Pneumococcal Polysaccharide-23 09/27/2012, 04/17/2020   Tdap 06/07/2010, 07/19/2019    Conditions to be addressed/monitored:  Hypertension, Hyperlipidemia, Diabetes, Asthma, and Chronic Kidney Disease, Chronic pain  Care  Plan : [place]  Updates made by Charlton Haws, RPH since 06/15/2021 12:00 AM     Problem: Hypertension, Hyperlipidemia, Diabetes, Asthma, and Chronic Kidney Disease, Chronic pain   Priority: High     Long-Range Goal: Disease management   Start Date: 09/13/2021  This Visit's Progress: Not on track  Recent Progress: On track  Priority: High  Note:   Current Barriers:  Unable to independently afford treatment regimen Unable to independently monitor therapeutic efficacy Does not adhere to prescribed medication regimen  Pharmacist Clinical Goal(s):  Patient will verbalize ability to afford treatment regimen achieve adherence to monitoring guidelines and medication adherence to achieve therapeutic efficacy adhere to prescribed medication regimen as evidenced by patient report through collaboration with PharmD and provider.   Interventions: 1:1 collaboration with [name], MD regarding development and update of comprehensive plan of care as evidenced by provider attestation and co-signature Inter-disciplinary care team collaboration (see longitudinal plan of care) Comprehensive medication review performed; medication list updated in electronic medical record  Hypertension    BP goal is:  <130/80 Patient checks BP at home infrequently Patient home BP readings are ranging: n/a   Patient has failed these meds in the past: chlorthalidone, furosemide, Bystolic, spironolactone, verapamil, nebivolol Patient is currently controlled on the following medications:  Triamterence-HCTZ 37.5-25 mg daily  We  discussed: pt endorses compliance with medication and reports BP is usually "good" at pain clinic office visits and when she occasionally checks at the pharmacy; most recent office BP is also at goal   Plan: Continue current medications    Hyperlipidemia    LDL goal < 100 Patient has failed these meds in past: n/a Patient is currently controlled on the following medications:  Rosuvastatin 20 mg daily   We discussed:  LDL is at goal (improved from 124 > 57 after starting statin); pt endorses compliance and denies side effects   Plan: Continue current medications and control with diet and exercise    Diabetes / CKD    A1c goal <7% Patient has failed these meds in past: [place] Patient is currently uncontrolled on the following medications: Trulicity 1.5 mg weekly - not started   We discussed: Pt has not started Trulicity; she has been having stomach problems - she reports back pain worsens after eating; this morning her breakfast "came back up" shortly after eating; discussed that this could be reflux, gastroparesis or something else; advised to follow up with PCP   Plan: Follow up with PCP re: stomach issues   Asthma / Allergies    Last spirometry score: n/a   Patient has failed these meds in past: n/a Patient is currently controlled on the following medications:  Symbicort 80-4.5 mcg/act  Albuterol HFA PRN Levocetirizine 5 mg qPM   Using maintenance inhaler regularly? No Frequency of rescue inhaler use:  never   We discussed: Pt was able to get Symbciort free through [employer]; pt denies issues  Plan: Recommend to continue current medication    Patient Goals/Self-Care Activities Patient will:  - take medications as prescribed -focus on medication adherence by routine -check blood pressure 3 times a week, document, and provide at future appointments  -Target 150 min/week of exercise -Hold Trulicity until GI issues addressed -Make appt with PCP for GI issues        Medication Assistance:  Wilder Glade obtained through [employer] medication assistance program.  Enrollment ends 11/07/21  Compliance/Adherence/Medication fill history: Care Gaps: Shingrix Covid booster (due 03/12/20) Pneumococcal (PPSV)  Star-Rating Drugs: Rosuvastatin - LF 02/16/21 x 90 ds  Patient's preferred pharmacy is:  [facility] ([place]), [place] - [address] [place]  [address] Phone: [phone] Fax: [phone]  [facility] [place], [place] [address] Phone: [phone] Fax: [phone]  [facility] - [address], [place] [address] Phone: [phone] Fax: [phone]  Uses pill box? No - prefers bottle Pt endorses 99% compliance  We discussed: Current pharmacy is preferred with insurance plan and patient is satisfied with pharmacy services Patient decided to: Continue current medication management strategy  Care Plan and Follow Up Patient Decision:  Patient agrees to Care Plan and Follow-up.  Plan: Telephone follow up appointment with care management team member scheduled for:  1 month  Charlene Brooke, PharmD, [place], CPP Clinical Pharmacist [facility] [phone]

## 2021-06-16 ENCOUNTER — Telehealth: Payer: Self-pay | Admitting: Pharmacist

## 2021-06-16 NOTE — Progress Notes (Signed)
Called patient to see if she has provided income info to [place], CPP at  [facility] for her trulicity PAP. Left a detailed voice message asking patient to call me back.  Orinda Kenner, [place] Clinical Pharmacists Assistant [phone]  Time Spent: [phone]

## 2021-06-22 NOTE — Telephone Encounter (Signed)
I have attempted to call pt, phone rings twice then gives a busy signal. Will try again this afternoon.

## 2021-06-22 NOTE — Telephone Encounter (Signed)
I have attempted to reach out to pt again, received the same busy signal after 2 rings.

## 2021-07-21 ENCOUNTER — Ambulatory Visit (INDEPENDENT_AMBULATORY_CARE_PROVIDER_SITE_OTHER): Payer: Medicare Other | Admitting: Pharmacist

## 2021-07-21 ENCOUNTER — Other Ambulatory Visit: Payer: Self-pay

## 2021-07-21 DIAGNOSIS — E669 Obesity, unspecified: Secondary | ICD-10-CM

## 2021-07-21 DIAGNOSIS — J4520 Mild intermittent asthma, uncomplicated: Secondary | ICD-10-CM

## 2021-07-21 DIAGNOSIS — E785 Hyperlipidemia, unspecified: Secondary | ICD-10-CM

## 2021-07-21 DIAGNOSIS — E119 Type 2 diabetes mellitus without complications: Secondary | ICD-10-CM

## 2021-07-21 DIAGNOSIS — E1169 Type 2 diabetes mellitus with other specified complication: Secondary | ICD-10-CM

## 2021-07-21 DIAGNOSIS — I10 Essential (primary) hypertension: Secondary | ICD-10-CM

## 2021-07-21 DIAGNOSIS — N183 Chronic kidney disease, stage 3 unspecified: Secondary | ICD-10-CM

## 2021-07-21 NOTE — Progress Notes (Signed)
Chronic Care Management Pharmacy Note  07/21/2021 Name:  [name] MRN:  [id number] DOB:  [birth year]  Summary: -Pt has NOT started Trulicity due to recent GI issues - nausea/vomiting after eating, back pain after eating.   Recommendations/Changes made from today's visit: -Hold starting Trulicity until GI issues are addressed -Advised PCP visit to address GI issues   Subjective: [name] is an 60 y.o. year old female who is a primary patient of [name], MD.  The CCM team was consulted for assistance with disease management and care coordination needs.    Engaged with patient by telephone for follow up visit in response to provider referral for pharmacy case management and/or care coordination services.   Consent to Services:  The patient was given information about Chronic Care Management services, agreed to services, and gave verbal consent prior to initiation of services.  Please see initial visit note for detailed documentation.   Patient Care Team: [name], MD as PCP - General Buford Dresser, MD as PCP - Cardiology (Cardiology) Kennith Gain, MD as Consulting Physician (Allergy) Magnus Sinning, MD as Consulting Physician (Physical Medicine and Rehabilitation) Charlton Haws, [facility] as Pharmacist (Pharmacist)   Patient is currently living in a hotel while she searches for alternate housing. She is no longer living on [facility].  Recent office visits: 05/07/21 pt call - Farxiga side effects (GI); d/c Farxiga and Rx'd Mounjaro. PA was denied so switched to Trulicity.  04/16/21 Dr Ronnald Ramp OV: chronic f/u; tinea pedis/onychomycosis - rx'd terbinafine 250 mg. Rx'd Potassium 10 meq (K 3.4). F/U 3 months.  09/17/20 Dr Ronnald Ramp OV: chronic f/u; Rx'd Farxiga, Bystolic 10 mg. F/U 3 months.  Recent consult visits: 09/24/20 Dr Vira Blanco (pain med): f/u spondylosis  Hospital visits: None in previous 6 months  Objective:  Lab Results   Component Value Date   CREATININE 1.53 (H) 04/16/2021   BUN 24 (H) 04/16/2021   GFR 36.94 (L) 04/16/2021   GFRNONAA 40 (L) 10/17/2019   GFRAA 46 (L) 10/17/2019   NA 142 04/16/2021   K 3.4 (L) 04/16/2021   CALCIUM 9.9 04/16/2021   CO2 30 04/16/2021   GLUCOSE 86 04/16/2021    Lab Results  Component Value Date/Time   HGBA1C 6.6 (H) 04/16/2021 02:49 PM   HGBA1C 6.7 (H) 09/17/2020 03:52 PM   GFR 36.94 (L) 04/16/2021 02:49 PM   GFR 29.34 (L) 09/17/2020 03:52 PM   MICROALBUR 1.0 09/17/2020 03:52 PM   MICROALBUR <0.7 10/18/2019 04:18 PM    Last diabetic Eye exam:  Lab Results  Component Value Date/Time   HMDIABEYEEXA No Retinopathy 08/20/2020 12:00 AM    Last diabetic Foot exam: No results found for: HMDIABFOOTEX   Lab Results  Component Value Date   CHOL 122 09/17/2020   HDL 48.90 09/17/2020   LDLCALC 57 09/17/2020   TRIG 78.0 09/17/2020   CHOLHDL 2 09/17/2020    Hepatic Function Latest Ref Rng & Units 04/16/2021 09/17/2020 08/09/2019  Total Protein 6.0 - 8.3 g/dL 8.2 7.5 8.6(H)  Albumin 3.5 - 5.2 g/dL 4.1 4.0 3.9  AST 0 - 37 U/L _0 ALT 0 - 35 U/L _1 Alk Phosphatase 39 - 117 U/L 120(H) 127(H) 130(H)  Total Bilirubin 0.2 - 1.2 mg/dL 0.3 0.3 0.6  Bilirubin, Direct 0.0 - 0.3 mg/dL 0.1 0.0 -    Lab Results  Component Value Date/Time   TSH 0.94 09/17/2020 03:52 PM   TSH 2.80 07/11/2019  09:04 AM    CBC Latest Ref Rng & Units 09/17/2020 10/17/2019 08/09/2019  WBC 4.0 - 10.5 K/uL 6.1 5.4 9.1  Hemoglobin 12.0 - 15.0 g/dL 13.7 13.4 13.8  Hematocrit 36.0 - 46.0 % 42.3 42.8 42.6  Platelets 150.0 - 400.0 K/uL 197.0 260 261    No results found for: VD25OH  Clinical ASCVD: No  The ASCVD Risk score (Arnett DK, et al., 2019) failed to calculate for the following reasons:   The valid total cholesterol range is 130 to 320 mg/dL    Depression screen [facility] 2/9 04/16/2021 09/07/2019 07/04/2019  Decreased Interest 0 0 0  Down, Depressed, Hopeless 0 1 0  PHQ - 2 Score 0 1  0  Altered sleeping - - -  Tired, decreased energy - - -  Change in appetite - - -  Feeling bad or failure about yourself  - - -  Trouble concentrating - - -  Moving slowly or fidgety/restless - - -  Suicidal thoughts - - -  PHQ-9 Score - - -  Difficult doing work/chores - - -  Some recent data might be hidden     Social History   Tobacco Use  Smoking Status Passive Smoke Exposure - Never Smoker  Smokeless Tobacco Never  Tobacco Comments   husband smokes outside and inside the home   BP Readings from Last 3 Encounters:  04/16/21 132/88  09/17/20 (!) 142/102  04/17/20 126/88   Pulse Readings from Last 3 Encounters:  04/16/21 73  09/17/20 90  04/17/20 82   Wt Readings from Last 3 Encounters:  04/16/21 230 lb 6.4 oz (104.5 kg)  09/17/20 235 lb (106.6 kg)  04/17/20 233 lb 4 oz (105.8 kg)   BMI Readings from Last 3 Encounters:  04/16/21 43.53 kg/m  09/17/20 44.40 kg/m  04/17/20 44.07 kg/m    Assessment/Interventions: Review of patient past medical history, allergies, medications, health status, including review of consultants reports, laboratory and other test data, was performed as part of comprehensive evaluation and provision of chronic care management services.   SDOH:  (Social Determinants of Health) assessments and interventions performed: Yes  SDOH Screenings   Alcohol Screen: Not on file  Depression (PHQ2-9): Low Risk    PHQ-2 Score: 0  Financial Resource Strain: Not on file  Food Insecurity: Not on file  Housing: Not on file  Physical Activity: Not on file  Social Connections: Not on file  Stress: Not on file  Tobacco Use: Medium Risk   Smoking Tobacco Use: Passive Smoke Exposure - Never Smoker   Smokeless Tobacco Use: Never  Transportation Needs: Not on file    [place]  Allergies  Allergen Reactions   Verapamil Swelling    Medications Reviewed Today     Reviewed by Charlton Haws, [facility] (Pharmacist) on 07/21/21 at [place]  List Status: <None>   Medication Order Taking? Sig Documenting Provider Last Dose Status Informant  Accu-Chek FastClix Lancets MISC [id number] Yes USE 1  TO CHECK GLUCOSE TWICE DAILY [name], MD Taking Active   albuterol (PROVENTIL HFA;VENTOLIN HFA) 108 (90 Base) MCG/ACT inhaler [id number] Yes Inhale 2 puffs into the lungs every 4 (four) hours as needed for wheezing or shortness of breath (cough, shortness of breath or wheezing.). Robyn Haber, MD Taking Active Self  blood glucose meter kit and supplies KIT [id number] Yes Use to test blood sugar twice daily. DX: E11.8 [name], MD Taking Active Self  budesonide-formoterol [facility]) 80-4.5 MCG/ACT inhaler [id number]  Yes Inhale 2 puffs into the lungs 2 (two) times daily as needed (shortness of breath, wheezing). Via [place] [employer] [place] patient assistance [name], MD Taking Active   glucose blood (ACCU-CHEK GUIDE) test strip [id number] Yes Use as instructed twice daily. [name], MD Taking Active   levocetirizine (XYZAL) 5 MG tablet [id number] Yes TAKE 1 TABLET BY MOUTH EVERY EVENING [name], MD Taking Active   potassium chloride (KLOR-CON 10) 10 MEQ tablet [id number] Yes Take 1 tablet (10 mEq total) by mouth 2 (two) times daily. [name], MD Taking Active   rosuvastatin (CRESTOR) 20 MG tablet [id number] Yes Take 1 tablet by mouth once daily [name], MD Taking Active   tiZANidine (ZANAFLEX) 4 MG tablet [id number] Yes Take 4 mg by mouth as needed for muscle spasms. [provider] Taking Active   traMADol (ULTRAM) 50 MG tablet [id number] No Take 1 tablet (50 mg total) by mouth every 6 (six) hours as needed.  Patient not taking: Reported on 07/21/2021   Biagio Borg, MD Not Taking Active   triamcinolone cream (KENALOG) 0.5 % [id number] Yes Apply 1 application topically 3 (three) times daily. [name], MD Taking Active   triamterene-hydrochlorothiazide (DYAZIDE) 37.5-25 MG capsule [id number] Yes Take 1  each (1 capsule total) by mouth daily. [name], MD Taking Active             Patient Active Problem List   Diagnosis Date Noted   Tinea pedis of both feet 04/16/2021   Onychomycosis of toenail 04/16/2021   Prolonged Q-T interval on ECG 10/18/2019   Dermal hypersensitivity reaction 07/11/2019   Type 2 diabetes mellitus with obesity ([place]) 07/11/2019   Intrinsic eczema 07/04/2019   CKD (chronic kidney disease) stage 3, GFR 30-59 ml/min (HCC) 10/11/2018   Class 3 severe obesity due to excess calories with serious comorbidity and body mass index (BMI) of 40.0 to 44.9 in adult ([place]) 09/14/2018   Chronic right-sided low back pain with right-sided sciatica 09/14/2018   Hyperlipidemia with target LDL less than 130 07/05/2018   Mild intermittent asthma without complication 48/27/0786   Snoring 12/14/2016   Essential hypertension 12/14/2016   Gastroesophageal reflux disease with esophagitis 08/26/2016   Dysphagia 08/26/2016   Spondylolisthesis of lumbar region 05/28/2016   Trochanteric bursitis of left hip 02/09/2016   Trochanteric bursitis, right hip 01/12/2016   Fibromyalgia 08/08/2015   Lumbar facet arthropathy 08/08/2015   Primary osteoarthritis of both knees 08/25/2014   Routine general medical examination at a health care facility 06/01/2013   Encounter for screening mammogram for breast cancer 06/01/2013   Abnormal electrocardiogram 02/05/2010   ALLERGIC RHINITIS 01/26/2010    Immunization History  Administered Date(s) Administered   Influenza Split 09/27/2012   Influenza,inj,Quad PF,6+ Mos 08/23/2014, 11/26/2015, 08/26/2016, 07/05/2018, 07/04/2019, 09/17/2020   PFIZER(Purple Top)SARS-COV-2 Vaccination 01/07/2020, 02/13/2020   PNEUMOCOCCAL CONJUGATE-20 04/16/2021   PPD Test 08/23/2014   Pneumococcal Polysaccharide-23 09/27/2012, 04/17/2020   Tdap 06/07/2010, 07/19/2019    Conditions to be addressed/monitored:  Hypertension, Hyperlipidemia, Diabetes, Asthma, and  Chronic Kidney Disease, Chronic pain  Care Plan : [place]  Updates made by Charlton Haws, [place] since 07/21/2021 12:00 AM     Problem: Hypertension, Hyperlipidemia, Diabetes, Asthma, and Chronic Kidney Disease, Chronic pain   Priority: High     Long-Range Goal: Disease management   Start Date: 09/13/2021  This Visit's Progress: Not on track  Recent Progress: Not on track  Priority: High  Note:   Current Barriers:  Unable to independently afford treatment regimen Unable to independently monitor therapeutic efficacy Does not adhere to prescribed medication regimen  Pharmacist Clinical Goal(s):  Patient will verbalize ability to afford treatment regimen achieve adherence to monitoring guidelines and medication adherence to achieve therapeutic efficacy adhere to prescribed medication regimen as evidenced by patient report through collaboration with PharmD and provider.   Interventions: 1:1 collaboration with [name], MD regarding development and update of comprehensive plan of care as evidenced by provider attestation and co-signature Inter-disciplinary care team collaboration (see longitudinal plan of care) Comprehensive medication review performed; medication list updated in electronic medical record  Hypertension    BP goal is:  <130/80 Patient checks BP at home infrequently Patient home BP readings are ranging: n/a   Patient has failed these meds in the past: chlorthalidone, furosemide, Bystolic, spironolactone, verapamil, nebivolol Patient is currently controlled on the following medications:  Triamterence-HCTZ 37.5-25 mg daily  We discussed: pt endorses compliance with medication and reports BP is usually "good" at pain clinic office visits and when she occasionally checks at the pharmacy; most recent office BP is also at goal   Plan: Continue current medications    Hyperlipidemia    LDL goal < 100 Patient has failed these meds in past:  n/a Patient is currently controlled on the following medications:  Rosuvastatin 20 mg daily   We discussed:  LDL is at goal (improved from 124 > 57 after starting statin); pt endorses compliance and denies side effects   Plan: Continue current medications and control with diet and exercise    Diabetes / CKD    A1c goal <7% Patient has failed these meds in past: [place] Patient is currently uncontrolled on the following medications: Trulicity 1.5 mg weekly - not started   We discussed: Pt has not started Trulicity; she has been having stomach problems - she reports back pain worsens after eating; this morning her breakfast "came back up" shortly after eating; discussed that this could be reflux, gastroparesis or something else; advised to follow up with PCP   Plan: Follow up with PCP re: stomach issues   Asthma / Allergies    Last spirometry score: n/a   Patient has failed these meds in past: n/a Patient is currently controlled on the following medications:  Symbicort 80-4.5 mcg/act  Albuterol HFA PRN Levocetirizine 5 mg qPM   Using maintenance inhaler regularly? No Frequency of rescue inhaler use:  never   We discussed: Pt was able to get Symbciort free through AZ&Me; pt denies issues  Plan: Recommend to continue current medication  Back pain   -Not ideally controlled - pt is seeing pain management and their policy including monthly visits for refills; pt does not appreciate this policy and has been out of tramadol for a month now because she was charged 2 copays for 2 visits with 2 different providers in the same day -Current treatment  Tramadol 50 mg q6h PRN - not taking (out of refills) Tizanidine 4 mg PRN -Counseled patient that most pain clinics typically have policies similar to this one with monthly visits to ensure patient safety and to abide by national laws and guidelines surrounding pain medications. Advised that changing pain clinic providers may not be  helpful -Advised pt to discuss pain concerns with PCP  Nausea/vomiting   Pt reports she has had persistent problems with nausea and food "coming back up" for over a month now. Of note she has not started Trulicity  yet.  We discussed:  stomach issues could be gastroparesis, reflux or something else - advised to see PCP for further evaluation.  Plan: Transferred pt to scheduling team for PCP visit    Patient Goals/Self-Care Activities - take medications as prescribed -focus on medication adherence by routine -check blood pressure 3 times a week -Target 150 min/week of exercise -Hold Trulicity until GI issues addressed -Make appt with PCP for GI issues    Medication Assistance:  Symbicort - AZ&Me - approved through 56/43/32 Trulicity - [employer] - pt income needed.  Compliance/Adherence/Medication fill history: Care Gaps: Vaccines - Shingrix, covid booster, prevnar, flu  Star-Rating Drugs: Rosuvastatin - LF 02/16/21 x 90 ds  Patient's preferred pharmacy is:  [facility] ([place]), [place] - [address] [place] [address] Phone: [phone] Fax: [phone]  [facility] [place], [place] [address] Phone: [phone] Fax: [phone]  [facility] - [address], [place] [address] Phone: [phone] Fax: [phone]  Uses pill box? No - prefers bottle Pt endorses 99% compliance  We discussed: Current pharmacy is preferred with insurance plan and patient is satisfied with pharmacy services Patient decided to: Continue current medication management strategy  Care Plan and Follow Up Patient Decision:  Patient agrees to Care Plan and Follow-up.  Plan: Telephone follow up appointment with care management team member scheduled for:  2 months  Charlene Brooke, PharmD, [place], CPP Clinical Pharmacist [facility] at [facility] [phone]

## 2021-07-23 NOTE — Patient Instructions (Signed)
Visit Information  Phone number for Pharmacist: [phone]   Goals Addressed             This Visit's Progress    Manage My Medicine       Timeframe:  Long-Range Goal Priority:  High Start Date:    03/13/21                         Expected End Date:    04/13/22               Follow Up Date Nov 2022   - call for medicine refill 2 or 3 days before it runs out - call if I am sick and can't take my medicine - keep a list of all the medicines I take; vitamins and herbals too  -Hold Trulicity until GI issues are addressed   Why is this important?   These steps will help you keep on track with your medicines.   Notes:         Patient Care Plan: CCM Pharmacy Care Plan     Problem Identified: Hypertension, Hyperlipidemia, Diabetes, Asthma, and Chronic Kidney Disease, Chronic pain   Priority: High     Long-Range Goal: Disease management   Start Date: 09/13/2021  This Visit's Progress: Not on track  Recent Progress: Not on track  Priority: High  Note:   Current Barriers:  Unable to independently afford treatment regimen Unable to independently monitor therapeutic efficacy Does not adhere to prescribed medication regimen  Pharmacist Clinical Goal(s):  Patient will verbalize ability to afford treatment regimen achieve adherence to monitoring guidelines and medication adherence to achieve therapeutic efficacy adhere to prescribed medication regimen as evidenced by patient report through collaboration with PharmD and provider.   Interventions: 1:1 collaboration with [name], MD regarding development and update of comprehensive plan of care as evidenced by provider attestation and co-signature Inter-disciplinary care team collaboration (see longitudinal plan of care) Comprehensive medication review performed; medication list updated in electronic medical record  Hypertension    BP goal is:  <130/80 Patient checks BP at home infrequently Patient home BP readings  are ranging: n/a   Patient has failed these meds in the past: chlorthalidone, furosemide, Bystolic, spironolactone, verapamil, nebivolol Patient is currently controlled on the following medications:  Triamterence-HCTZ 37.5-25 mg daily  We discussed: pt endorses compliance with medication and reports BP is usually "good" at pain clinic office visits and when she occasionally checks at the pharmacy; most recent office BP is also at goal   Plan: Continue current medications    Hyperlipidemia    LDL goal < 100 Patient has failed these meds in past: n/a Patient is currently controlled on the following medications:  Rosuvastatin 20 mg daily   We discussed:  LDL is at goal (improved from 124 > 57 after starting statin); pt endorses compliance and denies side effects   Plan: Continue current medications and control with diet and exercise    Diabetes / CKD    A1c goal <7% Patient has failed these meds in past: [place] Patient is currently uncontrolled on the following medications: Trulicity 1.5 mg weekly - not started   We discussed: Pt has not started Trulicity; she has been having stomach problems - she reports back pain worsens after eating; this morning her breakfast "came back up" shortly after eating; discussed that this could be reflux, gastroparesis or something else; advised to follow up with PCP   Plan: Follow up  with PCP re: stomach issues   Asthma / Allergies    Last spirometry score: n/a   Patient has failed these meds in past: n/a Patient is currently controlled on the following medications:  Symbicort 80-4.5 mcg/act  Albuterol HFA PRN Levocetirizine 5 mg qPM   Using maintenance inhaler regularly? No Frequency of rescue inhaler use:  never   We discussed: Pt was able to get Symbciort free through AZ&Me; pt denies issues  Plan: Recommend to continue current medication  Back pain   -Not ideally controlled - pt is seeing pain management and their policy including  monthly visits for refills; pt does not appreciate this policy and has been out of tramadol for a month now because she was charged 2 copays for 2 visits with 2 different providers in the same day -Current treatment  Tramadol 50 mg q6h PRN - not taking (out of refills) Tizanidine 4 mg PRN -Counseled patient that most pain clinics typically have policies similar to this one with monthly visits to ensure patient safety and to abide by national laws and guidelines surrounding pain medications. Advised that changing pain clinic providers may not be helpful -Advised pt to discuss pain concerns with PCP  Nausea/vomiting   Pt reports she has had persistent problems with nausea and food "coming back up" for over a month now. Of note she has not started Trulicity yet.  We discussed:  stomach issues could be gastroparesis, reflux or something else - advised to see PCP for further evaluation.  Plan: Transferred pt to scheduling team for PCP visit    Patient Goals/Self-Care Activities - take medications as prescribed -focus on medication adherence by routine -check blood pressure 3 times a week -Target 150 min/week of exercise -Hold Trulicity until GI issues addressed -Make appt with PCP for GI issues     The patient verbalized understanding of instructions, educational materials, and care plan provided today and declined offer to receive copy of patient instructions, educational materials, and care plan.  Telephone follow up appointment with pharmacy team member scheduled for: 2 months  Charlene Brooke, PharmD, [place], CPP Clinical Pharmacist [facility] at [facility] [phone]

## 2021-07-27 ENCOUNTER — Other Ambulatory Visit: Payer: Self-pay | Admitting: Internal Medicine

## 2021-07-27 ENCOUNTER — Telehealth: Payer: Self-pay | Admitting: Internal Medicine

## 2021-07-27 DIAGNOSIS — M4316 Spondylolisthesis, lumbar region: Secondary | ICD-10-CM

## 2021-07-27 DIAGNOSIS — I10 Essential (primary) hypertension: Secondary | ICD-10-CM

## 2021-07-27 DIAGNOSIS — M47816 Spondylosis without myelopathy or radiculopathy, lumbar region: Secondary | ICD-10-CM

## 2021-07-27 DIAGNOSIS — E876 Hypokalemia: Secondary | ICD-10-CM

## 2021-07-27 DIAGNOSIS — M797 Fibromyalgia: Secondary | ICD-10-CM

## 2021-07-27 MED ORDER — POTASSIUM CHLORIDE ER 10 MEQ PO TBCR: 10.0000 meq | EXTENDED_RELEASE_TABLET | Freq: Two times a day (BID) | ORAL | 0 refills | Status: DC

## 2021-07-27 NOTE — Telephone Encounter (Signed)
1.Medication Requested: potassium chloride (KLOR-CON 10) 10 MEQ tablet terbinafine (LAMISIL) 250 MG tablet traMADol (ULTRAM) 50 MG tablet   2. Pharmacy (Name, Street, [place]): [facility] ([place]), [place] - [address]  Phone:  [phone] Fax:  [phone]   3. On Med List: yes  4. Last Visit with PCP: 06.09.22  5. Next visit date with PCP: n/a   Agent: Please be advised that RX refills may take up to 3 business days. We ask that you follow-up with your pharmacy.

## 2021-07-30 ENCOUNTER — Ambulatory Visit (INDEPENDENT_AMBULATORY_CARE_PROVIDER_SITE_OTHER): Payer: Medicare Other | Admitting: Emergency Medicine

## 2021-07-30 ENCOUNTER — Encounter: Payer: Self-pay | Admitting: Emergency Medicine

## 2021-07-30 ENCOUNTER — Other Ambulatory Visit: Payer: Self-pay

## 2021-07-30 VITALS — BP 138/88 | HR 79 | Temp 98.8°F | Ht 61.0 in | Wt 225.0 lb

## 2021-07-30 DIAGNOSIS — Z23 Encounter for immunization: Secondary | ICD-10-CM

## 2021-07-30 DIAGNOSIS — G894 Chronic pain syndrome: Secondary | ICD-10-CM | POA: Diagnosis not present

## 2021-07-30 DIAGNOSIS — M549 Dorsalgia, unspecified: Secondary | ICD-10-CM

## 2021-07-30 MED ORDER — KETOROLAC TROMETHAMINE 60 MG/2ML IM SOLN
60.0000 mg | Freq: Once | INTRAMUSCULAR | Status: AC
  Administered 2021-07-30: 60 mg via INTRAMUSCULAR

## 2021-07-30 NOTE — Progress Notes (Signed)
[name] 60 y.o.   Chief Complaint  Patient presents with   Back Pain    Stomach and back pain when pt eats and she states her legs are starting to ache. X 2 month    HISTORY OF PRESENT ILLNESS: Visit today for an acute problem. This is a 60 y.o. female with history of chronic back pain complaining of lumbar pain.  Patient presently taking tramadol and muscle relaxants on a daily basis.  Had vomiting yesterday but not today.  Also complaining of bilateral leg pains. States when she eats "stomach and back hurt".  Denies melena or rectal bleeding.  Denies hematemesis.  Denies fever or chills.  Denies flulike symptoms.  Denies chest pain or difficulty breathing.  Denies any other associated symptomatology.    Back Pain Pertinent negatives include no abdominal pain, chest pain, dysuria, fever or headaches.    Prior to Admission medications   Medication Sig Start Date End Date Taking? Authorizing Provider  Accu-Chek FastClix Lancets MISC USE 1  TO CHECK GLUCOSE TWICE DAILY 11/29/19  Yes [name], MD  albuterol (PROVENTIL HFA;VENTOLIN HFA) 108 (90 Base) MCG/ACT inhaler Inhale 2 puffs into the lungs every 4 (four) hours as needed for wheezing or shortness of breath (cough, shortness of breath or wheezing.). 03/11/18  Yes Robyn Haber, MD  blood glucose meter kit and supplies KIT Use to test blood sugar twice daily. DX: E11.8 07/30/19  Yes [name], MD  budesonide-formoterol [facility]) 80-4.5 MCG/ACT inhaler Inhale 2 puffs into the lungs 2 (two) times daily as needed (shortness of breath, wheezing). [facility] [employer] [facility] patient assistance 05/20/21  Yes [name], MD  glucose blood (ACCU-CHEK GUIDE) test strip Use as instructed twice daily. 11/29/19  Yes [name], MD  levocetirizine (XYZAL) 5 MG tablet TAKE 1 TABLET BY MOUTH EVERY EVENING 02/09/21  Yes [name], MD  potassium chloride (KLOR-CON 10) 10 MEQ tablet Take 1 tablet (10 mEq total) by mouth 2 (two) times  daily. 07/27/21  Yes [name], MD  rosuvastatin (CRESTOR) 20 MG tablet Take 1 tablet by mouth once daily 02/16/21  Yes [name], MD  tiZANidine (ZANAFLEX) 4 MG tablet Take 4 mg by mouth as needed for muscle spasms.   Yes [provider]  triamcinolone cream (KENALOG) 0.5 % Apply 1 application topically 3 (three) times daily. 11/11/20  Yes [name], MD  triamterene-hydrochlorothiazide (DYAZIDE) 37.5-25 MG capsule Take 1 each (1 capsule total) by mouth daily. 05/04/21  Yes [name], MD    Allergies  Allergen Reactions   Verapamil Swelling    Patient Active Problem List   Diagnosis Date Noted   Tinea pedis of both feet 04/16/2021   Onychomycosis of toenail 04/16/2021   Prolonged Q-T interval on ECG 10/18/2019   Dermal hypersensitivity reaction 07/11/2019   Type 2 diabetes mellitus with obesity ([place]) 07/11/2019   Intrinsic eczema 07/04/2019   CKD (chronic kidney disease) stage 3, GFR 30-59 ml/min (HCC) 10/11/2018   Class 3 severe obesity due to excess calories with serious comorbidity and body mass index (BMI) of 40.0 to 44.9 in adult [facility]) 09/14/2018   Chronic right-sided low back pain with right-sided sciatica 09/14/2018   Hyperlipidemia with target LDL less than 130 07/05/2018   Mild intermittent asthma without complication 93/81/8299   Snoring 12/14/2016   Essential hypertension 12/14/2016   Gastroesophageal reflux disease with esophagitis 08/26/2016   Dysphagia 08/26/2016   Spondylolisthesis of lumbar region  05/28/2016   Trochanteric bursitis of left hip 02/09/2016   Trochanteric bursitis, right hip 01/12/2016   Fibromyalgia 08/08/2015   Lumbar facet arthropathy 08/08/2015   Primary osteoarthritis of both knees 08/25/2014   Routine general medical examination at a health care facility 06/01/2013   Encounter for screening mammogram for breast cancer 06/01/2013   Abnormal electrocardiogram 02/05/2010   ALLERGIC RHINITIS 01/26/2010    Past  Medical History:  Diagnosis Date   Allergy    rhinitis   Anemia    Arthritis    Asthma    Diabetes mellitus without complication (HCC)    ZLDJTTSV(779.3)    History of stomach ulcers    Hypertension    Internal hemorrhoids     Past Surgical History:  Procedure Laterality Date   CARPAL TUNNEL RELEASE Right    TUBAL LIGATION      Social History   Socioeconomic History   Marital status: Married    Spouse name: Not on file   Number of children: 4   Years of education: 12   Highest education level: Not on file  Occupational History   Occupation: disabilty  Tobacco Use   Smoking status: Passive Smoke Exposure - Never Smoker   Smokeless tobacco: Never   Tobacco comments:    husband smokes outside and inside the home  Vaping Use   Vaping Use: Never used  Substance and Sexual Activity   Alcohol use: No    Alcohol/week: 0.0 standard drinks   Drug use: No   Sexual activity: Yes    Birth control/protection: Post-menopausal, Surgical  Other Topics Concern   Not on file  Social History Narrative   Drinks caffeine once a week    Social Determinants of Health   Financial Resource Strain: Not on file  Food Insecurity: Not on file  Transportation Needs: Not on file  Physical Activity: Not on file  Stress: Not on file  Social Connections: Not on file  Intimate Partner Violence: Not on file    Family History  Problem Relation Age of Onset   Arthritis Other    Diabetes Other    Hypertension Other    Breast cancer Other    Hypertension Mother    Diabetes Sister    Hypertension Sister    Breast cancer Daughter    Cancer Neg Hx    Early death Neg Hx    Hearing loss Neg Hx    Heart disease Neg Hx    Hyperlipidemia Neg Hx    Kidney disease Neg Hx    Stroke Neg Hx    Colon cancer Neg Hx      Review of Systems  Constitutional: Negative.  Negative for chills and fever.  HENT: Negative.  Negative for congestion and sore throat.   Respiratory: Negative.  Negative  for cough and shortness of breath.   Cardiovascular: Negative.  Negative for chest pain and palpitations.  Gastrointestinal:  Positive for nausea and vomiting. Negative for abdominal pain and diarrhea.  Genitourinary: Negative.  Negative for dysuria and hematuria.  Musculoskeletal:  Positive for back pain.  Skin: Negative.  Negative for rash.  Neurological: Negative.  Negative for dizziness and headaches.  All other systems reviewed and are negative.   Physical Exam Vitals reviewed.  Constitutional:      Appearance: Normal appearance. She is obese.  HENT:     Head: Normocephalic.  Eyes:     Extraocular Movements: Extraocular movements intact.     Pupils: Pupils are equal, round, and reactive  to light.  Cardiovascular:     Rate and Rhythm: Normal rate and regular rhythm.     Pulses: Normal pulses.     Heart sounds: Normal heart sounds.  Pulmonary:     Effort: Pulmonary effort is normal.     Breath sounds: Normal breath sounds.  Abdominal:     General: Bowel sounds are normal. There is no distension.     Palpations: Abdomen is soft.     Tenderness: There is no abdominal tenderness.  Musculoskeletal:     Cervical back: Normal range of motion and neck supple.     Thoracic back: Normal.     Lumbar back: Tenderness present. No bony tenderness. Decreased range of motion.  Skin:    General: Skin is warm and dry.     Capillary Refill: Capillary refill takes less than 2 seconds.  Neurological:     General: No focal deficit present.     Mental Status: She is alert and oriented to person, place, and time.     Sensory: No sensory deficit.     Motor: No weakness.     Coordination: Coordination normal.     Gait: Gait normal.     Deep Tendon Reflexes: Reflexes normal.  Psychiatric:        Mood and Affect: Mood normal.        Behavior: Behavior normal.     ASSESSMENT & PLAN: [name] was seen today for back pain.  Diagnoses and all orders for this visit:  Musculoskeletal back  pain -     ketorolac (TORADOL) injection 60 mg  Chronic pain syndrome  Flu vaccine need -     Flu Vaccine QUAD 72moIM (Fluarix, Fluzone & Alfiuria Quad PF)  Clinically stable.  Continue tramadol and muscle relaxants. Follow-up with chronic pain management. Follow-up with PCP as needed  Patient Instructions  Chronic Back Pain When back pain lasts longer than 3 months, it is called chronic back pain. Pain may get worse at certain times (flare-ups). There are things you can do at home to manage your pain. Follow these instructions at home: Pay attention to any changes in your symptoms. Take these actions to help with your pain: Managing pain and stiffness   If told, put ice on the painful area. Your doctor may tell you to use ice for 24-48 hours after the flare-up starts. To do this: Put ice in a plastic bag. Place a towel between your skin and the bag. Leave the ice on for 20 minutes, 2-3 times a day. If told, put heat on the painful area. Do this as often as told by your doctor. Use the heat source that your doctor recommends, such as a moist heat pack or a heating pad. Place a towel between your skin and the heat source. Leave the heat on for 20-30 minutes. Take off the heat if your skin turns bright red. This is especially important if you are unable to feel pain, heat, or cold. You may have a greater risk of getting burned. Soak in a warm bath. This can help relieve pain. Activity  Avoid bending and other activities that make pain worse. When standing: Keep your upper back and neck straight. Keep your shoulders pulled back. Avoid slouching. When sitting: Keep your back straight. Relax your shoulders. Do not round your shoulders or pull them backward. Do not sit or stand in one place for long periods of time. Take short rest breaks during the day. Lying down or standing is usually  better than sitting. Resting can help relieve pain. When sitting or lying down for a long time,  do some mild activity or stretching. This will help to prevent stiffness and pain. Get regular exercise. Ask your doctor what activities are safe for you. Do not lift anything that is heavier than 10 lb (4.5 kg) or the limit that you are told, until your doctor says that it is safe. To prevent injury when you lift things: Bend your knees. Keep the weight close to your body. Avoid twisting. Sleep on a firm mattress. Try lying on your side with your knees slightly bent. If you lie on your back, put a pillow under your knees. Medicines Treatment may include medicines for pain and swelling taken by mouth or put on the skin, prescription pain medicine, or muscle relaxants. Take over-the-counter and prescription medicines only as told by your doctor. Ask your doctor if the medicine prescribed to you: Requires you to avoid driving or using machinery. Can cause trouble pooping (constipation). You may need to take these actions to prevent or treat trouble pooping: Drink enough fluid to keep your pee (urine) pale yellow. Take over-the-counter or prescription medicines. Eat foods that are high in fiber. These include beans, whole grains, and fresh fruits and vegetables. Limit foods that are high in fat and sugars. These include fried or sweet foods. General instructions Do not use any products that contain nicotine or tobacco, such as cigarettes, e-cigarettes, and chewing tobacco. If you need help quitting, ask your doctor. Keep all follow-up visits as told by your doctor. This is important. Contact a doctor if: Your pain does not get better with rest or medicine. Your pain gets worse, or you have new pain. You have a high fever. You lose weight very quickly. You have trouble doing your normal activities. Get help right away if: One or both of your legs or feet feel weak. One or both of your legs or feet lose feeling (have numbness). You have trouble controlling when you poop (have a bowel  movement) or pee (urinate). You have bad back pain and: You feel like you may vomit (nauseous), or you vomit. You have pain in your belly (abdomen). You have shortness of breath. You faint. Summary When back pain lasts longer than 3 months, it is called chronic back pain. Pain may get worse at certain times (flare-ups). Use ice and heat as told by your doctor. Your doctor may tell you to use ice after flare-ups. This information is not intended to replace advice given to you by your health care provider. Make sure you discuss any questions you have with your health care provider. Document Revised: 12/05/2019 Document Reviewed: 12/05/2019 Elsevier Patient Education  2022 [place], MD [facility]

## 2021-07-30 NOTE — Patient Instructions (Signed)
Chronic Back Pain When back pain lasts longer than 3 months, it is called chronic back pain. Pain may get worse at certain times (flare-ups). There are things you can do at home to manage your pain. Follow these instructions at home: Pay attention to any changes in your symptoms. Take these actions to help with your pain: Managing pain and stiffness   If told, put ice on the painful area. Your doctor may tell you to use ice for 24-48 hours after the flare-up starts. To do this: Put ice in a plastic bag. Place a towel between your skin and the bag. Leave the ice on for 20 minutes, 2-3 times a day. If told, put heat on the painful area. Do this as often as told by your doctor. Use the heat source that your doctor recommends, such as a moist heat pack or a heating pad. Place a towel between your skin and the heat source. Leave the heat on for 20-30 minutes. Take off the heat if your skin turns bright red. This is especially important if you are unable to feel pain, heat, or cold. You may have a greater risk of getting burned. Soak in a warm bath. This can help relieve pain. Activity  Avoid bending and other activities that make pain worse. When standing: Keep your upper back and neck straight. Keep your shoulders pulled back. Avoid slouching. When sitting: Keep your back straight. Relax your shoulders. Do not round your shoulders or pull them backward. Do not sit or stand in one place for long periods of time. Take short rest breaks during the day. Lying down or standing is usually better than sitting. Resting can help relieve pain. When sitting or lying down for a long time, do some mild activity or stretching. This will help to prevent stiffness and pain. Get regular exercise. Ask your doctor what activities are safe for you. Do not lift anything that is heavier than 10 lb (4.5 kg) or the limit that you are told, until your doctor says that it is safe. To prevent injury when you lift  things: Bend your knees. Keep the weight close to your body. Avoid twisting. Sleep on a firm mattress. Try lying on your side with your knees slightly bent. If you lie on your back, put a pillow under your knees. Medicines Treatment may include medicines for pain and swelling taken by mouth or put on the skin, prescription pain medicine, or muscle relaxants. Take over-the-counter and prescription medicines only as told by your doctor. Ask your doctor if the medicine prescribed to you: Requires you to avoid driving or using machinery. Can cause trouble pooping (constipation). You may need to take these actions to prevent or treat trouble pooping: Drink enough fluid to keep your pee (urine) pale yellow. Take over-the-counter or prescription medicines. Eat foods that are high in fiber. These include beans, whole grains, and fresh fruits and vegetables. Limit foods that are high in fat and sugars. These include fried or sweet foods. General instructions Do not use any products that contain nicotine or tobacco, such as cigarettes, e-cigarettes, and chewing tobacco. If you need help quitting, ask your doctor. Keep all follow-up visits as told by your doctor. This is important. Contact a doctor if: Your pain does not get better with rest or medicine. Your pain gets worse, or you have new pain. You have a high fever. You lose weight very quickly. You have trouble doing your normal activities. Get help right away if: One   or both of your legs or feet feel weak. One or both of your legs or feet lose feeling (have numbness). You have trouble controlling when you poop (have a bowel movement) or pee (urinate). You have bad back pain and: You feel like you may vomit (nauseous), or you vomit. You have pain in your belly (abdomen). You have shortness of breath. You faint. Summary When back pain lasts longer than 3 months, it is called chronic back pain. Pain may get worse at certain times  (flare-ups). Use ice and heat as told by your doctor. Your doctor may tell you to use ice after flare-ups. This information is not intended to replace advice given to you by your health care provider. Make sure you discuss any questions you have with your health care provider. Document Revised: 12/05/2019 Document Reviewed: 12/05/2019 Elsevier Patient Education  2022 [employer] Inc[employer]  

## 2021-08-07 DIAGNOSIS — E669 Obesity, unspecified: Secondary | ICD-10-CM

## 2021-08-07 DIAGNOSIS — E1169 Type 2 diabetes mellitus with other specified complication: Secondary | ICD-10-CM | POA: Diagnosis not present

## 2021-08-07 DIAGNOSIS — E785 Hyperlipidemia, unspecified: Secondary | ICD-10-CM

## 2021-08-07 DIAGNOSIS — N183 Chronic kidney disease, stage 3 unspecified: Secondary | ICD-10-CM | POA: Diagnosis not present

## 2021-08-07 DIAGNOSIS — J4520 Mild intermittent asthma, uncomplicated: Secondary | ICD-10-CM

## 2021-08-07 DIAGNOSIS — I10 Essential (primary) hypertension: Secondary | ICD-10-CM

## 2021-08-24 ENCOUNTER — Telehealth: Payer: Self-pay | Admitting: Internal Medicine

## 2021-08-24 NOTE — Telephone Encounter (Signed)
1.Medication Requested: tiZANidine (ZANAFLEX) 4 MG tablet   2. Pharmacy (Name, Street, [place]): [place] ([place]), [place] - [address]  Phone:  [phone] Fax:  [phone]   3. On Med List: yes  4. Last Visit with PCP: 09.22.22  5. Next visit date with PCP: n/a  **Patient says she has been having tingling in her feet for about 3 weeks & it is causing her not to sleep at night**   Agent: Please be advised that RX refills may take up to 3 business days. We ask that you follow-up with your pharmacy.

## 2021-08-25 MED ORDER — TIZANIDINE HCL 4 MG PO TABS: 4.0000 mg | ORAL_TABLET | Freq: Every day | ORAL | 0 refills | Status: DC | PRN

## 2021-08-25 NOTE — Telephone Encounter (Signed)
If new complaint I would advise visit.

## 2021-08-25 NOTE — Telephone Encounter (Signed)
Attempted to contact pt. Call dropped. Will try again later.

## 2021-08-25 NOTE — Telephone Encounter (Signed)
I will prescribe a small supply of tizanidine since Dr. Ronnald Ramp knew she was on this medication.  She can discuss with him in the future.  He will continue to fill.  Prolonged QT noted on EKG from 10/2019, but did see cardiology who did not feel that that acute QT was accurate.  Repeat EKG since then has been normal.

## 2021-08-25 NOTE — Telephone Encounter (Signed)
Patient called in bc she missed nurse call  Patient scheduled OV for 10/21 but still would like for rx to be refilled until she is able to be seen (see prev message below)

## 2021-08-27 NOTE — Progress Notes (Signed)
Subjective:    Patient ID: [name], female    DOB: 01-30-1961, 60 y.o.   MRN: [id number]  This visit occurred during the SARS-CoV-2 public health emergency.  Safety protocols were in place, including screening questions prior to the visit, additional usage of staff PPE, and extensive cleaning of exam room while observing appropriate contact time as indicated for disinfecting solutions.    HPI The patient is here for an acute visit.   Tingling in dorsal aspect of left foot - started one month ago.  She only feels it at night when she lays down.  About 2 days after the tingling started she had blisters on the top of her foot.  They were not painful.  Blisters went away.  She denies tingling in the left leg.  The tingling is not going away.  She only feels it at night when she lays down and is not sleeping well.    She tried ice and heat.      Medications and allergies reviewed with patient and updated if appropriate.  Patient Active Problem List   Diagnosis Date Noted   Tinea pedis of both feet 04/16/2021   Onychomycosis of toenail 04/16/2021   Prolonged Q-T interval on ECG 10/18/2019   Dermal hypersensitivity reaction 07/11/2019   Type 2 diabetes mellitus with obesity ([place]) 07/11/2019   Intrinsic eczema 07/04/2019   CKD (chronic kidney disease) stage 3, GFR 30-59 ml/min (HCC) 10/11/2018   Class 3 severe obesity due to excess calories with serious comorbidity and body mass index (BMI) of 40.0 to 44.9 in adult [facility]) 09/14/2018   Chronic right-sided low back pain with right-sided sciatica 09/14/2018   Hyperlipidemia with target LDL less than 130 07/05/2018   Mild intermittent asthma without complication 84/16/6063   Snoring 12/14/2016   Essential hypertension 12/14/2016   Gastroesophageal reflux disease with esophagitis 08/26/2016   Dysphagia 08/26/2016   Spondylolisthesis of lumbar region 05/28/2016   Trochanteric bursitis of left hip 02/09/2016   Trochanteric  bursitis, right hip 01/12/2016   Fibromyalgia 08/08/2015   Lumbar facet arthropathy 08/08/2015   Primary osteoarthritis of both knees 08/25/2014   Routine general medical examination at a health care facility 06/01/2013   Encounter for screening mammogram for breast cancer 06/01/2013   Abnormal electrocardiogram 02/05/2010   ALLERGIC RHINITIS 01/26/2010    Current Outpatient Medications on File Prior to Visit  Medication Sig Dispense Refill   Accu-Chek FastClix Lancets MISC USE 1  TO CHECK GLUCOSE TWICE DAILY 102 each 3   albuterol (PROVENTIL HFA;VENTOLIN HFA) 108 (90 Base) MCG/ACT inhaler Inhale 2 puffs into the lungs every 4 (four) hours as needed for wheezing or shortness of breath (cough, shortness of breath or wheezing.). 1 Inhaler 1   blood glucose meter kit and supplies KIT Use to test blood sugar twice daily. DX: E11.8 1 each 0   budesonide-formoterol (SYMBICORT) 80-4.5 MCG/ACT inhaler Inhale 2 puffs into the lungs 2 (two) times daily as needed (shortness of breath, wheezing). Via [place] [employer] [place] patient assistance 1 each 3   glucose blood (ACCU-CHEK GUIDE) test strip Use as instructed twice daily. 300 each 1   levocetirizine (XYZAL) 5 MG tablet TAKE 1 TABLET BY MOUTH EVERY EVENING 90 tablet 1   potassium chloride (KLOR-CON 10) 10 MEQ tablet Take 1 tablet (10 mEq total) by mouth 2 (two) times daily. 180 tablet 0   rosuvastatin (CRESTOR) 20 MG tablet Take 1 tablet by mouth once daily 90 tablet 1   tiZANidine (  ZANAFLEX) 4 MG tablet Take 1 tablet (4 mg total) by mouth daily as needed for muscle spasms. 30 tablet 0   triamcinolone cream (KENALOG) 0.5 % Apply 1 application topically 3 (three) times daily. 454 g 1   triamterene-hydrochlorothiazide (DYAZIDE) 37.5-25 MG capsule Take 1 each (1 capsule total) by mouth daily. 90 capsule 1   No current facility-administered medications on file prior to visit.    Past Medical History:  Diagnosis Date   Allergy    rhinitis   Anemia    Arthritis     Asthma    Diabetes mellitus without complication (HCC)    ZOXWRUEA(540.9)    History of stomach ulcers    Hypertension    Internal hemorrhoids     Past Surgical History:  Procedure Laterality Date   CARPAL TUNNEL RELEASE Right    TUBAL LIGATION      Social History   Socioeconomic History   Marital status: Married    Spouse name: Not on file   Number of children: 4   Years of education: 12   Highest education level: Not on file  Occupational History   Occupation: disabilty  Tobacco Use   Smoking status: Passive Smoke Exposure - Never Smoker   Smokeless tobacco: Never   Tobacco comments:    husband smokes outside and inside the home  Vaping Use   Vaping Use: Never used  Substance and Sexual Activity   Alcohol use: No    Alcohol/week: 0.0 standard drinks   Drug use: No   Sexual activity: Yes    Birth control/protection: Post-menopausal, Surgical  Other Topics Concern   Not on file  Social History Narrative   Drinks caffeine once a week    Social Determinants of Health   Financial Resource Strain: Not on file  Food Insecurity: Not on file  Transportation Needs: Not on file  Physical Activity: Not on file  Stress: Not on file  Social Connections: Not on file    Family History  Problem Relation Age of Onset   Arthritis Other    Diabetes Other    Hypertension Other    Breast cancer Other    Hypertension Mother    Diabetes Sister    Hypertension Sister    Breast cancer Daughter    Cancer Neg Hx    Early death Neg Hx    Hearing loss Neg Hx    Heart disease Neg Hx    Hyperlipidemia Neg Hx    Kidney disease Neg Hx    Stroke Neg Hx    Colon cancer Neg Hx     Review of Systems  Constitutional:  Negative for chills and fever.  Cardiovascular:  Negative for leg swelling.  Musculoskeletal:  Positive for arthralgias and back pain. Negative for myalgias.  Neurological:  Positive for weakness (b/l legs - not focal) and numbness (tingling left dorsal foot).       Objective:   Vitals:   08/28/21 1254  BP: 138/86  Pulse: 70  Temp: 98.4 F (36.9 C)  SpO2: 99%   BP Readings from Last 3 Encounters:  08/28/21 138/86  07/30/21 138/88  04/16/21 132/88   Wt Readings from Last 3 Encounters:  08/28/21 230 lb (104.3 kg)  07/30/21 225 lb (102.1 kg)  04/16/21 230 lb 6.4 oz (104.5 kg)   Body mass index is 43.46 kg/m.   Physical Exam Constitutional:      General: She is not in acute distress.    Appearance: Normal appearance. She is  not ill-appearing.  HENT:     Head: Normocephalic and atraumatic.  Musculoskeletal:     Right lower leg: No edema.     Left lower leg: No edema.     Comments: No left dorsal foot pain with palpation, there is left lower back tenderness with palpation  Skin:    General: Skin is warm and dry.     Findings: No bruising, erythema, lesion or rash.  Neurological:     Mental Status: She is alert.     Sensory: No sensory deficit.     Motor: No weakness (no focal weakness - b/l LE strength intact).            Assessment & Plan:    Neuralgia -  acute  Left dorsal foot She did have blisters at 1 point on the dorsal aspect of her foot-? Shingles given blisters She does have some chronic left lower back pain and only has the pain when she lays down at night so this could be related to her chronic back issues ?  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

## 2021-08-28 ENCOUNTER — Other Ambulatory Visit: Payer: Self-pay

## 2021-08-28 ENCOUNTER — Encounter: Payer: Self-pay | Admitting: Internal Medicine

## 2021-08-28 ENCOUNTER — Ambulatory Visit (INDEPENDENT_AMBULATORY_CARE_PROVIDER_SITE_OTHER): Payer: Medicare Other | Admitting: Internal Medicine

## 2021-08-28 VITALS — BP 138/86 | HR 70 | Temp 98.4°F | Ht 61.0 in | Wt 230.0 lb

## 2021-08-28 DIAGNOSIS — M792 Neuralgia and neuritis, unspecified: Secondary | ICD-10-CM | POA: Diagnosis not present

## 2021-08-28 MED ORDER — VITAMIN D3 25 MCG (1000 UT) PO CAPS: 1000.0000 [IU] | ORAL_CAPSULE | Freq: Every day | ORAL | 3 refills | Status: AC

## 2021-08-28 MED ORDER — GABAPENTIN 300 MG PO CAPS: 300.0000 mg | ORAL_CAPSULE | Freq: Every day | ORAL | 3 refills | Status: DC

## 2021-08-28 MED ORDER — VITAMIN B-12 1000 MCG PO TABS: 1000.0000 ug | ORAL_TABLET | Freq: Every day | ORAL | 3 refills | Status: AC

## 2021-08-28 NOTE — Patient Instructions (Addendum)
    Medications changes include :   start gabapentin 300 mg at bedtime.   Start vitamin B12 1000 mcg daily and vitamin d 1000 units daily.      Your prescription(s) have been submitted to your pharmacy. Please take as directed and contact our office if you believe you are having problem(s) with the medication(s).   Please call if there is no improvement in your symptoms.

## 2021-09-16 ENCOUNTER — Other Ambulatory Visit: Payer: Self-pay | Admitting: Internal Medicine

## 2021-09-16 ENCOUNTER — Telehealth: Payer: Self-pay | Admitting: Internal Medicine

## 2021-09-16 DIAGNOSIS — E785 Hyperlipidemia, unspecified: Secondary | ICD-10-CM

## 2021-09-16 NOTE — Telephone Encounter (Signed)
Patient states she has diarrhea due to the medication prescribed  Patient does not know which medication is causing the diarrhea  Patient is requesting a call back

## 2021-09-17 ENCOUNTER — Other Ambulatory Visit: Payer: Self-pay | Admitting: Internal Medicine

## 2021-09-17 NOTE — Telephone Encounter (Signed)
Patient calling to check status of call back  Patient states the latest medication prescribed is gabapentin 300mg    Patient states Dr. Quay Burow' prescribed medication

## 2021-09-21 ENCOUNTER — Other Ambulatory Visit: Payer: Self-pay | Admitting: Internal Medicine

## 2021-09-21 ENCOUNTER — Telehealth: Payer: Medicare Other

## 2021-09-21 DIAGNOSIS — E1140 Type 2 diabetes mellitus with diabetic neuropathy, unspecified: Secondary | ICD-10-CM | POA: Insufficient documentation

## 2021-09-21 MED ORDER — PREGABALIN 50 MG PO CAPS: 50.0000 mg | ORAL_CAPSULE | Freq: Every day | ORAL | 0 refills | Status: DC

## 2021-09-21 NOTE — Telephone Encounter (Signed)
Patient calling in  Says no one has called her back yet & she is still experiencing the symptoms from last week  Would like someone to call her back today [phone]

## 2021-10-09 ENCOUNTER — Telehealth: Payer: Medicare Other

## 2021-10-09 NOTE — Progress Notes (Deleted)
Chronic Care Management Pharmacy Note  10/09/2021 Name:  [name] MRN:  [id number] DOB:  07/31/1961  Summary: -Pt has NOT started Trulicity due to recent GI issues - nausea/vomiting after eating, back pain after eating.   Recommendations/Changes made from today's visit: -Hold starting Trulicity until GI issues are addressed -Advised PCP visit to address GI issues   Subjective: [name] is an 60 y.o. year old female who is a primary patient of [name], MD.  The CCM team was consulted for assistance with disease management and care coordination needs.    Engaged with patient by telephone for follow up visit in response to provider referral for pharmacy case management and/or care coordination services.   Consent to Services:  The patient was given information about Chronic Care Management services, agreed to services, and gave verbal consent prior to initiation of services.  Please see initial visit note for detailed documentation.   Patient Care Team: [name], MD as PCP - General Buford Dresser, MD as PCP - Cardiology (Cardiology) Kennith Gain, MD as Consulting Physician (Allergy) Magnus Sinning, MD as Consulting Physician (Physical Medicine and Rehabilitation) Charlton Haws, [facility] as Pharmacist (Pharmacist)   Patient is currently living in a hotel while she searches for alternate housing. She is no longer living on [facility].  Recent office visits: 08/28/2021 - Dr. Quay Burow - neuralgia - started gabapentin, vit b12 and vit D 07/30/2021 - Dr. Mitchel Honour - evaluation of stomach and back pain - no changes to medications   Recent consult visits: 09/24/20 Dr Vira Blanco (pain med): f/u spondylosis  Hospital visits: None in previous 6 months  Objective:  Lab Results  Component Value Date   CREATININE 1.53 (H) 04/16/2021   BUN 24 (H) 04/16/2021   GFR 36.94 (L) 04/16/2021   GFRNONAA 40 (L) 10/17/2019   GFRAA 46 (L) 10/17/2019    NA 142 04/16/2021   K 3.4 (L) 04/16/2021   CALCIUM 9.9 04/16/2021   CO2 30 04/16/2021   GLUCOSE 86 04/16/2021    Lab Results  Component Value Date/Time   HGBA1C 6.6 (H) 04/16/2021 02:49 PM   HGBA1C 6.7 (H) 09/17/2020 03:52 PM   GFR 36.94 (L) 04/16/2021 02:49 PM   GFR 29.34 (L) 09/17/2020 03:52 PM   MICROALBUR 1.0 09/17/2020 03:52 PM   MICROALBUR <0.7 10/18/2019 04:18 PM    Last diabetic Eye exam:  Lab Results  Component Value Date/Time   HMDIABEYEEXA No Retinopathy 08/20/2020 12:00 AM    Last diabetic Foot exam: No results found for: HMDIABFOOTEX   Lab Results  Component Value Date   CHOL 122 09/17/2020   HDL 48.90 09/17/2020   LDLCALC 57 09/17/2020   TRIG 78.0 09/17/2020   CHOLHDL 2 09/17/2020    Hepatic Function Latest Ref Rng & Units 04/16/2021 09/17/2020 08/09/2019  Total Protein 6.0 - 8.3 g/dL 8.2 7.5 8.6(H)  Albumin 3.5 - 5.2 g/dL 4.1 4.0 3.9  AST 0 - 37 U/L _0 ALT 0 - 35 U/L _1 Alk Phosphatase 39 - 117 U/L 120(H) 127(H) 130(H)  Total Bilirubin 0.2 - 1.2 mg/dL 0.3 0.3 0.6  Bilirubin, Direct 0.0 - 0.3 mg/dL 0.1 0.0 -    Lab Results  Component Value Date/Time   TSH 0.94 09/17/2020 03:52 PM   TSH 2.80 07/11/2019 09:04 AM    CBC Latest Ref Rng & Units 09/17/2020 10/17/2019 08/09/2019  WBC 4.0 - 10.5 K/uL 6.1 5.4 9.1  Hemoglobin 12.0 -  15.0 g/dL 13.7 13.4 13.8  Hematocrit 36.0 - 46.0 % 42.3 42.8 42.6  Platelets 150.0 - 400.0 K/uL 197.0 260 261    No results found for: VD25OH  Clinical ASCVD: No  The ASCVD Risk score (Arnett DK, et al., 2019) failed to calculate for the following reasons:   The valid total cholesterol range is 130 to 320 mg/dL    Depression screen [facility] 2/9 07/30/2021 04/16/2021 09/07/2019  Decreased Interest 0 0 0  Down, Depressed, Hopeless 0 0 1  PHQ - 2 Score 0 0 1  Altered sleeping - - -  Tired, decreased energy - - -  Change in appetite - - -  Feeling bad or failure about yourself  - - -  Trouble concentrating - - -   Moving slowly or fidgety/restless - - -  Suicidal thoughts - - -  PHQ-9 Score - - -  Difficult doing work/chores - - -  Some recent data might be hidden     Social History   Tobacco Use  Smoking Status Passive Smoke Exposure - Never Smoker  Smokeless Tobacco Never  Tobacco Comments   husband smokes outside and inside the home   BP Readings from Last 3 Encounters:  08/28/21 138/86  07/30/21 138/88  04/16/21 132/88   Pulse Readings from Last 3 Encounters:  08/28/21 70  07/30/21 79  04/16/21 73   Wt Readings from Last 3 Encounters:  08/28/21 230 lb (104.3 kg)  07/30/21 225 lb (102.1 kg)  04/16/21 230 lb 6.4 oz (104.5 kg)   BMI Readings from Last 3 Encounters:  08/28/21 43.46 kg/m  07/30/21 42.51 kg/m  04/16/21 43.53 kg/m    Assessment/Interventions: Review of patient past medical history, allergies, medications, health status, including review of consultants reports, laboratory and other test data, was performed as part of comprehensive evaluation and provision of chronic care management services.   SDOH:  (Social Determinants of Health) assessments and interventions performed: Yes  SDOH Screenings   Alcohol Screen: Not on file  Depression (PHQ2-9): Low Risk    PHQ-2 Score: 0  Financial Resource Strain: Not on file  Food Insecurity: Not on file  Housing: Not on file  Physical Activity: Not on file  Social Connections: Not on file  Stress: Not on file  Tobacco Use: Medium Risk   Smoking Tobacco Use: Passive Smoke Exposure - Never Smoker   Smokeless Tobacco Use: Never   Passive Exposure: Yes  Transportation Needs: Not on file    [place]  Allergies  Allergen Reactions   Verapamil Swelling    Medications Reviewed Today     Reviewed by Binnie Rail, MD (Physician) on 08/28/21 at 1305  Med List Status: <None>   Medication Order Taking? Sig Documenting Provider Last Dose Status Informant  Accu-Chek FastClix Lancets MISC [id number] Yes USE 1  TO  CHECK GLUCOSE TWICE DAILY [name], MD Taking Active   albuterol (PROVENTIL HFA;VENTOLIN HFA) 108 (90 Base) MCG/ACT inhaler [id number] Yes Inhale 2 puffs into the lungs every 4 (four) hours as needed for wheezing or shortness of breath (cough, shortness of breath or wheezing.). Robyn Haber, MD Taking Active Self  blood glucose meter kit and supplies KIT [id number] Yes Use to test blood sugar twice daily. DX: E11.8 [name], MD Taking Active Self  budesonide-formoterol [facility]) 80-4.5 MCG/ACT inhaler [id number] Yes Inhale 2 puffs into the lungs 2 (two) times daily as needed (shortness of breath, wheezing). Via [place] patient assistance [name]  L, MD Taking Active   glucose blood (ACCU-CHEK GUIDE) test strip [id number] Yes Use as instructed twice daily. [name], MD Taking Active   levocetirizine (XYZAL) 5 MG tablet [id number] Yes TAKE 1 TABLET BY MOUTH EVERY EVENING [name], MD Taking Active   potassium chloride (KLOR-CON 10) 10 MEQ tablet [id number] Yes Take 1 tablet (10 mEq total) by mouth 2 (two) times daily. [name], MD Taking Active   rosuvastatin (CRESTOR) 20 MG tablet [id number] Yes Take 1 tablet by mouth once daily [name], MD Taking Active   tiZANidine (ZANAFLEX) 4 MG tablet [id number] Yes Take 1 tablet (4 mg total) by mouth daily as needed for muscle spasms. Binnie Rail, MD Taking Active   triamcinolone cream (KENALOG) 0.5 % [id number] Yes Apply 1 application topically 3 (three) times daily. [name], MD Taking Active   triamterene-hydrochlorothiazide (DYAZIDE) 37.5-25 MG capsule [id number] Yes Take 1 each (1 capsule total) by mouth daily. [name], MD Taking Active             Patient Active Problem List   Diagnosis Date Noted   Chronic painful diabetic neuropathy ([place]) 09/21/2021   Tinea pedis of both feet 04/16/2021   Onychomycosis of toenail 04/16/2021   Prolonged Q-T interval on ECG 10/18/2019   Type 2 diabetes  mellitus with obesity ([place]) 07/11/2019   Intrinsic eczema 07/04/2019   CKD (chronic kidney disease) stage 3, GFR 30-59 ml/min (HCC) 10/11/2018   Class 3 severe obesity due to excess calories with serious comorbidity and body mass index (BMI) of 40.0 to 44.9 in adult ([place]) 09/14/2018   Chronic right-sided low back pain with right-sided sciatica 09/14/2018   Hyperlipidemia with target LDL less than 130 07/05/2018   Mild intermittent asthma without complication 26/71/2458   Snoring 12/14/2016   Essential hypertension 12/14/2016   Gastroesophageal reflux disease with esophagitis 08/26/2016   Dysphagia 08/26/2016   Spondylolisthesis of lumbar region 05/28/2016   Fibromyalgia 08/08/2015   Lumbar facet arthropathy 08/08/2015   Primary osteoarthritis of both knees 08/25/2014   Routine general medical examination at a health care facility 06/01/2013   Encounter for screening mammogram for breast cancer 06/01/2013   Abnormal electrocardiogram 02/05/2010   ALLERGIC RHINITIS 01/26/2010    Immunization History  Administered Date(s) Administered   Influenza Split 09/27/2012   Influenza,inj,Quad PF,6+ Mos 08/23/2014, 11/26/2015, 08/26/2016, 07/05/2018, 07/04/2019, 09/17/2020, 07/30/2021   PFIZER(Purple Top)SARS-COV-2 Vaccination 01/07/2020, 02/13/2020   PNEUMOCOCCAL CONJUGATE-20 04/16/2021   PPD Test 08/23/2014   Pneumococcal Polysaccharide-23 09/27/2012, 04/17/2020   Tdap 06/07/2010, 07/19/2019    Conditions to be addressed/monitored:  Hypertension, Hyperlipidemia, Diabetes, Asthma, and Chronic Kidney Disease, Chronic pain  There are no care plans that you recently modified to display for this patient.  Medication Assistance:  Symbicort - AZ&Me - approved through 09/98/33 Trulicity - [employer] - pt income needed.  Compliance/Adherence/Medication fill history: Care Gaps: Vaccines - Shingrix, covid booster, prevnar, flu  Star-Rating Drugs: Rosuvastatin - LF 02/16/21 x 90  ds  Patient's preferred pharmacy is:  [facility] ([place]), [place] - [address] [place] [address] Phone: [phone] Fax: [phone]  [facility] [place], [place] [address] Phone: [phone] Fax: [phone]  [facility] - [address] [place]. [place] [address]  Phone: [phone] Fax: [phone]  Uses pill box? No - prefers bottle Pt endorses 99% compliance  We discussed: Current pharmacy is preferred with insurance plan and patient is satisfied with pharmacy services Patient decided to: Continue current medication management strategy  Care Plan and Follow Up Patient Decision:  Patient agrees to Care Plan and Follow-up.  Plan: Telephone follow up appointment with care management team member scheduled for:  *** months  ***  Chart prep = 10

## 2021-10-15 ENCOUNTER — Other Ambulatory Visit: Payer: Self-pay | Admitting: Internal Medicine

## 2021-10-15 DIAGNOSIS — L2084 Intrinsic (allergic) eczema: Secondary | ICD-10-CM

## 2021-10-15 DIAGNOSIS — H6982 Other specified disorders of Eustachian tube, left ear: Secondary | ICD-10-CM

## 2021-10-17 ENCOUNTER — Other Ambulatory Visit: Payer: Self-pay | Admitting: Internal Medicine

## 2021-10-22 ENCOUNTER — Ambulatory Visit (INDEPENDENT_AMBULATORY_CARE_PROVIDER_SITE_OTHER): Payer: Medicare Other | Admitting: Internal Medicine

## 2021-10-22 ENCOUNTER — Encounter: Payer: Self-pay | Admitting: Internal Medicine

## 2021-10-22 ENCOUNTER — Other Ambulatory Visit: Payer: Self-pay

## 2021-10-22 VITALS — BP 126/86 | HR 75 | Temp 97.9°F | Ht 61.0 in | Wt 244.0 lb

## 2021-10-22 DIAGNOSIS — E669 Obesity, unspecified: Secondary | ICD-10-CM | POA: Diagnosis not present

## 2021-10-22 DIAGNOSIS — I10 Essential (primary) hypertension: Secondary | ICD-10-CM

## 2021-10-22 DIAGNOSIS — M5441 Lumbago with sciatica, right side: Secondary | ICD-10-CM

## 2021-10-22 DIAGNOSIS — E1140 Type 2 diabetes mellitus with diabetic neuropathy, unspecified: Secondary | ICD-10-CM

## 2021-10-22 DIAGNOSIS — G8929 Other chronic pain: Secondary | ICD-10-CM | POA: Diagnosis not present

## 2021-10-22 DIAGNOSIS — E785 Hyperlipidemia, unspecified: Secondary | ICD-10-CM

## 2021-10-22 DIAGNOSIS — N183 Chronic kidney disease, stage 3 unspecified: Secondary | ICD-10-CM | POA: Diagnosis not present

## 2021-10-22 DIAGNOSIS — Z0001 Encounter for general adult medical examination with abnormal findings: Secondary | ICD-10-CM | POA: Insufficient documentation

## 2021-10-22 DIAGNOSIS — Z23 Encounter for immunization: Secondary | ICD-10-CM

## 2021-10-22 DIAGNOSIS — M170 Bilateral primary osteoarthritis of knee: Secondary | ICD-10-CM | POA: Diagnosis not present

## 2021-10-22 DIAGNOSIS — E1169 Type 2 diabetes mellitus with other specified complication: Secondary | ICD-10-CM | POA: Diagnosis not present

## 2021-10-22 DIAGNOSIS — Z124 Encounter for screening for malignant neoplasm of cervix: Secondary | ICD-10-CM

## 2021-10-22 LAB — URINALYSIS, ROUTINE W REFLEX MICROSCOPIC
Bilirubin Urine: NEGATIVE
Hgb urine dipstick: NEGATIVE
Ketones, ur: NEGATIVE
Leukocytes,Ua: NEGATIVE
Nitrite: NEGATIVE
RBC / HPF: NONE SEEN (ref 0–?)
Specific Gravity, Urine: 1.015 (ref 1.000–1.030)
Total Protein, Urine: NEGATIVE
Urine Glucose: NEGATIVE
Urobilinogen, UA: 0.2 (ref 0.0–1.0)
pH: 6.5 (ref 5.0–8.0)

## 2021-10-22 LAB — CBC WITH DIFFERENTIAL/PLATELET
Basophils Absolute: 0 10*3/uL (ref 0.0–0.1)
Basophils Relative: 0.8 % (ref 0.0–3.0)
Eosinophils Absolute: 0.2 10*3/uL (ref 0.0–0.7)
Eosinophils Relative: 4.9 % (ref 0.0–5.0)
HCT: 37.3 % (ref 36.0–46.0)
Hemoglobin: 11.7 g/dL — ABNORMAL LOW (ref 12.0–15.0)
Lymphocytes Relative: 47.8 % — ABNORMAL HIGH (ref 12.0–46.0)
Lymphs Abs: 2 10*3/uL (ref 0.7–4.0)
MCHC: 31.2 g/dL (ref 30.0–36.0)
MCV: 82.2 fl (ref 78.0–100.0)
Monocytes Absolute: 0.5 10*3/uL (ref 0.1–1.0)
Monocytes Relative: 12.1 % — ABNORMAL HIGH (ref 3.0–12.0)
Neutro Abs: 1.4 10*3/uL (ref 1.4–7.7)
Neutrophils Relative %: 34.4 % — ABNORMAL LOW (ref 43.0–77.0)
Platelets: 221 10*3/uL (ref 150.0–400.0)
RBC: 4.54 Mil/uL (ref 3.87–5.11)
RDW: 16.2 % — ABNORMAL HIGH (ref 11.5–15.5)
WBC: 4.2 10*3/uL (ref 4.0–10.5)

## 2021-10-22 LAB — BASIC METABOLIC PANEL
BUN: 17 mg/dL (ref 6–23)
CO2: 32 mEq/L (ref 19–32)
Calcium: 9.7 mg/dL (ref 8.4–10.5)
Chloride: 104 mEq/L (ref 96–112)
Creatinine, Ser: 1.4 mg/dL — ABNORMAL HIGH (ref 0.40–1.20)
GFR: 40.94 mL/min — ABNORMAL LOW (ref >60.00)
Glucose, Bld: 88 mg/dL (ref 70–99)
Potassium: 4.3 mEq/L (ref 3.5–5.1)
Sodium: 142 mEq/L (ref 135–145)

## 2021-10-22 LAB — TSH: TSH: 1.49 u[IU]/mL (ref 0.35–5.50)

## 2021-10-22 LAB — MICROALBUMIN / CREATININE URINE RATIO
Creatinine,U: 71.8 mg/dL
Microalb Creat Ratio: 1 mg/g (ref 0.0–30.0)
Microalb, Ur: <0.7 mg/dL (ref 0.0–1.9)

## 2021-10-22 LAB — HEPATIC FUNCTION PANEL
ALT: 13 U/L (ref 0–35)
AST: 19 U/L (ref 0–37)
Albumin: 3.8 g/dL (ref 3.5–5.2)
Alkaline Phosphatase: 132 U/L — ABNORMAL HIGH (ref 39–117)
Bilirubin, Direct: 0 mg/dL (ref 0.0–0.3)
Total Bilirubin: 0.3 mg/dL (ref 0.2–1.2)
Total Protein: 7.2 g/dL (ref 6.0–8.3)

## 2021-10-22 LAB — LIPID PANEL
Cholesterol: 120 mg/dL (ref 0–200)
HDL: 45.4 mg/dL (ref >39.00)
LDL Cholesterol: 61 mg/dL (ref 0–99)
NonHDL: 74.55
Total CHOL/HDL Ratio: 3
Triglycerides: 70 mg/dL (ref 0.0–149.0)
VLDL: 14 mg/dL (ref 0.0–40.0)

## 2021-10-22 LAB — HEMOGLOBIN A1C: Hgb A1c MFr Bld: 6.4 % (ref 4.6–6.5)

## 2021-10-22 MED ORDER — SHINGRIX 50 MCG/0.5ML IM SUSR: 0.5000 mL | Freq: Once | INTRAMUSCULAR | 1 refills | Status: AC

## 2021-10-22 NOTE — Progress Notes (Signed)
Subjective:  Patient ID: [name], female    DOB: 08/03/1961  Age: 60 y.o. MRN: [id number]  CC: Annual Exam, Hypertension, Hyperlipidemia, Diabetes, Osteoarthritis, and Back Pain  This visit occurred during the SARS-CoV-2 public health emergency.  Safety protocols were in place, including screening questions prior to the visit, additional usage of staff PPE, and extensive cleaning of exam room while observing appropriate contact time as indicated for disinfecting solutions.    HPI FLEDA PAGEL presents for a CPX and f/up -   She continues to complain of a burning pain in both feet.  She tells me that Lyrica has helped some.  She has had to limit the Lyrica dose because it caused edema.  She has stopped taking tramadol because it was not helpful.  She also continues to complain of musculoskeletal pain.  She is active and denies chest pain, shortness of breath, diaphoresis, edema, dizziness, or lightheadedness.  Outpatient Medications Prior to Visit  Medication Sig Dispense Refill   Accu-Chek FastClix Lancets MISC USE 1  TO CHECK GLUCOSE TWICE DAILY 102 each 3   albuterol (PROVENTIL HFA;VENTOLIN HFA) 108 (90 Base) MCG/ACT inhaler Inhale 2 puffs into the lungs every 4 (four) hours as needed for wheezing or shortness of breath (cough, shortness of breath or wheezing.). 1 Inhaler 1   blood glucose meter kit and supplies KIT Use to test blood sugar twice daily. DX: E11.8 1 each 0   budesonide-formoterol (SYMBICORT) 80-4.5 MCG/ACT inhaler Inhale 2 puffs into the lungs 2 (two) times daily as needed (shortness of breath, wheezing). Via [place] [employer] [place] patient assistance 1 each 3   Cholecalciferol (VITAMIN D3) 25 MCG (1000 UT) CAPS Take 1 capsule (1,000 Units total) by mouth daily. 90 capsule 3   glucose blood (ACCU-CHEK GUIDE) test strip Use as instructed twice daily. 300 each 1   levocetirizine (XYZAL) 5 MG tablet TAKE 1 TABLET BY MOUTH ONCE DAILY IN THE EVENING 90 tablet 1   potassium chloride  (KLOR-CON 10) 10 MEQ tablet Take 1 tablet (10 mEq total) by mouth 2 (two) times daily. 180 tablet 0   pregabalin (LYRICA) 50 MG capsule Take 1 capsule (50 mg total) by mouth at bedtime. 90 capsule 0   rosuvastatin (CRESTOR) 20 MG tablet Take 1 tablet by mouth once daily 90 tablet 1   tiZANidine (ZANAFLEX) 4 MG tablet TAKE 1 TABLET BY MOUTH ONCE DAILY AS NEEDED FOR MUSCLE SPASM 30 tablet 0   triamcinolone cream (KENALOG) 0.5 % Apply 1 application topically 3 (three) times daily. 454 g 1   triamterene-hydrochlorothiazide (DYAZIDE) 37.5-25 MG capsule Take 1 each (1 capsule total) by mouth daily. 90 capsule 1   vitamin B-12 (CYANOCOBALAMIN) 1000 MCG tablet Take 1 tablet (1,000 mcg total) by mouth daily. 90 tablet 3   No facility-administered medications prior to visit.    ROS Review of Systems  Constitutional:  Negative for chills, diaphoresis, fatigue and fever.  HENT: Negative.    Eyes: Negative.   Respiratory:  Negative for cough, chest tightness, shortness of breath and wheezing.   Cardiovascular:  Negative for chest pain, palpitations and leg swelling.  Gastrointestinal:  Negative for abdominal pain, constipation, diarrhea, nausea and vomiting.  Endocrine: Negative.   Genitourinary: Negative.  Negative for difficulty urinating.  Musculoskeletal:  Positive for arthralgias and back pain. Negative for myalgias.  Skin: Negative.  Negative for color change.  Neurological:  Negative for dizziness, weakness, light-headedness and headaches.  Hematological:  Negative for adenopathy. Does not bruise/bleed  easily.  Psychiatric/Behavioral: Negative.     Objective:  BP 126/86 (BP Location: Right Arm, Patient Position: Sitting, Cuff Size: Large)    Pulse 75    Temp 97.9 F (36.6 C) (Oral)    Ht 5' 1"  (1.549 m)    Wt 244 lb (110.7 kg)    LMP 05/17/2012    SpO2 95%    BMI 46.10 kg/m   BP Readings from Last 3 Encounters:  10/22/21 126/86  08/28/21 138/86  07/30/21 138/88    Wt Readings from  Last 3 Encounters:  10/22/21 244 lb (110.7 kg)  08/28/21 230 lb (104.3 kg)  07/30/21 225 lb (102.1 kg)    Physical Exam Vitals reviewed.  HENT:     Nose: Nose normal.     Mouth/Throat:     Mouth: Mucous membranes are moist.  Eyes:     General: No scleral icterus.    Conjunctiva/sclera: Conjunctivae normal.  Cardiovascular:     Rate and Rhythm: Normal rate and regular rhythm.     Heart sounds: No murmur heard. Pulmonary:     Effort: Pulmonary effort is normal.     Breath sounds: No stridor. No wheezing, rhonchi or rales.  Abdominal:     General: Abdomen is protuberant. Bowel sounds are normal. There is no distension.     Palpations: Abdomen is soft. There is no hepatomegaly, splenomegaly or mass.     Tenderness: There is no abdominal tenderness. There is no guarding.  Musculoskeletal:        General: Deformity (DJD) present. No swelling or tenderness.     Cervical back: Neck supple.     Right lower leg: No edema.     Left lower leg: No edema.  Lymphadenopathy:     Cervical: No cervical adenopathy.  Skin:    General: Skin is warm and dry.     Coloration: Skin is not pale.  Neurological:     General: No focal deficit present.     Mental Status: She is alert.  Psychiatric:        Mood and Affect: Mood normal.        Behavior: Behavior normal.    Lab Results  Component Value Date   WBC 4.2 10/22/2021   HGB 11.7 (L) 10/22/2021   HCT 37.3 10/22/2021   PLT 221.0 10/22/2021   GLUCOSE 88 10/22/2021   CHOL 120 10/22/2021   TRIG 70.0 10/22/2021   HDL 45.40 10/22/2021   LDLCALC 61 10/22/2021   ALT 13 10/22/2021   AST 19 10/22/2021   NA 142 10/22/2021   K 4.3 10/22/2021   CL 104 10/22/2021   CREATININE 1.40 (H) 10/22/2021   BUN 17 10/22/2021   CO2 32 10/22/2021   TSH 1.49 10/22/2021   HGBA1C 6.4 10/22/2021   MICROALBUR <0.7 10/22/2021    MM 3D SCREEN BREAST BILATERAL  Result Date: 06/02/2021 CLINICAL DATA:  Screening. EXAM: DIGITAL SCREENING BILATERAL MAMMOGRAM  WITH TOMOSYNTHESIS AND CAD TECHNIQUE: Bilateral screening digital craniocaudal and mediolateral oblique mammograms were obtained. Bilateral screening digital breast tomosynthesis was performed. The images were evaluated with computer-aided detection. COMPARISON:  Previous exam(s). ACR Breast Density Category a: The breast tissue is almost entirely fatty. FINDINGS: There are no findings suspicious for malignancy. IMPRESSION: No mammographic evidence of malignancy. A result letter of this screening mammogram will be mailed directly to the patient. RECOMMENDATION: Screening mammogram in one year. (Code:SM-B-01Y) BI-RADS CATEGORY  1: Negative. Electronically Signed   By: Evangeline Dakin M.D.   On: 06/02/2021  13:01    Assessment & Plan:   Wilson was seen today for annual exam, hypertension, hyperlipidemia, diabetes, osteoarthritis and back pain.  Diagnoses and all orders for this visit:  Essential hypertension- Her blood pressure is adequately well controlled. -     Basic metabolic panel; Future -     CBC with Differential/Platelet; Future -     TSH; Future -     Urinalysis, Routine w reflex microscopic; Future -     Hepatic function panel; Future -     Hepatic function panel -     Urinalysis, Routine w reflex microscopic -     TSH -     CBC with Differential/Platelet -     Basic metabolic panel  Type 2 diabetes mellitus with obesity ([place])- Her blood sugar is adequately well controlled. -     Microalbumin / creatinine urine ratio; Future -     Hemoglobin A1c; Future -     Ambulatory referral to Ophthalmology -     Hemoglobin A1c -     Microalbumin / creatinine urine ratio -     Finerenone (KERENDIA) 10 MG TABS; Take 1 tablet by mouth daily.  Stage 3 chronic kidney disease, unspecified whether stage 3a or 3b CKD ([place])- I recommended that she start taking a mineralocorticoid antagonist for renal protection. -     Microalbumin / creatinine urine ratio; Future -     Urinalysis, Routine w  reflex microscopic; Future -     Urinalysis, Routine w reflex microscopic -     Microalbumin / creatinine urine ratio -     Finerenone (KERENDIA) 10 MG TABS; Take 1 tablet by mouth daily.  Hyperlipidemia with target LDL less than 130- LDL goal achieved. Doing well on the statin  -     TSH; Future -     Hepatic function panel; Future -     Hepatic function panel -     TSH  Encounter for general adult medical examination with abnormal findings- Exam completed, labs reviewed, vaccines reviewed and updated, cancer screenings are up-to-date, patient education was given. -     Lipid panel; Future -     Lipid panel  Chronic right-sided low back pain with right-sided sciatica -     Discontinue: HYDROcodone-acetaminophen (NORCO/VICODIN) 5-325 MG tablet; Take 1 tablet by mouth every 6 (six) hours as needed for severe pain. -     HYDROcodone-acetaminophen (NORCO/VICODIN) 5-325 MG tablet; Take 1 tablet by mouth every 6 (six) hours as needed for severe pain.  Primary osteoarthritis of both knees -     Discontinue: HYDROcodone-acetaminophen (NORCO/VICODIN) 5-325 MG tablet; Take 1 tablet by mouth every 6 (six) hours as needed for severe pain. -     HYDROcodone-acetaminophen (NORCO/VICODIN) 5-325 MG tablet; Take 1 tablet by mouth every 6 (six) hours as needed for severe pain.  Need for shingles vaccine -     Zoster Vaccine Adjuvanted [facility]) injection; Inject 0.5 mLs into the muscle once for 1 dose.  Chronic painful diabetic neuropathy (HCC) -     Discontinue: HYDROcodone-acetaminophen (NORCO/VICODIN) 5-325 MG tablet; Take 1 tablet by mouth every 6 (six) hours as needed for severe pain. -     L-Methylfolate-Algae-B12-B6 (METANX) 3-90.314-2-35 MG CAPS; Take 1 capsule by mouth 2 (two) times daily. -     HYDROcodone-acetaminophen (NORCO/VICODIN) 5-325 MG tablet; Take 1 tablet by mouth every 6 (six) hours as needed for severe pain.  I am having Zita P. Orama start on [facility], [place], and Metanx.  I am also having her maintain her albuterol, blood glucose meter kit and supplies, Accu-Chek FastClix Lancets, Accu-Chek Guide, triamcinolone cream, triamterene-hydrochlorothiazide, budesonide-formoterol, potassium chloride, vitamin B-12, Vitamin D3, rosuvastatin, pregabalin, levocetirizine, tiZANidine, and HYDROcodone-acetaminophen.  Meds ordered this encounter  Medications   Zoster Vaccine Adjuvanted [facility]) injection    Sig: Inject 0.5 mLs into the muscle once for 1 dose.    Dispense:  0.5 mL    Refill:  1   Finerenone (KERENDIA) 10 MG TABS    Sig: Take 1 tablet by mouth daily.    Dispense:  30 tablet    Refill:  0   DISCONTD: HYDROcodone-acetaminophen (NORCO/VICODIN) 5-325 MG tablet    Sig: Take 1 tablet by mouth every 6 (six) hours as needed for severe pain.    Dispense:  65 tablet    Refill:  0   L-Methylfolate-Algae-B12-B6 (METANX) 3-90.314-2-35 MG CAPS    Sig: Take 1 capsule by mouth 2 (two) times daily.    Dispense:  180 capsule    Refill:  3   HYDROcodone-acetaminophen (NORCO/VICODIN) 5-325 MG tablet    Sig: Take 1 tablet by mouth every 6 (six) hours as needed for severe pain.    Dispense:  65 tablet    Refill:  0      Follow-up: Return in about 6 months (around 04/22/2022).  [name], MD

## 2021-10-22 NOTE — Patient Instructions (Signed)

## 2021-10-23 ENCOUNTER — Encounter: Payer: Self-pay | Admitting: Internal Medicine

## 2021-10-23 ENCOUNTER — Telehealth: Payer: Self-pay | Admitting: Internal Medicine

## 2021-10-23 DIAGNOSIS — Z124 Encounter for screening for malignant neoplasm of cervix: Secondary | ICD-10-CM | POA: Insufficient documentation

## 2021-10-23 DIAGNOSIS — Z23 Encounter for immunization: Secondary | ICD-10-CM | POA: Insufficient documentation

## 2021-10-23 MED ORDER — HYDROCODONE-ACETAMINOPHEN 5-325 MG PO TABS
1.0000 | ORAL_TABLET | Freq: Four times a day (QID) | ORAL | 0 refills | Status: DC | PRN
Start: 2021-10-23 — End: 2021-10-23

## 2021-10-23 MED ORDER — METANX 3-90.314-2-35 MG PO CAPS: 1.0000 | ORAL_CAPSULE | Freq: Two times a day (BID) | ORAL | 3 refills | Status: DC

## 2021-10-23 MED ORDER — HYDROCODONE-ACETAMINOPHEN 5-325 MG PO TABS: 1.0000 | ORAL_TABLET | Freq: Four times a day (QID) | ORAL | 0 refills | Status: DC | PRN

## 2021-10-23 MED ORDER — KERENDIA 10 MG PO TABS: 1.0000 | ORAL_TABLET | Freq: Every day | ORAL | 0 refills | Status: DC

## 2021-10-23 NOTE — Telephone Encounter (Signed)
Patient states Zoster Vac Recomb Adjuvanted 50 MCG/0.5ML  medication is too expensive  Patient requesting an affordable alternative medication  Patient requesting pain medication, patient states she discussed pain medication w/ provider at ov on 12-16

## 2021-10-23 NOTE — Telephone Encounter (Signed)
Pt stated that shingles vaccine was $119 and too expensive so she did not receive it.  She stated that last night she took one of her husbands hydrocodone because the swelling and pain in her legs and feet was unbearable and it helped. She wanted to know if she could have an Rx of her own or what does PCP recommend. Please advise.

## 2021-10-27 NOTE — Telephone Encounter (Signed)
Patient calling back in to check status of request for pain medication due to swelling & pain in her feet  Please call patient [phone]

## 2021-10-27 NOTE — Telephone Encounter (Signed)
Pt informed Rx was sent.

## 2021-11-04 ENCOUNTER — Other Ambulatory Visit: Payer: Self-pay | Admitting: Internal Medicine

## 2021-11-04 DIAGNOSIS — I10 Essential (primary) hypertension: Secondary | ICD-10-CM

## 2021-11-04 DIAGNOSIS — E876 Hypokalemia: Secondary | ICD-10-CM

## 2021-11-10 ENCOUNTER — Telehealth: Payer: Self-pay

## 2021-11-10 ENCOUNTER — Other Ambulatory Visit: Payer: Self-pay | Admitting: Internal Medicine

## 2021-11-10 DIAGNOSIS — M170 Bilateral primary osteoarthritis of knee: Secondary | ICD-10-CM

## 2021-11-10 DIAGNOSIS — E1140 Type 2 diabetes mellitus with diabetic neuropathy, unspecified: Secondary | ICD-10-CM

## 2021-11-10 DIAGNOSIS — G8929 Other chronic pain: Secondary | ICD-10-CM

## 2021-11-10 MED ORDER — HYDROCODONE-ACETAMINOPHEN 5-325 MG PO TABS: 1.0000 | ORAL_TABLET | Freq: Four times a day (QID) | ORAL | 0 refills | Status: DC | PRN

## 2021-11-10 NOTE — Telephone Encounter (Signed)
Pt calling stating that the Pharmacy told her that her insurance would only cover 20 pills at a time. Pt is requesting a new Rx be sent for HYDROcodone-acetaminophen (NORCO/VICODIN) 5-325 MG tablet.  LOV 10/22/21

## 2021-11-23 ENCOUNTER — Telehealth: Payer: Self-pay | Admitting: Internal Medicine

## 2021-11-23 NOTE — Telephone Encounter (Signed)
1.Medication Requested:HYDROcodone-acetaminophen (NORCO/VICODIN) 5-325 MG tablet  2. Pharmacy (Name, Street, Dennis): [place] ([place]), [place] - [address]  Phone:  [phone] Fax:  [phone]   3. On Med List: yes  4. Last Visit with PCP: 12.15.22  5. Next visit date with PCP: n/a   Agent: Please be advised that RX refills may take up to 3 business days. We ask that you follow-up with your pharmacy.

## 2021-11-24 ENCOUNTER — Other Ambulatory Visit: Payer: Self-pay | Admitting: Internal Medicine

## 2021-11-24 DIAGNOSIS — E1140 Type 2 diabetes mellitus with diabetic neuropathy, unspecified: Secondary | ICD-10-CM

## 2021-11-24 DIAGNOSIS — G8929 Other chronic pain: Secondary | ICD-10-CM

## 2021-11-24 DIAGNOSIS — M170 Bilateral primary osteoarthritis of knee: Secondary | ICD-10-CM

## 2021-11-24 MED ORDER — HYDROCODONE-ACETAMINOPHEN 5-325 MG PO TABS: 1.0000 | ORAL_TABLET | Freq: Four times a day (QID) | ORAL | 0 refills | Status: DC | PRN

## 2021-11-24 NOTE — Telephone Encounter (Signed)
Patient calling back in to check status of refill request... says she is in a lot of pain & does not want to go to the emergency room

## 2021-12-14 ENCOUNTER — Other Ambulatory Visit: Payer: Self-pay | Admitting: Internal Medicine

## 2021-12-14 ENCOUNTER — Telehealth: Payer: Self-pay

## 2021-12-14 DIAGNOSIS — E1140 Type 2 diabetes mellitus with diabetic neuropathy, unspecified: Secondary | ICD-10-CM

## 2021-12-14 DIAGNOSIS — M170 Bilateral primary osteoarthritis of knee: Secondary | ICD-10-CM

## 2021-12-14 DIAGNOSIS — G8929 Other chronic pain: Secondary | ICD-10-CM

## 2021-12-14 MED ORDER — HYDROCODONE-ACETAMINOPHEN 5-325 MG PO TABS: 1.0000 | ORAL_TABLET | Freq: Four times a day (QID) | ORAL | 0 refills | Status: DC | PRN

## 2021-12-14 NOTE — Telephone Encounter (Signed)
Pt is calling to request refill on:  HYDROcodone-acetaminophen (NORCO/VICODIN) 5-325 MG tablet  Pt states she has 2 remaining. Last fill on 11/24/21  LOV 10/22/21  Pharmacy: [place] ([place]), [place] - [address]  Pt CB [phone]

## 2021-12-18 DIAGNOSIS — H5319 Other subjective visual disturbances: Secondary | ICD-10-CM | POA: Diagnosis not present

## 2021-12-18 DIAGNOSIS — H5711 Ocular pain, right eye: Secondary | ICD-10-CM | POA: Diagnosis not present

## 2021-12-21 DIAGNOSIS — H5340 Unspecified visual field defects: Secondary | ICD-10-CM | POA: Diagnosis not present

## 2021-12-22 ENCOUNTER — Emergency Department (HOSPITAL_COMMUNITY): Payer: Medicare Other

## 2021-12-22 ENCOUNTER — Emergency Department (HOSPITAL_COMMUNITY)
Admission: EM | Admit: 2021-12-22 | Discharge: 2021-12-22 | Disposition: A | Discharge status: Home / Self Care | Payer: Medicare Other | Attending: Emergency Medicine | Admitting: Emergency Medicine

## 2021-12-22 ENCOUNTER — Other Ambulatory Visit: Payer: Self-pay | Admitting: Internal Medicine

## 2021-12-22 ENCOUNTER — Encounter (HOSPITAL_COMMUNITY): Payer: Self-pay | Admitting: Emergency Medicine

## 2021-12-22 ENCOUNTER — Other Ambulatory Visit: Payer: Self-pay

## 2021-12-22 DIAGNOSIS — N183 Chronic kidney disease, stage 3 unspecified: Secondary | ICD-10-CM | POA: Diagnosis not present

## 2021-12-22 DIAGNOSIS — H532 Diplopia: Secondary | ICD-10-CM | POA: Diagnosis not present

## 2021-12-22 DIAGNOSIS — E1122 Type 2 diabetes mellitus with diabetic chronic kidney disease: Secondary | ICD-10-CM | POA: Diagnosis not present

## 2021-12-22 DIAGNOSIS — H538 Other visual disturbances: Secondary | ICD-10-CM | POA: Insufficient documentation

## 2021-12-22 DIAGNOSIS — I129 Hypertensive chronic kidney disease with stage 1 through stage 4 chronic kidney disease, or unspecified chronic kidney disease: Secondary | ICD-10-CM | POA: Diagnosis not present

## 2021-12-22 DIAGNOSIS — E785 Hyperlipidemia, unspecified: Secondary | ICD-10-CM

## 2021-12-22 DIAGNOSIS — Z7984 Long term (current) use of oral hypoglycemic drugs: Secondary | ICD-10-CM | POA: Diagnosis not present

## 2021-12-22 DIAGNOSIS — H547 Unspecified visual loss: Secondary | ICD-10-CM | POA: Diagnosis not present

## 2021-12-22 LAB — COMPREHENSIVE METABOLIC PANEL
ALT: 18 U/L (ref 0–44)
AST: 23 U/L (ref 15–41)
Albumin: 3.6 g/dL (ref 3.5–5.0)
Alkaline Phosphatase: 126 U/L (ref 38–126)
Anion gap: 11 (ref 5–15)
BUN: 20 mg/dL (ref 6–20)
CO2: 23 mmol/L (ref 22–32)
Calcium: 9.4 mg/dL (ref 8.9–10.3)
Chloride: 104 mmol/L (ref 98–111)
Creatinine, Ser: 1.45 mg/dL — ABNORMAL HIGH (ref 0.44–1.00)
GFR, Estimated: 41 mL/min — ABNORMAL LOW (ref >60)
Glucose, Bld: 98 mg/dL (ref 70–99)
Potassium: 3.6 mmol/L (ref 3.5–5.1)
Sodium: 138 mmol/L (ref 135–145)
Total Bilirubin: 0.2 mg/dL — ABNORMAL LOW (ref 0.3–1.2)
Total Protein: 7.1 g/dL (ref 6.5–8.1)

## 2021-12-22 LAB — CBC WITH DIFFERENTIAL/PLATELET
Abs Immature Granulocytes: 0.02 10*3/uL (ref 0.00–0.07)
Basophils Absolute: 0 10*3/uL (ref 0.0–0.1)
Basophils Relative: 1 %
Eosinophils Absolute: 0.2 10*3/uL (ref 0.0–0.5)
Eosinophils Relative: 3 %
HCT: 38.9 % (ref 36.0–46.0)
Hemoglobin: 12.8 g/dL (ref 12.0–15.0)
Immature Granulocytes: 0 %
Lymphocytes Relative: 38 %
Lymphs Abs: 2 10*3/uL (ref 0.7–4.0)
MCH: 26.4 pg (ref 26.0–34.0)
MCHC: 32.9 g/dL (ref 30.0–36.0)
MCV: 80.2 fL (ref 80.0–100.0)
Monocytes Absolute: 0.5 10*3/uL (ref 0.1–1.0)
Monocytes Relative: 9 %
Neutro Abs: 2.7 10*3/uL (ref 1.7–7.7)
Neutrophils Relative %: 49 %
Platelets: 237 10*3/uL (ref 150–400)
RBC: 4.85 MIL/uL (ref 3.87–5.11)
RDW: 16.5 % — ABNORMAL HIGH (ref 11.5–15.5)
WBC: 5.4 10*3/uL (ref 4.0–10.5)
nRBC: 0 % (ref 0.0–0.2)

## 2021-12-22 MED ORDER — GADOBUTROL 1 MMOL/ML IV SOLN
10.0000 mL | Freq: Once | INTRAVENOUS | Status: AC | PRN
  Administered 2021-12-22: 10 mL via INTRAVENOUS

## 2021-12-22 NOTE — Discharge Instructions (Signed)
Return for any problem.  MRI studies obtained today are without evidence of significant acute pathology.  It is important to follow-up closely with your ophthalmologist.  You should also follow-up closely with your regular primary care provider in the outpatient setting.  You may benefit from continued work-up with neurology in the outpatient setting.

## 2021-12-22 NOTE — ED Notes (Signed)
Patient discharge instructions reviewed with the patient. The patient verbalized understanding of instructions. Patient discharged. 

## 2021-12-22 NOTE — ED Provider Notes (Signed)
[facility] EMERGENCY DEPARTMENT Provider Note   CSN: [id number] Arrival date & time: 12/22/21  0947     History  Chief Complaint  Patient presents with   Blurred Vision   Diplopia    [name] is a 61 y.o. female.  61 year old female with prior medical history as detailed below presents with complaint of blurry vision.    She reports ongoing symptoms for at least the last month.  She denies any acute changes in vision or speech or weakness within the last week.  She reports intermittent double vision.  She reports that she was seen by ophthalmology and sent to the ED for MRI imaging.  Images of the evaluation summary of the patient by ophthalmology is included below.  Patient was seen by Dr. Brigitte Pulse on 12/21/2021.  Patient with apparent bilateral visual field defects.  Dr. Brigitte Pulse has requested MR imaging of the brain and orbits to rule out optic neuritis.   The history is provided by the patient and medical records.  Illness Location:  Visual field deficit Severity:  Mild Onset quality:  Gradual Duration:  1 month Timing:  Constant Progression:  Unchanged Chronicity:  New            Home Medications Prior to Admission medications   Medication Sig Start Date End Date Taking? Authorizing Provider  Accu-Chek FastClix Lancets MISC USE 1  TO CHECK GLUCOSE TWICE DAILY 11/29/19   [name], MD  albuterol (PROVENTIL HFA;VENTOLIN HFA) 108 (90 Base) MCG/ACT inhaler Inhale 2 puffs into the lungs every 4 (four) hours as needed for wheezing or shortness of breath (cough, shortness of breath or wheezing.). 03/11/18   Robyn Haber, MD  blood glucose meter kit and supplies KIT Use to test blood sugar twice daily. DX: E11.8 07/30/19   [name], MD  budesonide-formoterol (SYMBICORT) 80-4.5 MCG/ACT inhaler Inhale 2 puffs into the lungs 2 (two) times daily as needed (shortness of breath, wheezing). Via [place] [employer] patient assistance 05/20/21   [name], MD  Cholecalciferol (VITAMIN D3) 25 MCG (1000 UT) CAPS Take 1 capsule (1,000 Units total) by mouth daily. 08/28/21   Burns, Claudina Lick, MD  Finerenone (KERENDIA) 10 MG TABS Take 1 tablet by mouth daily. 10/23/21   [name], MD  glucose blood (ACCU-CHEK GUIDE) test strip Use as instructed twice daily. 11/29/19   [name], MD  HYDROcodone-acetaminophen (NORCO/VICODIN) 5-325 MG tablet Take 1 tablet by mouth every 6 (six) hours as needed for severe pain. 12/14/21   [name], MD  L-Methylfolate-Algae-B12-B6 [facility]) 3-90.314-2-35 MG CAPS Take 1 capsule by mouth 2 (two) times daily. 10/23/21   [name], MD  levocetirizine (XYZAL) 5 MG tablet TAKE 1 TABLET BY MOUTH ONCE DAILY IN THE EVENING 10/15/21   [name], MD  potassium chloride (KLOR-CON) 10 MEQ tablet Take 1 tablet by mouth twice daily 11/04/21   [name], MD  pregabalin (LYRICA) 50 MG capsule Take 1 capsule (50 mg total) by mouth at bedtime. 09/21/21   [name], MD  rosuvastatin (CRESTOR) 20 MG tablet Take 1 tablet by mouth once daily 09/16/21   [name], MD  tiZANidine (ZANAFLEX) 4 MG tablet TAKE 1 TABLET BY MOUTH ONCE DAILY AS NEEDED FOR MUSCLE SPASM 10/19/21   [name], MD  triamcinolone cream (KENALOG) 0.5 % Apply 1 application topically 3 (three) times daily. 11/11/20   [name], MD  triamterene-hydrochlorothiazide [facility])  37.5-25 MG capsule Take 1 each (1 capsule total) by mouth daily. 05/04/21   [name], MD  vitamin B-12 (CYANOCOBALAMIN) 1000 MCG tablet Take 1 tablet (1,000 mcg total) by mouth daily. 08/28/21   Binnie Rail, MD      Allergies    Verapamil    Review of Systems   Review of Systems  All other systems reviewed and are negative.  Physical Exam Updated Vital Signs BP 110/78    Pulse 64    Temp 98.2 F (36.8 C) (Oral)    Resp 17    LMP 05/17/2012    SpO2 98%  Physical Exam Vitals and nursing note reviewed.  Constitutional:      General:  She is not in acute distress.    Appearance: Normal appearance. She is well-developed.  HENT:     Head: Normocephalic and atraumatic.  Eyes:     Conjunctiva/sclera: Conjunctivae normal.     Pupils: Pupils are equal, round, and reactive to light.  Cardiovascular:     Rate and Rhythm: Normal rate and regular rhythm.     Heart sounds: Normal heart sounds.  Pulmonary:     Effort: Pulmonary effort is normal. No respiratory distress.     Breath sounds: Normal breath sounds.  Abdominal:     General: There is no distension.     Palpations: Abdomen is soft.     Tenderness: There is no abdominal tenderness.  Musculoskeletal:        General: No deformity. Normal range of motion.     Cervical back: Normal range of motion and neck supple.  Skin:    General: Skin is warm and dry.  Neurological:     General: No focal deficit present.     Mental Status: She is alert and oriented to person, place, and time. Mental status is at baseline.     Cranial Nerves: No cranial nerve deficit.     Sensory: No sensory deficit.     Motor: No weakness.     Coordination: Coordination normal.    ED Results / Procedures / Treatments   Labs (all labs ordered are listed, but only abnormal results are displayed) Labs Reviewed  COMPREHENSIVE METABOLIC PANEL - Abnormal; Notable for the following components:      Result Value   Creatinine, Ser 1.45 (*)    Total Bilirubin 0.2 (*)    GFR, Estimated 41 (*)    All other components within normal limits  CBC WITH DIFFERENTIAL/PLATELET - Abnormal; Notable for the following components:   RDW 16.5 (*)    All other components within normal limits  URINALYSIS, ROUTINE W REFLEX MICROSCOPIC    EKG None  Radiology No results found.  Procedures Procedures    Medications Ordered in ED Medications - No data to display  ED Course/ Medical Decision Making/ A&P                           Medical Decision Making Amount and/or Complexity of Data Reviewed Labs:  ordered. Radiology: ordered.  Risk Prescription drug management.    Medical Screen Complete  This patient presented to the ED with complaint of bilateral visual field deficit.  Sent for MR imaging.  This complaint involves an extensive number of treatment options. The initial differential diagnosis includes, but is not limited to, optic neuritis, intracranial pathology, metabolic abnormality  This presentation is: Acute, Chronic, Self-Limited, Previously Undiagnosed, Uncertain Prognosis, Complicated, Systemic Symptoms, and Threat to  Life/Bodily Function  Patient presented with complaint of blurry vision.  This is been ongoing for at least 1 month  She was seen by ophthalmology yesterday in the outpatient setting.  She was referred to the ED for MR to rule out possible optic neuritis.  She is otherwise without complaint.  MR studies obtained are without significant acute pathology identified.  Case reviewed briefly with Dr. Reeves Forth of neurology.  He agrees with plan for outpatient follow-up with both ophthalmology, PCP, and neurology.  Patient understands need for close outpatient follow-up.  She understands strict return precautions given.   Co morbidities that complicated the patient's evaluation  Hypertension diabetes, CKD, hyperlipidemia   Additional history obtained:  External records from outside sources obtained and reviewed including prior ED visits and prior Inpatient records.    Lab Tests:  I ordered and personally interpreted labs.  The pertinent results include: CBC, CMP   Imaging Studies ordered:  I ordered imaging studies including MRI brain and orbit I independently visualized and interpreted obtained imaging which showed no acute pathology I agree with the radiologist interpretation.   Cardiac Monitoring:  The patient was maintained on a cardiac monitor.  I personally viewed and interpreted the cardiac monitor which showed an underlying rhythm of:  NSR  Problem List / ED Course:  Blurry vision   Reevaluation:  After the interventions noted above, I reevaluated the patient and found that they have: stayed the same    Disposition:  After consideration of the diagnostic results and the patients response to treatment, I feel that the patent would benefit from close outpatient follow-up as directed with ophthalmology, PCP, and neurology.          Final Clinical Impression(s) / ED Diagnoses Final diagnoses:  Blurred vision    Rx / DC Orders ED Discharge Orders     None         Valarie Merino, MD 12/22/21 1152

## 2021-12-22 NOTE — ED Triage Notes (Signed)
Pt. Stated, My Dr. Durene Cal me here cause Ive had blurred vision and double vision for a week.

## 2021-12-29 ENCOUNTER — Encounter: Payer: Self-pay | Admitting: Neurology

## 2021-12-29 ENCOUNTER — Ambulatory Visit: Payer: Medicare Other | Admitting: Neurology

## 2021-12-29 VITALS — BP 122/79 | HR 79 | Ht 61.5 in | Wt 241.5 lb

## 2021-12-29 DIAGNOSIS — H539 Unspecified visual disturbance: Secondary | ICD-10-CM

## 2021-12-29 DIAGNOSIS — M159 Polyosteoarthritis, unspecified: Secondary | ICD-10-CM | POA: Insufficient documentation

## 2021-12-29 NOTE — Progress Notes (Signed)
SLEEP MEDICINE CLINIC    Provider:  Larey Seat, MD  Primary Care Physician:  [name], MD [place] [address]     Referring Provider: [name], [place],  [place] [address]          Chief Complaint according to patient   Patient presents with:     New Patient (Initial Visit)           HISTORY OF PRESENT ILLNESS:  [name] is a 61 y.o. African American female patient seen here upon ED referral on 12/29/2021 from ED/ OD for an evaluation of  eye pain .  Chief concern according to patient :  Pt referred by ED for blurred vision. Pt seen at [facility] ER on 2/14 for blurred vision. Blurred vision is still the same. In both eyes, uses bifocal glasses, which she recently got about a month ago. Has used bifocal glasses last 3 years. Pt reports having bilateral foot pn/numbness and was prescribed pregabalin and has not helped and only caused SE, such as urinary frequency.    I have the pleasure of seeing [name] today, a right -handed Black or [place] American female who  has a past medical history of Allergy, Anemia, Osteo-Arthritis, Asthma, Diabetes mellitus without NP, ([place]), Headache(784.0), super obesity,  History of stomach ulcers, Hypertension, and Internal hemorrhoids.     Review of Systems: Out of a complete 14 system review, the patient complains of only the following symptoms, and all other reviewed systems are negative.:    Hip pain, back pain, nerve pain, knee pain and blurred vision.  Social History   Socioeconomic History   Marital status: Married    Spouse name: Hall Busing   Number of children: 4   Years of education: 12   Highest education level: Not on file  Occupational History   Occupation: disabilty  Tobacco Use   Smoking status: Never    Passive exposure: Yes   Smokeless tobacco: Never   Tobacco comments:    husband smokes outside and inside the home  Vaping Use   Vaping Use: Never  used  Substance and Sexual Activity   Alcohol use: No    Alcohol/week: 0.0 standard drinks   Drug use: No   Sexual activity: Yes    Birth control/protection: Post-menopausal, Surgical  Other Topics Concern   Not on file  Social History Narrative   Lives at home w/ husband   Right handed   Drinks caffeine once a week    Social Determinants of Health   Financial Resource Strain: Not on file  Food Insecurity: Not on file  Transportation Needs: Not on file  Physical Activity: Not on file  Stress: Not on file  Social Connections: Not on file    Family History  Problem Relation Age of Onset   Arthritis Other    Diabetes Other    Hypertension Other    Breast cancer Other    Hypertension Mother    Diabetes Sister    Hypertension Sister    Breast cancer Daughter    Cancer Neg Hx    Early death Neg Hx    Hearing loss Neg Hx    Heart disease Neg Hx    Hyperlipidemia Neg Hx    Kidney disease Neg Hx    Stroke Neg Hx    Colon cancer Neg Hx     Past Medical History:  Diagnosis Date  Allergy    rhinitis   Anemia    Arthritis    Asthma    Diabetes mellitus without complication (HCC)    IWOEHOZY(248.2)    History of stomach ulcers    Hypertension    Internal hemorrhoids     Past Surgical History:  Procedure Laterality Date   CARPAL TUNNEL RELEASE Right    TUBAL LIGATION       Current Outpatient Medications on File Prior to Visit  Medication Sig Dispense Refill   Accu-Chek FastClix Lancets MISC USE 1  TO CHECK GLUCOSE TWICE DAILY 102 each 3   albuterol (PROVENTIL HFA;VENTOLIN HFA) 108 (90 Base) MCG/ACT inhaler Inhale 2 puffs into the lungs every 4 (four) hours as needed for wheezing or shortness of breath (cough, shortness of breath or wheezing.). 1 Inhaler 1   blood glucose meter kit and supplies KIT Use to test blood sugar twice daily. DX: E11.8 1 each 0   budesonide-formoterol (SYMBICORT) 80-4.5 MCG/ACT inhaler Inhale 2 puffs into the lungs 2 (two) times daily as  needed (shortness of breath, wheezing). Via [place] & [place] patient assistance 1 each 3   Cholecalciferol (VITAMIN D3) 25 MCG (1000 UT) CAPS Take 1 capsule (1,000 Units total) by mouth daily. 90 capsule 3   Finerenone (KERENDIA) 10 MG TABS Take 1 tablet by mouth daily. 30 tablet 0   glucose blood (ACCU-CHEK GUIDE) test strip Use as instructed twice daily. 300 each 1   HYDROcodone-acetaminophen (NORCO/VICODIN) 5-325 MG tablet Take 1 tablet by mouth every 6 (six) hours as needed for severe pain. 65 tablet 0   L-Methylfolate-Algae-B12-B6 (METANX) 3-90.314-2-35 MG CAPS Take 1 capsule by mouth 2 (two) times daily. 180 capsule 3   levocetirizine (XYZAL) 5 MG tablet TAKE 1 TABLET BY MOUTH ONCE DAILY IN THE EVENING 90 tablet 1   potassium chloride (KLOR-CON) 10 MEQ tablet Take 1 tablet by mouth twice daily 180 tablet 0   pregabalin (LYRICA) 50 MG capsule Take 1 capsule (50 mg total) by mouth at bedtime. 90 capsule 0   rosuvastatin (CRESTOR) 20 MG tablet Take 1 tablet by mouth once daily 90 tablet 0   tiZANidine (ZANAFLEX) 4 MG tablet TAKE 1 TABLET BY MOUTH ONCE DAILY AS NEEDED FOR MUSCLE SPASM 30 tablet 0   triamcinolone cream (KENALOG) 0.5 % Apply 1 application topically 3 (three) times daily. 454 g 1   triamterene-hydrochlorothiazide (DYAZIDE) 37.5-25 MG capsule Take 1 each (1 capsule total) by mouth daily. 90 capsule 1   vitamin B-12 (CYANOCOBALAMIN) 1000 MCG tablet Take 1 tablet (1,000 mcg total) by mouth daily. 90 tablet 3   No current facility-administered medications on file prior to visit.    Allergies  Allergen Reactions   Verapamil Swelling    Physical exam:  Today's Vitals   12/29/21 0850  BP: 122/79  Pulse: 79  Weight: 241 lb 8 oz (109.5 kg)  Height: 5' 1.5" (1.562 m)   Body mass index is 44.89 kg/m.   Wt Readings from Last 3 Encounters:  12/29/21 241 lb 8 oz (109.5 kg)  10/22/21 244 lb (110.7 kg)  08/28/21 230 lb (104.3 kg)     Ht Readings from Last 3 Encounters:  12/29/21 5'  1.5" (1.562 m)  10/22/21 $RemoveB'5\' 1"'xxfdltFB$  (1.549 m)  08/28/21 $RemoveB'5\' 1"'tqBUFhaI$  (1.549 m)      General: The patient is awake, alert and appears not in acute distress. The patient is well groomed. She smells strongly of tobacco smoke, but is not a smoker herself.  Physical  exam:   General: The patient is awake, alert and appears not in acute distress. The patient is well groomed. Head: Normocephalic, atraumatic. Neck is supple. Mallampati 3, poor dentition, ,  neck circumference:16.25 . Nasal airflow congested , TMJ is  Not  evident . Retrognathia is not seen.  Cardiovascular:  Regular rate and rhythm, without  murmurs or carotid bruit, and without distended neck veins. Respiratory: Lungs are clear to auscultation. Skin:  Without evidence of edema, or rash Trunk: BMI is 45 .  The patient's posture is stooped, she is in pain.    Neurologic exam : The patient is awake and alert, oriented to place and time.    Attention span & concentration ability appears limited,Speech is fluent,  with dysarthria and  dysphonia .  Mood and affect are appropriate.   Cranial nerves: Pupils are equal and briskly reactive to light. Funduscopic exam without evidence of pallor or edema. Extraocular movements  in vertical and horizontal planes intact and without nystagmus.  Visual fields by finger perimetry are intact. Hearing impairment.   Facial sensation intact to fine touch.  Facial motor strength is symmetric and tongue and uvula move midline. Shoulder shrug was symmetrical.    Motor exam: Normal tone, muscle bulk - she did provide only 4/5 strength hip adduction, flexion, abduction and knee extension.  Reports knee and muscle pain. Joint pain .  Sensory: felt vibration in both ankles and at both knees and hands.  She reports burning on top of her feet- not in toes and not above ankle !   Coordination: Rapid alternating movements / Finger-to-nose maneuver  abnormal with left sided intention tremor- on the dominant right there  was no evidence of ataxia, dysmetria or tremor. Denies handwriting changes, non problems typing.    Gait and station: Patient walks,  waddling wide based, feet point outwards. No assistive device.  She braces herself when rising. She leans on a shopping card when in a supermarket and that helps.  She was able to perform a tandem gait. She is afraid that her knees will buckle.     MRI HEAD FINDINGS   Brain: No restricted diffusion to suggest acute or subacute infarct. No acute hemorrhage, mass, mass effect, or midline shift. No hydrocephalus or extra-axial collection. T2 hyperintense signal in the periventricular white matter and pons, likely the sequela of moderate chronic small vessel ischemic disease. No abnormal parenchymal or meningeal enhancement.   Vascular: Normal flow voids.   Skull and upper cervical spine: Normal marrow signal.   Other: Trace fluid in the mastoid air cells.   MRI ORBITS FINDINGS   Orbits: No traumatic or inflammatory finding. Globes, optic nerves, orbital fat, extraocular muscles, vascular structures, and lacrimal glands are normal.   Visualized sinuses: Clear.   Soft tissues: Negative.   IMPRESSION: No acute intracranial process. No etiology is seen to explain the patient's vision issues.     Electronically Signed   By: Merilyn Baba M.D.   On: 12/22/2021 11:08   I spent more than 45 minutes of face to face time with the patient. Greater than 50% of time was spent in counseling and coordination of care. We have discussed the diagnosis and differential and I answered the patient's questions.         After spending a total time of  45  minutes:  face to face and additional time for physical and neurologic examination, review of laboratory studies,  personal review of imaging studies, reports and results of  other testing and review of referral information / records as far as provided in visit, I have established the following assessments:  1)  I followed this patient in 12-2016 for sleep- had no OSA confimred- no follow up needed.     2) meanwhile diabetic ,still morbidly obese patient with a history of osteoarthritis in both knees ( 10 years plus, cause for her disability claim) , lumbar back pain and sciatica. Distal peripheral Neuropathy.  That's not what I follow her for- please defer to weight loss clinic, PT and pain management. Orthopedic knee treatment?  Was prescribed an injectable medication which she chose not to even try. She is followed by a pain clinic.  2) reported  vision changes- eye pain got better with her new glasses. She reports no restriction of visual field. Diplopia, blurring when not using bifocal glasses- she got these just 12-15-2021! .  3) normal retinae and optic nerve. No glaucoma. No pseudotumor cerebri/normal MRI  brain and orbit, normal vision testing and nl retinoscopy.      My Plan is to proceed with:  1)no follow up in my clinic is necessary.   There is no evidence of MS, of visual field restriction, and no correlation to intracranial pressure changes.   2) PT, weight loss and DM control with PCP, pain treatment with pain clinic.    [name], MD , CC: I will share my notes with PCP.  I would appreciate if any future visits can be arranged by PCP, and not ED.   Electronically signed by: Larey Seat, MD 12/29/2021 9:20 AM  [facility] Board certified by The [employer] of Sleep Medicine and Diplomate of the [employer] of Sleep Medicine. Board certified In Neurology through the [place], Fellow of the [employer] of Neurology. Medical Director of [facility].

## 2022-01-06 ENCOUNTER — Ambulatory Visit: Payer: Medicare Other | Admitting: Neurology

## 2022-01-25 ENCOUNTER — Telehealth: Payer: Self-pay | Admitting: Internal Medicine

## 2022-01-25 NOTE — Telephone Encounter (Signed)
1.Medication Requested:HYDROcodone-acetaminophen (NORCO/VICODIN) 5-325 MG tablet ? ?2. Pharmacy (Name, Street, [place]): [facility] [place] - [address]  ?Phone:  [phone] ?Fax:  [phone] ? ? ?3. On Med List: yes ? ?4. Last Visit with PCP: 12.15.22 ? ?5. Next visit date with PCP: n/a ? ? ?Agent: Please be advised that RX refills may take up to 3 business days. We ask that you follow-up with your pharmacy.  ?

## 2022-01-27 ENCOUNTER — Other Ambulatory Visit: Payer: Self-pay | Admitting: Internal Medicine

## 2022-01-27 DIAGNOSIS — I10 Essential (primary) hypertension: Secondary | ICD-10-CM

## 2022-01-27 DIAGNOSIS — E876 Hypokalemia: Secondary | ICD-10-CM

## 2022-01-27 MED ORDER — TRIAMTERENE-HCTZ 37.5-25 MG PO CAPS: 1.0000 | ORAL_CAPSULE | Freq: Every day | ORAL | 0 refills | Status: DC

## 2022-01-27 NOTE — Telephone Encounter (Signed)
1.Medication Requested: triamterene-hydrochlorothiazide (DYAZIDE) 37.5-25 MG capsule ? ?2. Pharmacy (Name, Street, [place]): [facility] [place] - [address]          ?Phone:      [phone] ?Fax:           [phone] ? ?3. On Med List: yes ? ?4. Last Visit with PCP: 12.15.22 ? ?5. Next visit date with PCP: n/a ? ?**Patient also wanted to check the status of prev refill request sent 03/20 for HYDROcodone-acetaminophen (NORCO/VICODIN) 5-325 MG tablet.Marland Kitchen advised patient of 3BD turnaround time** ? ? ?Agent: Please be advised that RX refills may take up to 3 business days. We ask that you follow-up with your pharmacy.  ?

## 2022-01-28 ENCOUNTER — Other Ambulatory Visit: Payer: Self-pay | Admitting: Internal Medicine

## 2022-01-28 DIAGNOSIS — E1140 Type 2 diabetes mellitus with diabetic neuropathy, unspecified: Secondary | ICD-10-CM

## 2022-01-28 DIAGNOSIS — G8929 Other chronic pain: Secondary | ICD-10-CM

## 2022-01-28 DIAGNOSIS — M170 Bilateral primary osteoarthritis of knee: Secondary | ICD-10-CM

## 2022-01-28 MED ORDER — HYDROCODONE-ACETAMINOPHEN 5-325 MG PO TABS: 1.0000 | ORAL_TABLET | Freq: Four times a day (QID) | ORAL | 0 refills | Status: DC | PRN

## 2022-01-28 NOTE — Telephone Encounter (Signed)
1.Medication Requested:HYDROcodone-acetaminophen (NORCO/VICODIN) 5-325 MG tablet ?  ?2. Pharmacy (Name, Street, [place]): [facility] [place] - [address]          ?Phone:      [phone] ?Fax:           [phone] ?  ?  ?3. On Med List: yes ?  ?4. Last Visit with PCP: 12.15.22 ?  ?5. Next visit date with PCP: n/a ? ?**Patient called to refill 03/20 & she says she spoke w/ pharmacy & they advised it has not been received yet** ?

## 2022-02-05 ENCOUNTER — Encounter: Payer: Self-pay | Admitting: Family Medicine

## 2022-02-05 ENCOUNTER — Ambulatory Visit (INDEPENDENT_AMBULATORY_CARE_PROVIDER_SITE_OTHER): Payer: Medicare Other | Admitting: Family Medicine

## 2022-02-05 VITALS — BP 120/78 | HR 70 | Temp 97.6°F | Ht 61.5 in | Wt 247.0 lb

## 2022-02-05 DIAGNOSIS — M170 Bilateral primary osteoarthritis of knee: Secondary | ICD-10-CM

## 2022-02-05 DIAGNOSIS — G894 Chronic pain syndrome: Secondary | ICD-10-CM

## 2022-02-05 DIAGNOSIS — J4520 Mild intermittent asthma, uncomplicated: Secondary | ICD-10-CM

## 2022-02-05 DIAGNOSIS — M7062 Trochanteric bursitis, left hip: Secondary | ICD-10-CM | POA: Diagnosis not present

## 2022-02-05 MED ORDER — BUDESONIDE-FORMOTEROL FUMARATE 80-4.5 MCG/ACT IN AERO: 2.0000 | INHALATION_SPRAY | Freq: Two times a day (BID) | RESPIRATORY_TRACT | 0 refills | Status: DC | PRN

## 2022-02-05 NOTE — Patient Instructions (Signed)
Call and schedule with [facility] ? ?Dr. Dian Situ ?[place] ?[place] ? ?[phone] ?

## 2022-02-05 NOTE — Progress Notes (Signed)
? ?Subjective:  ? ? Patient ID: [name], female    DOB: 03-Aug-1961, 61 y.o.   MRN: [id number] ? ?HPI ?Chief Complaint  ?Patient presents with  ? Hip Pain  ?  Left hip pain, was going to pain specialist who used to give her shots in her hip but she has since stopped going.   ? ?Complains of left hip pain. Pain is only when laying on her left side. Denies pain at present or with walking. This is chronic and unchanged from her usual pain. She was seeing pain management and was receiving injections to the area. She stopped going to pain management approximately one year ago.  States the pain seems to have worsened over the past 2 weeks.  ? ?Denies numbness, tingling or weakness.  ? ?She is taking hydrocodone-tylenol 5-325 for other pain and states it does not help with her hip pain.  ? ?She also requests a refill of Symbicort. States her allergies are causing asthma to flare up and she has done well with Symbicort. No cough or wheezing today.  ? ? ?Denies fever, chills, dizziness, chest pain, palpitations, shortness of breath, abdominal pain, N/V/D, urinary symptoms.  ? ?Past Medical History:  ?Diagnosis Date  ? Allergy   ? rhinitis  ? Anemia   ? Arthritis   ? Asthma   ? Diabetes mellitus without complication ([place])   ? Headache(784.0)   ? History of stomach ulcers   ? Hypertension   ? Internal hemorrhoids   ? ?Current Outpatient Medications on File Prior to Visit  ?Medication Sig Dispense Refill  ? Accu-Chek FastClix Lancets MISC USE 1  TO CHECK GLUCOSE TWICE DAILY 102 each 3  ? albuterol (PROVENTIL HFA;VENTOLIN HFA) 108 (90 Base) MCG/ACT inhaler Inhale 2 puffs into the lungs every 4 (four) hours as needed for wheezing or shortness of breath (cough, shortness of breath or wheezing.). 1 Inhaler 1  ? blood glucose meter kit and supplies KIT Use to test blood sugar twice daily. DX: E11.8 1 each 0  ? Cholecalciferol (VITAMIN D3) 25 MCG (1000 UT) CAPS Take 1 capsule (1,000 Units total) by mouth daily. 90 capsule 3  ?  glucose blood (ACCU-CHEK GUIDE) test strip Use as instructed twice daily. 300 each 1  ? HYDROcodone-acetaminophen (NORCO/VICODIN) 5-325 MG tablet Take 1 tablet by mouth every 6 (six) hours as needed for severe pain. 65 tablet 0  ? L-Methylfolate-Algae-B12-B6 (METANX) 3-90.314-2-35 MG CAPS Take 1 capsule by mouth 2 (two) times daily. 180 capsule 3  ? levocetirizine (XYZAL) 5 MG tablet TAKE 1 TABLET BY MOUTH ONCE DAILY IN THE EVENING 90 tablet 1  ? potassium chloride (KLOR-CON) 10 MEQ tablet Take 1 tablet by mouth twice daily 180 tablet 0  ? pregabalin (LYRICA) 50 MG capsule Take 1 capsule (50 mg total) by mouth at bedtime. 90 capsule 0  ? rosuvastatin (CRESTOR) 20 MG tablet Take 1 tablet by mouth once daily 90 tablet 0  ? tiZANidine (ZANAFLEX) 4 MG tablet TAKE 1 TABLET BY MOUTH ONCE DAILY AS NEEDED FOR MUSCLE SPASM 30 tablet 0  ? triamcinolone cream (KENALOG) 0.5 % Apply 1 application topically 3 (three) times daily. 454 g 1  ? triamterene-hydrochlorothiazide (DYAZIDE) 37.5-25 MG capsule Take 1 each (1 capsule total) by mouth daily. 90 capsule 0  ? vitamin B-12 (CYANOCOBALAMIN) 1000 MCG tablet Take 1 tablet (1,000 mcg total) by mouth daily. 90 tablet 3  ? ?No current facility-administered medications on file prior to visit.  ? ? ? ? ? ?  Review of Systems ?Pertinent positives and negatives in the history of present illness. ? ?   ?Objective:  ? Physical Exam ?Constitutional:   ?   General: She is not in acute distress. ?   Appearance: She is not ill-appearing.  ?Pulmonary:  ?   Effort: Pulmonary effort is normal.  ?Skin: ?   General: Skin is warm and dry.  ?Neurological:  ?   General: No focal deficit present.  ?   Mental Status: She is alert.  ?   Coordination: Coordination normal.  ?   Gait: Gait abnormal.  ? ?BP 120/78 (BP Location: Right Arm, Patient Position: Sitting, Cuff Size: Large)   Pulse 70   Temp 97.6 ?F (36.4 ?C) (Temporal)   Ht 5' 1.5" (1.562 m)   Wt 247 lb (112 kg)   LMP 05/17/2012   SpO2 95%    BMI 45.91 kg/m?  ? ? ? ? ?   ?Assessment & Plan:  ?Chronic pain syndrome ? ?Mild intermittent asthma without complication - Plan: budesonide-formoterol (SYMBICORT) 80-4.5 MCG/ACT inhaler ? ?Primary osteoarthritis of both knees ? ?Trochanteric bursitis of left hip ? ?Refilled Symbicort.  ?She does not appear to be in any acute distress.  She will continue taking her usual pain medication.  Discussed trying topical analgesic as well as ice or heat to her left hip.  I provided her with her pain management providers office information and she will call to schedule an appointment. ?

## 2022-02-12 ENCOUNTER — Other Ambulatory Visit: Payer: Self-pay | Admitting: Internal Medicine

## 2022-02-12 DIAGNOSIS — T50905A Adverse effect of unspecified drugs, medicaments and biological substances, initial encounter: Secondary | ICD-10-CM

## 2022-02-12 DIAGNOSIS — I10 Essential (primary) hypertension: Secondary | ICD-10-CM

## 2022-02-22 ENCOUNTER — Ambulatory Visit: Payer: Medicare Other | Admitting: Obstetrics and Gynecology

## 2022-02-22 ENCOUNTER — Encounter: Payer: Self-pay | Admitting: Obstetrics and Gynecology

## 2022-02-22 ENCOUNTER — Other Ambulatory Visit (HOSPITAL_COMMUNITY)
Admission: RE | Admit: 2022-02-22 | Discharge: 2022-02-22 | Disposition: A | Discharge status: Home / Self Care | Payer: Medicare Other | Source: Ambulatory Visit | Attending: Obstetrics and Gynecology | Admitting: Obstetrics and Gynecology

## 2022-02-22 VITALS — BP 103/71 | HR 65 | Ht 61.0 in | Wt 248.5 lb

## 2022-02-22 DIAGNOSIS — Z01419 Encounter for gynecological examination (general) (routine) without abnormal findings: Secondary | ICD-10-CM

## 2022-02-22 DIAGNOSIS — Z1151 Encounter for screening for human papillomavirus (HPV): Secondary | ICD-10-CM | POA: Insufficient documentation

## 2022-02-22 NOTE — Progress Notes (Signed)
Subjective:  ?  ? [name] is a 61 y.o. female and is here for a comprehensive physical exam. The patient reports no problems. She denies any episodes of postmenopausal vaginal bleeding. She denies pelvic pain or abnormal discharge. She is not sexually active. She denies urinary incontinence. She denies any constipation. Patient is without any complaints ? ?Past Medical History:  ?Diagnosis Date  ? Allergy   ? rhinitis  ? Anemia   ? Arthritis   ? Asthma   ? Diabetes mellitus without complication ([place])   ? Headache(784.0)   ? History of stomach ulcers   ? Hypertension   ? Internal hemorrhoids   ? ?Past Surgical History:  ?Procedure Laterality Date  ? CARPAL TUNNEL RELEASE Right   ? TUBAL LIGATION    ? ?Family History  ?Problem Relation Age of Onset  ? Arthritis Other   ? Diabetes Other   ? Hypertension Other   ? Breast cancer Other   ? Hypertension Mother   ? Diabetes Sister   ? Hypertension Sister   ? Breast cancer Daughter   ? Cancer Neg Hx   ? Early death Neg Hx   ? Hearing loss Neg Hx   ? Heart disease Neg Hx   ? Hyperlipidemia Neg Hx   ? Kidney disease Neg Hx   ? Stroke Neg Hx   ? Colon cancer Neg Hx   ? ? ?Social History  ? ?Socioeconomic History  ? Marital status: Married  ?  Spouse name: Hall Busing  ? Number of children: 4  ? Years of education: 49  ? Highest education level: Not on file  ?Occupational History  ? Occupation: disabilty  ?Tobacco Use  ? Smoking status: Never  ?  Passive exposure: Yes  ? Smokeless tobacco: Never  ? Tobacco comments:  ?  husband smokes outside and inside the home  ?Vaping Use  ? Vaping Use: Never used  ?Substance and Sexual Activity  ? Alcohol use: No  ?  Alcohol/week: 0.0 standard drinks  ? Drug use: No  ? Sexual activity: Yes  ?  Birth control/protection: Post-menopausal, Surgical  ?Other Topics Concern  ? Not on file  ?Social History Narrative  ? Lives at home w/ husband  ? Right handed  ? Drinks caffeine once a week   ? ?Social Determinants of Health  ? ?Financial  Resource Strain: Not on file  ?Food Insecurity: Not on file  ?Transportation Needs: Not on file  ?Physical Activity: Not on file  ?Stress: Not on file  ?Social Connections: Not on file  ?Intimate Partner Violence: Not on file  ? ?Health Maintenance  ?Topic Date Due  ? Zoster Vaccines- Shingrix (1 of 2) Never done  ? COVID-19 Vaccine (3 - Pfizer risk series) 03/12/2020  ? PAP SMEAR-Modifier  07/07/2021  ? FOOT EXAM  04/16/2022  ? HEMOGLOBIN A1C  04/22/2022  ? OPHTHALMOLOGY EXAM  05/16/2022  ? INFLUENZA VACCINE  06/08/2022  ? URINE MICROALBUMIN  10/22/2022  ? MAMMOGRAM  05/31/2023  ? COLONOSCOPY (Pts 45-68yr Insurance coverage will need to be confirmed)  01/29/2026  ? TETANUS/TDAP  07/18/2029  ? Hepatitis C Screening  Completed  ? HIV Screening  Completed  ? HPV VACCINES  Aged Out  ? ? ?  ? ?Review of Systems ?Pertinent items noted in HPI and remainder of comprehensive ROS otherwise negative.  ? ?Objective:  ?Blood pressure 103/71, pulse 65, height '5\' 1"'$  (1.549 m), weight 248 lb 8 oz (112.7 kg), last menstrual period 05/17/2012. ?  GENERAL: Well-developed, well-nourished female in no acute distress.  ?HEENT: Normocephalic, atraumatic. Sclerae anicteric.  ?NECK: Supple. Normal thyroid.  ?LUNGS: Clear to auscultation bilaterally.  ?HEART: Regular rate and rhythm. ?BREASTS: Symmetric in size. No palpable masses or lymphadenopathy, skin changes, or nipple drainage. ?ABDOMEN: Soft, nontender, nondistended. No organomegaly. ?PELVIC: Normal external female genitalia. Vagina is pink and rugated.  Normal discharge. Normal appearing cervix. Uterus is normal in size. No adnexal mass or tenderness. Chaperone present during the pelvic exam ?EXTREMITIES: No cyanosis, clubbing, or edema, 2+ distal pulses. ?  ?  ?Assessment:  ? ? Healthy female exam.    ?  ?Plan:  ? ? Pap smear collected ?Screening mammogram ordered not sue until 05/2022 ?Patient not due for colonoscopy until 2027 ?Patient will be contacted with abnormal  results ?Follow up with PCP as scheduled ?See After Visit Summary for Counseling Recommendations  ? ?

## 2022-02-22 NOTE — Progress Notes (Signed)
Referral from [facility], pt does not know why she was referred. ?Denies complaints, bleeding, abdominal pain. ?Last Pap 2019, WNL ?Mammo 05/30/21, Order pended for this year. ?

## 2022-02-23 LAB — CYTOLOGY - PAP
Comment: NEGATIVE
Diagnosis: NEGATIVE
High risk HPV: NEGATIVE

## 2022-03-01 ENCOUNTER — Telehealth: Payer: Self-pay | Admitting: Internal Medicine

## 2022-03-01 ENCOUNTER — Other Ambulatory Visit: Payer: Self-pay | Admitting: Internal Medicine

## 2022-03-01 DIAGNOSIS — M170 Bilateral primary osteoarthritis of knee: Secondary | ICD-10-CM

## 2022-03-01 DIAGNOSIS — E1140 Type 2 diabetes mellitus with diabetic neuropathy, unspecified: Secondary | ICD-10-CM

## 2022-03-01 DIAGNOSIS — G8929 Other chronic pain: Secondary | ICD-10-CM

## 2022-03-01 MED ORDER — HYDROCODONE-ACETAMINOPHEN 5-325 MG PO TABS: 1.0000 | ORAL_TABLET | Freq: Four times a day (QID) | ORAL | 0 refills | Status: DC | PRN

## 2022-03-01 NOTE — Telephone Encounter (Signed)
1.Medication Requested: HYDROcodone-acetaminophen (NORCO/VICODIN) 5-325 MG tablet ? ?2. Pharmacy (Name, Street, [place]): [facility] [phone] - [place], [place] ? ?3. On Med List: Y ? ?4. Last Visit with PCP: 10-22-2021 ? ?5. Next visit date with PCP: n/a ? ? ?Agent: Please be advised that RX refills may take up to 3 business days. We ask that you follow-up with your pharmacy.  ?

## 2022-03-01 NOTE — Telephone Encounter (Signed)
Pt requesting a referral to a pain specialist or ortho due to severe back pain pt states provider is aware of  ?

## 2022-03-25 ENCOUNTER — Encounter: Payer: Self-pay | Admitting: Surgery

## 2022-03-25 ENCOUNTER — Ambulatory Visit: Payer: Medicare Other | Admitting: Surgery

## 2022-03-25 DIAGNOSIS — M4726 Other spondylosis with radiculopathy, lumbar region: Secondary | ICD-10-CM | POA: Diagnosis not present

## 2022-03-25 NOTE — Progress Notes (Signed)
Office Visit Note   Patient: [name]           Date of Birth: Mar 04, 1961           MRN: [id number] Visit Date: 03/25/2022              Requested by: [name], MD [address] [place],  [place] [address] PCP: [name], MD   Assessment & Plan: Visit Diagnoses:  1. Other spondylosis with radiculopathy, lumbar region     Plan: Since patient continues have ongoing issues that have failed conservative treatment I will repeat lumbar MRI scan to compare to the study that was done April 2022.  Follow with Dr. Lorin Mercy in 4 weeks for recheck and to discuss treatment options.  Follow-Up Instructions: Return in about 4 weeks (around 04/22/2022) for WITH DR YATES TO REVIEW LUMBAR MRI SCAN.   Orders:  No orders of the defined types were placed in this encounter.  No orders of the defined types were placed in this encounter.     Procedures: No procedures performed   Clinical Data: No additional findings.   Subjective: Chief Complaint  Patient presents with   Lower Back - Pain    HPI 61 year old black female comes in today with complaints of back pain and bilateral lower extremity radiculopathy.  This been an ongoing chronic problem.  She has previously been evaluated by Dr.XU and had ESI's with Dr. Ernestina Patches 2019.  She was also previously established with Dr. Crist Fat V pain management and had lumbar ESI's with him as well.  She no longer sees him.  Most recent lumbar MRI scan was done.  February 26, 2021 and that showed:  CLINICAL DATA:  Back pain and bilateral leg and hip pain.   EXAM: MRI LUMBAR SPINE WITHOUT CONTRAST   TECHNIQUE: Multiplanar, multisequence MR imaging of the lumbar spine was performed. No intravenous contrast was administered.   COMPARISON:  August 12, 21.   FINDINGS: Segmentation: Standard segmentation is assumed. The inferior-most fully formed intervertebral disc is labeled L5-S1. This is consistent with prior numbering.   Alignment:   Similar 4 mm of grade 1 anterolisthesis of L4 on L5.   Vertebrae: Vertebral body heights are maintained. No specific evidence of acute fracture, discitis/osteomyelitis, or suspicious bone lesion.   Conus medullaris and cauda equina: Conus extends to the L1 level. Conus appears normal.   Paraspinal and other soft tissues: Unremarkable.   Disc levels:   T12-L1: No significant disc protrusion, foraminal stenosis, or canal stenosis.   L1-L2: No significant disc protrusion, foraminal stenosis, or canal stenosis.   L2-L3: No significant disc protrusion, foraminal stenosis, or canal stenosis.   L3-L4: Mild disc desiccation and height loss. No substantial change in bilateral facet arthropathy/hypertrophy with mild disc bulging. Similar mild right foraminal stenosis.   L4-L5: Mild disc desiccation and height loss. Similar 4 mm of grade 1 anterolisthesis with uncovering of the disc and slight superimposed disc bulging. No substantial change and bilateral facet hypertrophy/arthropathy. Similar mild right and moderate left foraminal stenosis.   L5-S1: Similar mild disc bulging and left greater than right facet hypertrophy/arthropathy. Similar mild proximal left foraminal stenosis.   IMPRESSION: 1. Similar lower lumbar facet hypertrophy/arthropathy and grade 1 anterolisthesis of L4 on L5. 2. Similar resulting moderate left and mild right foraminal stenosis at L4-L5. 3. Similar mild foraminal stenosis on the right at L3-L4 and the left proximally at L5-S1.     Electronically Signed   By: Albertina Parr  Adah Salvage MD   On: 02/27/2021 08:33   Primary care provider Dr. [name] is also been treating patient with medication management without improvement.  Review of Systems No current cardiopulmonary GI/GU  Objective: Vital Signs: LMP 05/17/2012   Physical Exam Constitutional:      Appearance: She is obese.  HENT:     Nose: Nose normal.  Eyes:     Extraocular Movements:  Extraocular movements intact.  Pulmonary:     Effort: No respiratory distress.  Musculoskeletal:     Comments: Positive bilateral lumbar paraspinal tenderness/spasm.  Negative log roll bilateral hips.  Positive left straight leg raise.  Tender.  No Focal Motor Deficits.  Neurological:     Mental Status: She is alert and oriented to person, place, and time.  Psychiatric:        Mood and Affect: Mood normal.    Ortho Exam  Specialty Comments:  No specialty comments available.  Imaging: No results found.   PMFS History: Patient Active Problem List   Diagnosis Date Noted   Osteoarthritis of multiple joints 12/29/2021   Transient vision disturbance 12/29/2021   Screening for cervical cancer 10/23/2021   Need for shingles vaccine 10/23/2021   Encounter for general adult medical examination with abnormal findings 10/22/2021   Chronic painful diabetic neuropathy ([place]) 09/21/2021   Prolonged Q-T interval on ECG 10/18/2019   Type 2 diabetes mellitus with obesity ([place]) 07/11/2019   Intrinsic eczema 07/04/2019   CKD (chronic kidney disease) stage 3, GFR 30-59 ml/min (HCC) 10/11/2018   Class 3 severe obesity due to excess calories with serious comorbidity and body mass index (BMI) of 40.0 to 44.9 in adult ([place]) 09/14/2018   Chronic right-sided low back pain with right-sided sciatica 09/14/2018   Hyperlipidemia with target LDL less than 130 07/05/2018   Mild intermittent asthma without complication 38/25/0539   Snoring 12/14/2016   Essential hypertension 12/14/2016   Gastroesophageal reflux disease with esophagitis 08/26/2016   Dysphagia 08/26/2016   Spondylolisthesis of lumbar region 05/28/2016   Fibromyalgia 08/08/2015   Lumbar facet arthropathy 08/08/2015   Primary osteoarthritis of both knees 08/25/2014   Encounter for screening mammogram for breast cancer 06/01/2013   Abnormal electrocardiogram 02/05/2010   ALLERGIC RHINITIS 01/26/2010   Past Medical History:  Diagnosis Date    Allergy    rhinitis   Anemia    Arthritis    Asthma    Diabetes mellitus without complication ([place])    JQBHALPF(790.2)    History of stomach ulcers    Hypertension    Internal hemorrhoids     Family History  Problem Relation Age of Onset   Arthritis Other    Diabetes Other    Hypertension Other    Breast cancer Other    Hypertension Mother    Diabetes Sister    Hypertension Sister    Breast cancer Daughter    Cancer Neg Hx    Early death Neg Hx    Hearing loss Neg Hx    Heart disease Neg Hx    Hyperlipidemia Neg Hx    Kidney disease Neg Hx    Stroke Neg Hx    Colon cancer Neg Hx     Past Surgical History:  Procedure Laterality Date   CARPAL TUNNEL RELEASE Right    TUBAL LIGATION     Social History   Occupational History   Occupation: disabilty  Tobacco Use   Smoking status: Never    Passive exposure: Yes   Smokeless tobacco: Never  Tobacco comments:    husband smokes outside and inside the home  Vaping Use   Vaping Use: Never used  Substance and Sexual Activity   Alcohol use: No    Alcohol/week: 0.0 standard drinks   Drug use: No   Sexual activity: Yes    Birth control/protection: Post-menopausal, Surgical

## 2022-03-26 ENCOUNTER — Telehealth: Payer: Self-pay

## 2022-03-26 NOTE — Chronic Care Management (AMB) (Signed)
Chronic Care Management Pharmacy Assistant   Name: [name]  MRN: [id number] DOB: 1961-06-27    Reason for Encounter: Disease State-Adherence     Recent office visits:  03/25/22 [name], PA-C (Lower back pain) Orders placed:  MR Lumbar Spine w/o contrast; Medication changes: none  02/05/22 [name], NP-C-Family Medicine (Chronic pain syndrome) No orders or medication changes  10/22/21 [name], MD-PCP (Annual Exam) Blood work orderd: BMP, CBC, Hepatic function panel, Hemg a1c, urinalysis, TSH, Microalbumin,lipid panel; Medication changes: Finerenone (KERENDIA) 10 MG TABS, HYDROcodone-acetaminophen (NORCO/VICODIN) 5-325 MG tablet, L-Methylfolate-Algae-B12-B6 (METANX) 3-90.314-2-35 MG CAPS, Zoster Vaccine Adjuvanted [facility]) injection  Recent consult visits:  02/22/22 Constant, Peggy, MD-Obstetrics and Gynecology (Healthy female exam) Orders placed:   Cytology - PAP; Medication changes: none  12/29/21 Dohmeier, Asencion Partridge, MD-Neurology (Osteoarthritis of multiple joints) No orders or medication changes  Hospital visits:  Medication Reconciliation was completed by comparing discharge summary, patient's EMR and Pharmacy list, and upon discussion with patient.  Admitted to the hospital on 12/22/21 due to Blurred vision. Discharge date was 12/22/21. Discharged from [facility] ED   Medications that remain the same after Hospital Discharge:??  -All other medications will remain the same.    Medications: Outpatient Encounter Medications as of 03/26/2022  Medication Sig   Accu-Chek FastClix Lancets MISC USE 1  TO CHECK GLUCOSE TWICE DAILY   albuterol (PROVENTIL HFA;VENTOLIN HFA) 108 (90 Base) MCG/ACT inhaler Inhale 2 puffs into the lungs every 4 (four) hours as needed for wheezing or shortness of breath (cough, shortness of breath or wheezing.). (Patient not taking: Reported on 02/22/2022)   blood glucose meter kit and supplies KIT Use to test blood  sugar twice daily. DX: E11.8   budesonide-formoterol (SYMBICORT) 80-4.5 MCG/ACT inhaler Inhale 2 puffs into the lungs 2 (two) times daily as needed (shortness of breath, wheezing). Via [place] [employer] [place] patient assistance   Cholecalciferol (VITAMIN D3) 25 MCG (1000 UT) CAPS Take 1 capsule (1,000 Units total) by mouth daily. (Patient not taking: Reported on 02/22/2022)   glucose blood (ACCU-CHEK GUIDE) test strip Use as instructed twice daily.   HYDROcodone-acetaminophen (NORCO/VICODIN) 5-325 MG tablet Take 1 tablet by mouth every 6 (six) hours as needed for severe pain.   L-Methylfolate-Algae-B12-B6 (METANX) 3-90.314-2-35 MG CAPS Take 1 capsule by mouth 2 (two) times daily.   levocetirizine (XYZAL) 5 MG tablet TAKE 1 TABLET BY MOUTH ONCE DAILY IN THE EVENING   potassium chloride (KLOR-CON) 10 MEQ tablet Take 1 tablet by mouth twice daily   pregabalin (LYRICA) 50 MG capsule Take 1 capsule (50 mg total) by mouth at bedtime.   rosuvastatin (CRESTOR) 20 MG tablet Take 1 tablet by mouth once daily   tiZANidine (ZANAFLEX) 4 MG tablet TAKE 1 TABLET BY MOUTH ONCE DAILY AS NEEDED FOR MUSCLE SPASM (Patient not taking: Reported on 02/22/2022)   triamcinolone cream (KENALOG) 0.5 % Apply 1 application topically 3 (three) times daily.   triamterene-hydrochlorothiazide (DYAZIDE) 37.5-25 MG capsule Take 1 each (1 capsule total) by mouth daily.   vitamin B-12 (CYANOCOBALAMIN) 1000 MCG tablet Take 1 tablet (1,000 mcg total) by mouth daily.   No facility-administered encounter medications on file as of 03/26/2022.   Contacted [name] for General Review Call   Chart Review:  Have there been any documented new, changed, or discontinued medications since last visit? No (If yes, include name, dose, frequency, date) Has there been any documented recent hospitalizations or ED visits since last visit with  Clinical Pharmacist? Yes Brief Summary (including medication and/or Diagnosis changes):Admitted to the hospital on  12/22/21 due to Blurred vision. Discharge date was 12/22/21. Discharged from [facility] ED    Adherence Review:  Does the Clinical Pharmacist Assistant have access to adherence rates? Yes Adherence rates for STAR metric medications (List medication(s)/day supply/ last 2 fill dates). Adherence rates for medications indicated for disease state being reviewed (List medication(s)/day supply/ last 2 fill dates). Does the patient have >5 day gap between last estimated fill dates for any of the above medications or other medication gaps? No Reason for medication gaps.   Disease State Questions:  Able to connect with Patient? Yes  Did patient have any problems with their health recently? No Note problems and Concerns:  Have you had any admissions or emergency room visits or worsening of your condition(s) since last visit? Yes Details of ED visit, hospital visit and/or worsening condition(s):Patient seen at ED for blurred vision in February  Have you had any visits with new specialists or providers since your last visit? No Explain:  Have you had any new health care problem(s) since your last visit? No New problem(s) reported:  Have you run out of any of your medications since you last spoke with clinical pharmacist? No What caused you to run out of your medications?  Are there any medications you are not taking as prescribed? No What kept you from taking your medications as prescribed?  Are you having any issues or side effects with your medications? No Note of issues or side effects:  Do you have any other health concerns or questions you want to discuss with your Clinical Pharmacist before your next visit? No Note additional concerns and questions from Patient.  Are there any health concerns that you feel we can do a better job addressing? No Note Patient's response.  Are you having any problems with any of the following since the last visit: (select all that  apply)  None  Details:  12. Any falls since last visit? No  Details:  13. Any increased or uncontrolled pain since last visit? Yes  Details:Patient states that she has back pain and will be having an MRI   14. Next visit Type: None, pt declined appt       15. Additional Details? No    Care Gaps: Colonoscopy-01/30/16 Diabetic Foot Exam-04/16/21 Mammogram-NA Ophthalmology-NA Dexa Scan - NA Annual Well Visit - NA Micro albumin-10/22/22 Hemoglobin A1c- 10/22/21  Star Rating Drugs: Rosuvastatin 20 mg-last fill 12/23/21 90 ds  Ethelene Hal Clinical Pharmacist Assistant [phone]

## 2022-04-01 ENCOUNTER — Other Ambulatory Visit: Payer: Self-pay | Admitting: Internal Medicine

## 2022-04-01 ENCOUNTER — Telehealth: Payer: Self-pay

## 2022-04-01 DIAGNOSIS — M170 Bilateral primary osteoarthritis of knee: Secondary | ICD-10-CM

## 2022-04-01 DIAGNOSIS — E1140 Type 2 diabetes mellitus with diabetic neuropathy, unspecified: Secondary | ICD-10-CM

## 2022-04-01 DIAGNOSIS — G8929 Other chronic pain: Secondary | ICD-10-CM

## 2022-04-01 DIAGNOSIS — E785 Hyperlipidemia, unspecified: Secondary | ICD-10-CM

## 2022-04-01 MED ORDER — HYDROCODONE-ACETAMINOPHEN 5-325 MG PO TABS: 1.0000 | ORAL_TABLET | Freq: Four times a day (QID) | ORAL | 0 refills | Status: DC | PRN

## 2022-04-01 MED ORDER — ROSUVASTATIN CALCIUM 20 MG PO TABS: 20.0000 mg | ORAL_TABLET | Freq: Every day | ORAL | 0 refills | Status: DC

## 2022-04-01 NOTE — Telephone Encounter (Signed)
Pt is calling requesting refill on: HYDROcodone-acetaminophen (NORCO/VICODIN) 5-325 MG tablet rosuvastatin (CRESTOR) 20 MG tablet  Pharmacy: [id number] - [place], [place] AT [facility] OF [address]  LOV 10/22/21 ROV 04/29/22

## 2022-04-17 ENCOUNTER — Other Ambulatory Visit: Payer: Self-pay | Admitting: Internal Medicine

## 2022-04-17 DIAGNOSIS — L2084 Intrinsic (allergic) eczema: Secondary | ICD-10-CM

## 2022-04-17 DIAGNOSIS — H6982 Other specified disorders of Eustachian tube, left ear: Secondary | ICD-10-CM

## 2022-04-20 ENCOUNTER — Ambulatory Visit
Admission: RE | Admit: 2022-04-20 | Discharge: 2022-04-20 | Disposition: A | Discharge status: Home / Self Care | Payer: Medicare Other | Source: Ambulatory Visit | Attending: Surgery | Admitting: Surgery

## 2022-04-20 ENCOUNTER — Other Ambulatory Visit: Payer: Self-pay | Admitting: Internal Medicine

## 2022-04-20 DIAGNOSIS — I10 Essential (primary) hypertension: Secondary | ICD-10-CM

## 2022-04-20 DIAGNOSIS — E876 Hypokalemia: Secondary | ICD-10-CM

## 2022-04-20 DIAGNOSIS — M545 Low back pain, unspecified: Secondary | ICD-10-CM | POA: Diagnosis not present

## 2022-04-20 DIAGNOSIS — M4726 Other spondylosis with radiculopathy, lumbar region: Secondary | ICD-10-CM

## 2022-04-20 DIAGNOSIS — M4316 Spondylolisthesis, lumbar region: Secondary | ICD-10-CM | POA: Diagnosis not present

## 2022-04-27 ENCOUNTER — Ambulatory Visit: Payer: Medicare Other | Admitting: Orthopaedic Surgery

## 2022-04-27 VITALS — BP 142/90 | HR 75 | Ht 61.0 in | Wt 248.0 lb

## 2022-04-27 DIAGNOSIS — M4316 Spondylolisthesis, lumbar region: Secondary | ICD-10-CM

## 2022-04-27 DIAGNOSIS — Z6841 Body Mass Index (BMI) 40.0 and over, adult: Secondary | ICD-10-CM | POA: Diagnosis not present

## 2022-04-27 DIAGNOSIS — M9973 Connective tissue and disc stenosis of intervertebral foramina of lumbar region: Secondary | ICD-10-CM

## 2022-04-27 NOTE — Progress Notes (Signed)
Office Visit Note   Patient: [name]           Date of Birth: 03-20-1961           MRN: [id number] Visit Date: 04/27/2022              Requested by: Crystal Grandchild, MD [address] [place],  [place] [address] PCP: Crystal Grandchild, MD   Assessment & Plan: Visit Diagnoses: No diagnosis found.  Plan: Reviewed MRI scan which shows facet arthropathy soft tissue edema.  Some anterolisthesis at L4-5.  We will make referral to [facility] loss [facility].  She has some anterolisthesis with moderate foraminal stenosis and may require decompression and fusion at some point and needs to work on getting her BMI below 40.  Weight loss center referral made.  Recheck 6 months.  Follow-Up Instructions: No follow-ups on file.   Orders:  No orders of the defined types were placed in this encounter.  No orders of the defined types were placed in this encounter.     Procedures: No procedures performed   Clinical Data: No additional findings.   Subjective: Chief Complaint  Patient presents with   Lower Back - Follow-up    HPI 61 year old female used to work at [employer] does not work last 6 7 years is here with chronic low back pain.  She has bilateral lower extremity radicular symptoms pain in her feet.  She has had previous epidurals in the past when she saw CrystalXu and had injections with [name] 2019.  New MRI scan 04/21/2021 available for comparison to 02/27/2021 images.  Patient's had longstanding problems with BMI elevated currently 46.  Review of Systems positive for stage III kidney disease chronic back pain asthma problems hyperlipidemia type 2 diabetes.   Objective: Vital Signs: BP (!) 142/90   Pulse 75   LMP 05/17/2012   Physical Exam Constitutional:      Appearance: She is well-developed.  HENT:     Head: Normocephalic.     Right Ear: External ear normal.     Left Ear: External ear normal. There is no impacted cerumen.  Eyes:     Pupils: Pupils are  equal, round, and reactive to light.  Neck:     Thyroid: No thyromegaly.     Trachea: No tracheal deviation.  Cardiovascular:     Rate and Rhythm: Normal rate.  Pulmonary:     Effort: Pulmonary effort is normal.  Abdominal:     Palpations: Abdomen is soft.  Musculoskeletal:     Cervical back: No rigidity.  Skin:    General: Skin is warm and dry.  Neurological:     Mental Status: She is alert and oriented to person, place, and time.  Psychiatric:        Behavior: Behavior normal.     Ortho Exam anterior tib EHL is intact.  Negative logroll the hips.  Negative popliteal compression test.  Specialty Comments:  No specialty comments available.  Imaging: Narrative & Impression  CLINICAL DATA:  Chronic low back pain and lower extremity radiculopathy bilaterally   EXAM: MRI LUMBAR SPINE WITHOUT CONTRAST   TECHNIQUE: Multiplanar, multisequence MR imaging of the lumbar spine was performed. No intravenous contrast was administered.   COMPARISON:  Lumbar spine MRI 02/26/2021   FINDINGS: Segmentation: Standard; the lowest formed disc space is designated L5-S1.   Alignment: 4 mm grade 1 anterolisthesis of L4 on L5 is unchanged. Alignment is otherwise normal.  Vertebrae: Vertebral body heights are preserved. Marrow signal is normal. There is no suspicious marrow signal abnormality or marrow edema.   Conus medullaris and cauda equina: Conus extends to the L1 level. Conus and cauda equina appear normal.   Paraspinal and other soft tissues: There is trace perifacetal soft tissue edema bilaterally at L3-L4, minimally increased since the prior study. The soft tissues are otherwise unremarkable.   Disc levels:   T12-L1: No significant spinal canal or neural foraminal stenosis   L1-L2: No significant spinal canal or neural foraminal stenosis   L2-L3: There is a mild left foraminal disc protrusion and mild facet arthropathy resulting in mild left and no significant right  neural foraminal stenosis and no significant spinal canal stenosis, not significantly changed.   L3-L4: There is a mild disc bulge and bilateral facet arthropathy resulting in mild bilateral neural foraminal stenosis without significant spinal canal stenosis, not significantly changed   L4-L5: There is disc desiccation and narrowing with a mild bulge, grade 1 anterolisthesis, and advanced facet arthropathy resulting in moderate left and mild right neural foraminal stenosis without significant spinal canal stenosis, not significantly changed.   L5-S1: Mild facet arthropathy with no significant spinal canal or neural foraminal stenosis.   IMPRESSION: 1. Bilateral facet arthropathy with mild perifacetal soft tissue edema at L3-L4 which may reflect a source of pain, slightly increased since 2022. Mild bilateral neural foraminal stenosis at this level is not significantly changed. 2. Grade 1 anterolisthesis of L4 on L5 with associated advanced facet arthropathy resulting in moderate left and mild right neural foraminal stenosis, not significantly changed. 3. Otherwise, overall no significant interval change since 02/26/2021 with no other significant spinal canal or neural foraminal stenosis.     Electronically Signed   By: [name] M.D.   On: 04/21/2022 08:21      PMFS History: Patient Active Problem List   Diagnosis Date Noted   Osteoarthritis of multiple joints 12/29/2021   Transient vision disturbance 12/29/2021   Screening for cervical cancer 10/23/2021   Need for shingles vaccine 10/23/2021   Encounter for general adult medical examination with abnormal findings 10/22/2021   Chronic painful diabetic neuropathy (HCC) 09/21/2021   Prolonged Q-T interval on ECG 10/18/2019   Type 2 diabetes mellitus with obesity (HCC) 07/11/2019   Intrinsic eczema 07/04/2019   CKD (chronic kidney disease) stage 3, GFR 30-59 ml/min (HCC) 10/11/2018   Class 3 severe obesity due to  excess calories with serious comorbidity and body mass index (BMI) of 40.0 to 44.9 in adult ([facility]CC) 09/14/2018   Chronic right-sided low back pain with right-sided sciatica 09/14/2018   Hyperlipidemia with target LDL less than 130 07/05/2018   Mild intermittent asthma without complication 07/05/2018   Snoring 12/14/2016   Essential hypertension 12/14/2016   Gastroesophageal reflux disease with esophagitis 08/26/2016   Dysphagia 08/26/2016   Spondylolisthesis of lumbar region 05/28/2016   Fibromyalgia 08/08/2015   Lumbar facet arthropathy 08/08/2015   Primary osteoarthritis of both knees 08/25/2014   Encounter for screening mammogram for breast cancer 06/01/2013   Abnormal electrocardiogram 02/05/2010   ALLERGIC RHINITIS 01/26/2010   Past Medical History:  Diagnosis Date   Allergy    rhinitis   Anemia    Arthritis    Asthma    Diabetes mellitus without complication (HCC)    Headache(784.0)    History of stomach ulcers    Hypertension    Internal hemorrhoids     Family History  Problem Relation Age of Onset  Arthritis Other    Diabetes Other    Hypertension Other    Breast cancer Other    Hypertension Mother    Diabetes Sister    Hypertension Sister    Breast cancer Daughter    Cancer Neg Hx    Early death Neg Hx    Hearing loss Neg Hx    Heart disease Neg Hx    Hyperlipidemia Neg Hx    Kidney disease Neg Hx    Stroke Neg Hx    Colon cancer Neg Hx     Past Surgical History:  Procedure Laterality Date   CARPAL TUNNEL RELEASE Right    TUBAL LIGATION     Social History   Occupational History   Occupation: disabilty  Tobacco Use   Smoking status: Never    Passive exposure: Yes   Smokeless tobacco: Never   Tobacco comments:    husband smokes outside and inside the home  Vaping Use   Vaping Use: Never used  Substance and Sexual Activity   Alcohol use: No    Alcohol/week: 0.0 standard drinks of alcohol   Drug use: No   Sexual activity: Yes    Birth  control/protection: Post-menopausal, Surgical

## 2022-04-29 ENCOUNTER — Ambulatory Visit: Payer: Medicare Other | Admitting: Internal Medicine

## 2022-04-29 ENCOUNTER — Telehealth: Payer: Self-pay | Admitting: Internal Medicine

## 2022-04-29 ENCOUNTER — Other Ambulatory Visit: Payer: Self-pay | Admitting: Internal Medicine

## 2022-04-29 DIAGNOSIS — M170 Bilateral primary osteoarthritis of knee: Secondary | ICD-10-CM

## 2022-04-29 DIAGNOSIS — E1140 Type 2 diabetes mellitus with diabetic neuropathy, unspecified: Secondary | ICD-10-CM

## 2022-04-29 DIAGNOSIS — G8929 Other chronic pain: Secondary | ICD-10-CM

## 2022-04-29 MED ORDER — HYDROCODONE-ACETAMINOPHEN 5-325 MG PO TABS: 1.0000 | ORAL_TABLET | Freq: Four times a day (QID) | ORAL | 0 refills | Status: DC | PRN

## 2022-04-29 NOTE — Telephone Encounter (Signed)
Caller & Relationship to patient:Self   Call back DPOEUM:[phone]   Date of last office visit:04/27/2022   Date of next office visit:05/12/2022   Medication(s) to be refilled:  HYDROcodone-acetaminophen (NORCO/VICODIN) 5-325 MG tablet    Preferred Pharmacy: [place] ([place]), [place] - [address] Phone:  [phone]  Fax:  [phone]

## 2022-05-12 ENCOUNTER — Ambulatory Visit: Payer: Medicare Other | Admitting: Internal Medicine

## 2022-05-20 LAB — HM DIABETES EYE EXAM

## 2022-05-24 ENCOUNTER — Other Ambulatory Visit: Payer: Self-pay | Admitting: Family Medicine

## 2022-05-24 ENCOUNTER — Other Ambulatory Visit: Payer: Self-pay | Admitting: Internal Medicine

## 2022-05-24 ENCOUNTER — Telehealth: Payer: Self-pay | Admitting: Internal Medicine

## 2022-05-24 DIAGNOSIS — T50905A Adverse effect of unspecified drugs, medicaments and biological substances, initial encounter: Secondary | ICD-10-CM

## 2022-05-24 DIAGNOSIS — I10 Essential (primary) hypertension: Secondary | ICD-10-CM

## 2022-05-24 DIAGNOSIS — J4520 Mild intermittent asthma, uncomplicated: Secondary | ICD-10-CM

## 2022-05-24 NOTE — Telephone Encounter (Signed)
Note not needed 

## 2022-05-24 NOTE — Telephone Encounter (Signed)
Pt is requesting samples until she can afford her refills at the end of the month  Caller & Relationship to patient:Self    Call back [phone]   Date of last office visit: 10-22-2021  Date of next office visit:06/03/2022-had an appt had to reschedule 04/29/2022 per provider   Medication(s) to be refilled:budesonide-formoterol [facility]) 80-4.5 MCG/ACT inhaler        Preferred Pharmacy:  [place] ([place]), [place] - [address] Phone:  [phone]  Fax:  [phone]

## 2022-05-26 ENCOUNTER — Other Ambulatory Visit: Payer: Self-pay | Admitting: Internal Medicine

## 2022-05-26 ENCOUNTER — Telehealth: Payer: Self-pay | Admitting: Internal Medicine

## 2022-05-26 DIAGNOSIS — J4520 Mild intermittent asthma, uncomplicated: Secondary | ICD-10-CM

## 2022-05-26 MED ORDER — BUDESONIDE-FORMOTEROL FUMARATE 80-4.5 MCG/ACT IN AERO: 2.0000 | INHALATION_SPRAY | Freq: Two times a day (BID) | RESPIRATORY_TRACT | 1 refills | Status: DC | PRN

## 2022-05-26 MED ORDER — TRELEGY ELLIPTA 200-62.5-25 MCG/ACT IN AEPB: 1.0000 | INHALATION_SPRAY | Freq: Every day | RESPIRATORY_TRACT | 1 refills | Status: DC

## 2022-05-26 NOTE — Telephone Encounter (Signed)
Caller & Relationship to patient: [name]  Call back number: [phone]  Date of last office visit: 10/22/22  Date of next office visit: 06/03/22  Medication(s) to be refilled:  budesonide-formoterol [facility]) 80-4.5 MCG/ACT inhaler   Preferred Pharmacy:  [place] ([place]), [place] - [address] Phone:  [phone]  Fax:  [phone]

## 2022-05-26 NOTE — Telephone Encounter (Signed)
Pt stated she went to pharmacy to pick up rx and she said the price of the medication was $141. Pt stated the medication used to cost her $71. Pt cannot afford that and she would like to know what can be prescribed that is more cost effective for her.    Please advise

## 2022-05-31 ENCOUNTER — Ambulatory Visit: Payer: Medicare Other

## 2022-06-02 ENCOUNTER — Telehealth: Payer: Self-pay | Admitting: Internal Medicine

## 2022-06-02 ENCOUNTER — Other Ambulatory Visit: Payer: Self-pay | Admitting: Internal Medicine

## 2022-06-02 DIAGNOSIS — E1140 Type 2 diabetes mellitus with diabetic neuropathy, unspecified: Secondary | ICD-10-CM

## 2022-06-02 DIAGNOSIS — G8929 Other chronic pain: Secondary | ICD-10-CM

## 2022-06-02 DIAGNOSIS — M170 Bilateral primary osteoarthritis of knee: Secondary | ICD-10-CM

## 2022-06-02 MED ORDER — HYDROCODONE-ACETAMINOPHEN 5-325 MG PO TABS: 1.0000 | ORAL_TABLET | Freq: Four times a day (QID) | ORAL | 0 refills | Status: DC | PRN

## 2022-06-02 NOTE — Telephone Encounter (Signed)
Pt has an ov scheduled for 06/03/22 at 320pm

## 2022-06-02 NOTE — Telephone Encounter (Signed)
Caller & Relationship to patient: Janifer Gieselman  Call back number: [phone]  Date of last office visit: 10/22/2021  Date of next office visit: 06/03/22  Medication(s) to be refilled:  HYDROcodone-acetaminophen (NORCO/VICODIN) 5-325 MG tablet  Preferred Pharmacy:  [place] ([place]), [place] - [address] Phone:  [phone]  Fax:  [phone]

## 2022-06-03 ENCOUNTER — Ambulatory Visit (INDEPENDENT_AMBULATORY_CARE_PROVIDER_SITE_OTHER): Payer: Medicare Other | Admitting: Internal Medicine

## 2022-06-03 ENCOUNTER — Encounter: Payer: Self-pay | Admitting: Internal Medicine

## 2022-06-03 VITALS — BP 136/92 | HR 77 | Temp 98.1°F | Resp 16 | Ht 61.0 in | Wt 252.1 lb

## 2022-06-03 DIAGNOSIS — Z79891 Long term (current) use of opiate analgesic: Secondary | ICD-10-CM | POA: Diagnosis not present

## 2022-06-03 DIAGNOSIS — I10 Essential (primary) hypertension: Secondary | ICD-10-CM

## 2022-06-03 DIAGNOSIS — M4316 Spondylolisthesis, lumbar region: Secondary | ICD-10-CM | POA: Diagnosis not present

## 2022-06-03 DIAGNOSIS — N183 Chronic kidney disease, stage 3 unspecified: Secondary | ICD-10-CM

## 2022-06-03 DIAGNOSIS — E669 Obesity, unspecified: Secondary | ICD-10-CM

## 2022-06-03 DIAGNOSIS — E1169 Type 2 diabetes mellitus with other specified complication: Secondary | ICD-10-CM

## 2022-06-03 LAB — BASIC METABOLIC PANEL
BUN: 18 mg/dL (ref 6–23)
CO2: 29 mEq/L (ref 19–32)
Calcium: 9.7 mg/dL (ref 8.4–10.5)
Chloride: 103 mEq/L (ref 96–112)
Creatinine, Ser: 1.42 mg/dL — ABNORMAL HIGH (ref 0.40–1.20)
GFR: 40.08 mL/min — ABNORMAL LOW (ref >60.00)
Glucose, Bld: 79 mg/dL (ref 70–99)
Potassium: 3.8 mEq/L (ref 3.5–5.1)
Sodium: 141 mEq/L (ref 135–145)

## 2022-06-03 LAB — URINALYSIS, ROUTINE W REFLEX MICROSCOPIC
Bilirubin Urine: NEGATIVE
Hgb urine dipstick: NEGATIVE
Ketones, ur: NEGATIVE
Leukocytes,Ua: NEGATIVE
Nitrite: NEGATIVE
RBC / HPF: NONE SEEN (ref 0–?)
Specific Gravity, Urine: 1.015 (ref 1.000–1.030)
Total Protein, Urine: NEGATIVE
Urine Glucose: NEGATIVE
Urobilinogen, UA: 1 (ref 0.0–1.0)
pH: 6.5 (ref 5.0–8.0)

## 2022-06-03 LAB — HEMOGLOBIN A1C: Hgb A1c MFr Bld: 6.6 % — ABNORMAL HIGH (ref 4.6–6.5)

## 2022-06-03 MED ORDER — EMPAGLIFLOZIN 10 MG PO TABS: 10.0000 mg | ORAL_TABLET | Freq: Every day | ORAL | 0 refills | Status: DC

## 2022-06-03 NOTE — Progress Notes (Signed)
Subjective:  Patient ID: [name], female    DOB: 1961-02-11  Age: 61 y.o. MRN: [id number]  CC: Back Pain, Hypertension, and Diabetes   HPI [name] presents for f/up -  She is active and denies chest pain, shortness of breath, diaphoresis, or edema.  Outpatient Medications Prior to Visit  Medication Sig Dispense Refill   budesonide-formoterol (SYMBICORT) 80-4.5 MCG/ACT inhaler Inhale 2 puffs into the lungs 2 (two) times daily as needed (shortness of breath, wheezing). 3 each 1   HYDROcodone-acetaminophen (NORCO/VICODIN) 5-325 MG tablet Take 1 tablet by mouth every 6 (six) hours as needed for severe pain. 65 tablet 0   levocetirizine (XYZAL) 5 MG tablet TAKE 1 TABLET BY MOUTH ONCE DAILY IN THE EVENING 90 tablet 0   potassium chloride (KLOR-CON) 10 MEQ tablet Take 1 tablet by mouth twice daily 180 tablet 0   vitamin B-12 (CYANOCOBALAMIN) 1000 MCG tablet Take 1 tablet (1,000 mcg total) by mouth daily. 90 tablet 3   Accu-Chek FastClix Lancets MISC USE 1  TO CHECK GLUCOSE TWICE DAILY (Patient not taking: Reported on 06/03/2022) 102 each 3   albuterol (PROVENTIL HFA;VENTOLIN HFA) 108 (90 Base) MCG/ACT inhaler Inhale 2 puffs into the lungs every 4 (four) hours as needed for wheezing or shortness of breath (cough, shortness of breath or wheezing.). (Patient not taking: Reported on 02/22/2022) 1 Inhaler 1   blood glucose meter kit and supplies KIT Use to test blood sugar twice daily. DX: E11.8 (Patient not taking: Reported on 06/03/2022) 1 each 0   Cholecalciferol (VITAMIN D3) 25 MCG (1000 UT) CAPS Take 1 capsule (1,000 Units total) by mouth daily. (Patient not taking: Reported on 02/22/2022) 90 capsule 3   Fluticasone-Umeclidin-Vilant (TRELEGY ELLIPTA) 200-62.5-25 MCG/ACT AEPB Inhale 1 puff into the lungs daily. (Patient not taking: Reported on 06/03/2022) 120 each 1   glucose blood (ACCU-CHEK GUIDE) test strip Use as instructed twice daily. (Patient not taking: Reported on 06/03/2022) 300  each 1   L-Methylfolate-Algae-B12-B6 (METANX) 3-90.314-2-35 MG CAPS Take 1 capsule by mouth 2 (two) times daily. 180 capsule 3   pregabalin (LYRICA) 50 MG capsule Take 1 capsule (50 mg total) by mouth at bedtime. (Patient not taking: Reported on 06/03/2022) 90 capsule 0   rosuvastatin (CRESTOR) 20 MG tablet Take 1 tablet (20 mg total) by mouth daily. (Patient not taking: Reported on 06/03/2022) 90 tablet 0   tiZANidine (ZANAFLEX) 4 MG tablet TAKE 1 TABLET BY MOUTH ONCE DAILY AS NEEDED FOR MUSCLE SPASM (Patient not taking: Reported on 02/22/2022) 30 tablet 0   triamcinolone cream (KENALOG) 0.5 % Apply 1 application topically 3 (three) times daily. (Patient not taking: Reported on 06/03/2022) 454 g 1   triamterene-hydrochlorothiazide (DYAZIDE) 37.5-25 MG capsule Take 1 capsule by mouth once daily (Patient not taking: Reported on 06/03/2022) 90 capsule 0   No facility-administered medications prior to visit.    ROS Review of Systems  Constitutional: Negative.  Negative for diaphoresis and fatigue.  HENT: Negative.    Eyes: Negative.   Respiratory:  Negative for cough, chest tightness, shortness of breath and wheezing.   Cardiovascular:  Negative for chest pain, palpitations and leg swelling.  Gastrointestinal:  Negative for abdominal pain, constipation, diarrhea, nausea and vomiting.  Endocrine: Negative.   Genitourinary: Negative.  Negative for difficulty urinating and dysuria.  Musculoskeletal:  Positive for back pain. Negative for arthralgias and myalgias.  Neurological:  Negative for dizziness, weakness, light-headedness and headaches.  Hematological:  Negative for adenopathy. Does not bruise/bleed easily.  Psychiatric/Behavioral: Negative.      Objective:  BP (!) 136/92 (BP Location: Right Arm, Patient Position: Sitting, Cuff Size: Large)   Pulse 77   Temp 98.1 F (36.7 C) (Oral)   Resp 16   Ht _0  (1.549 m)   Wt 252 lb 2 oz (114.4 kg)   LMP 05/17/2012   SpO2 98%   BMI 47.64 kg/m    BP Readings from Last 3 Encounters:  06/03/22 (!) 136/92  04/27/22 (!) 142/90  02/22/22 103/71    Wt Readings from Last 3 Encounters:  06/03/22 252 lb 2 oz (114.4 kg)  04/27/22 248 lb (112.5 kg)  02/22/22 248 lb 8 oz (112.7 kg)    Physical Exam Vitals reviewed.  HENT:     Nose: Nose normal.     Mouth/Throat:     Mouth: Mucous membranes are moist.  Eyes:     General: No scleral icterus.    Conjunctiva/sclera: Conjunctivae normal.  Cardiovascular:     Rate and Rhythm: Normal rate and regular rhythm.     Pulses: Normal pulses.     Heart sounds: No murmur heard. Pulmonary:     Effort: Pulmonary effort is normal.     Breath sounds: No stridor. No wheezing, rhonchi or rales.  Abdominal:     General: Abdomen is flat.     Palpations: There is no mass.     Tenderness: There is no abdominal tenderness. There is no guarding.     Hernia: No hernia is present.  Musculoskeletal:        General: Normal range of motion.     Cervical back: Neck supple.     Thoracic back: Normal.     Lumbar back: Normal. No spasms or tenderness. Normal range of motion. Negative right straight leg raise test and negative left straight leg raise test.     Right lower leg: No edema.     Left lower leg: No edema.  Lymphadenopathy:     Cervical: No cervical adenopathy.  Skin:    General: Skin is warm and dry.  Neurological:     General: No focal deficit present.     Mental Status: She is alert. Mental status is at baseline.     Lab Results  Component Value Date   WBC 5.4 12/22/2021   HGB 12.8 12/22/2021   HCT 38.9 12/22/2021   PLT 237 12/22/2021   GLUCOSE 79 06/03/2022   CHOL 120 10/22/2021   TRIG 70.0 10/22/2021   HDL 45.40 10/22/2021   LDLCALC 61 10/22/2021   ALT 18 12/22/2021   AST 23 12/22/2021   NA 141 06/03/2022   K 3.8 06/03/2022   CL 103 06/03/2022   CREATININE 1.42 (H) 06/03/2022   BUN 18 06/03/2022   CO2 29 06/03/2022   TSH 1.49 10/22/2021   HGBA1C 6.6 (H) 06/03/2022    MICROALBUR <0.7 10/22/2021    MR Lumbar Spine w/o contrast  Result Date: 04/21/2022 CLINICAL DATA:  Chronic low back pain and lower extremity radiculopathy bilaterally EXAM: MRI LUMBAR SPINE WITHOUT CONTRAST TECHNIQUE: Multiplanar, multisequence MR imaging of the lumbar spine was performed. No intravenous contrast was administered. COMPARISON:  Lumbar spine MRI 02/26/2021 FINDINGS: Segmentation: Standard; the lowest formed disc space is designated L5-S1. Alignment: 4 mm grade 1 anterolisthesis of L4 on L5 is unchanged. Alignment is otherwise normal. Vertebrae: Vertebral body heights are preserved. Marrow signal is normal. There is no suspicious marrow signal abnormality or marrow edema. Conus medullaris and cauda equina: Conus extends to  the L1 level. Conus and cauda equina appear normal. Paraspinal and other soft tissues: There is trace perifacetal soft tissue edema bilaterally at L3-L4, minimally increased since the prior study. The soft tissues are otherwise unremarkable. Disc levels: T12-L1: No significant spinal canal or neural foraminal stenosis L1-L2: No significant spinal canal or neural foraminal stenosis L2-L3: There is a mild left foraminal disc protrusion and mild facet arthropathy resulting in mild left and no significant right neural foraminal stenosis and no significant spinal canal stenosis, not significantly changed. L3-L4: There is a mild disc bulge and bilateral facet arthropathy resulting in mild bilateral neural foraminal stenosis without significant spinal canal stenosis, not significantly changed L4-L5: There is disc desiccation and narrowing with a mild bulge, grade 1 anterolisthesis, and advanced facet arthropathy resulting in moderate left and mild right neural foraminal stenosis without significant spinal canal stenosis, not significantly changed. L5-S1: Mild facet arthropathy with no significant spinal canal or neural foraminal stenosis. IMPRESSION: 1. Bilateral facet arthropathy  with mild perifacetal soft tissue edema at L3-L4 which may reflect a source of pain, slightly increased since 2022. Mild bilateral neural foraminal stenosis at this level is not significantly changed. 2. Grade 1 anterolisthesis of L4 on L5 with associated advanced facet arthropathy resulting in moderate left and mild right neural foraminal stenosis, not significantly changed. 3. Otherwise, overall no significant interval change since 02/26/2021 with no other significant spinal canal or neural foraminal stenosis. Electronically Signed   By: Valetta Mole M.D.   On: 04/21/2022 08:21    Assessment & Plan:   [name] was seen today for back pain, hypertension and diabetes.  Diagnoses and all orders for this visit:  Essential hypertension- She has not quite achieved her blood pressure goal.  She will improve her lifestyle modifications. -     Basic metabolic panel; Future -     Urinalysis, Routine w reflex microscopic; Future -     Urine drugs of abuse scrn w alc, routine (Ref Lab); Future -     Urine drugs of abuse scrn w alc, routine (Ref Lab) -     Urinalysis, Routine w reflex microscopic -     Basic metabolic panel  Type 2 diabetes mellitus with obesity ([place])- Her blood sugar is adequately well controlled. -     Basic metabolic panel; Future -     Hemoglobin A1c; Future -     Hemoglobin A1c -     Basic metabolic panel -     empagliflozin (JARDIANCE) 10 MG TABS tablet; Take 1 tablet (10 mg total) by mouth daily before breakfast.  Stage 3 chronic kidney disease, unspecified whether stage 3a or 3b CKD ([place])- Will start an SGLT2 inhibitor. -     Basic metabolic panel; Future -     Urinalysis, Routine w reflex microscopic; Future -     Urinalysis, Routine w reflex microscopic -     Basic metabolic panel -     empagliflozin (JARDIANCE) 10 MG TABS tablet; Take 1 tablet (10 mg total) by mouth daily before breakfast.  Spondylolisthesis of lumbar region- Will continue Norco for the  pain.  Long-term current use of opiate analgesic -     DRUG MONITOR, OPIATES,W/CONF, URINE; Future -     Urine drugs of abuse scrn w alc, routine (Ref Lab); Future -     Urine drugs of abuse scrn w alc, routine (Ref Lab) -     DRUG MONITOR, OPIATES,W/CONF, URINE  Other orders -     DM  TEMPLATE   I am having [name] start on empagliflozin. I am also having her maintain her albuterol, blood glucose meter kit and supplies, Accu-Chek FastClix Lancets, Accu-Chek Guide, triamcinolone cream, cyanocobalamin, Vitamin D3, pregabalin, tiZANidine, Metanx, rosuvastatin, levocetirizine, triamterene-hydrochlorothiazide, potassium chloride, budesonide-formoterol, Trelegy Ellipta, and HYDROcodone-acetaminophen.  Meds ordered this encounter  Medications   empagliflozin (JARDIANCE) 10 MG TABS tablet    Sig: Take 1 tablet (10 mg total) by mouth daily before breakfast.    Dispense:  90 tablet    Refill:  0     Follow-up: Return in about 6 months (around 12/04/2022).  [name], MD

## 2022-06-03 NOTE — Patient Instructions (Signed)
Hypertension, Adult High blood pressure (hypertension) is when the force of blood pumping through the arteries is too strong. The arteries are the blood vessels that carry blood from the heart throughout the body. Hypertension forces the heart to work harder to pump blood and may cause arteries to become narrow or stiff. Untreated or uncontrolled hypertension can lead to a heart attack, heart failure, a stroke, kidney disease, and other problems. A blood pressure reading consists of a higher number over a lower number. Ideally, your blood pressure should be below 120/80. The first ("top") number is called the systolic pressure. It is a measure of the pressure in your arteries as your heart beats. The second ("bottom") number is called the diastolic pressure. It is a measure of the pressure in your arteries as the heart relaxes. What are the causes? The exact cause of this condition is not known. There are some conditions that result in high blood pressure. What increases the risk? Certain factors may make you more likely to develop high blood pressure. Some of these risk factors are under your control, including: Smoking. Not getting enough exercise or physical activity. Being overweight. Having too much fat, sugar, calories, or salt (sodium) in your diet. Drinking too much alcohol. Other risk factors include: Having a personal history of heart disease, diabetes, high cholesterol, or kidney disease. Stress. Having a family history of high blood pressure and high cholesterol. Having obstructive sleep apnea. Age. The risk increases with age. What are the signs or symptoms? High blood pressure may not cause symptoms. Very high blood pressure (hypertensive crisis) may cause: Headache. Fast or irregular heartbeats (palpitations). Shortness of breath. Nosebleed. Nausea and vomiting. Vision changes. Severe chest pain, dizziness, and seizures. How is this diagnosed? This condition is diagnosed by  measuring your blood pressure while you are seated, with your arm resting on a flat surface, your legs uncrossed, and your feet flat on the floor. The cuff of the blood pressure monitor will be placed directly against the skin of your upper arm at the level of your heart. Blood pressure should be measured at least twice using the same arm. Certain conditions can cause a difference in blood pressure between your right and left arms. If you have a high blood pressure reading during one visit or you have normal blood pressure with other risk factors, you may be asked to: Return on a different day to have your blood pressure checked again. Monitor your blood pressure at home for 1 week or longer. If you are diagnosed with hypertension, you may have other blood or imaging tests to help your health care provider understand your overall risk for other conditions. How is this treated? This condition is treated by making healthy lifestyle changes, such as eating healthy foods, exercising more, and reducing your alcohol intake. You may be referred for counseling on a healthy diet and physical activity. Your health care provider may prescribe medicine if lifestyle changes are not enough to get your blood pressure under control and if: Your systolic blood pressure is above 130. Your diastolic blood pressure is above 80. Your personal target blood pressure may vary depending on your medical conditions, your age, and other factors. Follow these instructions at home: Eating and drinking  Eat a diet that is high in fiber and potassium, and low in sodium, added sugar, and fat. An example of this eating plan is called the DASH diet. DASH stands for Dietary Approaches to Stop Hypertension. To eat this way: Eat   plenty of fresh fruits and vegetables. Try to fill one half of your plate at each meal with fruits and vegetables. Eat whole grains, such as whole-wheat pasta, brown rice, or whole-grain bread. Fill about one  fourth of your plate with whole grains. Eat or drink low-fat dairy products, such as skim milk or low-fat yogurt. Avoid fatty cuts of meat, processed or cured meats, and poultry with skin. Fill about one fourth of your plate with lean proteins, such as fish, chicken without skin, beans, eggs, or tofu. Avoid pre-made and processed foods. These tend to be higher in sodium, added sugar, and fat. Reduce your daily sodium intake. Many people with hypertension should eat less than 1,500 mg of sodium a day. Do not drink alcohol if: Your health care provider tells you not to drink. You are pregnant, may be pregnant, or are planning to become pregnant. If you drink alcohol: Limit how much you have to: 0-1 drink a day for women. 0-2 drinks a day for men. Know how much alcohol is in your drink. In the [place] one drink equals one 12 oz bottle of beer (355 mL), one 5 oz glass of wine (148 mL), or one 1 oz glass of hard liquor (44 mL). Lifestyle  Work with your health care provider to maintain a healthy body weight or to lose weight. Ask what an ideal weight is for you. Get at least 30 minutes of exercise that causes your heart to beat faster (aerobic exercise) most days of the week. Activities may include walking, swimming, or biking. Include exercise to strengthen your muscles (resistance exercise), such as Pilates or lifting weights, as part of your weekly exercise routine. Try to do these types of exercises for 30 minutes at least 3 days a week. Do not use any products that contain nicotine or tobacco. These products include cigarettes, chewing tobacco, and vaping devices, such as e-cigarettes. If you need help quitting, ask your health care provider. Monitor your blood pressure at home as told by your health care provider. Keep all follow-up visits. This is important. Medicines Take over-the-counter and prescription medicines only as told by your health care provider. Follow directions carefully. Blood  pressure medicines must be taken as prescribed. Do not skip doses of blood pressure medicine. Doing this puts you at risk for problems and can make the medicine less effective. Ask your health care provider about side effects or reactions to medicines that you should watch for. Contact a health care provider if you: Think you are having a reaction to a medicine you are taking. Have headaches that keep coming back (recurring). Feel dizzy. Have swelling in your ankles. Have trouble with your vision. Get help right away if you: Develop a severe headache or confusion. Have unusual weakness or numbness. Feel faint. Have severe pain in your chest or abdomen. Vomit repeatedly. Have trouble breathing. These symptoms may be an emergency. Get help right away. Call 911. Do not wait to see if the symptoms will go away. Do not drive yourself to the hospital. Summary Hypertension is when the force of blood pumping through your arteries is too strong. If this condition is not controlled, it may put you at risk for serious complications. Your personal target blood pressure may vary depending on your medical conditions, your age, and other factors. For most people, a normal blood pressure is less than 120/80. Hypertension is treated with lifestyle changes, medicines, or a combination of both. Lifestyle changes include losing weight, eating a healthy,   low-sodium diet, exercising more, and limiting alcohol. This information is not intended to replace advice given to you by your health care provider. Make sure you discuss any questions you have with your health care provider. Document Revised: 09/01/2021 Document Reviewed: 09/01/2021 Elsevier Patient Education  2023 [employer]  

## 2022-06-04 ENCOUNTER — Ambulatory Visit: Payer: Medicare Other

## 2022-06-06 ENCOUNTER — Encounter: Payer: Self-pay | Admitting: Internal Medicine

## 2022-06-06 LAB — DM TEMPLATE

## 2022-06-06 LAB — DRUG MONITOR, OPIATES,W/CONF, URINE
Codeine: NEGATIVE ng/mL (ref <50)
Hydrocodone: 231 ng/mL — ABNORMAL HIGH (ref <50)
Hydromorphone: 101 ng/mL — ABNORMAL HIGH (ref <50)
Morphine: NEGATIVE ng/mL (ref <50)
Norhydrocodone: 479 ng/mL — ABNORMAL HIGH (ref <50)
Opiates: POSITIVE ng/mL — AB (ref <100)

## 2022-06-07 LAB — URINE DRUGS OF ABUSE SCREEN W ALC, ROUTINE (REF LAB)
Amphetamines, Urine: NEGATIVE ng/mL
Barbiturate Quant, Ur: NEGATIVE ng/mL
Benzodiazepine Quant, Ur: NEGATIVE ng/mL
Cannabinoid Quant, Ur: NEGATIVE ng/mL
Cocaine (Metab.): NEGATIVE ng/mL
Ethanol, Urine: NEGATIVE %
Methadone Screen, Urine: NEGATIVE ng/mL
PCP Quant, Ur: NEGATIVE ng/mL
Propoxyphene: NEGATIVE ng/mL

## 2022-06-07 LAB — OPIATES CONFIRMATION, URINE: OPIATES: NEGATIVE

## 2022-06-18 ENCOUNTER — Ambulatory Visit
Admission: RE | Admit: 2022-06-18 | Discharge: 2022-06-18 | Disposition: A | Discharge status: Home / Self Care | Payer: Medicare Other | Source: Ambulatory Visit | Attending: Obstetrics and Gynecology | Admitting: Obstetrics and Gynecology

## 2022-06-18 DIAGNOSIS — Z01419 Encounter for gynecological examination (general) (routine) without abnormal findings: Secondary | ICD-10-CM

## 2022-06-18 DIAGNOSIS — Z1231 Encounter for screening mammogram for malignant neoplasm of breast: Secondary | ICD-10-CM | POA: Diagnosis not present

## 2022-06-28 ENCOUNTER — Other Ambulatory Visit: Payer: Self-pay | Admitting: Internal Medicine

## 2022-06-28 DIAGNOSIS — L2084 Intrinsic (allergic) eczema: Secondary | ICD-10-CM

## 2022-06-28 DIAGNOSIS — L239 Allergic contact dermatitis, unspecified cause: Secondary | ICD-10-CM

## 2022-07-08 ENCOUNTER — Encounter: Payer: Self-pay | Admitting: Emergency Medicine

## 2022-07-08 ENCOUNTER — Other Ambulatory Visit: Payer: Self-pay | Admitting: Emergency Medicine

## 2022-07-08 ENCOUNTER — Telehealth: Payer: Self-pay | Admitting: Internal Medicine

## 2022-07-08 DIAGNOSIS — E1140 Type 2 diabetes mellitus with diabetic neuropathy, unspecified: Secondary | ICD-10-CM

## 2022-07-08 DIAGNOSIS — G8929 Other chronic pain: Secondary | ICD-10-CM

## 2022-07-08 DIAGNOSIS — M170 Bilateral primary osteoarthritis of knee: Secondary | ICD-10-CM

## 2022-07-08 MED ORDER — HYDROCODONE-ACETAMINOPHEN 5-325 MG PO TABS: 1.0000 | ORAL_TABLET | Freq: Four times a day (QID) | ORAL | 0 refills | Status: DC | PRN

## 2022-07-08 NOTE — Telephone Encounter (Signed)
Patient needs her hydro codone sent to [facility] in [place]

## 2022-07-17 ENCOUNTER — Other Ambulatory Visit: Payer: Self-pay | Admitting: Internal Medicine

## 2022-07-17 DIAGNOSIS — L2084 Intrinsic (allergic) eczema: Secondary | ICD-10-CM

## 2022-07-17 DIAGNOSIS — H6982 Other specified disorders of Eustachian tube, left ear: Secondary | ICD-10-CM

## 2022-07-26 ENCOUNTER — Other Ambulatory Visit: Payer: Self-pay | Admitting: Internal Medicine

## 2022-07-26 ENCOUNTER — Telehealth: Payer: Self-pay | Admitting: Internal Medicine

## 2022-07-26 DIAGNOSIS — M170 Bilateral primary osteoarthritis of knee: Secondary | ICD-10-CM

## 2022-07-26 DIAGNOSIS — E1140 Type 2 diabetes mellitus with diabetic neuropathy, unspecified: Secondary | ICD-10-CM

## 2022-07-26 DIAGNOSIS — G8929 Other chronic pain: Secondary | ICD-10-CM

## 2022-07-26 MED ORDER — HYDROCODONE-ACETAMINOPHEN 5-325 MG PO TABS: 1.0000 | ORAL_TABLET | Freq: Four times a day (QID) | ORAL | 0 refills | Status: DC | PRN

## 2022-07-26 NOTE — Telephone Encounter (Signed)
Patient is requesting refill of HYDROcodone-acetaminophen (NORCO/VICODIN) 5-325 MG tablet be sent to [facility] ([place] [place] - [address]. States that she normally gets 60 pills but only got 30 last time.

## 2022-07-28 ENCOUNTER — Ambulatory Visit: Payer: Medicare Other | Admitting: Internal Medicine

## 2022-07-30 ENCOUNTER — Other Ambulatory Visit: Payer: Self-pay | Admitting: Internal Medicine

## 2022-07-30 DIAGNOSIS — E785 Hyperlipidemia, unspecified: Secondary | ICD-10-CM

## 2022-08-03 ENCOUNTER — Other Ambulatory Visit: Payer: Self-pay | Admitting: Internal Medicine

## 2022-08-03 DIAGNOSIS — E876 Hypokalemia: Secondary | ICD-10-CM

## 2022-08-03 DIAGNOSIS — I10 Essential (primary) hypertension: Secondary | ICD-10-CM

## 2022-08-24 ENCOUNTER — Telehealth: Payer: Self-pay | Admitting: Internal Medicine

## 2022-08-24 NOTE — Telephone Encounter (Signed)
Patient needs her hydrocodone refilled - please send to [place] at [facility]

## 2022-08-25 ENCOUNTER — Other Ambulatory Visit: Payer: Self-pay | Admitting: Internal Medicine

## 2022-08-25 ENCOUNTER — Encounter (INDEPENDENT_AMBULATORY_CARE_PROVIDER_SITE_OTHER): Payer: Medicare Other | Admitting: Internal Medicine

## 2022-08-25 DIAGNOSIS — M170 Bilateral primary osteoarthritis of knee: Secondary | ICD-10-CM

## 2022-08-25 DIAGNOSIS — G8929 Other chronic pain: Secondary | ICD-10-CM

## 2022-08-25 DIAGNOSIS — E1140 Type 2 diabetes mellitus with diabetic neuropathy, unspecified: Secondary | ICD-10-CM

## 2022-08-25 MED ORDER — HYDROCODONE-ACETAMINOPHEN 5-325 MG PO TABS: 1.0000 | ORAL_TABLET | Freq: Four times a day (QID) | ORAL | 0 refills | Status: DC | PRN

## 2022-08-28 ENCOUNTER — Encounter: Payer: Self-pay | Admitting: Emergency Medicine

## 2022-08-28 ENCOUNTER — Ambulatory Visit
Admission: EM | Admit: 2022-08-28 | Discharge: 2022-08-28 | Disposition: A | Discharge status: Home / Self Care | Payer: Medicare Other | Attending: Urgent Care | Admitting: Urgent Care

## 2022-08-28 DIAGNOSIS — M5441 Lumbago with sciatica, right side: Secondary | ICD-10-CM

## 2022-08-28 DIAGNOSIS — G8929 Other chronic pain: Secondary | ICD-10-CM

## 2022-08-28 MED ORDER — LIDOCAINE 5 % EX PTCH: 2.0000 | MEDICATED_PATCH | CUTANEOUS | 0 refills | Status: AC

## 2022-08-28 NOTE — Discharge Instructions (Addendum)
Follow-up with your primary care or orthopedic provider regarding ongoing treatment of your lower back pain.

## 2022-08-28 NOTE — ED Triage Notes (Signed)
Pt is present today with lower back pain. Pt stats he pain started Thursday. Pt states that she has a hx of arthritis in her lower back. Pt states that she took hydrocodone around 6am with no relief

## 2022-08-28 NOTE — ED Provider Notes (Signed)
[name]    CSN: [id number] Arrival date & time: 08/28/22  0900      History   Chief Complaint Chief Complaint  Patient presents with   Back Pain    HPI [name] is a 61 y.o. female.    Back Pain   Patient presents to urgent care with complaint of lower back pain x2 days.  She endorses history of arthritis causing chronic lower back pain with sciatica.  Taking hydrocodone without relief.  Patient with history of DM 2 treated with Jardiance.  Most recent A1c shows well-controlled glucose, A1c equals 6.6.  Chart also shows prescription for Norco prescribed for chronic right-sided lower back pain with right-sided sciatica, osteoarthritis of both knees, diabetic neuropathy, as well as Lyrica which is prescribed for diabetic neuropathy.  Past Medical History:  Diagnosis Date   Allergy    rhinitis   Anemia    Arthritis    Asthma    Diabetes mellitus without complication ([place])    HQRFXJOI(325.4)    History of stomach ulcers    Hypertension    Internal hemorrhoids     Patient Active Problem List   Diagnosis Date Noted   Long-term current use of opiate analgesic 06/03/2022   Osteoarthritis of multiple joints 12/29/2021   Screening for cervical cancer 10/23/2021   Need for shingles vaccine 10/23/2021   Encounter for general adult medical examination with abnormal findings 10/22/2021   Chronic painful diabetic neuropathy ([place]) 09/21/2021   Prolonged Q-T interval on ECG 10/18/2019   Type 2 diabetes mellitus with obesity ([place]) 07/11/2019   Intrinsic eczema 07/04/2019   CKD (chronic kidney disease) stage 3, GFR 30-59 ml/min (HCC) 10/11/2018   Class 3 severe obesity due to excess calories with serious comorbidity and body mass index (BMI) of 40.0 to 44.9 in adult ([place]) 09/14/2018   Chronic right-sided low back pain with right-sided sciatica 09/14/2018   Hyperlipidemia with target LDL less than 130 07/05/2018   Mild intermittent asthma without  complication 98/26/4158   Snoring 12/14/2016   Essential hypertension 12/14/2016   Gastroesophageal reflux disease with esophagitis 08/26/2016   Dysphagia 08/26/2016   Spondylolisthesis of lumbar region 05/28/2016   Fibromyalgia 08/08/2015   Lumbar facet arthropathy 08/08/2015   Primary osteoarthritis of both knees 08/25/2014   Encounter for screening mammogram for breast cancer 06/01/2013   Abnormal electrocardiogram 02/05/2010   ALLERGIC RHINITIS 01/26/2010    Past Surgical History:  Procedure Laterality Date   CARPAL TUNNEL RELEASE Right    TUBAL LIGATION      OB History     Gravida  5   Para  4   Term  4   Preterm  0   AB  1   Living  4      SAB  1   IAB  0   Ectopic  0   Multiple  0   Live Births  4            Home Medications    Prior to Admission medications   Medication Sig Start Date End Date Taking? Authorizing Provider  Accu-Chek FastClix Lancets MISC USE 1  TO CHECK GLUCOSE TWICE DAILY Patient not taking: Reported on 06/03/2022 11/29/19   Crystal Lima, MD  albuterol (PROVENTIL HFA;VENTOLIN HFA) 108 (90 Base) MCG/ACT inhaler Inhale 2 puffs into the lungs every 4 (four) hours as needed for wheezing or shortness of breath (cough, shortness of breath or wheezing.). Patient not taking: Reported on 02/22/2022 03/11/18  Robyn Haber, MD  blood glucose meter kit and supplies KIT Use to test blood sugar twice daily. DX: E11.8 Patient not taking: Reported on 06/03/2022 07/30/19   Crystal Lima, MD  budesonide-formoterol [facility]) 80-4.5 MCG/ACT inhaler Inhale 2 puffs into the lungs 2 (two) times daily as needed (shortness of breath, wheezing). 05/26/22   Crystal Lima, MD  Cholecalciferol (VITAMIN D3) 25 MCG (1000 UT) CAPS Take 1 capsule (1,000 Units total) by mouth daily. Patient not taking: Reported on 02/22/2022 08/28/21   Crystal Rail, MD  empagliflozin (JARDIANCE) 10 MG TABS tablet Take 1 tablet (10 mg total) by mouth daily before  breakfast. 06/03/22   Crystal Lima, MD  Fluticasone-Umeclidin-Vilant (TRELEGY ELLIPTA) 200-62.5-25 MCG/ACT AEPB Inhale 1 puff into the lungs daily. Patient not taking: Reported on 06/03/2022 05/26/22   Crystal Lima, MD  glucose blood (ACCU-CHEK GUIDE) test strip Use as instructed twice daily. Patient not taking: Reported on 06/03/2022 11/29/19   Crystal Lima, MD  HYDROcodone-acetaminophen (NORCO/VICODIN) 5-325 MG tablet Take 1 tablet by mouth every 6 (six) hours as needed for severe pain. 08/25/22   Crystal Lima, MD  L-Methylfolate-Algae-B12-B6 [facility]) 3-90.314-2-35 MG CAPS Take 1 capsule by mouth 2 (two) times daily. 10/23/21   Crystal Lima, MD  levocetirizine (XYZAL) 5 MG tablet TAKE 1 TABLET BY MOUTH ONCE DAILY IN THE EVENING 07/17/22   Crystal Lima, MD  potassium chloride (KLOR-CON) 10 MEQ tablet Take 1 tablet by mouth twice daily 05/24/22   Crystal Lima, MD  pregabalin (LYRICA) 50 MG capsule Take 1 capsule (50 mg total) by mouth at bedtime. Patient not taking: Reported on 06/03/2022 09/21/21   Crystal Lima, MD  rosuvastatin (CRESTOR) 20 MG tablet Take 1 tablet by mouth once daily 07/30/22   Crystal Lima, MD  tiZANidine (ZANAFLEX) 4 MG tablet TAKE 1 TABLET BY MOUTH ONCE DAILY AS NEEDED FOR MUSCLE SPASM Patient not taking: Reported on 02/22/2022 10/19/21   Crystal Lima, MD  triamcinolone cream (KENALOG) 0.5 % APPLY ONE APPLICATION TOPICALLY THREE TIMES DAILY. 06/28/22   Crystal Lima, MD  triamterene-hydrochlorothiazide (DYAZIDE) 37.5-25 MG capsule Take 1 capsule by mouth once daily 08/03/22   Crystal Lima, MD  vitamin B-12 (CYANOCOBALAMIN) 1000 MCG tablet Take 1 tablet (1,000 mcg total) by mouth daily. 08/28/21   Crystal Rail, MD    Family History Family History  Problem Relation Age of Onset   Arthritis Other    Diabetes Other    Hypertension Other    Breast cancer Other    Hypertension Mother    Diabetes Sister    Hypertension Sister    Breast cancer  Daughter    Cancer Neg Hx    Early death Neg Hx    Hearing loss Neg Hx    Heart disease Neg Hx    Hyperlipidemia Neg Hx    Kidney disease Neg Hx    Stroke Neg Hx    Colon cancer Neg Hx     Social History Social History   Tobacco Use   Smoking status: Never    Passive exposure: Yes   Smokeless tobacco: Never   Tobacco comments:    husband smokes outside and inside the home  Vaping Use   Vaping Use: Never used  Substance Use Topics   Alcohol use: No    Alcohol/week: 0.0 standard drinks of alcohol   Drug use: No     Allergies   Verapamil   Review  of Systems Review of Systems  Musculoskeletal:  Positive for back pain.     Physical Exam Triage Vital Signs ED Triage Vitals  Enc Vitals Group     BP 08/28/22 0911 (!) 142/99     Pulse Rate 08/28/22 0911 79     Resp 08/28/22 0911 18     Temp 08/28/22 0911 97.7 F (36.5 C)     Temp src --      SpO2 08/28/22 0911 94 %     Weight --      Height --      Head Circumference --      Peak Flow --      Pain Score 08/28/22 0910 10     Pain Loc --      Pain Edu? --      Excl. in [place]? --    No data found.  Updated Vital Signs BP (!) 142/99   Pulse 79   Temp 97.7 F (36.5 C)   Resp 18   LMP 05/17/2012   SpO2 94%   Visual Acuity Right Eye Distance:   Left Eye Distance:   Bilateral Distance:    Right Eye Near:   Left Eye Near:    Bilateral Near:     Physical Exam Vitals reviewed.  Constitutional:      Appearance: Normal appearance.  Skin:    General: Skin is warm and dry.  Neurological:     General: No focal deficit present.     Mental Status: She is alert and oriented to person, place, and time.  Psychiatric:        Mood and Affect: Mood normal.        Behavior: Behavior normal.      UC Treatments / Results  Labs (all labs ordered are listed, but only abnormal results are displayed) Labs Reviewed - No data to display  EKG   Radiology No results found.  Procedures Procedures (including  critical care time)  Medications Ordered in UC Medications - No data to display  Initial Impression / Assessment and Plan / UC Course  I have reviewed the triage vital signs and the nursing notes.  Pertinent labs & imaging results that were available during my care of the patient were reviewed by me and considered in my medical decision making (see chart for details).   Limited evaluation today.  Patient denies any new injury or precipitating event for her worsening pain today.  MRI conducted 04/21/2022 indicating multiple areas of degeneration as possible sources of her pain.  Patient with prescriptions for hydrocodone (Vicodin) and pregabalin.  Recommended she follow-up with her primary care or orthopedic provider treating her for back pain.  Informed patient that there was little treatment I could provide in urgent care beyond what she is already receiving.  Offered lidocaine patch which she accepted.   Final Clinical Impressions(s) / UC Diagnoses   Final diagnoses:  None   Discharge Instructions   None    ED Prescriptions   None    PDMP not reviewed this encounter.   Rose Phi, [place] 08/28/22 [phone]

## 2022-08-30 ENCOUNTER — Encounter: Payer: Self-pay | Admitting: Internal Medicine

## 2022-08-30 ENCOUNTER — Ambulatory Visit (INDEPENDENT_AMBULATORY_CARE_PROVIDER_SITE_OTHER): Payer: Medicare Other | Admitting: Internal Medicine

## 2022-08-30 ENCOUNTER — Ambulatory Visit (INDEPENDENT_AMBULATORY_CARE_PROVIDER_SITE_OTHER): Payer: Medicare Other

## 2022-08-30 VITALS — BP 130/76 | HR 82 | Temp 98.2°F | Ht 61.0 in | Wt 247.0 lb

## 2022-08-30 DIAGNOSIS — R1013 Epigastric pain: Secondary | ICD-10-CM

## 2022-08-30 DIAGNOSIS — N1831 Chronic kidney disease, stage 3a: Secondary | ICD-10-CM

## 2022-08-30 DIAGNOSIS — I10 Essential (primary) hypertension: Secondary | ICD-10-CM | POA: Diagnosis not present

## 2022-08-30 MED ORDER — SUCRALFATE 1 G PO TABS: 1.0000 g | ORAL_TABLET | Freq: Four times a day (QID) | ORAL | 0 refills | Status: DC

## 2022-08-30 MED ORDER — PANTOPRAZOLE SODIUM 40 MG PO TBEC: 40.0000 mg | DELAYED_RELEASE_TABLET | Freq: Every day | ORAL | 3 refills | Status: DC

## 2022-08-30 NOTE — Assessment & Plan Note (Signed)
1 wk onset mod to severe, with nausea mild intermittent and radiation to the back - etiology unclear, differential includes gastritis, GB and pancreatitis.  For acute abd films with cxr today, labs including lipase, empiric protonix 40 mg qd, carafate 1 gm qid x 1 mo, and f/u PCP.   Consider CT abd/pelvis pending labs, and/or GI referral

## 2022-08-30 NOTE — Progress Notes (Signed)
Patient ID: [name], female   DOB: 1961/01/12, 61 y.o.   MRN: [id number]        Chief Complaint: follow up epigastric pain x 1 wk with nausea       HPI:  [name] is a 61 y.o. female here with c/o 1 wk acute onset constant at least mild to mod epigastric pain with radiation to the back, and worse with foods especially higher fat foods, so that in the past wk she has become fearful of eating.  Has nausea mild intermittent and does not tx for nausea, but no vomiting, fever,.  Denies worsening reflux, dysphagia, other bowel change or blood.  Does not take any NSAIDS or ETOH.  No prior significant GI history.  Denies urinary symptoms such as dysuria, frequency, urgency, flank pain, hematuria or n/v, fever, chills.       Wt Readings from Last 3 Encounters:  08/30/22 247 lb (112 kg)  06/03/22 252 lb 2 oz (114.4 kg)  04/27/22 248 lb (112.5 kg)   BP Readings from Last 3 Encounters:  08/30/22 130/76  08/28/22 (!) 142/99  06/03/22 (!) 136/92         Past Medical History:  Diagnosis Date   Allergy    rhinitis   Anemia    Arthritis    Asthma    Diabetes mellitus without complication (HCC)    YIRSWNIO(270.3)    History of stomach ulcers    Hypertension    Internal hemorrhoids    Past Surgical History:  Procedure Laterality Date   CARPAL TUNNEL RELEASE Right    TUBAL LIGATION      reports that she has never smoked. She has been exposed to tobacco smoke. She has never used smokeless tobacco. She reports that she does not drink alcohol and does not use drugs. family history includes Arthritis in an other family member; Breast cancer in her daughter and another family member; Diabetes in her sister and another family member; Hypertension in her mother, sister, and another family member. Allergies  Allergen Reactions   Verapamil Swelling   Current Outpatient Medications on File Prior to Visit  Medication Sig Dispense Refill   Accu-Chek FastClix Lancets MISC USE 1  TO CHECK  GLUCOSE TWICE DAILY 102 each 3   albuterol (PROVENTIL HFA;VENTOLIN HFA) 108 (90 Base) MCG/ACT inhaler Inhale 2 puffs into the lungs every 4 (four) hours as needed for wheezing or shortness of breath (cough, shortness of breath or wheezing.). 1 Inhaler 1   blood glucose meter kit and supplies KIT Use to test blood sugar twice daily. DX: E11.8 1 each 0   budesonide-formoterol (SYMBICORT) 80-4.5 MCG/ACT inhaler Inhale 2 puffs into the lungs 2 (two) times daily as needed (shortness of breath, wheezing). 3 each 1   Cholecalciferol (VITAMIN D3) 25 MCG (1000 UT) CAPS Take 1 capsule (1,000 Units total) by mouth daily. 90 capsule 3   empagliflozin (JARDIANCE) 10 MG TABS tablet Take 1 tablet (10 mg total) by mouth daily before breakfast. 90 tablet 0   Fluticasone-Umeclidin-Vilant (TRELEGY ELLIPTA) 200-62.5-25 MCG/ACT AEPB Inhale 1 puff into the lungs daily. 120 each 1   glucose blood (ACCU-CHEK GUIDE) test strip Use as instructed twice daily. 300 each 1   HYDROcodone-acetaminophen (NORCO/VICODIN) 5-325 MG tablet Take 1 tablet by mouth every 6 (six) hours as needed for severe pain. 60 tablet 0   L-Methylfolate-Algae-B12-B6 (METANX) 3-90.314-2-35 MG CAPS Take 1 capsule by mouth 2 (two) times daily. 180 capsule 3   levocetirizine (XYZAL)  5 MG tablet TAKE 1 TABLET BY MOUTH ONCE DAILY IN THE EVENING 90 tablet 1   lidocaine (LIDODERM) 5 % Place 2 patches onto the skin daily for 15 days. Remove & Discard patch within 12 hours. 30 patch 0   potassium chloride (KLOR-CON) 10 MEQ tablet Take 1 tablet by mouth twice daily 180 tablet 0   pregabalin (LYRICA) 50 MG capsule Take 1 capsule (50 mg total) by mouth at bedtime. 90 capsule 0   rosuvastatin (CRESTOR) 20 MG tablet Take 1 tablet by mouth once daily 90 tablet 0   tiZANidine (ZANAFLEX) 4 MG tablet TAKE 1 TABLET BY MOUTH ONCE DAILY AS NEEDED FOR MUSCLE SPASM 30 tablet 0   triamcinolone cream (KENALOG) 0.5 % APPLY ONE APPLICATION TOPICALLY THREE TIMES DAILY. 225 g 0    triamterene-hydrochlorothiazide (DYAZIDE) 37.5-25 MG capsule Take 1 capsule by mouth once daily 90 capsule 0   vitamin B-12 (CYANOCOBALAMIN) 1000 MCG tablet Take 1 tablet (1,000 mcg total) by mouth daily. 90 tablet 3   No current facility-administered medications on file prior to visit.        ROS:  All others reviewed and negative.  Objective        PE:  BP 130/76 (BP Location: Right Arm, Patient Position: Sitting, Cuff Size: Large)   Pulse 82   Temp 98.2 F (36.8 C) (Oral)   Ht 5' 1"  (1.549 m)   Wt 247 lb (112 kg)   LMP 05/17/2012   SpO2 95%   BMI 46.67 kg/m                 Constitutional: Pt appears in NAD               HENT: Head: NCAT.                Right Ear: External ear normal.                 Left Ear: External ear normal.                Eyes: . Pupils are equal, round, and reactive to light. Conjunctivae and EOM are normal               Nose: without d/c or deformity               Neck: Neck supple. Gross normal ROM               Cardiovascular: Normal rate and regular rhythm.                 Pulmonary/Chest: Effort normal and breath sounds without rales or wheezing.                Abd:  Soft, marked epigastric tender, ND, + BS, no organomegaly               Neurological: Pt is alert. At baseline orientation, motor grossly intact               Skin: Skin is warm. No rashes, no other new lesions, LE edema - none               Psychiatric: Pt behavior is normal without agitation   Micro: none  Cardiac tracings I have personally interpreted today:  none  Pertinent Radiological findings (summarize): none   Lab Results  Component Value Date   WBC 5.4 12/22/2021   HGB 12.8 12/22/2021   HCT 38.9 12/22/2021   PLT  237 12/22/2021   GLUCOSE 79 06/03/2022   CHOL 120 10/22/2021   TRIG 70.0 10/22/2021   HDL 45.40 10/22/2021   LDLCALC 61 10/22/2021   ALT 18 12/22/2021   AST 23 12/22/2021   NA 141 06/03/2022   K 3.8 06/03/2022   CL 103 06/03/2022   CREATININE 1.42  (H) 06/03/2022   BUN 18 06/03/2022   CO2 29 06/03/2022   TSH 1.49 10/22/2021   HGBA1C 6.6 (H) 06/03/2022   MICROALBUR <0.7 10/22/2021   Assessment/Plan:  [name] is a 61 y.o. Black or African American [2] female with  has a past medical history of Allergy, Anemia, Arthritis, Asthma, Diabetes mellitus without complication ([place]), AREQJEAD(073.5), History of stomach ulcers, Hypertension, and Internal hemorrhoids.  Epigastric pain 1 wk onset mod to severe, with nausea mild intermittent and radiation to the back - etiology unclear, differential includes gastritis, GB and pancreatitis.  For acute abd films with cxr today, labs including lipase, empiric protonix 40 mg qd, carafate 1 gm qid x 1 mo, and f/u PCP.   Consider CT abd/pelvis pending labs, and/or GI referral  Essential hypertension BP Readings from Last 3 Encounters:  08/30/22 130/76  08/28/22 (!) 142/99  06/03/22 (!) 136/92   Stable, pt to continue medical treatment dyazide - 1 qd    CKD (chronic kidney disease) stage 3, GFR 30-59 ml/min (HCC) For f/u lab today, may limit CT contrast if worsening over baseline  Followup: Return if symptoms worsen or fail to improve.  Cathlean Cower, MD 08/30/2022 4:30 PM [facility]

## 2022-08-30 NOTE — Patient Instructions (Signed)
Please take all new medication as prescribed - the protonix for stomach acid, and carafate to help coat the stomach to help it heal  Please continue all other medications as before, and refills have been done if requested.  Please have the pharmacy call with any other refills you may need.  Please continue your efforts at being more active, low cholesterol diet, and weight control  Please keep your appointments with your specialists as you may have planned  Please go to the XRAY Department in the first floor for the x-ray testing  Please go to the LAB at the blood drawing area for the tests to be done  You will be contacted by phone if any changes need to be made immediately.  Otherwise, you will receive a letter about your results with an explanation, but please check with MyChart first.  Please return to see Dr Ronnald Ramp in 1 - 2 wks

## 2022-08-30 NOTE — Assessment & Plan Note (Signed)
BP Readings from Last 3 Encounters:  08/30/22 130/76  08/28/22 (!) 142/99  06/03/22 (!) 136/92   Stable, pt to continue medical treatment dyazide - 1 qd

## 2022-08-30 NOTE — Assessment & Plan Note (Signed)
For f/u lab today, may limit CT contrast if worsening over baseline

## 2022-08-31 LAB — CBC WITH DIFFERENTIAL/PLATELET
Basophils Absolute: 0 10*3/uL (ref 0.0–0.1)
Basophils Relative: 0.6 % (ref 0.0–3.0)
Eosinophils Absolute: 0.3 10*3/uL (ref 0.0–0.7)
Eosinophils Relative: 4.2 % (ref 0.0–5.0)
HCT: 41.2 % (ref 36.0–46.0)
Hemoglobin: 13.2 g/dL (ref 12.0–15.0)
Lymphocytes Relative: 46.6 % — ABNORMAL HIGH (ref 12.0–46.0)
Lymphs Abs: 3.1 10*3/uL (ref 0.7–4.0)
MCHC: 32.1 g/dL (ref 30.0–36.0)
MCV: 80.7 fl (ref 78.0–100.0)
Monocytes Absolute: 0.7 10*3/uL (ref 0.1–1.0)
Monocytes Relative: 10.4 % (ref 3.0–12.0)
Neutro Abs: 2.6 10*3/uL (ref 1.4–7.7)
Neutrophils Relative %: 38.2 % — ABNORMAL LOW (ref 43.0–77.0)
Platelets: 225 10*3/uL (ref 150.0–400.0)
RBC: 5.11 Mil/uL (ref 3.87–5.11)
RDW: 17.1 % — ABNORMAL HIGH (ref 11.5–15.5)
WBC: 6.7 10*3/uL (ref 4.0–10.5)

## 2022-08-31 LAB — URINALYSIS, ROUTINE W REFLEX MICROSCOPIC
Bilirubin Urine: NEGATIVE
Hgb urine dipstick: NEGATIVE
Ketones, ur: NEGATIVE
Leukocytes,Ua: NEGATIVE
Nitrite: NEGATIVE
RBC / HPF: NONE SEEN (ref 0–?)
Specific Gravity, Urine: 1.025 (ref 1.000–1.030)
Total Protein, Urine: NEGATIVE
Urine Glucose: NEGATIVE
Urobilinogen, UA: 1 (ref 0.0–1.0)
pH: 5.5 (ref 5.0–8.0)

## 2022-08-31 LAB — BASIC METABOLIC PANEL
BUN: 23 mg/dL (ref 6–23)
CO2: 31 mEq/L (ref 19–32)
Calcium: 10 mg/dL (ref 8.4–10.5)
Chloride: 101 mEq/L (ref 96–112)
Creatinine, Ser: 1.43 mg/dL — ABNORMAL HIGH (ref 0.40–1.20)
GFR: 39.68 mL/min — ABNORMAL LOW (ref >60.00)
Glucose, Bld: 93 mg/dL (ref 70–99)
Potassium: 3.9 mEq/L (ref 3.5–5.1)
Sodium: 140 mEq/L (ref 135–145)

## 2022-08-31 LAB — HEPATIC FUNCTION PANEL
ALT: 16 U/L (ref 0–35)
AST: 21 U/L (ref 0–37)
Albumin: 4.2 g/dL (ref 3.5–5.2)
Alkaline Phosphatase: 135 U/L — ABNORMAL HIGH (ref 39–117)
Bilirubin, Direct: 0.1 mg/dL (ref 0.0–0.3)
Total Bilirubin: 0.3 mg/dL (ref 0.2–1.2)
Total Protein: 8.1 g/dL (ref 6.0–8.3)

## 2022-08-31 LAB — LIPASE: Lipase: 31 U/L (ref 11.0–59.0)

## 2022-08-31 LAB — H. PYLORI ANTIBODY, IGG: H Pylori IgG: NEGATIVE

## 2022-09-06 ENCOUNTER — Other Ambulatory Visit: Payer: Self-pay | Admitting: Internal Medicine

## 2022-09-06 DIAGNOSIS — T50905A Adverse effect of unspecified drugs, medicaments and biological substances, initial encounter: Secondary | ICD-10-CM

## 2022-09-06 DIAGNOSIS — I10 Essential (primary) hypertension: Secondary | ICD-10-CM

## 2022-09-09 ENCOUNTER — Ambulatory Visit (INDEPENDENT_AMBULATORY_CARE_PROVIDER_SITE_OTHER): Payer: Medicare Other

## 2022-09-09 VITALS — Ht 61.0 in | Wt 247.0 lb

## 2022-09-09 DIAGNOSIS — Z0000 Encounter for general adult medical examination without abnormal findings: Secondary | ICD-10-CM | POA: Diagnosis not present

## 2022-09-09 NOTE — Patient Instructions (Addendum)
[name] , Thank you for taking time to come for your Medicare Wellness Visit. I appreciate your ongoing commitment to your health goals. Please review the following plan we discussed and let me know if I can assist you in the future.   These are the goals we discussed:  Goals      My goal is to lose 60 pounds.        This is a list of the screening recommended for you and due dates:  Health Maintenance  Topic Date Due   Zoster (Shingles) Vaccine (1 of 2) Never done   COVID-19 Vaccine (3 - Pfizer risk series) 03/12/2020   Complete foot exam   04/16/2022   Eye exam for diabetics  05/16/2022   Flu Shot  06/08/2022   Yearly kidney health urinalysis for diabetes  10/22/2022   Hemoglobin A1C  12/04/2022   Yearly kidney function blood test for diabetes  08/31/2023   Medicare Annual Wellness Visit  09/10/2023   Mammogram  06/18/2024   Pap Smear  02/22/2025   Colon Cancer Screening  01/29/2026   Tetanus Vaccine  07/18/2029   Hepatitis C Screening: USPSTF Recommendation to screen - Ages 18-79 yo.  Completed   HIV Screening  Completed   HPV Vaccine  Aged Out    Advanced directives: No  Conditions/risks identified: Yes  Next appointment: Follow up in one year for your annual wellness visit.   Preventive Care 40-64 Years, Female Preventive care refers to lifestyle choices and visits with your health care provider that can promote health and wellness. What does preventive care include? A yearly physical exam. This is also called an annual well check. Dental exams once or twice a year. Routine eye exams. Ask your health care provider how often you should have your eyes checked. Personal lifestyle choices, including: Daily care of your teeth and gums. Regular physical activity. Eating a healthy diet. Avoiding tobacco and drug use. Limiting alcohol use. Practicing safe sex. Taking low-dose aspirin daily starting at age 18. Taking vitamin and mineral supplements as recommended by  your health care provider. What happens during an annual well check? The services and screenings done by your health care provider during your annual well check will depend on your age, overall health, lifestyle risk factors, and family history of disease. Counseling  Your health care provider may ask you questions about your: Alcohol use. Tobacco use. Drug use. Emotional well-being. Home and relationship well-being. Sexual activity. Eating habits. Work and work Statistician. Method of birth control. Menstrual cycle. Pregnancy history. Screening  You may have the following tests or measurements: Height, weight, and BMI. Blood pressure. Lipid and cholesterol levels. These may be checked every 5 years, or more frequently if you are over 34 years old. Skin check. Lung cancer screening. You may have this screening every year starting at age 68 if you have a 30-pack-year history of smoking and currently smoke or have quit within the past 15 years. Fecal occult blood test (FOBT) of the stool. You may have this test every year starting at age 49. Flexible sigmoidoscopy or colonoscopy. You may have a sigmoidoscopy every 5 years or a colonoscopy every 10 years starting at age 5. Hepatitis C blood test. Hepatitis B blood test. Sexually transmitted disease (STD) testing. Diabetes screening. This is done by checking your blood sugar (glucose) after you have not eaten for a while (fasting). You may have this done every 1-3 years. Mammogram. This may be done every 1-2 years. Talk  to your health care provider about when you should start having regular mammograms. This may depend on whether you have a family history of breast cancer. BRCA-related cancer screening. This may be done if you have a family history of breast, ovarian, tubal, or peritoneal cancers. Pelvic exam and Pap test. This may be done every 3 years starting at age 83. Starting at age 13, this may be done every 5 years if you have a Pap  test in combination with an HPV test. Bone density scan. This is done to screen for osteoporosis. You may have this scan if you are at high risk for osteoporosis. Discuss your test results, treatment options, and if necessary, the need for more tests with your health care provider. Vaccines  Your health care provider may recommend certain vaccines, such as: Influenza vaccine. This is recommended every year. Tetanus, diphtheria, and acellular pertussis (Tdap, Td) vaccine. You may need a Td booster every 10 years. Zoster vaccine. You may need this after age 40. Pneumococcal 13-valent conjugate (PCV13) vaccine. You may need this if you have certain conditions and were not previously vaccinated. Pneumococcal polysaccharide (PPSV23) vaccine. You may need one or two doses if you smoke cigarettes or if you have certain conditions. Talk to your health care provider about which screenings and vaccines you need and how often you need them. This information is not intended to replace advice given to you by your health care provider. Make sure you discuss any questions you have with your health care provider. Document Released: 11/21/2015 Document Revised: 07/14/2016 Document Reviewed: 08/26/2015 Elsevier Interactive Patient Education  2017 [employer] Prevention in the Home Falls can cause injuries. They can happen to people of all ages. There are many things you can do to make your home safe and to help prevent falls. What can I do on the outside of my home? Regularly fix the edges of walkways and driveways and fix any cracks. Remove anything that might make you trip as you walk through a door, such as a raised step or threshold. Trim any bushes or trees on the path to your home. Use bright outdoor lighting. Clear any walking paths of anything that might make someone trip, such as rocks or tools. Regularly check to see if handrails are loose or broken. Make sure that both sides of any steps  have handrails. Any raised decks and porches should have guardrails on the edges. Have any leaves, snow, or ice cleared regularly. Use sand or salt on walking paths during winter. Clean up any spills in your garage right away. This includes oil or grease spills. What can I do in the bathroom? Use night lights. Install grab bars by the toilet and in the tub and shower. Do not use towel bars as grab bars. Use non-skid mats or decals in the tub or shower. If you need to sit down in the shower, use a plastic, non-slip stool. Keep the floor dry. Clean up any water that spills on the floor as soon as it happens. Remove soap buildup in the tub or shower regularly. Attach bath mats securely with double-sided non-slip rug tape. Do not have throw rugs and other things on the floor that can make you trip. What can I do in the bedroom? Use night lights. Make sure that you have a light by your bed that is easy to reach. Do not use any sheets or blankets that are too big for your bed. They should not  hang down onto the floor. Have a firm chair that has side arms. You can use this for support while you get dressed. Do not have throw rugs and other things on the floor that can make you trip. What can I do in the kitchen? Clean up any spills right away. Avoid walking on wet floors. Keep items that you use a lot in easy-to-reach places. If you need to reach something above you, use a strong step stool that has a grab bar. Keep electrical cords out of the way. Do not use floor polish or wax that makes floors slippery. If you must use wax, use non-skid floor wax. Do not have throw rugs and other things on the floor that can make you trip. What can I do with my stairs? Do not leave any items on the stairs. Make sure that there are handrails on both sides of the stairs and use them. Fix handrails that are broken or loose. Make sure that handrails are as long as the stairways. Check any carpeting to make  sure that it is firmly attached to the stairs. Fix any carpet that is loose or worn. Avoid having throw rugs at the top or bottom of the stairs. If you do have throw rugs, attach them to the floor with carpet tape. Make sure that you have a light switch at the top of the stairs and the bottom of the stairs. If you do not have them, ask someone to add them for you. What else can I do to help prevent falls? Wear shoes that: Do not have high heels. Have rubber bottoms. Are comfortable and fit you well. Are closed at the toe. Do not wear sandals. If you use a stepladder: Make sure that it is fully opened. Do not climb a closed stepladder. Make sure that both sides of the stepladder are locked into place. Ask someone to hold it for you, if possible. Clearly mark and make sure that you can see: Any grab bars or handrails. First and last steps. Where the edge of each step is. Use tools that help you move around (mobility aids) if they are needed. These include: Canes. Walkers. Scooters. Crutches. Turn on the lights when you go into a dark area. Replace any light bulbs as soon as they burn out. Set up your furniture so you have a clear path. Avoid moving your furniture around. If any of your floors are uneven, fix them. If there are any pets around you, be aware of where they are. Review your medicines with your doctor. Some medicines can make you feel dizzy. This can increase your chance of falling. Ask your doctor what other things that you can do to help prevent falls. This information is not intended to replace advice given to you by your health care provider. Make sure you discuss any questions you have with your health care provider. Document Released: 08/21/2009 Document Revised: 04/01/2016 Document Reviewed: 11/29/2014 Elsevier Interactive Patient Education  2017 [employer].

## 2022-09-09 NOTE — Telephone Encounter (Signed)
Patient stated that her r for: Sucralfate and Pantoprazole was not sent to [facility].  She is in a lot of abdominal pain and needs to know what she can and can not eat because she was not provided that information at her visit.  Also patient is requesting a GI referral for severe abdominal pain radiating to her back.

## 2022-09-09 NOTE — Progress Notes (Signed)
Virtual Visit via Telephone Note  I connected with  [name] on 09/09/22 at 10:45 AM EDT by telephone and verified that I am speaking with the correct person using two identifiers.  Location: Patient: Home Provider: [place] Persons participating in the virtual visit: [place]   I discussed the limitations, risks, security and privacy concerns of performing an evaluation and management service by telephone and the availability of in person appointments. The patient expressed understanding and agreed to proceed.  Interactive audio and video telecommunications were attempted between this nurse and patient, however failed, due to patient having technical difficulties OR patient did not have access to video capability.  We continued and completed visit with audio only.  Some vital signs may be absent or patient reported.   Sheral Flow, LPN  Subjective:   [name] is a 61 y.o. female who presents for Medicare Annual (Subsequent) preventive examination.  Review of Systems     Cardiac Risk Factors include: advanced age (>62mn, >>25women);diabetes mellitus;dyslipidemia;obesity (BMI >30kg/m2);hypertension;family history of premature cardiovascular disease     Objective:    Today's Vitals   09/09/22 1046  Weight: 247 lb (112 kg)  Height: _0  (1.549 m)   Body mass index is 46.67 kg/m.     09/09/2022   10:55 AM 12/22/2021    9:50 AM 10/17/2019   10:28 AM 09/07/2019    2:01 PM 05/28/2016   10:39 AM 05/18/2016   12:03 PM 04/06/2016   11:12 AM  Advanced Directives  Does Patient Have a Medical Advance Directive? _1  No No  Would patient like information on creating a medical advance directive? No - Patient declined   No - Patient declined       Current Medications (verified) Outpatient Encounter Medications as of 09/09/2022  Medication Sig   Accu-Chek FastClix Lancets MISC USE 1  TO CHECK GLUCOSE TWICE DAILY   albuterol  (PROVENTIL HFA;VENTOLIN HFA) 108 (90 Base) MCG/ACT inhaler Inhale 2 puffs into the lungs every 4 (four) hours as needed for wheezing or shortness of breath (cough, shortness of breath or wheezing.).   blood glucose meter kit and supplies KIT Use to test blood sugar twice daily. DX: E11.8   budesonide-formoterol (SYMBICORT) 80-4.5 MCG/ACT inhaler Inhale 2 puffs into the lungs 2 (two) times daily as needed (shortness of breath, wheezing).   Cholecalciferol (VITAMIN D3) 25 MCG (1000 UT) CAPS Take 1 capsule (1,000 Units total) by mouth daily.   empagliflozin (JARDIANCE) 10 MG TABS tablet Take 1 tablet (10 mg total) by mouth daily before breakfast.   Fluticasone-Umeclidin-Vilant (TRELEGY ELLIPTA) 200-62.5-25 MCG/ACT AEPB Inhale 1 puff into the lungs daily.   glucose blood (ACCU-CHEK GUIDE) test strip Use as instructed twice daily.   HYDROcodone-acetaminophen (NORCO/VICODIN) 5-325 MG tablet Take 1 tablet by mouth every 6 (six) hours as needed for severe pain.   L-Methylfolate-Algae-B12-B6 (METANX) 3-90.314-2-35 MG CAPS Take 1 capsule by mouth 2 (two) times daily.   levocetirizine (XYZAL) 5 MG tablet TAKE 1 TABLET BY MOUTH ONCE DAILY IN THE EVENING   lidocaine (LIDODERM) 5 % Place 2 patches onto the skin daily for 15 days. Remove & Discard patch within 12 hours.   pantoprazole (PROTONIX) 40 MG tablet Take 1 tablet (40 mg total) by mouth daily.   potassium chloride (KLOR-CON) 10 MEQ tablet Take 1 tablet by mouth twice daily   pregabalin (LYRICA) 50 MG capsule Take 1 capsule (50 mg total) by mouth at bedtime.  rosuvastatin (CRESTOR) 20 MG tablet Take 1 tablet by mouth once daily   sucralfate (CARAFATE) 1 g tablet Take 1 tablet (1 g total) by mouth 4 (four) times daily.   tiZANidine (ZANAFLEX) 4 MG tablet TAKE 1 TABLET BY MOUTH ONCE DAILY AS NEEDED FOR MUSCLE SPASM   triamcinolone cream (KENALOG) 0.5 % APPLY ONE APPLICATION TOPICALLY THREE TIMES DAILY.   triamterene-hydrochlorothiazide (DYAZIDE) 37.5-25 MG  capsule Take 1 capsule by mouth once daily   vitamin B-12 (CYANOCOBALAMIN) 1000 MCG tablet Take 1 tablet (1,000 mcg total) by mouth daily.   No facility-administered encounter medications on file as of 09/09/2022.    Allergies (verified) Verapamil   History: Past Medical History:  Diagnosis Date   Allergy    rhinitis   Anemia    Arthritis    Asthma    Diabetes mellitus without complication (HCC)    AYTKZSWF(093.2)    History of stomach ulcers    Hypertension    Internal hemorrhoids    Past Surgical History:  Procedure Laterality Date   CARPAL TUNNEL RELEASE Right    TUBAL LIGATION     Family History  Problem Relation Age of Onset   Arthritis Other    Diabetes Other    Hypertension Other    Breast cancer Other    Hypertension Mother    Diabetes Sister    Hypertension Sister    Breast cancer Daughter    Cancer Neg Hx    Early death Neg Hx    Hearing loss Neg Hx    Heart disease Neg Hx    Hyperlipidemia Neg Hx    Kidney disease Neg Hx    Stroke Neg Hx    Colon cancer Neg Hx    Social History   Socioeconomic History   Marital status: Married    Spouse name: Lorenzo   Number of children: 4   Years of education: 12   Highest education level: Not on file  Occupational History   Occupation: disabilty  Tobacco Use   Smoking status: Never    Passive exposure: Yes   Smokeless tobacco: Never   Tobacco comments:    husband smokes outside and inside the home  Vaping Use   Vaping Use: Never used  Substance and Sexual Activity   Alcohol use: No    Alcohol/week: 0.0 standard drinks of alcohol   Drug use: No   Sexual activity: Yes    Birth control/protection: Post-menopausal, Surgical  Other Topics Concern   Not on file  Social History Narrative   Lives at home w/ husband   Right handed   Drinks caffeine once a week    Social Determinants of Health   Financial Resource Strain: Low Risk  (09/09/2022)   Overall Financial Resource Strain (CARDIA)     Difficulty of Paying Living Expenses: Not hard at all  Food Insecurity: No Food Insecurity (09/09/2022)   Hunger Vital Sign    Worried About Running Out of Food in the Last Year: Never true    Ran Out of Food in the Last Year: Never true  Transportation Needs: No Transportation Needs (09/09/2022)   PRAPARE - Hydrologist (Medical): No    Lack of Transportation (Non-Medical): No  Physical Activity: Sufficiently Active (09/09/2022)   Exercise Vital Sign    Days of Exercise per Week: 5 days    Minutes of Exercise per Session: 30 min  Stress: No Stress Concern Present (09/09/2022)   [place] -  Occupational Stress Questionnaire    Feeling of Stress : Not at all  Social Connections: Socially Integrated (09/09/2022)   Social Connection and Isolation Panel [NHANES]    Frequency of Communication with Friends and Family: More than three times a week    Frequency of Social Gatherings with Friends and Family: More than three times a week    Attends Religious Services: More than 4 times per year    Active Member of [employer] or Organizations: Yes    Attends Music therapist: More than 4 times per year    Marital Status: Married    Tobacco Counseling Counseling given: Not Answered Tobacco comments: husband smokes outside and inside the home   Clinical Intake:  Pre-visit preparation completed: Yes  Pain : No/denies pain     Nutritional Risks: None Diabetes: No CBG done?: No Did pt. bring in CBG monitor from home?: No  How often do you need to have someone help you when you read instructions, pamphlets, or other written materials from your doctor or pharmacy?: 1 - Never What is the last grade level you completed in school?: HSG  Diabetic? Prediabetes  Interpreter Needed?: No  Information entered by :: Lisette Abu, LPN.   Activities of Daily Living    09/09/2022   10:56 AM  In your present state of health, do  you have any difficulty performing the following activities:  Hearing? 0  Vision? 0  Difficulty concentrating or making decisions? 0  Walking or climbing stairs? 0  Dressing or bathing? 0  Doing errands, shopping? 0  Preparing Food and eating ? N  Using the Toilet? N  In the past six months, have you accidently leaked urine? N  Do you have problems with loss of bowel control? N  Managing your Medications? N  Managing your Finances? N  Housekeeping or managing your Housekeeping? N    Patient Care Team: [name], MD as PCP - General Buford Dresser, MD as PCP - Cardiology (Cardiology) Kennith Gain, MD as Consulting Physician (Allergy) Magnus Sinning, MD as Consulting Physician (Physical Medicine and Rehabilitation) Charlton Haws, [facility] as Pharmacist (Pharmacist)  Indicate any recent Medical Services you may have received from other than [facility] providers in the past year (date may be approximate).     Assessment:   This is a routine wellness examination for Javia.  Hearing/Vision screen Hearing Screening - Comments:: Denies hearing difficulties   Vision Screening - Comments:: Wears rx glasses - up to date with routine eye exams with [facility]   Dietary issues and exercise activities discussed: Current Exercise Habits: Home exercise routine, Type of exercise: walking, Time (Minutes): 30, Frequency (Times/Week): 5, Weekly Exercise (Minutes/Week): 150, Intensity: Moderate, Exercise limited by: orthopedic condition(s);respiratory conditions(s)   Goals Addressed   None   Depression Screen    09/09/2022   10:56 AM 06/03/2022    3:15 PM 02/22/2022    9:43 AM 07/30/2021    1:23 PM 04/16/2021    2:00 PM 09/07/2019    2:05 PM 07/04/2019   11:25 AM  PHQ 2/9 Scores  PHQ - 2 Score 0 0 0 0 0 1 0  PHQ- 9 Score   1        Fall Risk    09/09/2022   10:56 AM 06/03/2022    3:15 PM 07/30/2021    1:23 PM 09/07/2019    2:03 PM 07/04/2019   11:25 AM  Fall Risk    Falls in the past year? 0  0 0 1 0  Number falls in past yr: 0  0 1 0  Injury with Fall? 0  0 0 0  Risk for fall due to : No Fall Risks   Impaired balance/gait;Impaired mobility;History of fall(s)   Follow up Falls prevention discussed    Falls evaluation completed    FALL RISK PREVENTION PERTAINING TO THE HOME:  Any stairs in or around the home? No  If so, are there any without handrails? No  Home free of loose throw rugs in walkways, pet beds, electrical cords, etc? Yes  Adequate lighting in your home to reduce risk of falls? Yes   ASSISTIVE DEVICES UTILIZED TO PREVENT FALLS:  Life alert? No  Use of a cane, walker or w/c? No  Grab bars in the bathroom? No  Shower chair or bench in shower? No  Elevated toilet seat or a handicapped toilet? No   TIMED UP AND GO:  Was the test performed? No . Phone Visit  Cognitive Function:        09/09/2022   10:58 AM  6CIT Screen  What Year? 0 points  What month? 0 points  What time? 0 points  Count back from 20 0 points  Months in reverse 0 points  Repeat phrase 0 points  Total Score 0 points    Immunizations Immunization History  Administered Date(s) Administered   Influenza Split 09/27/2012   Influenza,inj,Quad PF,6+ Mos 08/23/2014, 11/26/2015, 08/26/2016, 07/05/2018, 07/04/2019, 09/17/2020, 07/30/2021   PFIZER(Purple Top)SARS-COV-2 Vaccination 01/07/2020, 02/13/2020   PNEUMOCOCCAL CONJUGATE-20 04/16/2021   PPD Test 08/23/2014   Pneumococcal Polysaccharide-23 09/27/2012, 04/17/2020   Tdap 06/07/2010, 07/19/2019    TDAP status: Up to date  Flu Vaccine status: Due, Education has been provided regarding the importance of this vaccine. Advised may receive this vaccine at local pharmacy or Health Dept. Aware to provide a copy of the vaccination record if obtained from local pharmacy or Health Dept. Verbalized acceptance and understanding.  Pneumococcal vaccine status: Up to date  Covid-19 vaccine status: Completed  vaccines  Qualifies for Shingles Vaccine? Yes   Zostavax completed No   Shingrix Completed?: No.    Education has been provided regarding the importance of this vaccine. Patient has been advised to call insurance company to determine out of pocket expense if they have not yet received this vaccine. Advised may also receive vaccine at local pharmacy or Health Dept. Verbalized acceptance and understanding.  Screening Tests Health Maintenance  Topic Date Due   Zoster Vaccines- Shingrix (1 of 2) Never done   COVID-19 Vaccine (3 - Pfizer risk series) 03/12/2020   FOOT EXAM  04/16/2022   OPHTHALMOLOGY EXAM  05/16/2022   INFLUENZA VACCINE  06/08/2022   Diabetic kidney evaluation - Urine ACR  10/22/2022   HEMOGLOBIN A1C  12/04/2022   Diabetic kidney evaluation - GFR measurement  08/31/2023   Medicare Annual Wellness (AWV)  09/10/2023   MAMMOGRAM  06/18/2024   PAP SMEAR-Modifier  02/22/2025   COLONOSCOPY (Pts 45-83yr Insurance coverage will need to be confirmed)  01/29/2026   TETANUS/TDAP  07/18/2029   Hepatitis C Screening  Completed   HIV Screening  Completed   HPV VACCINES  Aged Out    Health Maintenance  Health Maintenance Due  Topic Date Due   Zoster Vaccines- Shingrix (1 of 2) Never done   COVID-19 Vaccine (3 - Pfizer risk series) 03/12/2020   FOOT EXAM  04/16/2022   OPHTHALMOLOGY EXAM  05/16/2022   INFLUENZA VACCINE  06/08/2022  Colorectal cancer screening: Type of screening: Colonoscopy. Completed 01/30/2016. Repeat every 10 years  Mammogram status: Completed 06/18/2022. Repeat every year  Bone Density status: never done  Lung Cancer Screening: (Low Dose CT Chest recommended if Age 72-80 years, 30 pack-year currently smoking OR have quit w/in 15years.) does not qualify.   Lung Cancer Screening Referral: no  Additional Screening:  Hepatitis C Screening: does qualify; Completed 11/26/2015  Vision Screening: Recommended annual ophthalmology exams for early detection of  glaucoma and other disorders of the eye. Is the patient up to date with their annual eye exam?  Yes  Who is the provider or what is the name of the office in which the patient attends annual eye exams? [facility] If pt is not established with a provider, would they like to be referred to a provider to establish care? No .   Dental Screening: Recommended annual dental exams for proper oral hygiene  Community Resource Referral / Chronic Care Management: CRR required this visit?  No   CCM required this visit?  No      Plan:     I have personally reviewed and noted the following in the patient's chart:   Medical and social history Use of alcohol, tobacco or illicit drugs  Current medications and supplements including opioid prescriptions. Patient is currently taking opioid prescriptions. Information provided to patient regarding non-opioid alternatives. Patient advised to discuss non-opioid treatment plan with their provider. Functional ability and status Nutritional status Physical activity Advanced directives List of other physicians Hospitalizations, surgeries, and ER visits in previous 12 months Vitals Screenings to include cognitive, depression, and falls Referrals and appointments  In addition, I have reviewed and discussed with patient certain preventive protocols, quality metrics, and best practice recommendations. A written personalized care plan for preventive services as well as general preventive health recommendations were provided to patient.     Sheral Flow, LPN   [id number]   Nurse Notes: N/A

## 2022-09-10 NOTE — Telephone Encounter (Signed)
Pt would need ROV with PCP as may need CT scan, or go now to [facility] or ED

## 2022-09-15 NOTE — Telephone Encounter (Signed)
Called pt and let them know that based on both providers Dr. Jenny Reichmann and Dr. Ronnald Ramp she will need an office visit for medication she is wanting. Pt stated that she has already seen Dr. Jenny Reichmann about this issue and wen to urgent care for it, she will not been doing it again and Dr. Hannah Beat prescribe the medication, then that fine. Pt then hanged up

## 2022-09-22 ENCOUNTER — Telehealth: Payer: Self-pay | Admitting: Internal Medicine

## 2022-09-22 NOTE — Telephone Encounter (Signed)
Caller & Relationship to patient: Zoraida - self  Call back number: [phone]  Date of last office visit: 06-03-22  Date of next office visit: 11-22-22  Medication(s) to be refilled:  HYDROcodone-acetaminophen (NORCO/VICODIN) 5-325 MG tablet   Preferred Pharmacy: [place] ([place]), [place] - [address]

## 2022-09-23 ENCOUNTER — Other Ambulatory Visit: Payer: Self-pay | Admitting: Internal Medicine

## 2022-09-23 DIAGNOSIS — E1140 Type 2 diabetes mellitus with diabetic neuropathy, unspecified: Secondary | ICD-10-CM

## 2022-09-23 DIAGNOSIS — M170 Bilateral primary osteoarthritis of knee: Secondary | ICD-10-CM

## 2022-09-23 DIAGNOSIS — G8929 Other chronic pain: Secondary | ICD-10-CM

## 2022-09-23 MED ORDER — HYDROCODONE-ACETAMINOPHEN 5-325 MG PO TABS: 1.0000 | ORAL_TABLET | Freq: Four times a day (QID) | ORAL | 0 refills | Status: DC | PRN

## 2022-10-25 ENCOUNTER — Telehealth: Payer: Self-pay | Admitting: Internal Medicine

## 2022-10-25 ENCOUNTER — Other Ambulatory Visit: Payer: Self-pay | Admitting: Internal Medicine

## 2022-10-25 DIAGNOSIS — G8929 Other chronic pain: Secondary | ICD-10-CM

## 2022-10-25 DIAGNOSIS — E1140 Type 2 diabetes mellitus with diabetic neuropathy, unspecified: Secondary | ICD-10-CM

## 2022-10-25 DIAGNOSIS — M170 Bilateral primary osteoarthritis of knee: Secondary | ICD-10-CM

## 2022-10-25 MED ORDER — HYDROCODONE-ACETAMINOPHEN 5-325 MG PO TABS: 1.0000 | ORAL_TABLET | Freq: Four times a day (QID) | ORAL | 0 refills | Status: DC | PRN

## 2022-10-25 NOTE — Telephone Encounter (Signed)
Caller & Relationship to patient: [name] - self  Call back number: [phone]  Date of last office visit: 06-03-22  Date of next office visit: 11-22-22  Medication(s) to be refilled: HYDROcodone-acetaminophen (NORCO/VICODIN) 5-325 MG tablet   Preferred Pharmacy: [place] ([place]), [place] - [address]

## 2022-10-27 ENCOUNTER — Ambulatory Visit: Payer: Medicare Other | Admitting: Orthopaedic Surgery

## 2022-11-06 ENCOUNTER — Other Ambulatory Visit: Payer: Self-pay | Admitting: Internal Medicine

## 2022-11-06 DIAGNOSIS — I10 Essential (primary) hypertension: Secondary | ICD-10-CM

## 2022-11-06 DIAGNOSIS — E785 Hyperlipidemia, unspecified: Secondary | ICD-10-CM

## 2022-11-06 DIAGNOSIS — T50905A Adverse effect of unspecified drugs, medicaments and biological substances, initial encounter: Secondary | ICD-10-CM

## 2022-11-12 ENCOUNTER — Ambulatory Visit: Payer: Medicare Other | Admitting: Orthopaedic Surgery

## 2022-11-22 ENCOUNTER — Encounter: Payer: Self-pay | Admitting: Internal Medicine

## 2022-11-22 ENCOUNTER — Ambulatory Visit (INDEPENDENT_AMBULATORY_CARE_PROVIDER_SITE_OTHER): Payer: Medicare Other | Admitting: Internal Medicine

## 2022-11-22 VITALS — BP 124/78 | HR 66 | Temp 98.2°F | Ht 61.0 in | Wt 246.0 lb

## 2022-11-22 DIAGNOSIS — J4520 Mild intermittent asthma, uncomplicated: Secondary | ICD-10-CM | POA: Diagnosis not present

## 2022-11-22 DIAGNOSIS — Z23 Encounter for immunization: Secondary | ICD-10-CM | POA: Diagnosis not present

## 2022-11-22 DIAGNOSIS — E669 Obesity, unspecified: Secondary | ICD-10-CM

## 2022-11-22 DIAGNOSIS — I10 Essential (primary) hypertension: Secondary | ICD-10-CM | POA: Diagnosis not present

## 2022-11-22 DIAGNOSIS — M5441 Lumbago with sciatica, right side: Secondary | ICD-10-CM | POA: Diagnosis not present

## 2022-11-22 DIAGNOSIS — E1169 Type 2 diabetes mellitus with other specified complication: Secondary | ICD-10-CM

## 2022-11-22 DIAGNOSIS — E1140 Type 2 diabetes mellitus with diabetic neuropathy, unspecified: Secondary | ICD-10-CM | POA: Diagnosis not present

## 2022-11-22 DIAGNOSIS — N1831 Chronic kidney disease, stage 3a: Secondary | ICD-10-CM

## 2022-11-22 DIAGNOSIS — K635 Polyp of colon: Secondary | ICD-10-CM

## 2022-11-22 DIAGNOSIS — M170 Bilateral primary osteoarthritis of knee: Secondary | ICD-10-CM | POA: Diagnosis not present

## 2022-11-22 DIAGNOSIS — G8929 Other chronic pain: Secondary | ICD-10-CM

## 2022-11-22 LAB — BASIC METABOLIC PANEL
BUN: 20 mg/dL (ref 6–23)
CO2: 30 mEq/L (ref 19–32)
Calcium: 9.7 mg/dL (ref 8.4–10.5)
Chloride: 101 mEq/L (ref 96–112)
Creatinine, Ser: 1.66 mg/dL — ABNORMAL HIGH (ref 0.40–1.20)
GFR: 33.12 mL/min — ABNORMAL LOW (ref >60.00)
Glucose, Bld: 106 mg/dL — ABNORMAL HIGH (ref 70–99)
Potassium: 3.9 mEq/L (ref 3.5–5.1)
Sodium: 142 mEq/L (ref 135–145)

## 2022-11-22 LAB — TSH: TSH: 1.33 u[IU]/mL (ref 0.35–5.50)

## 2022-11-22 LAB — URINALYSIS, ROUTINE W REFLEX MICROSCOPIC
Bilirubin Urine: NEGATIVE
Hgb urine dipstick: NEGATIVE
Leukocytes,Ua: NEGATIVE
Nitrite: NEGATIVE
RBC / HPF: NONE SEEN (ref 0–?)
Specific Gravity, Urine: 1.015 (ref 1.000–1.030)
Total Protein, Urine: NEGATIVE
Urine Glucose: NEGATIVE
Urobilinogen, UA: 2 — AB (ref 0.0–1.0)
WBC, UA: NONE SEEN (ref 0–?)
pH: 7.5 (ref 5.0–8.0)

## 2022-11-22 LAB — MICROALBUMIN / CREATININE URINE RATIO
Creatinine,U: 145 mg/dL
Microalb Creat Ratio: 0.5 mg/g (ref 0.0–30.0)
Microalb, Ur: <0.7 mg/dL (ref 0.0–1.9)

## 2022-11-22 LAB — HEMOGLOBIN A1C: Hgb A1c MFr Bld: 6.8 % — ABNORMAL HIGH (ref 4.6–6.5)

## 2022-11-22 MED ORDER — BUDESONIDE-FORMOTEROL FUMARATE 80-4.5 MCG/ACT IN AERO: 2.0000 | INHALATION_SPRAY | Freq: Two times a day (BID) | RESPIRATORY_TRACT | 5 refills | Status: DC

## 2022-11-22 MED ORDER — HYDROCODONE-ACETAMINOPHEN 5-325 MG PO TABS: 1.0000 | ORAL_TABLET | Freq: Four times a day (QID) | ORAL | 0 refills | Status: DC | PRN

## 2022-11-22 MED ORDER — METANX 3-90.314-2-35 MG PO CAPS: 1.0000 | ORAL_CAPSULE | Freq: Two times a day (BID) | ORAL | 3 refills | Status: DC

## 2022-11-22 NOTE — Progress Notes (Signed)
Subjective:  Patient ID: [name], female    DOB: 06-18-1961  Age: 62 y.o. MRN: [id number]  CC: Hypertension, Diabetes, Osteoarthritis, and Back Pain   HPI [name] presents for f/up -  She walks about 5 miles a day.  Her endurance is good.  She denies chest pain, shortness of breath, diaphoresis.  Outpatient Medications Prior to Visit  Medication Sig Dispense Refill   Accu-Chek FastClix Lancets MISC USE 1  TO CHECK GLUCOSE TWICE DAILY 102 each 3   albuterol (PROVENTIL HFA;VENTOLIN HFA) 108 (90 Base) MCG/ACT inhaler Inhale 2 puffs into the lungs every 4 (four) hours as needed for wheezing or shortness of breath (cough, shortness of breath or wheezing.). 1 Inhaler 1   blood glucose meter kit and supplies KIT Use to test blood sugar twice daily. DX: E11.8 1 each 0   Cholecalciferol (VITAMIN D3) 25 MCG (1000 UT) CAPS Take 1 capsule (1,000 Units total) by mouth daily. 90 capsule 3   empagliflozin (JARDIANCE) 10 MG TABS tablet Take 1 tablet (10 mg total) by mouth daily before breakfast. 90 tablet 0   glucose blood (ACCU-CHEK GUIDE) test strip Use as instructed twice daily. 300 each 1   levocetirizine (XYZAL) 5 MG tablet TAKE 1 TABLET BY MOUTH ONCE DAILY IN THE EVENING 90 tablet 1   pantoprazole (PROTONIX) 40 MG tablet Take 1 tablet (40 mg total) by mouth daily. 30 tablet 3   potassium chloride (KLOR-CON) 10 MEQ tablet Take 1 tablet by mouth twice daily 180 tablet 0   pregabalin (LYRICA) 50 MG capsule Take 1 capsule (50 mg total) by mouth at bedtime. 90 capsule 0   rosuvastatin (CRESTOR) 20 MG tablet Take 1 tablet by mouth once daily 90 tablet 0   sucralfate (CARAFATE) 1 g tablet Take 1 tablet (1 g total) by mouth 4 (four) times daily. 120 tablet 0   tiZANidine (ZANAFLEX) 4 MG tablet TAKE 1 TABLET BY MOUTH ONCE DAILY AS NEEDED FOR MUSCLE SPASM 30 tablet 0   triamcinolone cream (KENALOG) 0.5 % APPLY ONE APPLICATION TOPICALLY THREE TIMES DAILY. 225 g 0    triamterene-hydrochlorothiazide (DYAZIDE) 37.5-25 MG capsule Take 1 capsule by mouth once daily 90 capsule 0   vitamin B-12 (CYANOCOBALAMIN) 1000 MCG tablet Take 1 tablet (1,000 mcg total) by mouth daily. 90 tablet 3   budesonide-formoterol (SYMBICORT) 80-4.5 MCG/ACT inhaler Inhale 2 puffs into the lungs 2 (two) times daily as needed (shortness of breath, wheezing). 3 each 1   Fluticasone-Umeclidin-Vilant (TRELEGY ELLIPTA) 200-62.5-25 MCG/ACT AEPB Inhale 1 puff into the lungs daily. 120 each 1   HYDROcodone-acetaminophen (NORCO/VICODIN) 5-325 MG tablet Take 1 tablet by mouth every 6 (six) hours as needed for severe pain. 60 tablet 0   L-Methylfolate-Algae-B12-B6 (METANX) 3-90.314-2-35 MG CAPS Take 1 capsule by mouth 2 (two) times daily. 180 capsule 3   No facility-administered medications prior to visit.    ROS Review of Systems  Constitutional:  Negative for diaphoresis, fatigue and unexpected weight change.  HENT: Negative.    Eyes: Negative.   Respiratory:  Negative for cough, chest tightness, shortness of breath and wheezing.   Cardiovascular:  Negative for chest pain, palpitations and leg swelling.  Gastrointestinal: Negative.  Negative for abdominal pain, constipation, diarrhea, nausea and vomiting.  Endocrine: Negative.   Genitourinary: Negative.  Negative for difficulty urinating.  Musculoskeletal:  Positive for back pain. Negative for arthralgias, myalgias and neck pain.  Skin: Negative.   Neurological:  Negative for dizziness and weakness.  Hematological:  Negative for adenopathy. Does not bruise/bleed easily.  Psychiatric/Behavioral: Negative.      Objective:  BP 124/78 (BP Location: Left Arm, Patient Position: Sitting, Cuff Size: Large)   Pulse 66   Temp 98.2 F (36.8 C) (Oral)   Ht '5\' 1"'$  (1.549 m)   Wt 246 lb (111.6 kg)   LMP 05/17/2012   SpO2 98%   BMI 46.48 kg/m   BP Readings from Last 3 Encounters:  11/24/22 117/76  11/22/22 124/78  08/30/22 130/76    Wt  Readings from Last 3 Encounters:  11/24/22 246 lb (111.6 kg)  11/22/22 246 lb (111.6 kg)  09/09/22 247 lb (112 kg)    Physical Exam Vitals reviewed.  Constitutional:      Appearance: She is not ill-appearing.  HENT:     Mouth/Throat:     Mouth: Mucous membranes are moist.  Eyes:     General: No scleral icterus.    Conjunctiva/sclera: Conjunctivae normal.  Cardiovascular:     Rate and Rhythm: Normal rate and regular rhythm.     Heart sounds: No murmur heard.    Comments: EKG- NSR, 65 bpm Diffusely flat T waves No LVH or Q waves Pulmonary:     Effort: Pulmonary effort is normal.     Breath sounds: No stridor. No wheezing, rhonchi or rales.  Abdominal:     General: Abdomen is protuberant. Bowel sounds are normal. There is no distension.     Palpations: Abdomen is soft. There is no hepatomegaly, splenomegaly or mass.     Tenderness: There is no abdominal tenderness.  Musculoskeletal:        General: No swelling.     Cervical back: Neck supple.     Right lower leg: No edema.     Left lower leg: No edema.  Lymphadenopathy:     Cervical: No cervical adenopathy.  Skin:    General: Skin is warm and dry.  Neurological:     General: No focal deficit present.     Mental Status: She is alert. Mental status is at baseline.  Psychiatric:        Mood and Affect: Mood normal.        Behavior: Behavior normal.     Lab Results  Component Value Date   WBC 6.7 08/30/2022   HGB 13.2 08/30/2022   HCT 41.2 08/30/2022   PLT 225.0 08/30/2022   GLUCOSE 106 (H) 11/22/2022   CHOL 120 10/22/2021   TRIG 70.0 10/22/2021   HDL 45.40 10/22/2021   LDLCALC 61 10/22/2021   ALT 16 08/30/2022   AST 21 08/30/2022   NA 142 11/22/2022   K 3.9 11/22/2022   CL 101 11/22/2022   CREATININE 1.66 (H) 11/22/2022   BUN 20 11/22/2022   CO2 30 11/22/2022   TSH 1.33 11/22/2022   HGBA1C 6.8 (H) 11/22/2022   MICROALBUR <0.7 11/22/2022      Assessment & Plan:   [name] was seen today for  hypertension, diabetes, osteoarthritis and back pain.  Diagnoses and all orders for this visit:  Essential hypertension- Her blood pressure is adequately well-controlled. -     EKG 12-Lead -     Basic metabolic panel; Future -     TSH; Future -     Urinalysis, Routine w reflex microscopic; Future -     Urinalysis, Routine w reflex microscopic -     TSH -     Basic metabolic panel  Polyp of colon, unspecified part of colon, unspecified type -  Ambulatory referral to Gastroenterology  Stage 3a chronic kidney disease ([place])- Her renal function is stable. -     Microalbumin / creatinine urine ratio; Future -     Basic metabolic panel; Future -     Urinalysis, Routine w reflex microscopic; Future -     Urinalysis, Routine w reflex microscopic -     Basic metabolic panel -     Microalbumin / creatinine urine ratio  Type 2 diabetes mellitus with obesity ([place])- Her blood sugar is adequately well-controlled. -     Microalbumin / creatinine urine ratio; Future -     Basic metabolic panel; Future -     Hemoglobin A1c; Future -     Hemoglobin A1c -     Basic metabolic panel -     Microalbumin / creatinine urine ratio  Mild intermittent asthma without complication -     budesonide-formoterol (SYMBICORT) 80-4.5 MCG/ACT inhaler; Inhale 2 puffs into the lungs 2 (two) times daily.  Primary osteoarthritis of both knees -     HYDROcodone-acetaminophen (NORCO/VICODIN) 5-325 MG tablet; Take 1 tablet by mouth every 6 (six) hours as needed for severe pain.  Chronic painful diabetic neuropathy (HCC) -     HYDROcodone-acetaminophen (NORCO/VICODIN) 5-325 MG tablet; Take 1 tablet by mouth every 6 (six) hours as needed for severe pain. -     L-Methylfolate-Algae-B12-B6 (METANX) 3-90.314-2-35 MG CAPS; Take 1 capsule by mouth 2 (two) times daily.  Chronic right-sided low back pain with right-sided sciatica -     HYDROcodone-acetaminophen (NORCO/VICODIN) 5-325 MG tablet; Take 1 tablet by mouth every 6  (six) hours as needed for severe pain.  Other orders -     Flu Vaccine QUAD 6+ mos PF IM (Fluarix Quad PF)   I have discontinued Gudelia P. Suess's budesonide-formoterol and Trelegy Ellipta. I am also having her start on budesonide-formoterol. Additionally, I am having her maintain her albuterol, blood glucose meter kit and supplies, Accu-Chek FastClix Lancets, Accu-Chek Guide, cyanocobalamin, Vitamin D3, pregabalin, tiZANidine, empagliflozin, triamcinolone cream, levocetirizine, pantoprazole, sucralfate, potassium chloride, triamterene-hydrochlorothiazide, rosuvastatin, HYDROcodone-acetaminophen, and Metanx.  Meds ordered this encounter  Medications   budesonide-formoterol (SYMBICORT) 80-4.5 MCG/ACT inhaler    Sig: Inhale 2 puffs into the lungs 2 (two) times daily.    Dispense:  1 each    Refill:  5   HYDROcodone-acetaminophen (NORCO/VICODIN) 5-325 MG tablet    Sig: Take 1 tablet by mouth every 6 (six) hours as needed for severe pain.    Dispense:  60 tablet    Refill:  0   L-Methylfolate-Algae-B12-B6 (METANX) 3-90.314-2-35 MG CAPS    Sig: Take 1 capsule by mouth 2 (two) times daily.    Dispense:  180 capsule    Refill:  3     Follow-up: Return in about 6 months (around 05/23/2023).  [name], MD

## 2022-11-22 NOTE — Patient Instructions (Signed)

## 2022-11-24 ENCOUNTER — Ambulatory Visit: Payer: Medicare Other | Admitting: Orthopaedic Surgery

## 2022-11-24 ENCOUNTER — Encounter: Payer: Self-pay | Admitting: Orthopaedic Surgery

## 2022-11-24 ENCOUNTER — Other Ambulatory Visit: Payer: Self-pay | Admitting: Internal Medicine

## 2022-11-24 ENCOUNTER — Telehealth: Payer: Self-pay | Admitting: Internal Medicine

## 2022-11-24 VITALS — BP 117/76 | HR 60 | Ht 61.0 in | Wt 246.0 lb

## 2022-11-24 DIAGNOSIS — M4316 Spondylolisthesis, lumbar region: Secondary | ICD-10-CM | POA: Diagnosis not present

## 2022-11-24 DIAGNOSIS — M47816 Spondylosis without myelopathy or radiculopathy, lumbar region: Secondary | ICD-10-CM | POA: Diagnosis not present

## 2022-11-24 NOTE — Telephone Encounter (Signed)
Pt has been informed that Rx was sent on 1/15

## 2022-11-24 NOTE — Telephone Encounter (Signed)
Caller & Relationship to patient: [name] - self  Call back number: [phone]  Date of last office visit: 11-22-22  Date of next office visit: None scheduled  Medication(s) to be refilled: HYDROcodone-acetaminophen (NORCO/VICODIN) 5-325 MG tablet    Preferred Pharmacy: [facility] [place] - [address]

## 2022-11-24 NOTE — Progress Notes (Signed)
Office Visit Note   Patient: [name]           Date of Birth: 02-Oct-1961           MRN: [id number] Visit Date: 11/24/2022              Requested by: Crystal Lima, MD [address] [place],  [place] [address] PCP: Crystal Lima, MD   Assessment & Plan: Visit Diagnoses:  1. Lumbar facet arthropathy   2. Spondylolisthesis of lumbar region     Plan: Today's visit we reviewed her MRI scan again.  She has facet arthropathy edema around the facets.  Anterolisthesis at L4-5 and facet arthropathy with edema is at the L3-4 level.  She will call and make a new appointment get started weight loss to get her BMI down below 40.  She can return when she achieves this.  Follow-Up Instructions: No follow-ups on file.   Orders:  No orders of the defined types were placed in this encounter.  No orders of the defined types were placed in this encounter.     Procedures: No procedures performed   Clinical Data: No additional findings.   Subjective: Chief Complaint  Patient presents with   Lower Back - Pain, Follow-up    HPI 62 year old female returns with chronic back pain.  She has some spondylolisthesis and was referred to Dr. Leafy Berry for weight loss with BMI of 46.  States she had an appointment could not find a place she does not drive.  She is placed on hydrocodone by Dr. Ronnald Berry from a viral clinic that is been taking this.  She still is not been the weight loss center.  But the information on her phone under notes so she has the address and phone number.  I recommended she go by there once before appointment so she knows where it is and she will call make a new appointment.  Review of Systems updated unchanged.   Objective: Vital Signs: BP 117/76   Pulse 60   Ht '5\' 1"'$  (1.549 m)   Wt 246 lb (111.6 kg)   LMP 05/17/2012   BMI 46.48 kg/m   Physical Exam Constitutional:      Appearance: She is well-developed.  HENT:     Head: Normocephalic.     Right Ear:  External ear normal.     Left Ear: External ear normal. There is no impacted cerumen.  Eyes:     Pupils: Pupils are equal, round, and reactive to light.  Neck:     Thyroid: No thyromegaly.     Trachea: No tracheal deviation.  Cardiovascular:     Rate and Rhythm: Normal rate.  Pulmonary:     Effort: Pulmonary effort is normal.  Abdominal:     Palpations: Abdomen is soft.  Musculoskeletal:     Cervical back: No rigidity.  Skin:    General: Skin is warm and dry.  Neurological:     Mental Status: She is alert and oriented to person, place, and time.  Psychiatric:        Behavior: Behavior normal.     Ortho Exam ankle dorsiflexion plantarflexion is intact.  No rash over exposed skin.  She is slow getting from sitting to standing.  Negative logroll hips.  Specialty Comments:  No specialty comments available.  Imaging: No results found.   PMFS History: Patient Active Problem List   Diagnosis Date Noted   Polyp of colon 11/22/2022   Epigastric pain 08/30/2022  Long-term current use of opiate analgesic 06/03/2022   Osteoarthritis of multiple joints 12/29/2021   Screening for cervical cancer 10/23/2021   Need for shingles vaccine 10/23/2021   Encounter for general adult medical examination with abnormal findings 10/22/2021   Chronic painful diabetic neuropathy ([place]) 09/21/2021   Prolonged Q-T interval on ECG 10/18/2019   Type 2 diabetes mellitus with obesity ([place]) 07/11/2019   Intrinsic eczema 07/04/2019   CKD (chronic kidney disease) stage 3, GFR 30-59 ml/min (HCC) 10/11/2018   Class 3 severe obesity due to excess calories with serious comorbidity and body mass index (BMI) of 40.0 to 44.9 in adult ([place]) 09/14/2018   Chronic right-sided low back pain with right-sided sciatica 09/14/2018   Hyperlipidemia with target LDL less than 130 07/05/2018   Mild intermittent asthma without complication 79/15/0569   Snoring 12/14/2016   Essential hypertension 12/14/2016    Gastroesophageal reflux disease with esophagitis 08/26/2016   Dysphagia 08/26/2016   Spondylolisthesis of lumbar region 05/28/2016   Fibromyalgia 08/08/2015   Lumbar facet arthropathy 08/08/2015   Primary osteoarthritis of both knees 08/25/2014   Encounter for screening mammogram for breast cancer 06/01/2013   Abnormal electrocardiogram 02/05/2010   ALLERGIC RHINITIS 01/26/2010   Past Medical History:  Diagnosis Date   Allergy    rhinitis   Anemia    Arthritis    Asthma    Diabetes mellitus without complication (HCC)    [id number](537.4)    History of stomach ulcers    Hypertension    Internal hemorrhoids     Family History  Problem Relation Age of Onset   Arthritis Other    Diabetes Other    Hypertension Other    Breast cancer Other    Hypertension Mother    Diabetes Sister    Hypertension Sister    Breast cancer Daughter    Cancer Neg Hx    Early death Neg Hx    Hearing loss Neg Hx    Heart disease Neg Hx    Hyperlipidemia Neg Hx    Kidney disease Neg Hx    Stroke Neg Hx    Colon cancer Neg Hx     Past Surgical History:  Procedure Laterality Date   CARPAL TUNNEL RELEASE Right    TUBAL LIGATION     Social History   Occupational History   Occupation: disabilty  Tobacco Use   Smoking status: Never    Passive exposure: Yes   Smokeless tobacco: Never   Tobacco comments:    husband smokes outside and inside the home  Vaping Use   Vaping Use: Never used  Substance and Sexual Activity   Alcohol use: No    Alcohol/week: 0.0 standard drinks of alcohol   Drug use: No   Sexual activity: Yes    Birth control/protection: Post-menopausal, Surgical

## 2022-11-26 ENCOUNTER — Encounter: Payer: Self-pay | Admitting: Physician Assistant

## 2022-12-09 ENCOUNTER — Other Ambulatory Visit: Payer: Self-pay | Admitting: Internal Medicine

## 2022-12-09 DIAGNOSIS — E876 Hypokalemia: Secondary | ICD-10-CM

## 2022-12-09 DIAGNOSIS — I10 Essential (primary) hypertension: Secondary | ICD-10-CM

## 2022-12-17 ENCOUNTER — Other Ambulatory Visit (INDEPENDENT_AMBULATORY_CARE_PROVIDER_SITE_OTHER): Payer: Medicare Other

## 2022-12-17 ENCOUNTER — Ambulatory Visit: Payer: Medicare Other | Admitting: Physician Assistant

## 2022-12-17 ENCOUNTER — Encounter: Payer: Self-pay | Admitting: Physician Assistant

## 2022-12-17 VITALS — BP 118/78 | HR 75 | Ht 61.0 in | Wt 245.8 lb

## 2022-12-17 DIAGNOSIS — R110 Nausea: Secondary | ICD-10-CM

## 2022-12-17 DIAGNOSIS — R1013 Epigastric pain: Secondary | ICD-10-CM

## 2022-12-17 LAB — CBC WITH DIFFERENTIAL/PLATELET
Basophils Absolute: 0 10*3/uL (ref 0.0–0.1)
Basophils Relative: 0.6 % (ref 0.0–3.0)
Eosinophils Absolute: 0.2 10*3/uL (ref 0.0–0.7)
Eosinophils Relative: 4.4 % (ref 0.0–5.0)
HCT: 40.4 % (ref 36.0–46.0)
Hemoglobin: 13 g/dL (ref 12.0–15.0)
Lymphocytes Relative: 44.4 % (ref 12.0–46.0)
Lymphs Abs: 2 10*3/uL (ref 0.7–4.0)
MCHC: 32.2 g/dL (ref 30.0–36.0)
MCV: 79.5 fl (ref 78.0–100.0)
Monocytes Absolute: 0.4 10*3/uL (ref 0.1–1.0)
Monocytes Relative: 9.7 % (ref 3.0–12.0)
Neutro Abs: 1.9 10*3/uL (ref 1.4–7.7)
Neutrophils Relative %: 40.9 % — ABNORMAL LOW (ref 43.0–77.0)
Platelets: 228 10*3/uL (ref 150.0–400.0)
RBC: 5.08 Mil/uL (ref 3.87–5.11)
RDW: 16.8 % — ABNORMAL HIGH (ref 11.5–15.5)
WBC: 4.6 10*3/uL (ref 4.0–10.5)

## 2022-12-17 LAB — COMPREHENSIVE METABOLIC PANEL
ALT: 15 U/L (ref 0–35)
AST: 17 U/L (ref 0–37)
Albumin: 3.9 g/dL (ref 3.5–5.2)
Alkaline Phosphatase: 122 U/L — ABNORMAL HIGH (ref 39–117)
BUN: 22 mg/dL (ref 6–23)
CO2: 26 mEq/L (ref 19–32)
Calcium: 9.2 mg/dL (ref 8.4–10.5)
Chloride: 104 mEq/L (ref 96–112)
Creatinine, Ser: 1.44 mg/dL — ABNORMAL HIGH (ref 0.40–1.20)
GFR: 39.26 mL/min — ABNORMAL LOW (ref >60.00)
Glucose, Bld: 92 mg/dL (ref 70–99)
Potassium: 3.5 mEq/L (ref 3.5–5.1)
Sodium: 142 mEq/L (ref 135–145)
Total Bilirubin: 0.3 mg/dL (ref 0.2–1.2)
Total Protein: 7.3 g/dL (ref 6.0–8.3)

## 2022-12-17 LAB — LIPASE: Lipase: 26 U/L (ref 11.0–59.0)

## 2022-12-17 LAB — H. PYLORI ANTIBODY, IGG: H Pylori IgG: NEGATIVE

## 2022-12-17 MED ORDER — FAMOTIDINE 20 MG PO TABS: 20.0000 mg | ORAL_TABLET | Freq: Two times a day (BID) | ORAL | 2 refills | Status: AC

## 2022-12-17 NOTE — Patient Instructions (Signed)
_______________________________________________________  If your blood pressure at your visit was 140/90 or greater, please contact your primary care physician to follow up on this.  _______________________________________________________  If you are age 62 or older, your body mass index should be between 23-30. Your Body mass index is 46.44 kg/m. If this is out of the aforementioned range listed, please consider follow up with your Primary Care Provider.  If you are age 53 or younger, your body mass index should be between 19-25. Your Body mass index is 46.44 kg/m. If this is out of the aformentioned range listed, please consider follow up with your Primary Care Provider.   ________________________________________________________  The [facility] providers would like to encourage you to use [facility] to communicate with providers for non-urgent requests or questions.  Due to long hold times on the telephone, sending your provider a message by [facility] may be a faster and more efficient way to get a response.  Please allow 48 business hours for a response.  Please remember that this is for non-urgent requests.  _______________________________________________________  Your provider has requested that you go to the basement level for lab work before leaving today. Press "B" on the elevator. The lab is located at the first door on the left as you exit the elevator.  We have sent the following medications to your pharmacy for you to pick up at your convenience:  Pepcid  Continue daily Protonix  Eat a bland, low fat diet  You have been scheduled for an abdominal ultrasound at [facility] (1st floor of hospital) on 12/22/2022 at 10:00am. Please arrive 15 minutes prior to your appointment for registration. Make certain not to have anything to eat or drink 6 hours prior to your appointment. Should you need to reschedule your appointment, please contact radiology at [phone]. This test  typically takes about 30 minutes to perform.

## 2022-12-17 NOTE — Progress Notes (Signed)
Subjective:    Patient ID: [name], female    DOB: 09-04-1961, 62 y.o.   MRN: [id number]  HPI  [name] is a 62 year old African-American female, known to Dr. Hilarie Fredrickson from colonoscopy done in March 2017 for screening.  She comes in today to discuss new GI symptoms which have been bothering her over the past few months.  She is wondering if she needs another colonoscopy. She says that over the past couple of months she has had epigastric abdominal pain and cramping after eating fatty foods and spicy foods like hot sauce and ketchup.  These episodes can be intense causing her to bend over on her knees.  The pain lasts usually for several minutes but has lasted as long as 30 minutes and does radiate through into her back.  These episodes are associated with nausea but no vomiting.  She is not having any problems with heartburn or indigestion.  She feels the symptoms are progressing.  She is trying to avoid offending foods but last night even after eating rice and a baked pork chop she had an episode.  She does have history of chronic GERD and is on pantoprazole 40 mg p.o. every morning.  She is not using any regular NSAIDs.  She has tried Pepto-Bismol without any relief for these episodes.  She has not started any new medications recently. She has not had any recent changes in her bowel habits, no issues with constipation or diarrhea, no melena or hematochezia. Other medical issues include adult onset diabetes mellitus with neuropathy, hypertension, asthma, chronic kidney disease stage III, history of prolonged QT, and chronic opiate use for back pain.  She has not had prior EGD, at colonoscopy in March 2017 she had a 4 mm polyp removed from the ascending colon and was noted to have internal hemorrhoids.  Path on the polyp showed it to be benign polypoid mucosa, and is therefore indicated for 10-year interval follow-up.  She has no family history of colon cancer.  Review of Systems Pertinent positive  and negative review of systems were noted in the above HPI section.  All other review of systems was otherwise negative.   Outpatient Encounter Medications as of 12/17/2022  Medication Sig   Accu-Chek FastClix Lancets MISC USE 1  TO CHECK GLUCOSE TWICE DAILY   albuterol (PROVENTIL HFA;VENTOLIN HFA) 108 (90 Base) MCG/ACT inhaler Inhale 2 puffs into the lungs every 4 (four) hours as needed for wheezing or shortness of breath (cough, shortness of breath or wheezing.).   blood glucose meter kit and supplies KIT Use to test blood sugar twice daily. DX: E11.8   budesonide-formoterol (SYMBICORT) 80-4.5 MCG/ACT inhaler Inhale 2 puffs into the lungs 2 (two) times daily.   Cholecalciferol (VITAMIN D3) 25 MCG (1000 UT) CAPS Take 1 capsule (1,000 Units total) by mouth daily.   empagliflozin (JARDIANCE) 10 MG TABS tablet Take 1 tablet (10 mg total) by mouth daily before breakfast.   famotidine (PEPCID) 20 MG tablet Take 1 tablet (20 mg total) by mouth 2 (two) times daily.   glucose blood (ACCU-CHEK GUIDE) test strip Use as instructed twice daily.   HYDROcodone-acetaminophen (NORCO/VICODIN) 5-325 MG tablet Take 1 tablet by mouth every 6 (six) hours as needed for severe pain.   levocetirizine (XYZAL) 5 MG tablet TAKE 1 TABLET BY MOUTH ONCE DAILY IN THE EVENING   pantoprazole (PROTONIX) 40 MG tablet Take 1 tablet (40 mg total) by mouth daily.   potassium chloride (KLOR-CON) 10 MEQ tablet Take 1  tablet by mouth twice daily   rosuvastatin (CRESTOR) 20 MG tablet Take 1 tablet by mouth once daily   triamcinolone cream (KENALOG) 0.5 % APPLY ONE APPLICATION TOPICALLY THREE TIMES DAILY.   triamterene-hydrochlorothiazide (DYAZIDE) 37.5-25 MG capsule Take 1 capsule by mouth once daily   vitamin B-12 (CYANOCOBALAMIN) 1000 MCG tablet Take 1 tablet (1,000 mcg total) by mouth daily.   L-Methylfolate-Algae-B12-B6 (METANX) 3-90.314-2-35 MG CAPS Take 1 capsule by mouth 2 (two) times daily. (Patient not taking: Reported on  12/17/2022)   pregabalin (LYRICA) 50 MG capsule Take 1 capsule (50 mg total) by mouth at bedtime. (Patient not taking: Reported on 12/17/2022)   sucralfate (CARAFATE) 1 g tablet Take 1 tablet (1 g total) by mouth 4 (four) times daily. (Patient not taking: Reported on 12/17/2022)   tiZANidine (ZANAFLEX) 4 MG tablet TAKE 1 TABLET BY MOUTH ONCE DAILY AS NEEDED FOR MUSCLE SPASM (Patient not taking: Reported on 12/17/2022)   No facility-administered encounter medications on file as of 12/17/2022.   Allergies  Allergen Reactions   Verapamil Swelling   Patient Active Problem List   Diagnosis Date Noted   Polyp of colon 11/22/2022   Epigastric pain 08/30/2022   Long-term current use of opiate analgesic 06/03/2022   Osteoarthritis of multiple joints 12/29/2021   Screening for cervical cancer 10/23/2021   Need for shingles vaccine 10/23/2021   Encounter for general adult medical examination with abnormal findings 10/22/2021   Chronic painful diabetic neuropathy ([place]) 09/21/2021   Prolonged Q-T interval on ECG 10/18/2019   Type 2 diabetes mellitus with obesity ([place]) 07/11/2019   Intrinsic eczema 07/04/2019   CKD (chronic kidney disease) stage 3, GFR 30-59 ml/min ([facility]) 10/11/2018   Class 3 severe obesity due to excess calories with serious comorbidity and body mass index (BMI) of 40.0 to 44.9 in adult ([place]) 09/14/2018   Chronic right-sided low back pain with right-sided sciatica 09/14/2018   Hyperlipidemia with target LDL less than 130 07/05/2018   Mild intermittent asthma without complication 0000000   Snoring 12/14/2016   Essential hypertension 12/14/2016   Gastroesophageal reflux disease with esophagitis 08/26/2016   Dysphagia 08/26/2016   Spondylolisthesis of lumbar region 05/28/2016   Fibromyalgia 08/08/2015   Lumbar facet arthropathy 08/08/2015   Primary osteoarthritis of both knees 08/25/2014   Encounter for screening mammogram for breast cancer 06/01/2013   Abnormal electrocardiogram  02/05/2010   ALLERGIC RHINITIS 01/26/2010   Social History   Socioeconomic History   Marital status: Married    Spouse name: Teacher, adult education   Number of children: 4   Years of education: 12   Highest education level: Not on file  Occupational History   Occupation: disabilty  Tobacco Use   Smoking status: Never    Passive exposure: Yes   Smokeless tobacco: Never   Tobacco comments:    husband smokes outside and inside the home  Vaping Use   Vaping Use: Never used  Substance and Sexual Activity   Alcohol use: No    Alcohol/week: 0.0 standard drinks of alcohol   Drug use: No   Sexual activity: Yes    Birth control/protection: Post-menopausal, Surgical  Other Topics Concern   Not on file  Social History Narrative   Lives at home w/ husband   Right handed   Drinks caffeine once a week    Social Determinants of Health   Financial Resource Strain: Low Risk  (09/09/2022)   Overall Financial Resource Strain (CARDIA)    Difficulty of Paying Living Expenses: Not hard at  all  Food Insecurity: No Food Insecurity (09/09/2022)   Hunger Vital Sign    Worried About Running Out of Food in the Last Year: Never true    Ran Out of Food in the Last Year: Never true  Transportation Needs: No Transportation Needs (09/09/2022)   PRAPARE - Hydrologist (Medical): No    Lack of Transportation (Non-Medical): No  Physical Activity: Sufficiently Active (09/09/2022)   Exercise Vital Sign    Days of Exercise per Week: 5 days    Minutes of Exercise per Session: 30 min  Stress: No Stress Concern Present (09/09/2022)   [place]    Feeling of Stress : Not at all  Social Connections: [place] (09/09/2022)   Social Connection and Isolation Panel [NHANES]    Frequency of Communication with Friends and Family: More than three times a week    Frequency of Social Gatherings with Friends and Family: More than  three times a week    Attends Religious Services: More than 4 times per year    Active Member of [employer] or Organizations: Yes    Attends Archivist Meetings: More than 4 times per year    Marital Status: Married  Human resources officer Violence: Not At Risk (09/09/2022)   Humiliation, Afraid, Rape, and Kick questionnaire    Fear of Current or Ex-Partner: No    Emotionally Abused: No    Physically Abused: No    Sexually Abused: No    Ms. Rojero's family history includes Arthritis in an other family member; Breast cancer in her daughter and another family member; Diabetes in her sister and another family member; Hypertension in her mother, sister, and another family member.      Objective:    Vitals:   12/17/22 0827  BP: 118/78  Pulse: 75  SpO2: 95%    Physical Exam Well-developed well-nourished f obese AA female  in no acute distress.  Height, [id number] BMI 46.4  HEENT; nontraumatic normocephalic, EOMI, PE R LA, sclera anicteric. Oropharynx;not examined today Neck; supple, no JVD Cardiovascular; regular rate and rhythm with S1-S2, no murmur rub or gallop Pulmonary; Clear bilaterally Abdomen; soft, nontender, minimal tenderness in the epigastrium ,nondistended, no palpable mass or hepatosplenomegaly, bowel sounds are active Rectal;not done Skin; benign exam, no jaundice rash or appreciable lesions Extremities; no clubbing cyanosis or edema skin warm and dry Neuro/Psych; alert and oriented x4, grossly nonfocal mood and affect appropriate        Assessment & Plan:   #77 62 year old African-American female with 89-monthhistory of intermittent episodes of epigastric pain radiating through to the back associated with nausea without vomiting and usually triggered by fatty foods and spicy foods. She is on chronic Protonix 40 mg daily for GERD.  No new medications recently, no NSAID use.  Etiology of her current symptoms is not clear, though suspicious for gallbladder  disease/biliary colic.  Also consider gastropathy, peptic ulcer disease, nonulcer dyspepsia.  #2 colon cancer screening-up-to-date with last colonoscopy March 2017, one 4 mm polyp removed which was benign polypoid mucosa and therefore indicated for 10-year interval follow-up/2027  #3 adult onset diabetes mellitus with neuropathy #4.  Chronic back pain #5.  Chronic opiate use #6.  Asthma #7.  Morbid obesity with BMI 46.4 #8  Chronic kidney disease stage III #9 history of prolonged QT  Plan; Bland low-fat diet Continue Protonix 40 mg p.o. every morning Add Pepcid 20 mg p.o. twice daily  short-term CBC with differential, c-Met, lipase, H. pylori IgG Scheduled for upper abdominal ultrasound If labs and [name] are  unrevealing she will need EGD with Dr. Hilarie Fredrickson.  EGD was discussed with her today at the time of office visit.   Doral Ventrella Genia Harold PA-C 12/17/2022   Cc: [name], MD

## 2022-12-22 ENCOUNTER — Ambulatory Visit (HOSPITAL_COMMUNITY): Payer: Medicare Other

## 2022-12-27 NOTE — Progress Notes (Signed)
Addendum: Reviewed and agree with assessment and management plan. Sherrin Stahle M, MD  

## 2022-12-29 ENCOUNTER — Ambulatory Visit (HOSPITAL_COMMUNITY): Admission: RE | Admit: 2022-12-29 | Payer: Medicare Other | Source: Ambulatory Visit

## 2023-01-03 ENCOUNTER — Telehealth: Payer: Self-pay | Admitting: Internal Medicine

## 2023-01-03 NOTE — Telephone Encounter (Signed)
MEDICATION:HYDROcodone-acetaminophen (NORCO/VICODIN) 5-325 MG tablet   [place] ([place]), [place] - [address]   Comments:   **Let patient know to contact pharmacy at the end of the day to make sure medication is ready. **  ** Please notify patient to allow 48-72 hours to process**  **Encourage patient to contact the pharmacy for refills or they can request refills through [facility]**

## 2023-01-04 ENCOUNTER — Other Ambulatory Visit: Payer: Self-pay | Admitting: Internal Medicine

## 2023-01-04 DIAGNOSIS — L2084 Intrinsic (allergic) eczema: Secondary | ICD-10-CM

## 2023-01-04 DIAGNOSIS — H6992 Unspecified Eustachian tube disorder, left ear: Secondary | ICD-10-CM

## 2023-01-05 ENCOUNTER — Other Ambulatory Visit: Payer: Self-pay | Admitting: Internal Medicine

## 2023-01-05 DIAGNOSIS — E1140 Type 2 diabetes mellitus with diabetic neuropathy, unspecified: Secondary | ICD-10-CM

## 2023-01-05 DIAGNOSIS — M170 Bilateral primary osteoarthritis of knee: Secondary | ICD-10-CM

## 2023-01-05 DIAGNOSIS — G8929 Other chronic pain: Secondary | ICD-10-CM

## 2023-01-05 MED ORDER — HYDROCODONE-ACETAMINOPHEN 5-325 MG PO TABS: 1.0000 | ORAL_TABLET | Freq: Four times a day (QID) | ORAL | 0 refills | Status: DC | PRN

## 2023-01-05 NOTE — Telephone Encounter (Signed)
Pt called asking for a rx refill for her :HYDROcodone-acetaminophen (NORCO/VICODIN) 5-325 MG tablet     levocetirizine (XYZAL) 5 MG tablet    Please give the pt a call when medication has been sent in.

## 2023-02-03 ENCOUNTER — Other Ambulatory Visit: Payer: Self-pay | Admitting: Internal Medicine

## 2023-02-03 ENCOUNTER — Telehealth: Payer: Self-pay | Admitting: Internal Medicine

## 2023-02-03 DIAGNOSIS — M170 Bilateral primary osteoarthritis of knee: Secondary | ICD-10-CM

## 2023-02-03 DIAGNOSIS — G8929 Other chronic pain: Secondary | ICD-10-CM

## 2023-02-03 DIAGNOSIS — I10 Essential (primary) hypertension: Secondary | ICD-10-CM

## 2023-02-03 DIAGNOSIS — E1140 Type 2 diabetes mellitus with diabetic neuropathy, unspecified: Secondary | ICD-10-CM

## 2023-02-03 DIAGNOSIS — T50905A Adverse effect of unspecified drugs, medicaments and biological substances, initial encounter: Secondary | ICD-10-CM

## 2023-02-03 MED ORDER — HYDROCODONE-ACETAMINOPHEN 5-325 MG PO TABS: 1.0000 | ORAL_TABLET | Freq: Four times a day (QID) | ORAL | 0 refills | Status: AC | PRN

## 2023-02-03 NOTE — Telephone Encounter (Signed)
Prescription Request  02/03/2023  LOV: 11/22/2022  What is the name of the medication or equipment? hydrocodone  Have you contacted your pharmacy to request a refill? No   Which pharmacy would you like this sent to?  [place] ([place]), [place] - [address] [place] ([place])  [address] Phone: [phone] Fax: [phone]     Patient notified that their request is being sent to the clinical staff for review and that they should receive a response within 2 business days.   Please advise at Mobile [phone] (mobile)

## 2023-02-08 ENCOUNTER — Other Ambulatory Visit: Payer: Self-pay | Admitting: Internal Medicine

## 2023-02-08 DIAGNOSIS — E785 Hyperlipidemia, unspecified: Secondary | ICD-10-CM

## 2023-03-05 ENCOUNTER — Other Ambulatory Visit: Payer: Self-pay | Admitting: Internal Medicine

## 2023-03-05 DIAGNOSIS — T50905A Adverse effect of unspecified drugs, medicaments and biological substances, initial encounter: Secondary | ICD-10-CM

## 2023-03-05 DIAGNOSIS — I10 Essential (primary) hypertension: Secondary | ICD-10-CM

## 2023-03-07 ENCOUNTER — Telehealth: Payer: Self-pay | Admitting: Internal Medicine

## 2023-03-07 NOTE — Telephone Encounter (Signed)
Prescription Request  03/07/2023  LOV: 11/22/2022  What is the name of the medication or equipment?  HYDROcodone-acetaminophen (NORCO/VICODIN) 5-325 MG tablet    Have you contacted your pharmacy to request a refill? No   Which pharmacy would you like this sent to?    [id number] - [place] ([place]), [place] - [address], [place] ([place]) [place] [address] Phone: [phone]  Fax: [phone]  Patient notified that their request is being sent to the clinical staff for review and that they should receive a response within 2 business days.   Please advise at Mobile [phone] (mobile)

## 2023-03-09 NOTE — Telephone Encounter (Signed)
Patient called to check on the status of her refill. Best callback is [phone].

## 2023-03-11 NOTE — Telephone Encounter (Signed)
Pt call back this morning inquiring about her medication HYDROcodone-acetaminophen (NORCO/VICODIN) 5-325 MG tablet   Pt states she is completely out and she is in a lot of pain.

## 2023-03-15 DIAGNOSIS — E782 Mixed hyperlipidemia: Secondary | ICD-10-CM | POA: Diagnosis not present

## 2023-03-15 DIAGNOSIS — E1165 Type 2 diabetes mellitus with hyperglycemia: Secondary | ICD-10-CM | POA: Diagnosis not present

## 2023-03-15 DIAGNOSIS — G894 Chronic pain syndrome: Secondary | ICD-10-CM | POA: Diagnosis not present

## 2023-03-15 DIAGNOSIS — I10 Essential (primary) hypertension: Secondary | ICD-10-CM | POA: Diagnosis not present

## 2023-03-15 DIAGNOSIS — J45909 Unspecified asthma, uncomplicated: Secondary | ICD-10-CM | POA: Diagnosis not present

## 2023-03-15 DIAGNOSIS — Z79899 Other long term (current) drug therapy: Secondary | ICD-10-CM | POA: Diagnosis not present

## 2023-03-15 DIAGNOSIS — N1832 Chronic kidney disease, stage 3b: Secondary | ICD-10-CM | POA: Diagnosis not present

## 2023-03-29 DIAGNOSIS — M4316 Spondylolisthesis, lumbar region: Secondary | ICD-10-CM | POA: Diagnosis not present

## 2023-03-29 DIAGNOSIS — R7303 Prediabetes: Secondary | ICD-10-CM | POA: Diagnosis not present

## 2023-03-29 DIAGNOSIS — I10 Essential (primary) hypertension: Secondary | ICD-10-CM | POA: Diagnosis not present

## 2023-03-29 DIAGNOSIS — D509 Iron deficiency anemia, unspecified: Secondary | ICD-10-CM | POA: Diagnosis not present

## 2023-03-29 DIAGNOSIS — G2581 Restless legs syndrome: Secondary | ICD-10-CM | POA: Diagnosis not present

## 2023-03-29 DIAGNOSIS — N1832 Chronic kidney disease, stage 3b: Secondary | ICD-10-CM | POA: Diagnosis not present

## 2023-04-03 ENCOUNTER — Emergency Department (HOSPITAL_COMMUNITY): Payer: Medicare Other

## 2023-04-03 ENCOUNTER — Emergency Department (HOSPITAL_COMMUNITY)
Admission: EM | Admit: 2023-04-03 | Discharge: 2023-04-03 | Disposition: A | Discharge status: Home / Self Care | Payer: Medicare Other | Attending: Emergency Medicine | Admitting: Emergency Medicine

## 2023-04-03 ENCOUNTER — Encounter (HOSPITAL_COMMUNITY): Payer: Self-pay

## 2023-04-03 DIAGNOSIS — R079 Chest pain, unspecified: Secondary | ICD-10-CM | POA: Diagnosis not present

## 2023-04-03 DIAGNOSIS — I10 Essential (primary) hypertension: Secondary | ICD-10-CM | POA: Insufficient documentation

## 2023-04-03 DIAGNOSIS — E119 Type 2 diabetes mellitus without complications: Secondary | ICD-10-CM | POA: Diagnosis not present

## 2023-04-03 DIAGNOSIS — E860 Dehydration: Secondary | ICD-10-CM | POA: Insufficient documentation

## 2023-04-03 DIAGNOSIS — R001 Bradycardia, unspecified: Secondary | ICD-10-CM | POA: Diagnosis not present

## 2023-04-03 DIAGNOSIS — R1013 Epigastric pain: Secondary | ICD-10-CM | POA: Diagnosis not present

## 2023-04-03 DIAGNOSIS — K219 Gastro-esophageal reflux disease without esophagitis: Secondary | ICD-10-CM | POA: Diagnosis not present

## 2023-04-03 DIAGNOSIS — I700 Atherosclerosis of aorta: Secondary | ICD-10-CM | POA: Diagnosis not present

## 2023-04-03 DIAGNOSIS — R1010 Upper abdominal pain, unspecified: Secondary | ICD-10-CM | POA: Diagnosis present

## 2023-04-03 LAB — CBC WITH DIFFERENTIAL/PLATELET
Abs Immature Granulocytes: 0.06 10*3/uL (ref 0.00–0.07)
Basophils Absolute: 0 10*3/uL (ref 0.0–0.1)
Basophils Relative: 0 %
Eosinophils Absolute: 0 10*3/uL (ref 0.0–0.5)
Eosinophils Relative: 0 %
HCT: 44.8 % (ref 36.0–46.0)
Hemoglobin: 14.2 g/dL (ref 12.0–15.0)
Immature Granulocytes: 1 %
Lymphocytes Relative: 36 %
Lymphs Abs: 3.5 10*3/uL (ref 0.7–4.0)
MCH: 25.2 pg — ABNORMAL LOW (ref 26.0–34.0)
MCHC: 31.7 g/dL (ref 30.0–36.0)
MCV: 79.4 fL — ABNORMAL LOW (ref 80.0–100.0)
Monocytes Absolute: 0.8 10*3/uL (ref 0.1–1.0)
Monocytes Relative: 9 %
Neutro Abs: 5.3 10*3/uL (ref 1.7–7.7)
Neutrophils Relative %: 54 %
Platelets: 255 10*3/uL (ref 150–400)
RBC: 5.64 MIL/uL — ABNORMAL HIGH (ref 3.87–5.11)
RDW: 16.9 % — ABNORMAL HIGH (ref 11.5–15.5)
WBC: 9.7 10*3/uL (ref 4.0–10.5)
nRBC: 0 % (ref 0.0–0.2)

## 2023-04-03 LAB — URINALYSIS, ROUTINE W REFLEX MICROSCOPIC
Bilirubin Urine: NEGATIVE
Glucose, UA: NEGATIVE mg/dL
Hgb urine dipstick: NEGATIVE
Ketones, ur: NEGATIVE mg/dL
Leukocytes,Ua: NEGATIVE
Nitrite: NEGATIVE
Protein, ur: NEGATIVE mg/dL
Specific Gravity, Urine: 1.042 — ABNORMAL HIGH (ref 1.005–1.030)
pH: 6 (ref 5.0–8.0)

## 2023-04-03 LAB — COMPREHENSIVE METABOLIC PANEL
ALT: 22 U/L (ref 0–44)
AST: 22 U/L (ref 15–41)
Albumin: 3.5 g/dL (ref 3.5–5.0)
Alkaline Phosphatase: 103 U/L (ref 38–126)
Anion gap: 12 (ref 5–15)
BUN: 28 mg/dL — ABNORMAL HIGH (ref 8–23)
CO2: 24 mmol/L (ref 22–32)
Calcium: 8.9 mg/dL (ref 8.9–10.3)
Chloride: 102 mmol/L (ref 98–111)
Creatinine, Ser: 1.39 mg/dL — ABNORMAL HIGH (ref 0.44–1.00)
GFR, Estimated: 43 mL/min — ABNORMAL LOW (ref >60)
Glucose, Bld: 100 mg/dL — ABNORMAL HIGH (ref 70–99)
Potassium: 3.7 mmol/L (ref 3.5–5.1)
Sodium: 138 mmol/L (ref 135–145)
Total Bilirubin: 1.1 mg/dL (ref 0.3–1.2)
Total Protein: 7.2 g/dL (ref 6.5–8.1)

## 2023-04-03 LAB — LIPASE, BLOOD: Lipase: 29 U/L (ref 11–51)

## 2023-04-03 LAB — TROPONIN I (HIGH SENSITIVITY)
Troponin I (High Sensitivity): 4 ng/L (ref <18)
Troponin I (High Sensitivity): 5 ng/L (ref <18)

## 2023-04-03 MED ORDER — ONDANSETRON 4 MG PO TBDP: 4.0000 mg | ORAL_TABLET | Freq: Three times a day (TID) | ORAL | 0 refills | Status: AC | PRN

## 2023-04-03 MED ORDER — ONDANSETRON HCL 4 MG/2ML IJ SOLN
4.0000 mg | Freq: Once | INTRAMUSCULAR | Status: AC
  Administered 2023-04-03: 4 mg via INTRAVENOUS
  Filled 2023-04-03: qty 2

## 2023-04-03 MED ORDER — MORPHINE SULFATE (PF) 4 MG/ML IV SOLN
4.0000 mg | Freq: Once | INTRAVENOUS | Status: AC
  Administered 2023-04-03: 4 mg via INTRAVENOUS
  Filled 2023-04-03: qty 1

## 2023-04-03 MED ORDER — HYDROCODONE-ACETAMINOPHEN 5-325 MG PO TABS
1.0000 | ORAL_TABLET | Freq: Once | ORAL | Status: AC
  Administered 2023-04-03: 1 via ORAL
  Filled 2023-04-03: qty 1

## 2023-04-03 MED ORDER — IOHEXOL 350 MG/ML SOLN
100.0000 mL | Freq: Once | INTRAVENOUS | Status: AC | PRN
  Administered 2023-04-03: 100 mL via INTRAVENOUS

## 2023-04-03 MED ORDER — MAALOX MAX 400-400-40 MG/5ML PO SUSP: 10.0000 mL | Freq: Four times a day (QID) | ORAL | 0 refills | Status: DC | PRN

## 2023-04-03 MED ORDER — LACTATED RINGERS IV BOLUS
1000.0000 mL | Freq: Once | INTRAVENOUS | Status: AC
  Administered 2023-04-03: 1000 mL via INTRAVENOUS

## 2023-04-03 MED ORDER — OMEPRAZOLE 20 MG PO CPDR: 20.0000 mg | DELAYED_RELEASE_CAPSULE | Freq: Every day | ORAL | 0 refills | Status: DC

## 2023-04-03 MED ORDER — PANTOPRAZOLE SODIUM 40 MG IV SOLR
40.0000 mg | Freq: Once | INTRAVENOUS | Status: AC
  Administered 2023-04-03: 40 mg via INTRAVENOUS
  Filled 2023-04-03: qty 10

## 2023-04-03 MED ORDER — FAMOTIDINE IN NACL 20-0.9 MG/50ML-% IV SOLN
20.0000 mg | Freq: Once | INTRAVENOUS | Status: AC
  Administered 2023-04-03: 20 mg via INTRAVENOUS
  Filled 2023-04-03: qty 50

## 2023-04-03 MED ORDER — ALUM & MAG HYDROXIDE-SIMETH 200-200-20 MG/5ML PO SUSP
30.0000 mL | Freq: Once | ORAL | Status: AC
  Administered 2023-04-03: 30 mL via ORAL
  Filled 2023-04-03: qty 30

## 2023-04-03 NOTE — ED Triage Notes (Signed)
Pt c/o right sided abd pain and back pain since last night. Denies N/V/D. Pain is better when sitting  Pt recently started on prednisone for arthritis.

## 2023-04-03 NOTE — ED Provider Notes (Signed)
[place] EMERGENCY DEPARTMENT AT [facility] Provider Note   CSN: [id number] Arrival date & time: 04/03/23  7829     History  Chief Complaint  Patient presents with   Abdominal Pain   Back Pain    [name] is a 62 y.o. female.  With PMH of DM2, HTN, GERD/PUD who presents with upper abdominal pain radiating to back since last night.  She said it woke her up in the middle of the night.  Describes it as stabbing and burning.  She does note inability to eat spicy or hot foods but did not have anything like that last night.  It has been constant in nature.  She has some associated chest pain tingling she reports but nothing to worry about as she says.  She has been on prednisone for the past 4 days for back issues.  She has had no fevers, no chills, no nausea, no vomiting or diarrhea.  Last bowel movement this morning was nonbloody and no melena noted.  She has had no history of surgeries or gallbladder disease or kidney stones.  Denies any pain with urination or hematuria.   Abdominal Pain Back Pain Associated symptoms: abdominal pain        Home Medications Prior to Admission medications   Medication Sig Start Date End Date Taking? Authorizing Provider  alum & mag hydroxide-simeth (MAALOX MAX) 400-400-40 MG/5ML suspension Take 10 mLs by mouth every 6 (six) hours as needed for indigestion. 04/03/23  Yes Mardene Sayer, MD  omeprazole (PRILOSEC) 20 MG capsule Take 1 capsule (20 mg total) by mouth daily. 04/03/23  Yes Mardene Sayer, MD  ondansetron (ZOFRAN-ODT) 4 MG disintegrating tablet Take 1 tablet (4 mg total) by mouth every 8 (eight) hours as needed. 04/03/23  Yes Mardene Sayer, MD  Accu-Chek FastClix Lancets MISC USE 1  TO CHECK GLUCOSE TWICE DAILY 11/29/19   Etta Grandchild, MD  albuterol (PROVENTIL HFA;VENTOLIN HFA) 108 (90 Base) MCG/ACT inhaler Inhale 2 puffs into the lungs every 4 (four) hours as needed for wheezing or shortness of breath (cough,  shortness of breath or wheezing.). 03/11/18   Elvina Sidle, MD  blood glucose meter kit and supplies KIT Use to test blood sugar twice daily. DX: E11.8 07/30/19   Etta Grandchild, MD  budesonide-formoterol (SYMBICORT) 80-4.5 MCG/ACT inhaler Inhale 2 puffs into the lungs 2 (two) times daily. 11/22/22   Etta Grandchild, MD  Cholecalciferol (VITAMIN D3) 25 MCG (1000 UT) CAPS Take 1 capsule (1,000 Units total) by mouth daily. 08/28/21   Pincus Sanes, MD  empagliflozin (JARDIANCE) 10 MG TABS tablet Take 1 tablet (10 mg total) by mouth daily before breakfast. 06/03/22   Etta Grandchild, MD  famotidine (PEPCID) 20 MG tablet Take 1 tablet (20 mg total) by mouth 2 (two) times daily. 12/17/22   Esterwood, Amy S, PA-C  glucose blood (ACCU-CHEK GUIDE) test strip Use as instructed twice daily. 11/29/19   Etta Grandchild, MD  HYDROcodone-acetaminophen (NORCO/VICODIN) 5-325 MG tablet Take 1 tablet by mouth every 6 (six) hours as needed for severe pain. 02/03/23   Etta Grandchild, MD  L-Methylfolate-Algae-B12-B6 [facility]) 3-90.314-2-35 MG CAPS Take 1 capsule by mouth 2 (two) times daily. Patient not taking: Reported on 12/17/2022 11/22/22   Etta Grandchild, MD  levocetirizine (XYZAL) 5 MG tablet TAKE 1 TABLET BY MOUTH ONCE DAILY IN THE EVENING 01/04/23   Etta Grandchild, MD  pantoprazole (PROTONIX) 40 MG tablet Take 1  tablet (40 mg total) by mouth daily. 08/30/22   Corwin Levins, MD  potassium chloride (KLOR-CON) 10 MEQ tablet Take 1 tablet by mouth twice daily 03/05/23   Etta Grandchild, MD  pregabalin (LYRICA) 50 MG capsule Take 1 capsule (50 mg total) by mouth at bedtime. Patient not taking: Reported on 12/17/2022 09/21/21   Etta Grandchild, MD  rosuvastatin (CRESTOR) 20 MG tablet Take 1 tablet by mouth once daily 02/08/23   Etta Grandchild, MD  sucralfate (CARAFATE) 1 g tablet Take 1 tablet (1 g total) by mouth 4 (four) times daily. Patient not taking: Reported on 12/17/2022 08/30/22   Corwin Levins, MD  tiZANidine  (ZANAFLEX) 4 MG tablet TAKE 1 TABLET BY MOUTH ONCE DAILY AS NEEDED FOR MUSCLE SPASM Patient not taking: Reported on 12/17/2022 10/19/21   Etta Grandchild, MD  triamcinolone cream (KENALOG) 0.5 % APPLY ONE APPLICATION TOPICALLY THREE TIMES DAILY. 06/28/22   Etta Grandchild, MD  triamterene-hydrochlorothiazide (DYAZIDE) 37.5-25 MG capsule Take 1 capsule by mouth once daily 02/03/23   Etta Grandchild, MD  vitamin B-12 (CYANOCOBALAMIN) 1000 MCG tablet Take 1 tablet (1,000 mcg total) by mouth daily. 08/28/21   Pincus Sanes, MD      Allergies    Verapamil    Review of Systems   Review of Systems  Gastrointestinal:  Positive for abdominal pain.  Musculoskeletal:  Positive for back pain.    Physical Exam Updated Vital Signs BP 139/83   Pulse (!) 50   Temp 97.8 F (36.6 C)   Resp 16   Ht 5\' 1"  (1.549 m)   Wt 109.8 kg   LMP 05/17/2012   SpO2 96%   BMI 45.73 kg/m  Physical Exam Constitutional: Alert and oriented. Well appearing and in no distress. Eyes: Conjunctivae are normal. ENT      Mouth/Throat: Mucous membranes are moist. Cardiovascular: S1, S2,  Normal and symmetric distal pulses are present in all extremities.Warm and well perfused. Respiratory: Normal respiratory effort. Breath sounds are normal.  O2 sat 96 on RA Gastrointestinal: Soft, obese, nondistended with epigastrium and left upper quadrant tenderness with associated voluntary guarding as well as diffuse tenderness throughout the abdomen. Musculoskeletal: Normal range of motion in all extremities. No pitting edema of lower extremities Neurologic: Normal speech and language.  Moves all 4 extremities equally.  Sensation grossly intact.  No facial droop.  No gross focal neurologic deficits are appreciated. Skin: Skin is warm, dry and intact. No rash noted. Psychiatric: Mood and affect are normal. Speech and behavior are normal.  ED Results / Procedures / Treatments   Labs (all labs ordered are listed, but only abnormal  results are displayed) Labs Reviewed  COMPREHENSIVE METABOLIC PANEL - Abnormal; Notable for the following components:      Result Value   Glucose, Bld 100 (*)    BUN 28 (*)    Creatinine, Ser 1.39 (*)    GFR, Estimated 43 (*)    All other components within normal limits  CBC WITH DIFFERENTIAL/PLATELET - Abnormal; Notable for the following components:   RBC 5.64 (*)    MCV 79.4 (*)    MCH 25.2 (*)    RDW 16.9 (*)    All other components within normal limits  URINALYSIS, ROUTINE W REFLEX MICROSCOPIC - Abnormal; Notable for the following components:   Color, Urine STRAW (*)    Specific Gravity, Urine 1.042 (*)    All other components within normal limits  LIPASE, BLOOD  TROPONIN I (HIGH SENSITIVITY)  TROPONIN I (HIGH SENSITIVITY)    EKG EKG Interpretation  Date/Time:  Sunday Apr 03 2023 12:11:32 EDT Ventricular Rate:  49 PR Interval:  150 QRS Duration: 97 QT Interval:  624 QTC Calculation: 564 R Axis:   56 Text Interpretation: Sinus bradycardia Borderline T abnormalities, anterior leads Prolonged QT interval Since last tracing rate slower Confirmed by Vivien Rossetti ([id number]) on 04/03/2023 1:47:31 PM  Radiology CT Angio Chest/Abd/Pel for Dissection W and/or Wo Contrast  Result Date: 04/03/2023 CLINICAL DATA:  Mid abdominal pain, back pain and chest pain. EXAM: CT ANGIOGRAPHY CHEST, ABDOMEN AND PELVIS TECHNIQUE: Non-contrast CT of the chest was initially obtained. Multidetector CT imaging through the chest, abdomen and pelvis was performed using the standard protocol during bolus administration of intravenous contrast. Multiplanar reconstructed images and MIPs were obtained and reviewed to evaluate the vascular anatomy. RADIATION DOSE REDUCTION: This exam was performed according to the departmental dose-optimization program which includes automated exposure control, adjustment of the mA and/or kV according to patient size and/or use of iterative reconstruction technique. CONTRAST:   OMNIPAQUE IOHEXOL 350 MG/ML SOLN COMPARISON:  Coronary CTA on 01/08/2020 FINDINGS: CTA CHEST FINDINGS Cardiovascular: The pulmonary arteries are adequately opacified. There is no evidence of pulmonary embolism. Central pulmonary arteries are of normal caliber. The thoracic aorta is normal in caliber. Visualized proximal great vessels demonstrate normal patency and branching anatomy. No significant aortic atherosclerosis. The heart size is at the upper limits of normal. No pericardial fluid identified. No visualized calcified coronary artery plaque. Mediastinum/Nodes: No enlarged mediastinal, hilar, or axillary lymph nodes. Thyroid gland, trachea, and esophagus demonstrate no significant findings. Lungs/Pleura: There is no evidence of pulmonary edema, consolidation, pneumothorax, nodule or pleural fluid. Musculoskeletal: No chest wall abnormality. No acute or significant osseous findings. Review of the MIP images confirms the above findings. CTA ABDOMEN AND PELVIS FINDINGS VASCULAR Aorta: Normally patent abdominal aorta with minimal distal calcified plaque. No evidence of aneurysm or dissection. Celiac: Normally patent. Normally patent branch vessels and branching anatomy. SMA: Normally patent. Renals: Single right and paired left renal arteries demonstrate normal patency. IMA: Normally patent. Inflow: Normally patent bilateral iliac arteries, common femoral arteries and femoral bifurcations. Review of the MIP images confirms the above findings. NON-VASCULAR Hepatobiliary: No focal liver abnormality is seen. No gallstones, gallbladder wall thickening, or biliary dilatation. Pancreas: Unremarkable. No pancreatic ductal dilatation or surrounding inflammatory changes. Spleen: Normal in size without focal abnormality. Adrenals/Urinary Tract: Adrenal glands are unremarkable. Kidneys are normal, without renal calculi, focal lesion, or hydronephrosis. Bladder is unremarkable. Stomach/Bowel: Bowel shows no evidence of  obstruction, ileus, inflammation or lesion. The appendix is not definitely visualized. No free intraperitoneal air. Lymphatic: No enlarged lymph nodes identified in the abdomen or pelvis. Reproductive: Uterus and bilateral adnexa are unremarkable. Other: Small bilateral inguinal hernias containing fat. No ascites or abscess. Musculoskeletal: No acute or significant osseous findings. Review of the MIP images confirms the above findings. IMPRESSION: 1. No acute findings in the chest, abdomen or pelvis. 2. No evidence of pulmonary embolism or other acute findings in the chest. 3. Minimal atherosclerosis of the distal abdominal aorta without evidence of aortic aneurysm or dissection. 4. Small bilateral inguinal hernias containing fat. Electronically Signed   By: Irish Lack M.D.   On: 04/03/2023 11:54    Procedures Procedures    Medications Ordered in ED Medications  morphine (PF) 4 MG/ML injection 4 mg (4 mg Intravenous Given 04/03/23 1045)  ondansetron (ZOFRAN) injection 4 mg (4  mg Intravenous Given 04/03/23 1044)  famotidine (PEPCID) IVPB 20 mg premix (0 mg Intravenous Stopped 04/03/23 1120)  lactated ringers bolus 1,000 mL (1,000 mLs Intravenous New Bag/Given 04/03/23 1110)  iohexol (OMNIPAQUE) 350 MG/ML injection 100 mL (100 mLs Intravenous Contrast Given 04/03/23 1139)  pantoprazole (PROTONIX) injection 40 mg (40 mg Intravenous Given 04/03/23 1218)  alum & mag hydroxide-simeth (MAALOX/MYLANTA) 200-200-20 MG/5ML suspension 30 mL (30 mLs Oral Given 04/03/23 1214)  HYDROcodone-acetaminophen (NORCO/VICODIN) 5-325 MG per tablet 1 tablet (1 tablet Oral Given 04/03/23 1215)    ED Course/ Medical Decision Making/ A&P                             Medical Decision Making  [name] is a 62 y.o. female.  With PMH of DM2, HTN, GERD/PUD who presents with upper abdominal pain radiating to back since last night.  Based on patient's history and presentation with epigastrium pain radiating to back  although diffuse pain on exam, differential includes but not limited to peptic ulcer disease, gastritis, pancreatitis, nephrolithiasis, atypical ACS.  Less likely dissection with well appearance no pulse or neurologic deficits, no hypertension.  EKG sinus bradycardia with nonspecific T wave changes visualized in previous EKG with no evidence of heart block.  Repeat troponins were reassuring and unremarkable at 4 and 5, not concern for atypical ACS.  Lipase 29 reassuring no evidence of acute pancreatitis, no transaminitis noted and slightly elevated BUN 28 but improved creatinine 1.39 from baseline.  Suspect some mild dehydration his UA had elevated SG.  Her hemoglobin was normal at 14.2, doubt any active bleeding especially with no reported melena or bright red blood per rectum.  CTA dissection scan with no acute findings personally reviewed.  No evidence of dissection.  No evidence of acute intra-abdominal or pelvic abnormalities.  Suspect patient's symptoms are likely related to gastritis and underlying PUD but doubt active bleed at this time.  I suspect the prednisone she is currently taking is likely exacerbating symptoms.  She is almost done course so she will discontinue and start taking omeprazole as prescribed as well as Maalox.  I advised close follow-up with [facility] for outpatient endoscopy and strict return precautions for evidence of bleeding.  Also advised follow-up with cardiology for new bradycardia.  As discussed no evidence of heart block and no instability.  Strict return precautions discussed.  Patient in agreement with this plan and discharged in improved condition.  Amount and/or Complexity of Data Reviewed Labs: ordered. Radiology: ordered.  Risk OTC drugs. Prescription drug management.      Final Clinical Impression(s) / ED Diagnoses Final diagnoses:  Epigastric pain  Gastroesophageal reflux disease, unspecified whether esophagitis present  Dehydration   Bradycardia    Rx / DC Orders ED Discharge Orders          Ordered    Ambulatory referral to Cardiology       Comments: If you have not heard from the Cardiology office within the next 72 hours please call [phone].   04/03/23 1437    alum & mag hydroxide-simeth (MAALOX MAX) 400-400-40 MG/5ML suspension  Every 6 hours PRN        04/03/23 1438    ondansetron (ZOFRAN-ODT) 4 MG disintegrating tablet  Every 8 hours PRN        04/03/23 1438    omeprazole (PRILOSEC) 20 MG capsule  Daily        04/03/23 1438  Mardene Sayer, MD 04/03/23 [phone]

## 2023-04-03 NOTE — Discharge Instructions (Signed)
  You have been seen in the Emergency Department (ED) for abdominal pain. Your workup was generally reassuring; however, it did not identify a clear cause of your symptoms.  I suspect you likely have gastritis or inflammation of the stomach and possible underlying peptic ulcer disease.  Call  gastroenterology for follow-up in the outpatient setting and endoscopy.  Continue taking omeprazole or Protonix daily as prescribed.  You can also take the Zofran as needed for nausea or vomiting and the Maalox for indigestion.  I have referred you to cardiology for outpatient follow-up and Holter monitor for slower heart rates.  Please follow up with your primary care doctor as soon as possible regarding today's ED visit and the symptoms that are bothering you.  Return to the ED if your abdominal pain worsens or fails to improve, new severe chest pain, fainting episodes, you develop bloody vomiting, bloody diarrhea, you are unable to tolerate fluids due to vomiting, fever greater than 101, or any other concerning symptoms.

## 2023-04-03 NOTE — ED Notes (Signed)
Spoke with lab, troponin added on

## 2023-04-03 NOTE — ED Notes (Signed)
Patient transported to CT 

## 2023-04-07 ENCOUNTER — Telehealth: Payer: Self-pay

## 2023-04-07 DIAGNOSIS — H524 Presbyopia: Secondary | ICD-10-CM | POA: Diagnosis not present

## 2023-04-07 DIAGNOSIS — H5203 Hypermetropia, bilateral: Secondary | ICD-10-CM | POA: Diagnosis not present

## 2023-04-07 DIAGNOSIS — H2513 Age-related nuclear cataract, bilateral: Secondary | ICD-10-CM | POA: Diagnosis not present

## 2023-04-07 DIAGNOSIS — H52223 Regular astigmatism, bilateral: Secondary | ICD-10-CM | POA: Diagnosis not present

## 2023-04-07 NOTE — Telephone Encounter (Signed)
Transition Care Management Unsuccessful Follow-up Telephone Call  Date of discharge and from where:  [phone]  Attempts:  1st Attempt  Reason for unsuccessful TCM follow-up call:  Left voice message   Lenard Forth [facility] Guide, [facility] [phone] [address] [place], [place], [place] [address] Phone: [phone] Email: [email or web address]

## 2023-04-08 ENCOUNTER — Telehealth: Payer: Self-pay

## 2023-04-08 NOTE — Telephone Encounter (Signed)
Transition Care Management Follow-up Telephone Call Date of discharge and from where: [name] 5/26 How have you been since you were released from the hospital? ok Any questions or concerns? No  Items Reviewed: Did the pt receive and understand the discharge instructions provided? Yes  Medications obtained and verified? Yes  Other? No  Any new allergies since your discharge? No  Dietary orders reviewed? No Do you have support at home? Yes     Follow up appointments reviewed:  PCP Hospital f/u appt confirmed? Yes  Scheduled to see PCP on 6/6 @ . Specialist Hospital f/u appt confirmed? Yes  Scheduled to see  on  @ . Are transportation arrangements needed? NO If their condition worsens, is the pt aware to call PCP or go to the Emergency Dept.? Yes Was the patient provided with contact information for the PCP's office or ED? Yes Was to pt encouraged to call back with questions or concerns? Yes

## 2023-04-15 DIAGNOSIS — I10 Essential (primary) hypertension: Secondary | ICD-10-CM | POA: Diagnosis not present

## 2023-04-15 DIAGNOSIS — R1030 Lower abdominal pain, unspecified: Secondary | ICD-10-CM | POA: Diagnosis not present

## 2023-04-15 DIAGNOSIS — Z0001 Encounter for general adult medical examination with abnormal findings: Secondary | ICD-10-CM | POA: Diagnosis not present

## 2023-04-15 DIAGNOSIS — M545 Low back pain, unspecified: Secondary | ICD-10-CM | POA: Diagnosis not present

## 2023-04-21 ENCOUNTER — Other Ambulatory Visit: Payer: Self-pay

## 2023-04-21 ENCOUNTER — Emergency Department (HOSPITAL_COMMUNITY): Payer: Medicare Other

## 2023-04-21 ENCOUNTER — Emergency Department (HOSPITAL_COMMUNITY)
Admission: EM | Admit: 2023-04-21 | Discharge: 2023-04-21 | Discharge status: Against Medical Advice | Payer: Medicare Other | Attending: Emergency Medicine | Admitting: Emergency Medicine

## 2023-04-21 ENCOUNTER — Encounter (HOSPITAL_COMMUNITY): Payer: Self-pay

## 2023-04-21 DIAGNOSIS — W1840XA Slipping, tripping and stumbling without falling, unspecified, initial encounter: Secondary | ICD-10-CM | POA: Insufficient documentation

## 2023-04-21 DIAGNOSIS — R2243 Localized swelling, mass and lump, lower limb, bilateral: Secondary | ICD-10-CM | POA: Diagnosis not present

## 2023-04-21 DIAGNOSIS — Z5321 Procedure and treatment not carried out due to patient leaving prior to being seen by health care provider: Secondary | ICD-10-CM | POA: Insufficient documentation

## 2023-04-21 DIAGNOSIS — S99921A Unspecified injury of right foot, initial encounter: Secondary | ICD-10-CM | POA: Diagnosis not present

## 2023-04-21 NOTE — ED Triage Notes (Signed)
Pt states she tripped on the corner of the coffee table and hit her 4th and 5th right toes on coffee table. Pt has 1+ swelling to both toes. Pt denies pain at this time while sitting, but is painful when she puts pressure on it.Marland Kitchen

## 2023-04-22 DIAGNOSIS — S92501A Displaced unspecified fracture of right lesser toe(s), initial encounter for closed fracture: Secondary | ICD-10-CM | POA: Diagnosis not present

## 2023-04-22 DIAGNOSIS — M79671 Pain in right foot: Secondary | ICD-10-CM | POA: Diagnosis not present

## 2023-04-28 ENCOUNTER — Encounter (HOSPITAL_BASED_OUTPATIENT_CLINIC_OR_DEPARTMENT_OTHER): Payer: Self-pay | Admitting: Student

## 2023-04-28 ENCOUNTER — Ambulatory Visit (INDEPENDENT_AMBULATORY_CARE_PROVIDER_SITE_OTHER): Payer: Medicare Other | Admitting: Student

## 2023-04-28 ENCOUNTER — Ambulatory Visit (INDEPENDENT_AMBULATORY_CARE_PROVIDER_SITE_OTHER): Payer: Medicare Other

## 2023-04-28 ENCOUNTER — Other Ambulatory Visit (HOSPITAL_BASED_OUTPATIENT_CLINIC_OR_DEPARTMENT_OTHER): Payer: Self-pay | Admitting: Student

## 2023-04-28 DIAGNOSIS — M79674 Pain in right toe(s): Secondary | ICD-10-CM

## 2023-04-28 DIAGNOSIS — K219 Gastro-esophageal reflux disease without esophagitis: Secondary | ICD-10-CM | POA: Diagnosis not present

## 2023-04-28 DIAGNOSIS — M84377A Stress fracture, right toe(s), initial encounter for fracture: Secondary | ICD-10-CM | POA: Diagnosis not present

## 2023-04-28 DIAGNOSIS — S92512A Displaced fracture of proximal phalanx of left lesser toe(s), initial encounter for closed fracture: Secondary | ICD-10-CM | POA: Diagnosis not present

## 2023-04-28 DIAGNOSIS — S92511A Displaced fracture of proximal phalanx of right lesser toe(s), initial encounter for closed fracture: Secondary | ICD-10-CM

## 2023-04-28 DIAGNOSIS — D509 Iron deficiency anemia, unspecified: Secondary | ICD-10-CM | POA: Diagnosis not present

## 2023-04-28 DIAGNOSIS — M79675 Pain in left toe(s): Secondary | ICD-10-CM

## 2023-04-28 DIAGNOSIS — E1165 Type 2 diabetes mellitus with hyperglycemia: Secondary | ICD-10-CM | POA: Diagnosis not present

## 2023-04-28 DIAGNOSIS — I10 Essential (primary) hypertension: Secondary | ICD-10-CM | POA: Diagnosis not present

## 2023-04-28 NOTE — Progress Notes (Addendum)
Chief Complaint: Right foot pain     History of Present Illness:    [name] is a 62 y.o. female presenting today for follow-up evaluation of right foot pain due to 1/5 proximal phalanx fracture.  She states that the injury occurred approximately 1 week ago when she ran her foot into a coffee table on accident.  She was seen by urgent care and placed in a walking boot.  States that the foot is still painful and swollen, particularly with weightbearing.  Occasionally has some numbness in the lateral foot.  She takes hydrocodone acetaminophen 5-325 mg a few times daily for low back pain and has been icing.  Surgical History:   None  PMH/PSH/Family History/Social History/Meds/Allergies:    Past Medical History:  Diagnosis Date   Allergy    rhinitis   Anemia    Arthritis    Asthma    Diabetes mellitus without complication (HCC)    Headache(784.0)    History of stomach ulcers    Hypertension    Internal hemorrhoids    Past Surgical History:  Procedure Laterality Date   CARPAL TUNNEL RELEASE Right    TUBAL LIGATION     Social History   Socioeconomic History   Marital status: Married    Spouse name: Izora Gala   Number of children: 4   Years of education: 12   Highest education level: Not on file  Occupational History   Occupation: disabilty  Tobacco Use   Smoking status: Never    Passive exposure: Yes   Smokeless tobacco: Never   Tobacco comments:    husband smokes outside and inside the home  Vaping Use   Vaping Use: Never used  Substance and Sexual Activity   Alcohol use: No    Alcohol/week: 0.0 standard drinks of alcohol   Drug use: No   Sexual activity: Yes    Birth control/protection: Post-menopausal, Surgical  Other Topics Concern   Not on file  Social History Narrative   Lives at home w/ husband   Right handed   Drinks caffeine once a week    Social Determinants of Health   Financial Resource Strain: Low Risk   (09/09/2022)   Overall Financial Resource Strain (CARDIA)    Difficulty of Paying Living Expenses: Not hard at all  Food Insecurity: No Food Insecurity (09/09/2022)   Hunger Vital Sign    Worried About Running Out of Food in the Last Year: Never true    Ran Out of Food in the Last Year: Never true  Transportation Needs: No Transportation Needs (09/09/2022)   PRAPARE - Administrator, Civil Service (Medical): No    Lack of Transportation (Non-Medical): No  Physical Activity: Sufficiently Active (09/09/2022)   Exercise Vital Sign    Days of Exercise per Week: 5 days    Minutes of Exercise per Session: 30 min  Stress: No Stress Concern Present (09/09/2022)   [employer] of Occupational Health - Occupational Stress Questionnaire    Feeling of Stress : Not at all  Social Connections: Socially Integrated (09/09/2022)   Social Connection and Isolation Panel [NHANES]    Frequency of Communication with Friends and Family: More than three times a week    Frequency of Social Gatherings with Friends and Family: More than three times a week  Attends Religious Services: More than 4 times per year    Active Member of Clubs or Organizations: Yes    Attends Engineer, structural: More than 4 times per year    Marital Status: Married   Family History  Problem Relation Age of Onset   Arthritis Other    Diabetes Other    Hypertension Other    Breast cancer Other    Hypertension Mother    Diabetes Sister    Hypertension Sister    Breast cancer Daughter    Cancer Neg Hx    Early death Neg Hx    Hearing loss Neg Hx    Heart disease Neg Hx    Hyperlipidemia Neg Hx    Kidney disease Neg Hx    Stroke Neg Hx    Colon cancer Neg Hx    Allergies  Allergen Reactions   Verapamil Swelling   Current Outpatient Medications  Medication Sig Dispense Refill   Accu-Chek FastClix Lancets MISC USE 1  TO CHECK GLUCOSE TWICE DAILY 102 each 3   albuterol (PROVENTIL HFA;VENTOLIN HFA)  108 (90 Base) MCG/ACT inhaler Inhale 2 puffs into the lungs every 4 (four) hours as needed for wheezing or shortness of breath (cough, shortness of breath or wheezing.). 1 Inhaler 1   alum & mag hydroxide-simeth (MAALOX MAX) 400-400-40 MG/5ML suspension Take 10 mLs by mouth every 6 (six) hours as needed for indigestion. 355 mL 0   blood glucose meter kit and supplies KIT Use to test blood sugar twice daily. DX: E11.8 1 each 0   budesonide-formoterol (SYMBICORT) 80-4.5 MCG/ACT inhaler Inhale 2 puffs into the lungs 2 (two) times daily. 1 each 5   Cholecalciferol (VITAMIN D3) 25 MCG (1000 UT) CAPS Take 1 capsule (1,000 Units total) by mouth daily. 90 capsule 3   empagliflozin (JARDIANCE) 10 MG TABS tablet Take 1 tablet (10 mg total) by mouth daily before breakfast. 90 tablet 0   famotidine (PEPCID) 20 MG tablet Take 1 tablet (20 mg total) by mouth 2 (two) times daily. 60 tablet 2   glucose blood (ACCU-CHEK GUIDE) test strip Use as instructed twice daily. 300 each 1   HYDROcodone-acetaminophen (NORCO/VICODIN) 5-325 MG tablet Take 1 tablet by mouth every 6 (six) hours as needed for severe pain. 60 tablet 0   L-Methylfolate-Algae-B12-B6 (METANX) 3-90.314-2-35 MG CAPS Take 1 capsule by mouth 2 (two) times daily. (Patient not taking: Reported on 12/17/2022) 180 capsule 3   levocetirizine (XYZAL) 5 MG tablet TAKE 1 TABLET BY MOUTH ONCE DAILY IN THE EVENING 90 tablet 0   omeprazole (PRILOSEC) 20 MG capsule Take 1 capsule (20 mg total) by mouth daily. 30 capsule 0   ondansetron (ZOFRAN-ODT) 4 MG disintegrating tablet Take 1 tablet (4 mg total) by mouth every 8 (eight) hours as needed. 20 tablet 0   pantoprazole (PROTONIX) 40 MG tablet Take 1 tablet (40 mg total) by mouth daily. 30 tablet 3   potassium chloride (KLOR-CON) 10 MEQ tablet Take 1 tablet by mouth twice daily 180 tablet 0   pregabalin (LYRICA) 50 MG capsule Take 1 capsule (50 mg total) by mouth at bedtime. (Patient not taking: Reported on 12/17/2022) 90  capsule 0   rosuvastatin (CRESTOR) 20 MG tablet Take 1 tablet by mouth once daily 90 tablet 0   sucralfate (CARAFATE) 1 g tablet Take 1 tablet (1 g total) by mouth 4 (four) times daily. (Patient not taking: Reported on 12/17/2022) 120 tablet 0   tiZANidine (ZANAFLEX) 4 MG tablet TAKE  1 TABLET BY MOUTH ONCE DAILY AS NEEDED FOR MUSCLE SPASM (Patient not taking: Reported on 12/17/2022) 30 tablet 0   triamcinolone cream (KENALOG) 0.5 % APPLY ONE APPLICATION TOPICALLY THREE TIMES DAILY. 225 g 0   triamterene-hydrochlorothiazide (DYAZIDE) 37.5-25 MG capsule Take 1 capsule by mouth once daily 90 capsule 0   vitamin B-12 (CYANOCOBALAMIN) 1000 MCG tablet Take 1 tablet (1,000 mcg total) by mouth daily. 90 tablet 3   No current facility-administered medications for this visit.   No results found.  Review of Systems:   A ROS was performed including pertinent positives and negatives as documented in the HPI.  Physical Exam :   Constitutional: NAD and appears stated age Neurological: Alert and oriented Psych: Appropriate affect and cooperative Last menstrual period 05/17/2012.   Comprehensive Musculoskeletal Exam:    Mild swelling noted over the superior and lateral aspect of the right foot.  Tenderness to palpation over the fifth toe and into the fifth metatarsal.  Able to flex and extend all 5 toes.  Mild tenderness of the ankle with full range of motion.  DP pulse palpable and 2+.  Neurosensory exam intact.  Imaging:   Xray (right fifth toe 3 views): Mildly displaced fifth proximal phalanx fracture with valgus angulation   I personally reviewed and interpreted the radiographs.   Assessment:   62 y.o. female 1 week status post right fifth proximal phalanx fracture of the foot.  Overall this does appear mildly displaced and I would recommend proceeding with conservative management.  She is already in a walking boot and takes Norco for pre-existing back pain.  Recommend continuing with icing as well  as elevation when able.  She can weight-bear as tolerated in the boot and discussed that should she be pain-free in a few weeks can begin weightbearing without the boot as tolerated.  I would like to see her back in 4 weeks for reevaluation and to assess healing with x-ray.  Plan :    -Return to clinic in 4 weeks     I personally saw and evaluated the patient, and participated in the management and treatment plan.  Hazle Nordmann, PA-C Orthopedics  This document was dictated using Conservation officer, historic buildings. A reasonable attempt at proof reading has been made to minimize errors.

## 2023-05-06 ENCOUNTER — Telehealth (HOSPITAL_BASED_OUTPATIENT_CLINIC_OR_DEPARTMENT_OTHER): Payer: Self-pay | Admitting: Student

## 2023-05-06 NOTE — Telephone Encounter (Signed)
Patient has questions about her fractured toe. Best contact number [phone]

## 2023-05-06 NOTE — Telephone Encounter (Signed)
Spoke to pt concerning her walking boot as is seems to be aggravating her foot. Gave her some instructions on how to remedy this and if she continues to have problems I can have her come in to clinic next week.

## 2023-05-09 ENCOUNTER — Ambulatory Visit (INDEPENDENT_AMBULATORY_CARE_PROVIDER_SITE_OTHER): Payer: Medicare Other | Admitting: Student

## 2023-05-09 ENCOUNTER — Encounter (HOSPITAL_BASED_OUTPATIENT_CLINIC_OR_DEPARTMENT_OTHER): Payer: Self-pay | Admitting: Student

## 2023-05-09 DIAGNOSIS — S92512A Displaced fracture of proximal phalanx of left lesser toe(s), initial encounter for closed fracture: Secondary | ICD-10-CM

## 2023-05-09 DIAGNOSIS — S92511D Displaced fracture of proximal phalanx of right lesser toe(s), subsequent encounter for fracture with routine healing: Secondary | ICD-10-CM

## 2023-05-09 NOTE — Progress Notes (Addendum)
Chief Complaint: Right foot pain     History of Present Illness:   05/09/23: Patient presents today for follow-up of right fifth proximal phalanx fracture.  She has been wearing a walking boot however says that this has been very irritating around the fracture site on the outside of her foot.  This is particularly worse when walking.  She feels more comfortable and less painful when out of the boot.  She has been icing intermittently but continues to notice swelling.   04/28/23: [name] is a 62 y.o. female presenting today for follow-up evaluation of right foot pain due to fifth proximal phalanx fracture.  She states that the injury occurred approximately 1 week ago when she ran her foot into a coffee table on accident.  She was seen by urgent care and placed in a walking boot.  States that the foot is still painful and swollen, particularly with weightbearing.  Occasionally has some numbness in the lateral foot.  She takes hydrocodone acetaminophen 5-325 mg a few times daily for low back pain and has been icing.  Surgical History:   None  PMH/PSH/Family History/Social History/Meds/Allergies:    Past Medical History:  Diagnosis Date   Allergy    rhinitis   Anemia    Arthritis    Asthma    Diabetes mellitus without complication (HCC)    Headache(784.0)    History of stomach ulcers    Hypertension    Internal hemorrhoids    Past Surgical History:  Procedure Laterality Date   CARPAL TUNNEL RELEASE Right    TUBAL LIGATION     Social History   Socioeconomic History   Marital status: Married    Spouse name: Izora Gala   Number of children: 4   Years of education: 12   Highest education level: Not on file  Occupational History   Occupation: disabilty  Tobacco Use   Smoking status: Never    Passive exposure: Yes   Smokeless tobacco: Never   Tobacco comments:    husband smokes outside and inside the home  Vaping Use   Vaping Use:  Never used  Substance and Sexual Activity   Alcohol use: No    Alcohol/week: 0.0 standard drinks of alcohol   Drug use: No   Sexual activity: Yes    Birth control/protection: Post-menopausal, Surgical  Other Topics Concern   Not on file  Social History Narrative   Lives at home w/ husband   Right handed   Drinks caffeine once a week    Social Determinants of Health   Financial Resource Strain: Low Risk  (09/09/2022)   Overall Financial Resource Strain (CARDIA)    Difficulty of Paying Living Expenses: Not hard at all  Food Insecurity: No Food Insecurity (09/09/2022)   Hunger Vital Sign    Worried About Running Out of Food in the Last Year: Never true    Ran Out of Food in the Last Year: Never true  Transportation Needs: No Transportation Needs (09/09/2022)   PRAPARE - Administrator, Civil Service (Medical): No    Lack of Transportation (Non-Medical): No  Physical Activity: Sufficiently Active (09/09/2022)   Exercise Vital Sign    Days of Exercise per Week: 5 days    Minutes of Exercise per Session: 30 min  Stress: No Stress Concern  Present (09/09/2022)   [employer] of Occupational Health - Occupational Stress Questionnaire    Feeling of Stress : Not at all  Social Connections: Socially Integrated (09/09/2022)   Social Connection and Isolation Panel [NHANES]    Frequency of Communication with Friends and Family: More than three times a week    Frequency of Social Gatherings with Friends and Family: More than three times a week    Attends Religious Services: More than 4 times per year    Active Member of [employer] or Organizations: Yes    Attends Engineer, structural: More than 4 times per year    Marital Status: Married   Family History  Problem Relation Age of Onset   Arthritis Other    Diabetes Other    Hypertension Other    Breast cancer Other    Hypertension Mother    Diabetes Sister    Hypertension Sister    Breast cancer Daughter    Cancer  Neg Hx    Early death Neg Hx    Hearing loss Neg Hx    Heart disease Neg Hx    Hyperlipidemia Neg Hx    Kidney disease Neg Hx    Stroke Neg Hx    Colon cancer Neg Hx    Allergies  Allergen Reactions   Verapamil Swelling   Current Outpatient Medications  Medication Sig Dispense Refill   Accu-Chek FastClix Lancets MISC USE 1  TO CHECK GLUCOSE TWICE DAILY 102 each 3   albuterol (PROVENTIL HFA;VENTOLIN HFA) 108 (90 Base) MCG/ACT inhaler Inhale 2 puffs into the lungs every 4 (four) hours as needed for wheezing or shortness of breath (cough, shortness of breath or wheezing.). 1 Inhaler 1   alum & mag hydroxide-simeth (MAALOX MAX) 400-400-40 MG/5ML suspension Take 10 mLs by mouth every 6 (six) hours as needed for indigestion. 355 mL 0   blood glucose meter kit and supplies KIT Use to test blood sugar twice daily. DX: E11.8 1 each 0   budesonide-formoterol (SYMBICORT) 80-4.5 MCG/ACT inhaler Inhale 2 puffs into the lungs 2 (two) times daily. 1 each 5   Cholecalciferol (VITAMIN D3) 25 MCG (1000 UT) CAPS Take 1 capsule (1,000 Units total) by mouth daily. 90 capsule 3   empagliflozin (JARDIANCE) 10 MG TABS tablet Take 1 tablet (10 mg total) by mouth daily before breakfast. 90 tablet 0   famotidine (PEPCID) 20 MG tablet Take 1 tablet (20 mg total) by mouth 2 (two) times daily. 60 tablet 2   glucose blood (ACCU-CHEK GUIDE) test strip Use as instructed twice daily. 300 each 1   HYDROcodone-acetaminophen (NORCO/VICODIN) 5-325 MG tablet Take 1 tablet by mouth every 6 (six) hours as needed for severe pain. 60 tablet 0   L-Methylfolate-Algae-B12-B6 (METANX) 3-90.314-2-35 MG CAPS Take 1 capsule by mouth 2 (two) times daily. (Patient not taking: Reported on 12/17/2022) 180 capsule 3   levocetirizine (XYZAL) 5 MG tablet TAKE 1 TABLET BY MOUTH ONCE DAILY IN THE EVENING 90 tablet 0   omeprazole (PRILOSEC) 20 MG capsule Take 1 capsule (20 mg total) by mouth daily. 30 capsule 0   ondansetron (ZOFRAN-ODT) 4 MG  disintegrating tablet Take 1 tablet (4 mg total) by mouth every 8 (eight) hours as needed. 20 tablet 0   pantoprazole (PROTONIX) 40 MG tablet Take 1 tablet (40 mg total) by mouth daily. 30 tablet 3   potassium chloride (KLOR-CON) 10 MEQ tablet Take 1 tablet by mouth twice daily 180 tablet 0   pregabalin (LYRICA) 50 MG  capsule Take 1 capsule (50 mg total) by mouth at bedtime. (Patient not taking: Reported on 12/17/2022) 90 capsule 0   rosuvastatin (CRESTOR) 20 MG tablet Take 1 tablet by mouth once daily 90 tablet 0   sucralfate (CARAFATE) 1 g tablet Take 1 tablet (1 g total) by mouth 4 (four) times daily. (Patient not taking: Reported on 12/17/2022) 120 tablet 0   tiZANidine (ZANAFLEX) 4 MG tablet TAKE 1 TABLET BY MOUTH ONCE DAILY AS NEEDED FOR MUSCLE SPASM (Patient not taking: Reported on 12/17/2022) 30 tablet 0   triamcinolone cream (KENALOG) 0.5 % APPLY ONE APPLICATION TOPICALLY THREE TIMES DAILY. 225 g 0   triamterene-hydrochlorothiazide (DYAZIDE) 37.5-25 MG capsule Take 1 capsule by mouth once daily 90 capsule 0   vitamin B-12 (CYANOCOBALAMIN) 1000 MCG tablet Take 1 tablet (1,000 mcg total) by mouth daily. 90 tablet 3   No current facility-administered medications for this visit.   No results found.  Review of Systems:   A ROS was performed including pertinent positives and negatives as documented in the HPI.  Physical Exam :   Constitutional: NAD and appears stated age Neurological: Alert and oriented Psych: Appropriate affect and cooperative Last menstrual period 05/17/2012.   Comprehensive Musculoskeletal Exam:    Tenderness around the fifth PIP and MTP joints of the right foot.  Mild swelling around this area.  Full ankle range of motion with flexion and extension.  DP pulse 2+.  Neurosensory exam intact.   Imaging:      Assessment:   62 y.o. female approximately 2.5 weeks status post proximal phalanx fracture of the right fifth toe.  Her main complaint today is that the walking  boot has been rubbing against the side of her foot causing increased pain and irritation.  I will exchange the boot for a postop shoe today to continue in for the next few weeks.  Patient was much more comfortable with this at rest and with weightbearing.  Continue icing and and current pain medication regimen.  I will plan on seeing her back in a few weeks for reassessment and x-ray to assess healing.   Plan :    -Return to clinic on 7/24 as scheduled     I personally saw and evaluated the patient, and participated in the management and treatment plan.  Hazle Nordmann, PA-C Orthopedics  This document was dictated using Conservation officer, historic buildings. A reasonable attempt at proof reading has been made to minimize errors.

## 2023-05-17 ENCOUNTER — Ambulatory Visit: Payer: Medicare Other | Admitting: Internal Medicine

## 2023-06-01 ENCOUNTER — Ambulatory Visit (INDEPENDENT_AMBULATORY_CARE_PROVIDER_SITE_OTHER): Payer: Medicare Other

## 2023-06-01 ENCOUNTER — Encounter (HOSPITAL_BASED_OUTPATIENT_CLINIC_OR_DEPARTMENT_OTHER): Payer: Self-pay | Admitting: Student

## 2023-06-01 ENCOUNTER — Other Ambulatory Visit (HOSPITAL_BASED_OUTPATIENT_CLINIC_OR_DEPARTMENT_OTHER): Payer: Self-pay | Admitting: Student

## 2023-06-01 ENCOUNTER — Ambulatory Visit (INDEPENDENT_AMBULATORY_CARE_PROVIDER_SITE_OTHER): Payer: Medicare Other | Admitting: Student

## 2023-06-01 DIAGNOSIS — S92511D Displaced fracture of proximal phalanx of right lesser toe(s), subsequent encounter for fracture with routine healing: Secondary | ICD-10-CM | POA: Diagnosis not present

## 2023-06-01 DIAGNOSIS — S92512D Displaced fracture of proximal phalanx of left lesser toe(s), subsequent encounter for fracture with routine healing: Secondary | ICD-10-CM | POA: Diagnosis not present

## 2023-06-01 DIAGNOSIS — S92501A Displaced unspecified fracture of right lesser toe(s), initial encounter for closed fracture: Secondary | ICD-10-CM

## 2023-06-01 DIAGNOSIS — S92512A Displaced fracture of proximal phalanx of left lesser toe(s), initial encounter for closed fracture: Secondary | ICD-10-CM

## 2023-06-01 NOTE — Progress Notes (Addendum)
Chief Complaint: Right foot pain     History of Present Illness:   06/01/23: [name] presents today for follow-up of a right fifth toe fracture that occurred about 6 weeks ago.  Overall she reports doing much better.  Pain levels are mild and well-controlled.  She has been continuing to wear the surgical shoe.  She has tried weaning out of it into a normal shoe however this did cause some discomfort.  Denies any further concerns at this time.   04/28/23: [name] is a 62 y.o. female presenting today for follow-up evaluation of right foot pain due to fifth proximal phalanx fracture.  She states that the injury occurred approximately 1 week ago when she ran her foot into a coffee table on accident.  She was seen by urgent care and placed in a walking boot.  States that the foot is still painful and swollen, particularly with weightbearing.  Occasionally has some numbness in the lateral foot.  She takes hydrocodone acetaminophen 5-325 mg a few times daily for low back pain and has been icing.  Surgical History:   None  PMH/PSH/Family History/Social History/Meds/Allergies:    Past Medical History:  Diagnosis Date   Allergy    rhinitis   Anemia    Arthritis    Asthma    Diabetes mellitus without complication (HCC)    Headache(784.0)    History of stomach ulcers    Hypertension    Internal hemorrhoids    Past Surgical History:  Procedure Laterality Date   CARPAL TUNNEL RELEASE Right    TUBAL LIGATION     Social History   Socioeconomic History   Marital status: Married    Spouse name: Izora Gala   Number of children: 4   Years of education: 12   Highest education level: Not on file  Occupational History   Occupation: disabilty  Tobacco Use   Smoking status: Never    Passive exposure: Yes   Smokeless tobacco: Never   Tobacco comments:    husband smokes outside and inside the home  Vaping Use   Vaping status: Never Used  Substance and  Sexual Activity   Alcohol use: No    Alcohol/week: 0.0 standard drinks of alcohol   Drug use: No   Sexual activity: Yes    Birth control/protection: Post-menopausal, Surgical  Other Topics Concern   Not on file  Social History Narrative   Lives at home w/ husband   Right handed   Drinks caffeine once a week    Social Determinants of Health   Financial Resource Strain: Low Risk  (09/09/2022)   Overall Financial Resource Strain (CARDIA)    Difficulty of Paying Living Expenses: Not hard at all  Food Insecurity: No Food Insecurity (09/09/2022)   Hunger Vital Sign    Worried About Running Out of Food in the Last Year: Never true    Ran Out of Food in the Last Year: Never true  Transportation Needs: No Transportation Needs (09/09/2022)   PRAPARE - Administrator, Civil Service (Medical): No    Lack of Transportation (Non-Medical): No  Physical Activity: Sufficiently Active (09/09/2022)   Exercise Vital Sign    Days of Exercise per Week: 5 days    Minutes of Exercise per Session: 30 min  Stress: No Stress Concern Present (  09/09/2022)   [employer] of Occupational Health - Occupational Stress Questionnaire    Feeling of Stress : Not at all  Social Connections: Socially Integrated (09/09/2022)   Social Connection and Isolation Panel [NHANES]    Frequency of Communication with Friends and Family: More than three times a week    Frequency of Social Gatherings with Friends and Family: More than three times a week    Attends Religious Services: More than 4 times per year    Active Member of [employer] or Organizations: Yes    Attends Engineer, structural: More than 4 times per year    Marital Status: Married   Family History  Problem Relation Age of Onset   Arthritis Other    Diabetes Other    Hypertension Other    Breast cancer Other    Hypertension Mother    Diabetes Sister    Hypertension Sister    Breast cancer Daughter    Cancer Neg Hx    Early death Neg Hx     Hearing loss Neg Hx    Heart disease Neg Hx    Hyperlipidemia Neg Hx    Kidney disease Neg Hx    Stroke Neg Hx    Colon cancer Neg Hx    Allergies  Allergen Reactions   Verapamil Swelling   Current Outpatient Medications  Medication Sig Dispense Refill   Accu-Chek FastClix Lancets MISC USE 1  TO CHECK GLUCOSE TWICE DAILY 102 each 3   albuterol (PROVENTIL HFA;VENTOLIN HFA) 108 (90 Base) MCG/ACT inhaler Inhale 2 puffs into the lungs every 4 (four) hours as needed for wheezing or shortness of breath (cough, shortness of breath or wheezing.). 1 Inhaler 1   alum & mag hydroxide-simeth (MAALOX MAX) 400-400-40 MG/5ML suspension Take 10 mLs by mouth every 6 (six) hours as needed for indigestion. 355 mL 0   blood glucose meter kit and supplies KIT Use to test blood sugar twice daily. DX: E11.8 1 each 0   budesonide-formoterol (SYMBICORT) 80-4.5 MCG/ACT inhaler Inhale 2 puffs into the lungs 2 (two) times daily. 1 each 5   Cholecalciferol (VITAMIN D3) 25 MCG (1000 UT) CAPS Take 1 capsule (1,000 Units total) by mouth daily. 90 capsule 3   empagliflozin (JARDIANCE) 10 MG TABS tablet Take 1 tablet (10 mg total) by mouth daily before breakfast. 90 tablet 0   famotidine (PEPCID) 20 MG tablet Take 1 tablet (20 mg total) by mouth 2 (two) times daily. 60 tablet 2   glucose blood (ACCU-CHEK GUIDE) test strip Use as instructed twice daily. 300 each 1   HYDROcodone-acetaminophen (NORCO/VICODIN) 5-325 MG tablet Take 1 tablet by mouth every 6 (six) hours as needed for severe pain. 60 tablet 0   L-Methylfolate-Algae-B12-B6 (METANX) 3-90.314-2-35 MG CAPS Take 1 capsule by mouth 2 (two) times daily. (Patient not taking: Reported on 12/17/2022) 180 capsule 3   levocetirizine (XYZAL) 5 MG tablet TAKE 1 TABLET BY MOUTH ONCE DAILY IN THE EVENING 90 tablet 0   omeprazole (PRILOSEC) 20 MG capsule Take 1 capsule (20 mg total) by mouth daily. 30 capsule 0   ondansetron (ZOFRAN-ODT) 4 MG disintegrating tablet Take 1 tablet  (4 mg total) by mouth every 8 (eight) hours as needed. 20 tablet 0   pantoprazole (PROTONIX) 40 MG tablet Take 1 tablet (40 mg total) by mouth daily. 30 tablet 3   potassium chloride (KLOR-CON) 10 MEQ tablet Take 1 tablet by mouth twice daily 180 tablet 0   pregabalin (LYRICA) 50 MG capsule  Take 1 capsule (50 mg total) by mouth at bedtime. (Patient not taking: Reported on 12/17/2022) 90 capsule 0   rosuvastatin (CRESTOR) 20 MG tablet Take 1 tablet by mouth once daily 90 tablet 0   sucralfate (CARAFATE) 1 g tablet Take 1 tablet (1 g total) by mouth 4 (four) times daily. (Patient not taking: Reported on 12/17/2022) 120 tablet 0   tiZANidine (ZANAFLEX) 4 MG tablet TAKE 1 TABLET BY MOUTH ONCE DAILY AS NEEDED FOR MUSCLE SPASM (Patient not taking: Reported on 12/17/2022) 30 tablet 0   triamcinolone cream (KENALOG) 0.5 % APPLY ONE APPLICATION TOPICALLY THREE TIMES DAILY. 225 g 0   triamterene-hydrochlorothiazide (DYAZIDE) 37.5-25 MG capsule Take 1 capsule by mouth once daily 90 capsule 0   vitamin B-12 (CYANOCOBALAMIN) 1000 MCG tablet Take 1 tablet (1,000 mcg total) by mouth daily. 90 tablet 3   No current facility-administered medications for this visit.   No results found.  Review of Systems:   A ROS was performed including pertinent positives and negatives as documented in the HPI.  Physical Exam :   Constitutional: NAD and appears stated age Neurological: Alert and oriented Psych: Appropriate affect and cooperative Last menstrual period 05/17/2012.   Comprehensive Musculoskeletal Exam:    No swelling or erythema of the right foot or fifth toe.  Dorsal aspect of the PIP and MTP joints is mildly tender with palpation.  Full ankle active range of motion without any discomfort.  DP pulse 2+.    Imaging:   Xray (right fifth toe 3 views) Fifth proximal phalanx fracture with good approximation and callus formation  I personally reviewed and interpreted the radiographs.   Assessment:   62 y.o.  female 6 weeks status post right fifth proximal phalanx fracture.  Radiographs taken today do show good callus formation surrounding the fracture.  I did recommend that she could continue wearing the surgical shoe for another 1 to 2 weeks but then can begin weaning back into a normal shoe as tolerated.  Patient is set to go on vacation toward the end of August so I would like to see her back once before just to assess once more and ensure full return to activities.  Plan :    -Return to clinic in 4 weeks for last recheck     I personally saw and evaluated the patient, and participated in the management and treatment plan.  Hazle Nordmann, PA-C Orthopedics  This document was dictated using Conservation officer, historic buildings. A reasonable attempt at proof reading has been made to minimize errors.

## 2023-06-23 DIAGNOSIS — K219 Gastro-esophageal reflux disease without esophagitis: Secondary | ICD-10-CM | POA: Diagnosis not present

## 2023-06-23 DIAGNOSIS — G2581 Restless legs syndrome: Secondary | ICD-10-CM | POA: Diagnosis not present

## 2023-06-23 DIAGNOSIS — I10 Essential (primary) hypertension: Secondary | ICD-10-CM | POA: Diagnosis not present

## 2023-06-23 DIAGNOSIS — D509 Iron deficiency anemia, unspecified: Secondary | ICD-10-CM | POA: Diagnosis not present

## 2023-06-23 DIAGNOSIS — E1165 Type 2 diabetes mellitus with hyperglycemia: Secondary | ICD-10-CM | POA: Diagnosis not present

## 2023-06-29 ENCOUNTER — Other Ambulatory Visit (HOSPITAL_BASED_OUTPATIENT_CLINIC_OR_DEPARTMENT_OTHER): Payer: Self-pay | Admitting: Student

## 2023-06-29 ENCOUNTER — Ambulatory Visit (HOSPITAL_BASED_OUTPATIENT_CLINIC_OR_DEPARTMENT_OTHER): Payer: Medicare Other

## 2023-06-29 ENCOUNTER — Ambulatory Visit (HOSPITAL_BASED_OUTPATIENT_CLINIC_OR_DEPARTMENT_OTHER): Payer: Medicare Other | Admitting: Student

## 2023-06-29 DIAGNOSIS — S92511D Displaced fracture of proximal phalanx of right lesser toe(s), subsequent encounter for fracture with routine healing: Secondary | ICD-10-CM

## 2023-06-29 DIAGNOSIS — S92501A Displaced unspecified fracture of right lesser toe(s), initial encounter for closed fracture: Secondary | ICD-10-CM

## 2023-06-29 DIAGNOSIS — S92512D Displaced fracture of proximal phalanx of left lesser toe(s), subsequent encounter for fracture with routine healing: Secondary | ICD-10-CM

## 2023-06-29 NOTE — Progress Notes (Signed)
Chief Complaint: Right foot pain     History of Present Illness:   06/29/23: [name] is 2 months status post proximal phalanx fracture of the right fifth toe.  Overall she reports doing extremely well.  She states that she is having no pain and has transition back into normal shoes without any difficulty.  Denies use of any pain medications.  No numbness, tingling, or swelling of the area.  Does state that she is having some issues with her right hip but otherwise no concerns regarding her right foot.  Surgical History:   None  PMH/PSH/Family History/Social History/Meds/Allergies:    Past Medical History:  Diagnosis Date   Allergy    rhinitis   Anemia    Arthritis    Asthma    Diabetes mellitus without complication (HCC)    Headache(784.0)    History of stomach ulcers    Hypertension    Internal hemorrhoids    Past Surgical History:  Procedure Laterality Date   CARPAL TUNNEL RELEASE Right    TUBAL LIGATION     Social History   Socioeconomic History   Marital status: Married    Spouse name: Izora Gala   Number of children: 4   Years of education: 12   Highest education level: Not on file  Occupational History   Occupation: disabilty  Tobacco Use   Smoking status: Never    Passive exposure: Yes   Smokeless tobacco: Never   Tobacco comments:    husband smokes outside and inside the home  Vaping Use   Vaping status: Never Used  Substance and Sexual Activity   Alcohol use: No    Alcohol/week: 0.0 standard drinks of alcohol   Drug use: No   Sexual activity: Yes    Birth control/protection: Post-menopausal, Surgical  Other Topics Concern   Not on file  Social History Narrative   Lives at home w/ husband   Right handed   Drinks caffeine once a week    Social Determinants of Health   Financial Resource Strain: Low Risk  (09/09/2022)   Overall Financial Resource Strain (CARDIA)    Difficulty of Paying Living Expenses: Not hard at  all  Food Insecurity: No Food Insecurity (09/09/2022)   Hunger Vital Sign    Worried About Running Out of Food in the Last Year: Never true    Ran Out of Food in the Last Year: Never true  Transportation Needs: No Transportation Needs (09/09/2022)   PRAPARE - Administrator, Civil Service (Medical): No    Lack of Transportation (Non-Medical): No  Physical Activity: Sufficiently Active (09/09/2022)   Exercise Vital Sign    Days of Exercise per Week: 5 days    Minutes of Exercise per Session: 30 min  Stress: No Stress Concern Present (09/09/2022)   [employer] of Occupational Health - Occupational Stress Questionnaire    Feeling of Stress : Not at all  Social Connections: Socially Integrated (09/09/2022)   Social Connection and Isolation Panel [NHANES]    Frequency of Communication with Friends and Family: More than three times a week    Frequency of Social Gatherings with Friends and Family: More than three times a week    Attends Religious Services: More than 4 times per year    Active Member of [employer] or Organizations: Yes  Attends Engineer, structural: More than 4 times per year    Marital Status: Married   Family History  Problem Relation Age of Onset   Arthritis Other    Diabetes Other    Hypertension Other    Breast cancer Other    Hypertension Mother    Diabetes Sister    Hypertension Sister    Breast cancer Daughter    Cancer Neg Hx    Early death Neg Hx    Hearing loss Neg Hx    Heart disease Neg Hx    Hyperlipidemia Neg Hx    Kidney disease Neg Hx    Stroke Neg Hx    Colon cancer Neg Hx    Allergies  Allergen Reactions   Verapamil Swelling   Current Outpatient Medications  Medication Sig Dispense Refill   Accu-Chek FastClix Lancets MISC USE 1  TO CHECK GLUCOSE TWICE DAILY 102 each 3   albuterol (PROVENTIL HFA;VENTOLIN HFA) 108 (90 Base) MCG/ACT inhaler Inhale 2 puffs into the lungs every 4 (four) hours as needed for wheezing or  shortness of breath (cough, shortness of breath or wheezing.). 1 Inhaler 1   alum & mag hydroxide-simeth (MAALOX MAX) 400-400-40 MG/5ML suspension Take 10 mLs by mouth every 6 (six) hours as needed for indigestion. 355 mL 0   blood glucose meter kit and supplies KIT Use to test blood sugar twice daily. DX: E11.8 1 each 0   budesonide-formoterol (SYMBICORT) 80-4.5 MCG/ACT inhaler Inhale 2 puffs into the lungs 2 (two) times daily. 1 each 5   Cholecalciferol (VITAMIN D3) 25 MCG (1000 UT) CAPS Take 1 capsule (1,000 Units total) by mouth daily. 90 capsule 3   empagliflozin (JARDIANCE) 10 MG TABS tablet Take 1 tablet (10 mg total) by mouth daily before breakfast. 90 tablet 0   famotidine (PEPCID) 20 MG tablet Take 1 tablet (20 mg total) by mouth 2 (two) times daily. 60 tablet 2   glucose blood (ACCU-CHEK GUIDE) test strip Use as instructed twice daily. 300 each 1   HYDROcodone-acetaminophen (NORCO/VICODIN) 5-325 MG tablet Take 1 tablet by mouth every 6 (six) hours as needed for severe pain. 60 tablet 0   L-Methylfolate-Algae-B12-B6 (METANX) 3-90.314-2-35 MG CAPS Take 1 capsule by mouth 2 (two) times daily. (Patient not taking: Reported on 12/17/2022) 180 capsule 3   levocetirizine (XYZAL) 5 MG tablet TAKE 1 TABLET BY MOUTH ONCE DAILY IN THE EVENING 90 tablet 0   omeprazole (PRILOSEC) 20 MG capsule Take 1 capsule (20 mg total) by mouth daily. 30 capsule 0   ondansetron (ZOFRAN-ODT) 4 MG disintegrating tablet Take 1 tablet (4 mg total) by mouth every 8 (eight) hours as needed. 20 tablet 0   pantoprazole (PROTONIX) 40 MG tablet Take 1 tablet (40 mg total) by mouth daily. 30 tablet 3   potassium chloride (KLOR-CON) 10 MEQ tablet Take 1 tablet by mouth twice daily 180 tablet 0   pregabalin (LYRICA) 50 MG capsule Take 1 capsule (50 mg total) by mouth at bedtime. (Patient not taking: Reported on 12/17/2022) 90 capsule 0   rosuvastatin (CRESTOR) 20 MG tablet Take 1 tablet by mouth once daily 90 tablet 0   sucralfate  (CARAFATE) 1 g tablet Take 1 tablet (1 g total) by mouth 4 (four) times daily. (Patient not taking: Reported on 12/17/2022) 120 tablet 0   tiZANidine (ZANAFLEX) 4 MG tablet TAKE 1 TABLET BY MOUTH ONCE DAILY AS NEEDED FOR MUSCLE SPASM (Patient not taking: Reported on 12/17/2022) 30 tablet 0  triamcinolone cream (KENALOG) 0.5 % APPLY ONE APPLICATION TOPICALLY THREE TIMES DAILY. 225 g 0   triamterene-hydrochlorothiazide (DYAZIDE) 37.5-25 MG capsule Take 1 capsule by mouth once daily 90 capsule 0   vitamin B-12 (CYANOCOBALAMIN) 1000 MCG tablet Take 1 tablet (1,000 mcg total) by mouth daily. 90 tablet 3   No current facility-administered medications for this visit.   No results found.  Review of Systems:   A ROS was performed including pertinent positives and negatives as documented in the HPI.  Physical Exam :   Constitutional: NAD and appears stated age Neurological: Alert and oriented Psych: Appropriate affect and cooperative Last menstrual period 05/17/2012.   Comprehensive Musculoskeletal Exam:    Patient ambulating with normal gait.  No tenderness throughout the right fifth toe or metatarsal.  Full flexion and extension present of all 5 toes without pain.  Full range of motion of the right ankle.  DP pulse 2+.  Neurosensory exam intact.   Imaging:   Xray (right fifth toe 3 views) Healing fifth proximal phalanx fracture with large callus   I personally reviewed and interpreted the radiographs.   Assessment:   62 y.o. female 2 months status post right fifth proximal phalanx fracture.  She is doing extremely well today and x-rays show adequate callus formation and healing.  She has transitioned well from the postop shoe into normal shoes.  At this point she can return to clinic as needed.  She did discuss some pain in her right hip and I have recommended orthopedic evaluation with our office which she will follow-up on when she is ready.  Plan :    -Return to clinic as  needed     I personally saw and evaluated the patient, and participated in the management and treatment plan.  Hazle Nordmann, PA-C Orthopedics  This document was dictated using Conservation officer, historic buildings. A reasonable attempt at proof reading has been made to minimize errors.

## 2023-07-13 ENCOUNTER — Ambulatory Visit: Payer: Medicare Other | Admitting: Cardiovascular Disease

## 2023-07-28 DIAGNOSIS — E559 Vitamin D deficiency, unspecified: Secondary | ICD-10-CM | POA: Diagnosis not present

## 2023-07-28 DIAGNOSIS — N1832 Chronic kidney disease, stage 3b: Secondary | ICD-10-CM | POA: Diagnosis not present

## 2023-07-28 DIAGNOSIS — E782 Mixed hyperlipidemia: Secondary | ICD-10-CM | POA: Diagnosis not present

## 2023-07-28 DIAGNOSIS — M25552 Pain in left hip: Secondary | ICD-10-CM | POA: Diagnosis not present

## 2023-07-28 DIAGNOSIS — R7303 Prediabetes: Secondary | ICD-10-CM | POA: Diagnosis not present

## 2023-07-28 DIAGNOSIS — I10 Essential (primary) hypertension: Secondary | ICD-10-CM | POA: Diagnosis not present

## 2023-07-28 DIAGNOSIS — D509 Iron deficiency anemia, unspecified: Secondary | ICD-10-CM | POA: Diagnosis not present

## 2023-07-28 NOTE — Progress Notes (Deleted)
Cardiology Office Note:   Date:  07/28/2023  ID:  [name], DOB 1961-03-09, MRN [id number] PCP: [facility]  [facility] Providers Cardiologist:  Jodelle Red, MD {  History of Present Illness:   [name] is a 62 y.o. female who is referred by *** for evaluation of bradycardia.  ***   In 2021 her calcium score was zero.  The LM had 40%.   The circ had 40% stenosis.    ROS: ***  Studies Reviewed:    EKG:       ***  Risk Assessment/Calculations:   {Does this patient have ATRIAL FIBRILLATION?:[phone]} No BP recorded.  {Refresh Note OR Click here to enter BP  :1}***        Physical Exam:   VS:  LMP 05/17/2012    Wt Readings from Last 3 Encounters:  04/21/23 242 lb 1 oz (109.8 kg)  04/03/23 242 lb (109.8 kg)  12/17/22 245 lb 12.8 oz (111.5 kg)     GEN: Well nourished, well developed in no acute distress NECK: No JVD; No carotid bruits CARDIAC: ***RRR, no murmurs, rubs, gallops RESPIRATORY:  Clear to auscultation without rales, wheezing or rhonchi  ABDOMEN: Soft, non-tender, non-distended EXTREMITIES:  No edema; No deformity   ASSESSMENT AND PLAN:   Bradycardia:  ***    {Are you ordering a CV Procedure (e.g. stress test, cath, DCCV, TEE, etc)?   Press F2        :[id number]}  Follow up ***  Signed, Rollene Rotunda, MD

## 2023-07-29 ENCOUNTER — Ambulatory Visit: Payer: Medicare Other | Admitting: Cardiology

## 2023-08-01 IMAGING — MR MR LUMBAR SPINE W/O CM
4 of 5 series · 25 of 48 positions shown · non-contrast
Comparison: Lumbar spine MRI 02/26/2021

CLINICAL DATA: Chronic low back pain and lower extremity
radiculopathy bilaterally

EXAM:
MRI LUMBAR SPINE WITHOUT CONTRAST
TECHNIQUE: Multiplanar, multisequence MR imaging of the lumbar spine was
performed. No intravenous contrast was administered.

[Series 4: T2 · sagittal · 4.0mm · 0.53mm/px · 6 of 15 slices shown (1 of 2)]
[im 1/15]
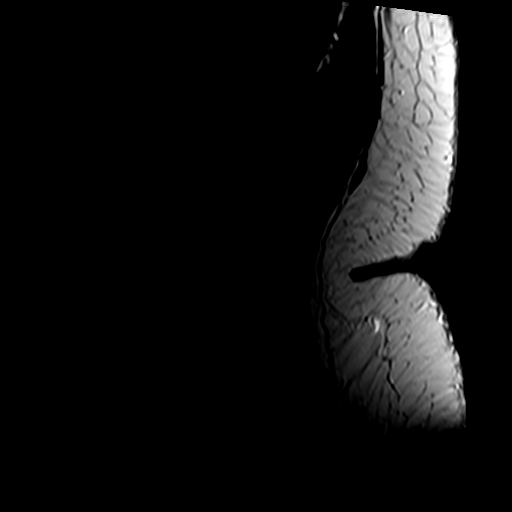
[im 3/15]
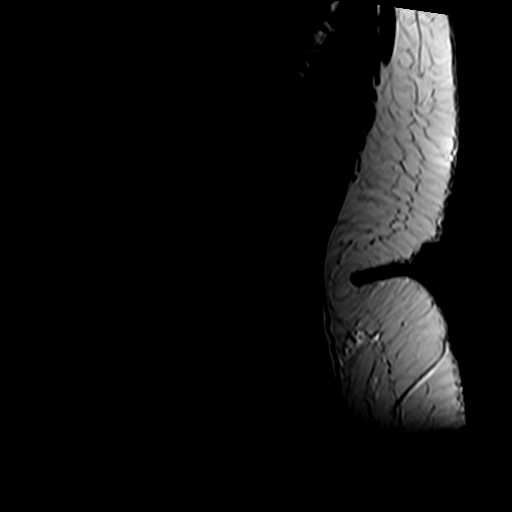
[im 6/15]
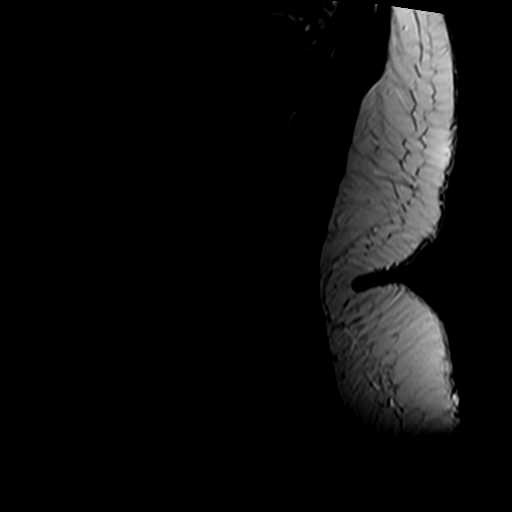
[im 9/15]
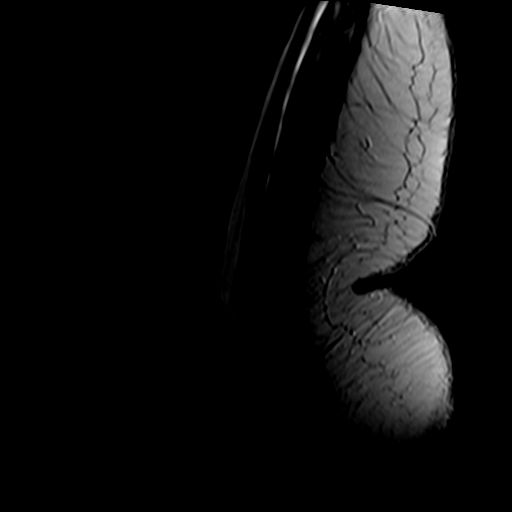
[im 12/15]
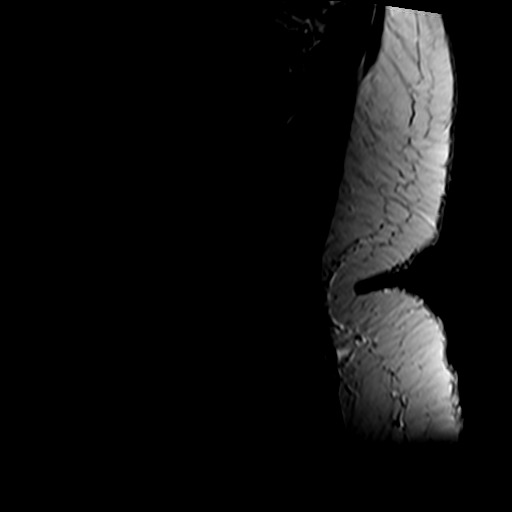
[im 15/15]
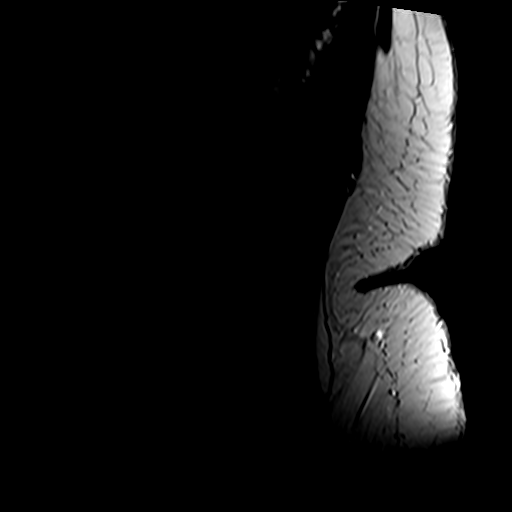

[Series 6: T1 · sagittal · 4.0mm · 0.53mm/px · 6 of 15 slices shown (1 of 2)]
[im 1/15]
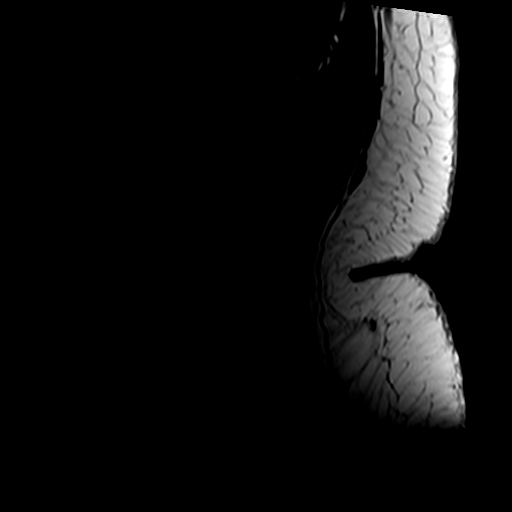
[im 3/15]
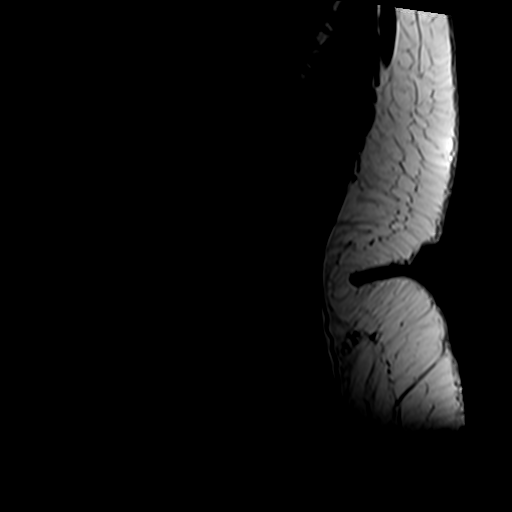
[im 6/15]
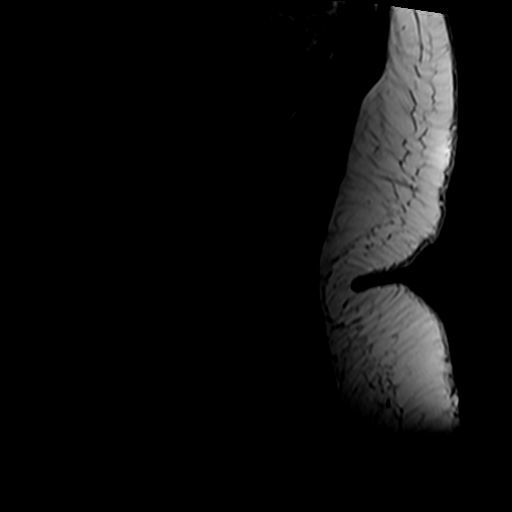
[im 9/15]
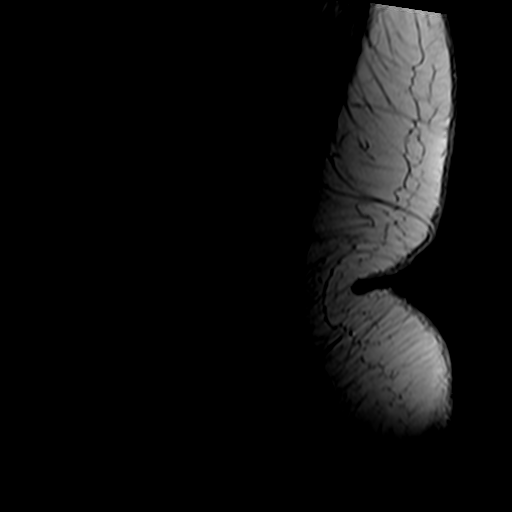
[im 12/15]
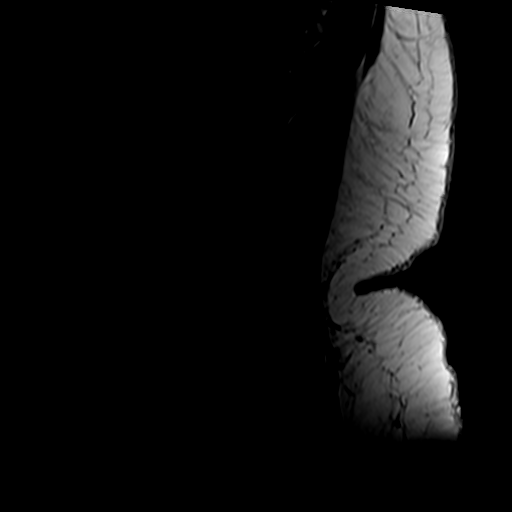
[im 15/15]
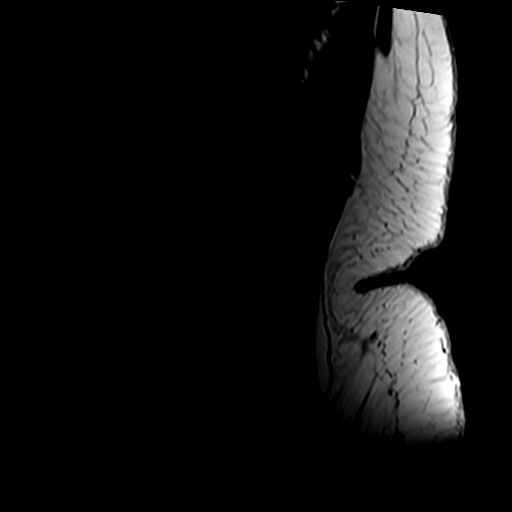

[Series 7: T2 · axial · 4.0mm · 0.70mm/px · z∈[-4,+190]mm · 9 of 35 slices shown (2 of 2)]
[im 1/35]
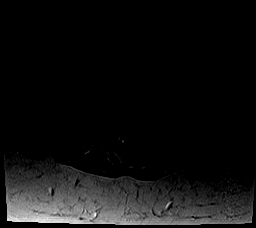
[im 5/35]
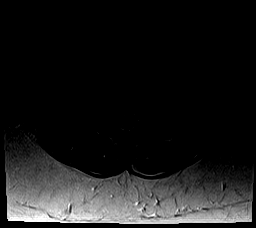
[im 10/35]
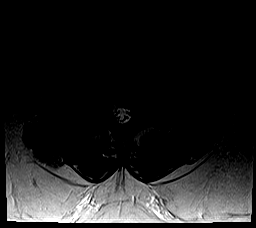
[im 15/35]
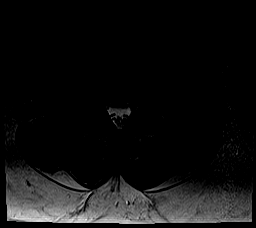
[im 18/35]
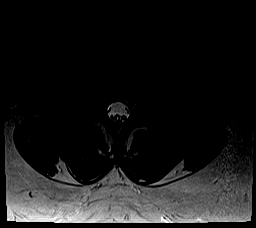
[im 20/35]
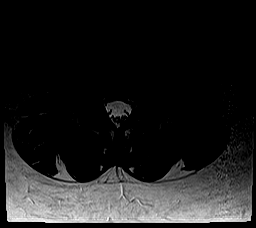
[im 25/35]
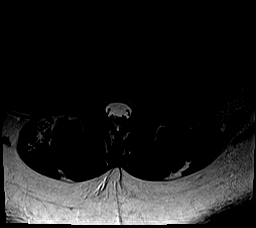
[im 30/35]
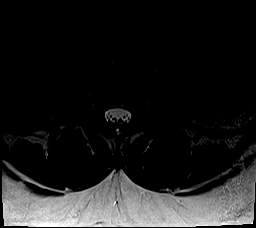
[im 35/35]
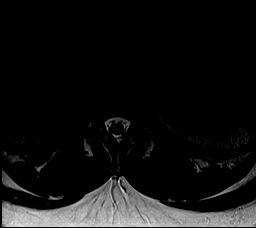

[Series 8: T1 · axial · 4.0mm · 0.35mm/px · z∈[-4,+164]mm · 4 of 35 slices shown (2 of 2)]
[im 1/35]
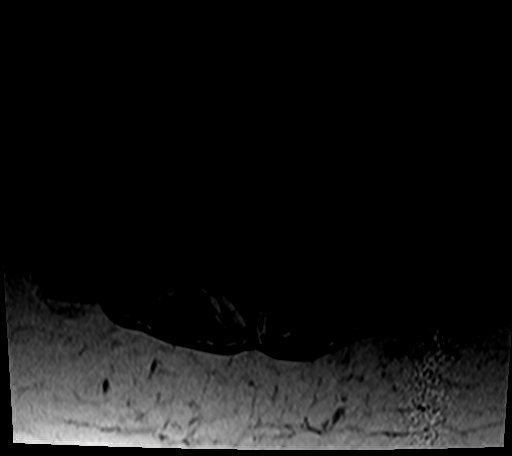
[im 5/35]
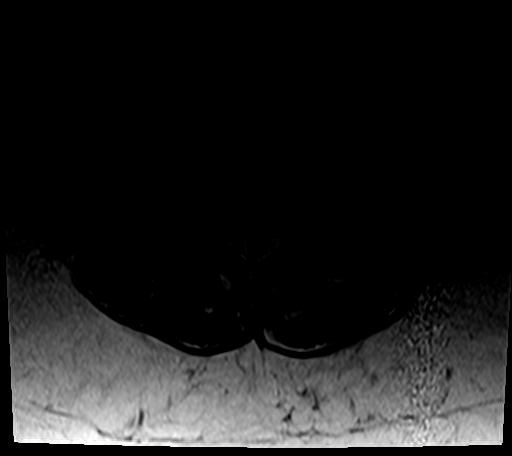
[im 18/35]
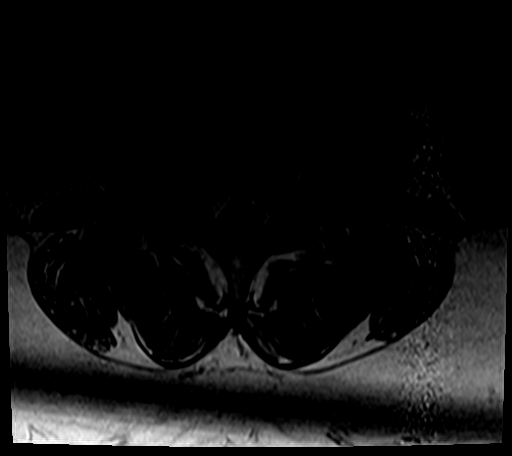
[im 30/35]
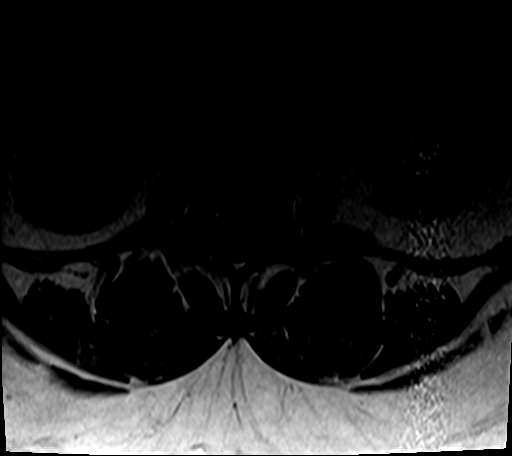

[25 of 48 positions shown; findings below may reference images not displayed]

FINDINGS: Segmentation: Standard; the lowest formed disc space is designated
L5-S1.

Alignment: 4 mm grade 1 anterolisthesis of L4 on L5 is unchanged.
Alignment is otherwise normal.

Vertebrae: Vertebral body heights are preserved. Marrow signal is
normal. There is no suspicious marrow signal abnormality or marrow
edema.

Conus medullaris and cauda equina: Conus extends to the L1 level.
Conus and cauda equina appear normal.

Paraspinal and other soft tissues: There is trace perifacetal soft
tissue edema bilaterally at L3-L4, minimally increased since the
prior study. The soft tissues are otherwise unremarkable.

Disc levels:

T12-L1: No significant spinal canal or neural foraminal stenosis

L1-L2: No significant spinal canal or neural foraminal stenosis

L2-L3: There is a mild left foraminal disc protrusion and mild facet
arthropathy resulting in mild left and no significant right neural
foraminal stenosis and no significant spinal canal stenosis, not
significantly changed.

L3-L4: There is a mild disc bulge and bilateral facet arthropathy
resulting in mild bilateral neural foraminal stenosis without
significant spinal canal stenosis, not significantly changed

L4-L5: There is disc desiccation and narrowing with a mild bulge,
grade 1 anterolisthesis, and advanced facet arthropathy resulting in
moderate left and mild right neural foraminal stenosis without
significant spinal canal stenosis, not significantly changed.

L5-S1: Mild facet arthropathy with no significant spinal canal or
neural foraminal stenosis.
IMPRESSION: 1. Bilateral facet arthropathy with mild perifacetal soft tissue
edema at L3-L4 which may reflect a source of pain, slightly
increased since 3233. Mild bilateral neural foraminal stenosis at
this level is not significantly changed.
2. Grade 1 anterolisthesis of L4 on L5 with associated advanced
facet arthropathy resulting in moderate left and mild right neural
foraminal stenosis, not significantly changed.
3. Otherwise, overall no significant interval change since
02/26/2021 with no other significant spinal canal or neural
foraminal stenosis.

## 2023-08-05 DIAGNOSIS — Z1231 Encounter for screening mammogram for malignant neoplasm of breast: Secondary | ICD-10-CM | POA: Diagnosis not present

## 2023-08-11 DIAGNOSIS — I10 Essential (primary) hypertension: Secondary | ICD-10-CM | POA: Diagnosis not present

## 2023-08-30 DIAGNOSIS — G894 Chronic pain syndrome: Secondary | ICD-10-CM | POA: Diagnosis not present

## 2023-08-30 DIAGNOSIS — J302 Other seasonal allergic rhinitis: Secondary | ICD-10-CM | POA: Diagnosis not present

## 2023-08-30 DIAGNOSIS — M25552 Pain in left hip: Secondary | ICD-10-CM | POA: Diagnosis not present

## 2023-08-30 DIAGNOSIS — M545 Low back pain, unspecified: Secondary | ICD-10-CM | POA: Diagnosis not present

## 2023-09-20 DIAGNOSIS — N183 Chronic kidney disease, stage 3 unspecified: Secondary | ICD-10-CM | POA: Diagnosis not present

## 2023-09-20 DIAGNOSIS — Z0000 Encounter for general adult medical examination without abnormal findings: Secondary | ICD-10-CM | POA: Diagnosis not present

## 2023-09-20 DIAGNOSIS — E559 Vitamin D deficiency, unspecified: Secondary | ICD-10-CM | POA: Diagnosis not present

## 2023-09-20 DIAGNOSIS — I10 Essential (primary) hypertension: Secondary | ICD-10-CM | POA: Diagnosis not present

## 2023-09-20 DIAGNOSIS — I129 Hypertensive chronic kidney disease with stage 1 through stage 4 chronic kidney disease, or unspecified chronic kidney disease: Secondary | ICD-10-CM | POA: Diagnosis not present

## 2023-09-20 DIAGNOSIS — R7303 Prediabetes: Secondary | ICD-10-CM | POA: Diagnosis not present

## 2023-09-20 DIAGNOSIS — M25552 Pain in left hip: Secondary | ICD-10-CM | POA: Diagnosis not present

## 2023-09-20 DIAGNOSIS — J45909 Unspecified asthma, uncomplicated: Secondary | ICD-10-CM | POA: Diagnosis not present

## 2023-09-20 DIAGNOSIS — E782 Mixed hyperlipidemia: Secondary | ICD-10-CM | POA: Diagnosis not present

## 2023-10-03 DIAGNOSIS — G8929 Other chronic pain: Secondary | ICD-10-CM | POA: Diagnosis not present

## 2023-10-03 DIAGNOSIS — Z1159 Encounter for screening for other viral diseases: Secondary | ICD-10-CM | POA: Diagnosis not present

## 2023-10-03 DIAGNOSIS — M129 Arthropathy, unspecified: Secondary | ICD-10-CM | POA: Diagnosis not present

## 2023-10-03 DIAGNOSIS — M25552 Pain in left hip: Secondary | ICD-10-CM | POA: Diagnosis not present

## 2023-10-03 DIAGNOSIS — Z79899 Other long term (current) drug therapy: Secondary | ICD-10-CM | POA: Diagnosis not present

## 2023-10-03 DIAGNOSIS — M5489 Other dorsalgia: Secondary | ICD-10-CM | POA: Diagnosis not present

## 2023-10-03 DIAGNOSIS — Z0189 Encounter for other specified special examinations: Secondary | ICD-10-CM | POA: Diagnosis not present

## 2023-10-03 DIAGNOSIS — E559 Vitamin D deficiency, unspecified: Secondary | ICD-10-CM | POA: Diagnosis not present

## 2023-10-03 DIAGNOSIS — Z131 Encounter for screening for diabetes mellitus: Secondary | ICD-10-CM | POA: Diagnosis not present

## 2023-10-05 DIAGNOSIS — Z79899 Other long term (current) drug therapy: Secondary | ICD-10-CM | POA: Diagnosis not present

## 2023-10-10 DIAGNOSIS — N1831 Chronic kidney disease, stage 3a: Secondary | ICD-10-CM | POA: Diagnosis not present

## 2023-10-10 DIAGNOSIS — M25552 Pain in left hip: Secondary | ICD-10-CM | POA: Diagnosis not present

## 2023-10-10 DIAGNOSIS — Z0000 Encounter for general adult medical examination without abnormal findings: Secondary | ICD-10-CM | POA: Diagnosis not present

## 2023-10-10 DIAGNOSIS — G8929 Other chronic pain: Secondary | ICD-10-CM | POA: Diagnosis not present

## 2023-10-10 DIAGNOSIS — Z79899 Other long term (current) drug therapy: Secondary | ICD-10-CM | POA: Diagnosis not present

## 2023-10-10 DIAGNOSIS — E119 Type 2 diabetes mellitus without complications: Secondary | ICD-10-CM | POA: Diagnosis not present

## 2023-10-10 DIAGNOSIS — M5489 Other dorsalgia: Secondary | ICD-10-CM | POA: Diagnosis not present

## 2023-10-13 DIAGNOSIS — Z79899 Other long term (current) drug therapy: Secondary | ICD-10-CM | POA: Diagnosis not present

## 2023-10-25 ENCOUNTER — Other Ambulatory Visit (INDEPENDENT_AMBULATORY_CARE_PROVIDER_SITE_OTHER): Payer: Medicare Other

## 2023-10-25 ENCOUNTER — Ambulatory Visit: Payer: Medicare Other | Admitting: Orthopaedic Surgery

## 2023-10-25 VITALS — BP 125/83 | HR 73 | Ht 61.0 in | Wt 256.0 lb

## 2023-10-25 DIAGNOSIS — G8929 Other chronic pain: Secondary | ICD-10-CM

## 2023-10-25 DIAGNOSIS — M4316 Spondylolisthesis, lumbar region: Secondary | ICD-10-CM

## 2023-10-25 DIAGNOSIS — M25552 Pain in left hip: Secondary | ICD-10-CM | POA: Diagnosis not present

## 2023-10-25 DIAGNOSIS — M545 Low back pain, unspecified: Secondary | ICD-10-CM | POA: Diagnosis not present

## 2023-10-25 DIAGNOSIS — M47816 Spondylosis without myelopathy or radiculopathy, lumbar region: Secondary | ICD-10-CM | POA: Diagnosis not present

## 2023-10-25 DIAGNOSIS — Z6841 Body Mass Index (BMI) 40.0 and over, adult: Secondary | ICD-10-CM

## 2023-10-25 DIAGNOSIS — E66813 Obesity, class 3: Secondary | ICD-10-CM

## 2023-10-25 NOTE — Progress Notes (Unsigned)
Office Visit Note   Patient: [name]           Date of Birth: [birth year]           MRN: [id number] Visit Date: 10/25/2023              Requested by: Courtney Paris, NP [address] [place],  [place] [address] PCP: Courtney Paris, NP   Assessment & Plan: Visit Diagnoses:  1. Chronic left-sided low back pain, unspecified whether sciatica present   2. 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  She have to [facility] seen in [facility] by Dr. Neurosurgery for further recommendations.  No work slip given.  MRI result was reviewed.  I do not have any images to review since no disc is present.  She has already been sent back to work by Dr. Renee Pain.  Follow-Up Instructions: No follow-ups on file.   Orders:  Orders Placed This Encounter  Procedures   XR Lumbar Spine Complete   XR HIP UNILAT W OR W/O PELVIS 2-3 VIEWS LEFT   No orders of the defined types were placed in this encounter.     Procedures: No procedures performed   Clinical Data: No additional findings.   Subjective: Chief Complaint  Patient presents with   Lower Back - Pain   Left Hip - Pain    HPI 62 year old female here for Worker's Comp. opinion with back pain on-the-job injury 01/15/2028.  Patient went back to work 09/19/2023 and had previously seen Dr. Renee Pain.  Patient was cleaning her room on her knees cleaning bombing the bed had sharp pain in her back at the thoracolumbar junction.  She states pain was so severe that EMS had to help her and took the emergency room.  She has been taking hydrocodone at night chronically also Skelaxin Robaxin prednisone Dosepak in the past meloxicam.  She states she is in the process of moving she had a CD of her scan MRI scan but cannot find it and did not bring it and none was sent to me.  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  Large perineural cyst can sometimes be symptomatic and should be correlated with pertinent clinical findings.  Peripheral nerve sheath tumors are felt unlikely with these imaging features.  A routine contrast-enhanced exam could be considered for definitive characterization."  42 pages of notes were brought with patient including office notes therapy notes MRI reports and were reviewed at today's exam.  Review of Systems positive for hypertension stage III kidney disease.  Stage III obesity BMI 48.  Noncontributory to HPI.   Objective: Vital Signs: BP 125/83   Pulse 73   Ht 5\' 1"  (1.549 m)   Wt 256 lb (116.1 kg)   LMP 05/17/2012   BMI 48.37 kg/m   Physical Exam Constitutional:      Appearance: She is well-developed.  HENT:     Head: Normocephalic.     Right Ear: External ear normal.     Left Ear: External ear normal. There is no impacted cerumen.  Eyes:     Pupils: Pupils are equal, round, and reactive to light.  Neck:     Thyroid: No thyromegaly.     Trachea: No tracheal  deviation.  Cardiovascular:     Rate and Rhythm: Normal rate.  Pulmonary:     Effort: Pulmonary effort is normal.  Abdominal:     Palpations: Abdomen is soft.  Musculoskeletal:     Cervical back: No rigidity.  Skin:    General: Skin is warm and dry.  Neurological:     Mental Status: She is alert and oriented to person, place, and time.  Psychiatric:        Behavior: Behavior normal.     Ortho Exam patient has tenderness thoracolumbar junction radiates directly down the midline to the sacrum.  Minimal sciatic notch tenderness minimal trochanteric bursal tenderness negative logroll hips.  Lower extremity reflexes are 2+ normal heel-toe gait.  Specialty Comments:  No specialty comments available.  Imaging: No results found.   PMFS History: Patient Active Problem List   Diagnosis Date  Noted   Closed displaced fracture of proximal phalanx of lesser toe of left foot 04/28/2023   Polyp of colon 11/22/2022   Epigastric pain 08/30/2022   Long-term current use of opiate analgesic 06/03/2022   Osteoarthritis of multiple joints 12/29/2021   Screening for cervical cancer 10/23/2021   Need for shingles vaccine 10/23/2021   Encounter for general adult medical examination with abnormal findings 10/22/2021   Chronic painful diabetic neuropathy (HCC) 09/21/2021   Prolonged Q-T interval on ECG 10/18/2019   Type 2 diabetes mellitus with obesity (HCC) 07/11/2019   Intrinsic eczema 07/04/2019   CKD (chronic kidney disease) stage 3, GFR 30-59 ml/min (HCC) 10/11/2018   Class 3 severe obesity due to excess calories with serious comorbidity and body mass index (BMI) of 40.0 to 44.9 in adult (HCC) 09/14/2018   Chronic right-sided low back pain with right-sided sciatica 09/14/2018   Hyperlipidemia with target LDL less than 130 07/05/2018   Mild intermittent asthma without complication 07/05/2018   Snoring 12/14/2016   Essential hypertension 12/14/2016   Gastroesophageal reflux disease with esophagitis 08/26/2016   Dysphagia 08/26/2016   Spondylolisthesis of lumbar region 05/28/2016   Fibromyalgia 08/08/2015   Lumbar facet arthropathy 08/08/2015   Primary osteoarthritis of both knees 08/25/2014   Encounter for screening mammogram for breast cancer 06/01/2013   Abnormal electrocardiogram 02/05/2010   ALLERGIC RHINITIS 01/26/2010   Past Medical History:  Diagnosis Date   Allergy    rhinitis   Anemia    Arthritis    Asthma    Diabetes mellitus without complication (HCC)    Headache(784.0)    History of stomach ulcers    Hypertension    Internal hemorrhoids     Family History  Problem Relation Age of Onset   Arthritis Other    Diabetes Other    Hypertension Other    Breast cancer Other    Hypertension Mother    Diabetes Sister    Hypertension Sister    Breast cancer  Daughter    Cancer Neg Hx    Early death Neg Hx    Hearing loss Neg Hx    Heart disease Neg Hx    Hyperlipidemia Neg Hx    Kidney disease Neg Hx    Stroke Neg Hx    Colon cancer Neg Hx     Past Surgical History:  Procedure Laterality Date   CARPAL TUNNEL RELEASE Right    TUBAL LIGATION     Social History   Occupational History   Occupation: disabilty  Tobacco Use   Smoking status: Never    Passive exposure: Yes   Smokeless tobacco: Never  Tobacco comments:    husband smokes outside and inside the home  Vaping Use   Vaping status: Never Used  Substance and Sexual Activity   Alcohol use: No    Alcohol/week: 0.0 standard drinks of alcohol   Drug use: No   Sexual activity: Yes    Birth control/protection: Post-menopausal, Surgical

## 2023-10-26 NOTE — Addendum Note (Signed)
Addended by: Rogers Seeds on: 10/26/2023 11:26 AM   Modules accepted: Level of Service

## 2023-10-26 NOTE — Progress Notes (Signed)
Office Visit Note   Patient: [name]           Date of Birth: [birth year]           MRN: [id number] Visit Date: 10/25/2023              Requested by: Courtney Paris, NP [address] [place],  [place] [address] PCP: Courtney Paris, NP   Assessment & Plan: Visit Diagnoses:  1. Chronic left-sided low back pain, unspecified whether sciatica present   2. Pain in left hip   3. Spondylolisthesis of lumbar region   4. Lumbar facet arthropathy     Plan: Long discussion about her weight anterolisthesis in her back she may have progressive problems with time and may have increased symptoms.  She needs to reach her goal of weight of 211.  Her daughter had lost more than 60 pounds in the past with dieting.  She has not talk with the dietitian in the past.  Reviewed x-rays with anterolisthesis at L4-5.  I discussed with the that pain laterally in her hip is probably coming from her back with degenerative anterolisthesis and foraminal narrowing.  At some point she might require fusion needs to have her BMI below 40.  Weight loss was encouraged.  She can follow-up when she reaches her goal and she can see Dr. Christell Constant if she develops neurogenic claudication symptoms or progressive left radicular symptoms.  Follow-Up Instructions: No follow-ups on file.   Orders:  Orders Placed This Encounter  Procedures   XR Lumbar Spine Complete   XR HIP UNILAT W OR W/O PELVIS 2-3 VIEWS LEFT   No orders of the defined types were placed in this encounter.     Procedures: No procedures performed   Clinical Data: No additional findings.   Subjective: Chief Complaint  Patient presents with   Lower Back - Pain   Left Hip - Pain    HPI 62 year old female with chronic back pain left leg pain.  States its gradually getting worse with some numbness and tingling more so down to her left foot.  She is at [facility] pain management and hydrocodone 28 tablets lasts her 1 week after codon 5/325.  She states has  helped some.  BMI is elevated and her goal is to get to 211 pounds to get her BMI below 40.  Patient's had trochanteric injections in the past.  Review of Systems all the systems noncontributory to HPI.   Objective: Vital Signs: BP 125/83   Pulse 73   Ht 5\' 1"  (1.549 m)   Wt 256 lb (116.1 kg)   LMP 05/17/2012   BMI 48.37 kg/m   Physical Exam Constitutional:      Appearance: She is well-developed.  HENT:     Head: Normocephalic.     Right Ear: External ear normal.     Left Ear: External ear normal. There is no impacted cerumen.  Eyes:     Pupils: Pupils are equal, round, and reactive to light.  Neck:     Thyroid: No thyromegaly.     Trachea: No tracheal deviation.  Cardiovascular:     Rate and Rhythm: Normal rate.  Pulmonary:     Effort: Pulmonary effort is normal.  Abdominal:     Palpations: Abdomen is soft.  Musculoskeletal:     Cervical back: No rigidity.  Skin:    General: Skin is warm and dry.  Neurological:     Mental Status: She is alert and oriented to  person, place, and time.  Psychiatric:        Behavior: Behavior normal.     Ortho Exam negative logroll hips right and left.  Some sciatic notch tenderness trochanteric bursal tenderness more on the left than right.  Anterior tib gastrocsoleus is active.  Specialty Comments:  No specialty comments available.  Imaging: XR Lumbar Spine Complete Result Date: 10/26/2023 AP lateral lumbar images are obtained and reviewed.  This shows facet degenerative changes L4-5 and L5-S1 with grade 1 anterolisthesis L4-5 mild L4-5 narrowing. Impression: L4-5 degenerative anterolisthesis.  XR HIP UNILAT W OR W/O PELVIS 2-3 VIEWS LEFT Result Date: 10/26/2023 AP pelvis frog-leg left hip obtained and reviewed.  This shows maintained joint hip space.  No marginal osteophytes.  Femoral neck pelvis is normal. Impression: left hip radiographs negative for acute or chronic changes.    PMFS History: Patient Active Problem List    Diagnosis Date Noted   Closed displaced fracture of proximal phalanx of lesser toe of left foot 04/28/2023   Polyp of colon 11/22/2022   Epigastric pain 08/30/2022   Long-term current use of opiate analgesic 06/03/2022   Osteoarthritis of multiple joints 12/29/2021   Screening for cervical cancer 10/23/2021   Need for shingles vaccine 10/23/2021   Encounter for general adult medical examination with abnormal findings 10/22/2021   Chronic painful diabetic neuropathy (HCC) 09/21/2021   Prolonged Q-T interval on ECG 10/18/2019   Type 2 diabetes mellitus with obesity (HCC) 07/11/2019   Intrinsic eczema 07/04/2019   CKD (chronic kidney disease) stage 3, GFR 30-59 ml/min (HCC) 10/11/2018   Class 3 severe obesity due to excess calories with serious comorbidity and body mass index (BMI) of 40.0 to 44.9 in adult (HCC) 09/14/2018   Chronic right-sided low back pain with right-sided sciatica 09/14/2018   Hyperlipidemia with target LDL less than 130 07/05/2018   Mild intermittent asthma without complication 07/05/2018   Snoring 12/14/2016   Essential hypertension 12/14/2016   Gastroesophageal reflux disease with esophagitis 08/26/2016   Dysphagia 08/26/2016   Spondylolisthesis of lumbar region 05/28/2016   Fibromyalgia 08/08/2015   Lumbar facet arthropathy 08/08/2015   Primary osteoarthritis of both knees 08/25/2014   Encounter for screening mammogram for breast cancer 06/01/2013   Abnormal electrocardiogram 02/05/2010   ALLERGIC RHINITIS 01/26/2010   Past Medical History:  Diagnosis Date   Allergy    rhinitis   Anemia    Arthritis    Asthma    Diabetes mellitus without complication (HCC)    Headache(784.0)    History of stomach ulcers    Hypertension    Internal hemorrhoids     Family History  Problem Relation Age of Onset   Arthritis Other    Diabetes Other    Hypertension Other    Breast cancer Other    Hypertension Mother    Diabetes Sister    Hypertension Sister     Breast cancer Daughter    Cancer Neg Hx    Early death Neg Hx    Hearing loss Neg Hx    Heart disease Neg Hx    Hyperlipidemia Neg Hx    Kidney disease Neg Hx    Stroke Neg Hx    Colon cancer Neg Hx     Past Surgical History:  Procedure Laterality Date   CARPAL TUNNEL RELEASE Right    TUBAL LIGATION     Social History   Occupational History   Occupation: disabilty  Tobacco Use   Smoking status: Never    Passive  exposure: Yes   Smokeless tobacco: Never   Tobacco comments:    husband smokes outside and inside the home  Vaping Use   Vaping status: Never Used  Substance and Sexual Activity   Alcohol use: No    Alcohol/week: 0.0 standard drinks of alcohol   Drug use: No   Sexual activity: Yes    Birth control/protection: Post-menopausal, Surgical

## 2023-10-26 NOTE — Addendum Note (Signed)
Addended by: Eldred Manges on: 10/26/2023 11:29 AM   Modules accepted: Level of Service

## 2023-11-11 DIAGNOSIS — G894 Chronic pain syndrome: Secondary | ICD-10-CM | POA: Diagnosis not present

## 2023-11-11 DIAGNOSIS — R7303 Prediabetes: Secondary | ICD-10-CM | POA: Diagnosis not present

## 2023-11-24 ENCOUNTER — Ambulatory Visit (HOSPITAL_COMMUNITY)
Admission: EM | Admit: 2023-11-24 | Discharge: 2023-11-24 | Disposition: A | Discharge status: Home / Self Care | Payer: Medicare Other

## 2023-11-24 ENCOUNTER — Encounter (HOSPITAL_COMMUNITY): Payer: Self-pay

## 2023-11-24 DIAGNOSIS — M545 Low back pain, unspecified: Secondary | ICD-10-CM

## 2023-11-24 MED ORDER — ACETAMINOPHEN 500 MG PO TABS: 1000.0000 mg | ORAL_TABLET | Freq: Three times a day (TID) | ORAL | 0 refills | Status: AC

## 2023-11-24 MED ORDER — ACETAMINOPHEN 325 MG PO TABS
ORAL_TABLET | ORAL | Status: AC
  Filled 2023-11-24: qty 3

## 2023-11-24 MED ORDER — KETOROLAC TROMETHAMINE 30 MG/ML IJ SOLN
15.0000 mg | Freq: Once | INTRAMUSCULAR | Status: AC
  Administered 2023-11-24: 15 mg via INTRAMUSCULAR

## 2023-11-24 MED ORDER — ACETAMINOPHEN 325 MG PO TABS
975.0000 mg | ORAL_TABLET | Freq: Once | ORAL | Status: AC
  Administered 2023-11-24: 975 mg via ORAL

## 2023-11-24 MED ORDER — BACLOFEN 10 MG PO TABS: 10.0000 mg | ORAL_TABLET | Freq: Three times a day (TID) | ORAL | 0 refills | Status: AC

## 2023-11-24 MED ORDER — MELOXICAM 7.5 MG PO TABS: 7.5000 mg | ORAL_TABLET | Freq: Every day | ORAL | 0 refills | Status: DC

## 2023-11-24 MED ORDER — KETOROLAC TROMETHAMINE 30 MG/ML IJ SOLN
INTRAMUSCULAR | Status: AC
  Filled 2023-11-24: qty 1

## 2023-11-24 NOTE — Discharge Instructions (Addendum)
The mainstay of therapy for musculoskeletal pain is reduction of inflammation and relaxation of tension which is causing inflammation.  Keep in mind, pain always begets more pain.  To help you stay ahead of your pain and inflammation, I have provided the following regimen for you:   During your visit today, you received an injection of ketorolac, high-dose nonsteroidal anti-inflammatory pain medication that should significantly reduce your pain for the next 6 to 8 hours.   You were also provided with Tylenol (acetaminophen) 975 mg.  Please begin taking Tylenol 1000 mg every 8 hours to keep your pain well controlled, your next dose can be taken first thing tomorrow morning. Please know that It is safe to take a maximum 3000 mg of Tylenol in a 24-hour period.  Please do not exceed this amount.  Tylenol works best when taken on a scheduled basis.   This evening, you can begin taking baclofen 10 mg.  This is a highly effective muscle relaxer and antispasmodic which should continue to provide you with relaxation of your tense muscles, allow you to sleep well and to keep your pain under control.  You can continue taking this medication 3 times daily as you need to.  If you find that this medication makes you too sleepy, you can break them in half for your daytime doses and, if needed double them for your nighttime dose.  Do not take more than 30 mg of baclofen in a 24-hour period.   Tomorrow morning, please begin taking meloxicam 7.5 mg once daily.  This is a nonsteroidal anti-inflammatory pain medication that works similarly to ibuprofen and naproxen.  Please do not take either of these medications while taking meloxicam.     During the day, please set aside time to apply ice to the affected area 4 times daily for 20 minutes each application.  This can be achieved by using a bag of frozen peas or corn, a Ziploc bag filled with ice and water, or Ziploc bag filled with half rubbing alcohol and half Dawn dish  detergent, frozen into a slush.  Please be careful not to apply ice directly to your skin, always place a soft cloth between you and the ice pack.  Over-the-counter products such as IcyHot and Biofreeze do not work nearly as well.   Please consider discussing referral to physical therapy with your primary care provider.  Physical therapist are very good at teasing out the underlying cause of acute musculoskeletal pain and helping with prevention of future recurrences.   Please avoid attempts to stretch or strengthen the affected area until you are feeling completely pain-free.  Attempts to do so will only prolong the healing process.   I also recommend that you remain out of work for the next several days, I provided you with a note to return to work in 3 days.  If you feel that you need this time extended, please follow-up with your primary care provider or return to urgent care for reevaluation so that we can provide you with a note for another 3 days.   Thank you for visiting  Urgent Care today.  We appreciate the opportunity to participate in your care.

## 2023-11-24 NOTE — ED Provider Notes (Signed)
[facility]    CSN: [id number] Arrival date & time: 11/24/23  1645    HISTORY   Chief Complaint  Patient presents with   Back Pain   HPI [name] is a pleasant, 63 y.o. female who presents to urgent care today. Patient reports a history of chronic lower back pain for the past 2 years.  Patient does see a spine specialist.  Patient states she has been provided with a prescription for Norco 5 mg but this is not helping her pain at this time.  Patient states she reached up to change a light bulb at home which started worsening pain.  Patient denies symptoms of urinary tract infection, flank pain, diarrhea or constipation.  Patient states she does not take anything on a daily basis for lower back pain.  Patient states she has been advised to lose weight before she can have back surgery.  EMR reviewed, x-ray performed in December 2024 revealed anterior listhesis of the lumbar spine.  Patient was able to ambulate independently into the clinic today however she is very uncomfortable sitting in a chair.  Patient states she is also unable to sleep while lying on her left side.  The history is provided by the patient.   Past Medical History:  Diagnosis Date   Allergy    rhinitis   Anemia    Arthritis    Asthma    Diabetes mellitus without complication (HCC)    Headache(784.0)    History of stomach ulcers    Hypertension    Internal hemorrhoids    Patient Active Problem List   Diagnosis Date Noted   Closed displaced fracture of proximal phalanx of lesser toe of left foot 04/28/2023   Polyp of colon 11/22/2022   Epigastric pain 08/30/2022   Long-term current use of opiate analgesic 06/03/2022   Osteoarthritis of multiple joints 12/29/2021   Screening for cervical cancer 10/23/2021   Need for shingles vaccine 10/23/2021   Encounter for general adult medical examination with abnormal findings 10/22/2021   Chronic painful diabetic neuropathy (HCC) 09/21/2021    Prolonged Q-T interval on ECG 10/18/2019   Type 2 diabetes mellitus with obesity (HCC) 07/11/2019   Intrinsic eczema 07/04/2019   CKD (chronic kidney disease) stage 3, GFR 30-59 ml/min (HCC) 10/11/2018   Class 3 severe obesity due to excess calories with serious comorbidity and body mass index (BMI) of 40.0 to 44.9 in adult (HCC) 09/14/2018   Chronic right-sided low back pain with right-sided sciatica 09/14/2018   Hyperlipidemia with target LDL less than 130 07/05/2018   Mild intermittent asthma without complication 07/05/2018   Snoring 12/14/2016   Essential hypertension 12/14/2016   Gastroesophageal reflux disease with esophagitis 08/26/2016   Dysphagia 08/26/2016   Spondylolisthesis of lumbar region 05/28/2016   Fibromyalgia 08/08/2015   Lumbar facet arthropathy 08/08/2015   Primary osteoarthritis of both knees 08/25/2014   Encounter for screening mammogram for breast cancer 06/01/2013   Abnormal electrocardiogram 02/05/2010   ALLERGIC RHINITIS 01/26/2010   Past Surgical History:  Procedure Laterality Date   CARPAL TUNNEL RELEASE Right    TUBAL LIGATION     OB History     Gravida  5   Para  4   Term  4   Preterm  0   AB  1   Living  4      SAB  1   IAB  0   Ectopic  0   Multiple  0   Live Births  4          Home Medications    Prior to Admission medications   Medication Sig Start Date End Date Taking? Authorizing Provider  phentermine (ADIPEX-P) 37.5 MG tablet Take 37.5 mg by mouth daily. 11/11/23  Yes [provider]  Accu-Chek FastClix Lancets MISC USE 1  TO CHECK GLUCOSE TWICE DAILY 11/29/19  Yes Etta Grandchild, MD  albuterol (PROVENTIL HFA;VENTOLIN HFA) 108 (90 Base) MCG/ACT inhaler Inhale 2 puffs into the lungs every 4 (four) hours as needed for wheezing or shortness of breath (cough, shortness of breath or wheezing.). 03/11/18  Yes Elvina Sidle, MD  blood glucose meter kit and supplies KIT Use to test blood sugar twice daily. DX: E11.8  07/30/19  Yes Etta Grandchild, MD  Cholecalciferol (VITAMIN D3) 25 MCG (1000 UT) CAPS Take 1 capsule (1,000 Units total) by mouth daily. 08/28/21  Yes Burns, Bobette Mo, MD  famotidine (PEPCID) 20 MG tablet Take 1 tablet (20 mg total) by mouth 2 (two) times daily. 12/17/22  Yes Esterwood, Amy S, PA-C  glucose blood (ACCU-CHEK GUIDE) test strip Use as instructed twice daily. 11/29/19  Yes Etta Grandchild, MD  HYDROcodone-acetaminophen (NORCO/VICODIN) 5-325 MG tablet Take 1 tablet by mouth every 6 (six) hours as needed for severe pain. 02/03/23  Yes Etta Grandchild, MD  levocetirizine (XYZAL) 5 MG tablet TAKE 1 TABLET BY MOUTH ONCE DAILY IN THE EVENING 01/04/23  Yes Etta Grandchild, MD  ondansetron (ZOFRAN-ODT) 4 MG disintegrating tablet Take 1 tablet (4 mg total) by mouth every 8 (eight) hours as needed. 04/03/23  Yes Mardene Sayer, MD  potassium chloride (KLOR-CON) 10 MEQ tablet Take 1 tablet by mouth twice daily 03/05/23  Yes Etta Grandchild, MD  rosuvastatin (CRESTOR) 20 MG tablet Take 1 tablet by mouth once daily 02/08/23  Yes Etta Grandchild, MD  triamcinolone cream (KENALOG) 0.5 % APPLY ONE APPLICATION TOPICALLY THREE TIMES DAILY. 06/28/22  Yes Etta Grandchild, MD  triamterene-hydrochlorothiazide (DYAZIDE) 37.5-25 MG capsule Take 1 capsule by mouth once daily 02/03/23  Yes Etta Grandchild, MD  vitamin B-12 (CYANOCOBALAMIN) 1000 MCG tablet Take 1 tablet (1,000 mcg total) by mouth daily. 08/28/21  Yes Burns, Bobette Mo, MD    Family History Family History  Problem Relation Age of Onset   Arthritis Other    Diabetes Other    Hypertension Other    Breast cancer Other    Hypertension Mother    Diabetes Sister    Hypertension Sister    Breast cancer Daughter    Cancer Neg Hx    Early death Neg Hx    Hearing loss Neg Hx    Heart disease Neg Hx    Hyperlipidemia Neg Hx    Kidney disease Neg Hx    Stroke Neg Hx    Colon cancer Neg Hx    Social History Social History   Tobacco Use   Smoking  status: Never    Passive exposure: Yes   Smokeless tobacco: Never   Tobacco comments:    husband smokes outside and inside the home  Vaping Use   Vaping status: Never Used  Substance Use Topics   Alcohol use: No    Alcohol/week: 0.0 standard drinks of alcohol   Drug use: No   Allergies   Verapamil  Review of Systems Review of Systems Pertinent findings revealed after performing a 14 point review of systems has been noted in the history of present illness.  Physical  Exam Vital Signs BP 127/76 (BP Location: Right Arm)   Pulse 67   Temp 97.9 F (36.6 C) (Oral)   Resp 18   Ht 5\' 1"  (1.549 m)   Wt 252 lb (114.3 kg)   LMP 05/17/2012   SpO2 97%   BMI 47.61 kg/m   No data found.  Physical Exam Vitals and nursing note reviewed.  Constitutional:      General: She is awake. She is not in acute distress.    Appearance: Normal appearance. She is well-developed and well-groomed. She is morbidly obese.  HENT:     Head: Normocephalic and atraumatic.  Eyes:     Pupils: Pupils are equal, round, and reactive to light.  Cardiovascular:     Rate and Rhythm: Normal rate and regular rhythm.  Pulmonary:     Effort: Pulmonary effort is normal.     Breath sounds: Normal breath sounds.  Musculoskeletal:     Cervical back: Normal range of motion and neck supple.     Lumbar back: Spasms and tenderness present. Decreased range of motion.  Skin:    General: Skin is warm and dry.  Neurological:     General: No focal deficit present.     Mental Status: She is alert and oriented to person, place, and time. Mental status is at baseline.  Psychiatric:        Mood and Affect: Mood normal.        Behavior: Behavior normal. Behavior is cooperative.        Thought Content: Thought content normal.        Judgment: Judgment normal.     UC Couse / Diagnostics / Procedures:     Radiology No results found.  Procedures Procedures (including critical care time) EKG  Pending results:   Labs Reviewed - No data to display  Medications Ordered in UC: Medications  ketorolac (TORADOL) 30 MG/ML injection 15 mg (has no administration in time range)  acetaminophen (TYLENOL) tablet 975 mg (has no administration in time range)    UC Diagnoses / Final Clinical Impressions(s)   I have reviewed the triage vital signs and the nursing notes.  Pertinent labs & imaging results that were available during my care of the patient were reviewed by me and considered in my medical decision making (see chart for details).    Final diagnoses:  Acute left-sided low back pain without sciatica    Patient was provided with an injection of ketorolac 15 mg during their visit today for acute pain relief. Patient was advised to: Take meloxicam 7.5 mg daily until follow-up with spine specialist Take muscle relaxer 3 times daily (Patient has been advised that if this makes them sleepy, they can just take this at bedtime, up to 20 mg per dose, and try breaking the tablets in half or 5 mg per dose during the day) Begin acetaminophen 1000 mg 3 times daily on a scheduled basis. Apply ice pack to affected area 4 times daily for 20 minutes each time Consider physical therapy, chiropractic care, orthopedic follow-up Avoid stretching or strengthening exercises until pain is completely resolved Return precautions advised  Please see discharge instructions below for details of plan of care as provided to patient. ED Prescriptions     Medication Sig Dispense Auth. Provider   meloxicam (MOBIC) 7.5 MG tablet Take 1 tablet (7.5 mg total) by mouth daily. 30 tablet Theadora Rama Scales, PA-C   acetaminophen (TYLENOL) 500 MG tablet Take 2 tablets (1,000 mg total) by mouth  every 8 (eight) hours. 180 tablet Theadora Rama Scales, PA-C   baclofen (LIORESAL) 10 MG tablet Take 1 tablet (10 mg total) by mouth 3 (three) times daily for 7 days. 21 tablet Theadora Rama Scales, PA-C      PDMP not reviewed this  encounter.    Discharge Instructions      The mainstay of therapy for musculoskeletal pain is reduction of inflammation and relaxation of tension which is causing inflammation.  Keep in mind, pain always begets more pain.  To help you stay ahead of your pain and inflammation, I have provided the following regimen for you:   During your visit today, you received an injection of ketorolac, high-dose nonsteroidal anti-inflammatory pain medication that should significantly reduce your pain for the next 6 to 8 hours.   You were also provided with Tylenol (acetaminophen) 975 mg.  Please begin taking Tylenol 1000 mg every 8 hours to keep your pain well controlled, your next dose can be taken first thing tomorrow morning. Please know that It is safe to take a maximum 3000 mg of Tylenol in a 24-hour period.  Please do not exceed this amount.  Tylenol works best when taken on a scheduled basis.   This evening, you can begin taking baclofen 10 mg.  This is a highly effective muscle relaxer and antispasmodic which should continue to provide you with relaxation of your tense muscles, allow you to sleep well and to keep your pain under control.  You can continue taking this medication 3 times daily as you need to.  If you find that this medication makes you too sleepy, you can break them in half for your daytime doses and, if needed double them for your nighttime dose.  Do not take more than 30 mg of baclofen in a 24-hour period.   Tomorrow morning, please begin taking meloxicam 7.5 mg once daily.  This is a nonsteroidal anti-inflammatory pain medication that works similarly to ibuprofen and naproxen.  Please do not take either of these medications while taking meloxicam.     During the day, please set aside time to apply ice to the affected area 4 times daily for 20 minutes each application.  This can be achieved by using a bag of frozen peas or corn, a Ziploc bag filled with ice and water, or Ziploc bag filled  with half rubbing alcohol and half Dawn dish detergent, frozen into a slush.  Please be careful not to apply ice directly to your skin, always place a soft cloth between you and the ice pack.  Over-the-counter products such as IcyHot and Biofreeze do not work nearly as well.   Please consider discussing referral to physical therapy with your primary care provider.  Physical therapist are very good at teasing out the underlying cause of acute musculoskeletal pain and helping with prevention of future recurrences.   Please avoid attempts to stretch or strengthen the affected area until you are feeling completely pain-free.  Attempts to do so will only prolong the healing process.   I also recommend that you remain out of work for the next several days, I provided you with a note to return to work in 3 days.  If you feel that you need this time extended, please follow-up with your primary care provider or return to urgent care for reevaluation so that we can provide you with a note for another 3 days.   Thank you for visiting [facility] today.  We appreciate the  opportunity to participate in your care.       Disposition Upon Discharge:  Condition: stable for discharge home Home: take medications as prescribed; routine discharge instructions as discussed; follow up as advised.  Patient presented with an acute illness with associated systemic symptoms and significant discomfort requiring urgent management. In my opinion, this is a condition that a prudent lay person (someone who possesses an average knowledge of health and medicine) may potentially expect to result in complications if not addressed urgently such as respiratory distress, impairment of bodily function or dysfunction of bodily organs.   Routine symptom specific, illness specific and/or disease specific instructions were discussed with the patient and/or caregiver at length.   As such, the patient has been evaluated and  assessed, work-up was performed and treatment was provided in alignment with urgent care protocols and evidence based medicine.  Patient/parent/caregiver has been advised that the patient may require follow up for further testing and treatment if the symptoms continue in spite of treatment, as clinically indicated and appropriate.  Patient/parent/caregiver has been advised to report to orthopedic urgent care clinic or return to the [facility] or PCP in 3-5 days if no better; follow-up with orthopedics, PCP or the Emergency Department if new signs and symptoms develop or if the current signs or symptoms continue to change or worsen for further workup, evaluation and treatment as clinically indicated and appropriate  The patient will follow up with their current PCP if and as advised. If the patient does not currently have a PCP we will have assisted them in obtaining one.   The patient may need specialty follow up if the symptoms continue, in spite of conservative treatment and management, for further workup, evaluation, consultation and treatment as clinically indicated and appropriate.  Patient/parent/caregiver verbalized understanding and agreement of plan as discussed.  All questions were addressed during visit.  Please see discharge instructions below for further details of plan.  This office note has been dictated using Teaching laboratory technician.  Unfortunately, this method of dictation can sometimes lead to typographical or grammatical errors.  I apologize for your inconvenience in advance if this occurs.  Please do not hesitate to reach out to me if clarification is needed.      Theadora Rama Scales, [place] 11/24/23 [phone]

## 2023-11-24 NOTE — ED Triage Notes (Signed)
Lower Back pain x 2 years. No recent injuries or falls. States yesterday the pain got worse. No urinary symptoms, no diarrhea or constipation.   Patient took her prescribed pain meds with no relief (hydrocodone).

## 2023-12-10 DIAGNOSIS — H35373 Puckering of macula, bilateral: Secondary | ICD-10-CM | POA: Diagnosis not present

## 2023-12-10 DIAGNOSIS — H52223 Regular astigmatism, bilateral: Secondary | ICD-10-CM | POA: Diagnosis not present

## 2023-12-10 DIAGNOSIS — H25813 Combined forms of age-related cataract, bilateral: Secondary | ICD-10-CM | POA: Diagnosis not present

## 2023-12-10 DIAGNOSIS — H5203 Hypermetropia, bilateral: Secondary | ICD-10-CM | POA: Diagnosis not present

## 2023-12-21 DIAGNOSIS — R7303 Prediabetes: Secondary | ICD-10-CM | POA: Diagnosis not present

## 2023-12-21 DIAGNOSIS — I10 Essential (primary) hypertension: Secondary | ICD-10-CM | POA: Diagnosis not present

## 2023-12-21 DIAGNOSIS — G894 Chronic pain syndrome: Secondary | ICD-10-CM | POA: Diagnosis not present

## 2023-12-21 DIAGNOSIS — E782 Mixed hyperlipidemia: Secondary | ICD-10-CM | POA: Diagnosis not present

## 2023-12-23 DIAGNOSIS — N1831 Chronic kidney disease, stage 3a: Secondary | ICD-10-CM | POA: Diagnosis not present

## 2023-12-23 DIAGNOSIS — G8929 Other chronic pain: Secondary | ICD-10-CM | POA: Diagnosis not present

## 2023-12-23 DIAGNOSIS — Z0000 Encounter for general adult medical examination without abnormal findings: Secondary | ICD-10-CM | POA: Diagnosis not present

## 2023-12-23 DIAGNOSIS — M5489 Other dorsalgia: Secondary | ICD-10-CM | POA: Diagnosis not present

## 2023-12-23 DIAGNOSIS — M25552 Pain in left hip: Secondary | ICD-10-CM | POA: Diagnosis not present

## 2023-12-23 DIAGNOSIS — E119 Type 2 diabetes mellitus without complications: Secondary | ICD-10-CM | POA: Diagnosis not present

## 2023-12-23 DIAGNOSIS — Z79899 Other long term (current) drug therapy: Secondary | ICD-10-CM | POA: Diagnosis not present

## 2023-12-28 DIAGNOSIS — Z79899 Other long term (current) drug therapy: Secondary | ICD-10-CM | POA: Diagnosis not present

## 2024-02-14 ENCOUNTER — Encounter (HOSPITAL_COMMUNITY): Payer: Self-pay | Admitting: *Deleted

## 2024-02-14 ENCOUNTER — Ambulatory Visit (HOSPITAL_COMMUNITY)
Admission: EM | Admit: 2024-02-14 | Discharge: 2024-02-14 | Disposition: A | Discharge status: Home / Self Care | Attending: Emergency Medicine | Admitting: Emergency Medicine

## 2024-02-14 DIAGNOSIS — G8929 Other chronic pain: Secondary | ICD-10-CM | POA: Diagnosis not present

## 2024-02-14 DIAGNOSIS — M62830 Muscle spasm of back: Secondary | ICD-10-CM | POA: Diagnosis not present

## 2024-02-14 DIAGNOSIS — M545 Low back pain, unspecified: Secondary | ICD-10-CM | POA: Diagnosis not present

## 2024-02-14 LAB — POCT FASTING CBG KUC MANUAL ENTRY: POCT Glucose (KUC): 88 mg/dL (ref 70–99)

## 2024-02-14 MED ORDER — METHYLPREDNISOLONE SODIUM SUCC 125 MG IJ SOLR
60.0000 mg | Freq: Once | INTRAMUSCULAR | Status: AC
  Administered 2024-02-14: 60 mg via INTRAMUSCULAR

## 2024-02-14 MED ORDER — CYCLOBENZAPRINE HCL 10 MG PO TABS: 10.0000 mg | ORAL_TABLET | Freq: Two times a day (BID) | ORAL | 0 refills | Status: AC | PRN

## 2024-02-14 MED ORDER — METHYLPREDNISOLONE SODIUM SUCC 125 MG IJ SOLR
INTRAMUSCULAR | Status: AC
  Filled 2024-02-14: qty 2

## 2024-02-14 NOTE — ED Provider Notes (Signed)
 [facility]    CSN: [id number] Arrival date & time: 02/14/24  6295      History   Chief Complaint Chief Complaint  Patient presents with   Back Pain    HPI [name] is a 63 y.o. female.  Here with 3 day history of increased left lower back pain Reports a "flare up of her arthritis" Rating 10/10 pain currently. Reporting spasm and tightness  She sees pain specialist and orthopedics for this. Usually takes hydrocodone, last fill 12/23/2023. Reports they won't refill it since she owes money (?)  Pain does not radiate into the legs No weakness, bladder or bowel dysfunction No new trauma or fall  Seen here for this on 1/16, given toradol shot, meloxicam, baclofen  History of chronic left low back pain, spondylolisthesis, lumbar facet arthropathy. Type 2 diabetes. Last A1c documented is 2 years ago, 6.8 CKD stage 3  Past Medical History:  Diagnosis Date   Allergy    rhinitis   Anemia    Arthritis    Asthma    Diabetes mellitus without complication (HCC)    Headache(784.0)    History of stomach ulcers    Hypertension    Internal hemorrhoids     Patient Active Problem List   Diagnosis Date Noted   Closed displaced fracture of proximal phalanx of lesser toe of left foot 04/28/2023   Polyp of colon 11/22/2022   Epigastric pain 08/30/2022   Long-term current use of opiate analgesic 06/03/2022   Osteoarthritis of multiple joints 12/29/2021   Screening for cervical cancer 10/23/2021   Need for shingles vaccine 10/23/2021   Encounter for general adult medical examination with abnormal findings 10/22/2021   Chronic painful diabetic neuropathy (HCC) 09/21/2021   Prolonged Q-T interval on ECG 10/18/2019   Type 2 diabetes mellitus with obesity (HCC) 07/11/2019   Intrinsic eczema 07/04/2019   CKD (chronic kidney disease) stage 3, GFR 30-59 ml/min (HCC) 10/11/2018   Class 3 severe obesity due to excess calories with serious comorbidity and body mass index  (BMI) of 40.0 to 44.9 in adult (HCC) 09/14/2018   Chronic right-sided low back pain with right-sided sciatica 09/14/2018   Hyperlipidemia with target LDL less than 130 07/05/2018   Mild intermittent asthma without complication 07/05/2018   Snoring 12/14/2016   Essential hypertension 12/14/2016   Gastroesophageal reflux disease with esophagitis 08/26/2016   Dysphagia 08/26/2016   Spondylolisthesis of lumbar region 05/28/2016   Fibromyalgia 08/08/2015   Lumbar facet arthropathy 08/08/2015   Primary osteoarthritis of both knees 08/25/2014   Encounter for screening mammogram for breast cancer 06/01/2013   Abnormal electrocardiogram 02/05/2010   ALLERGIC RHINITIS 01/26/2010    Past Surgical History:  Procedure Laterality Date   CARPAL TUNNEL RELEASE Right    TUBAL LIGATION      OB History     Gravida  5   Para  4   Term  4   Preterm  0   AB  1   Living  4      SAB  1   IAB  0   Ectopic  0   Multiple  0   Live Births  4            Home Medications    Prior to Admission medications   Medication Sig Start Date End Date Taking? Authorizing Provider  Cholecalciferol (VITAMIN D3) 25 MCG (1000 UT) CAPS Take 1 capsule (1,000 Units total) by mouth daily. 08/28/21  Yes Burns, Bobette Mo,  MD  cyclobenzaprine (FLEXERIL) 10 MG tablet Take 1 tablet (10 mg total) by mouth 2 (two) times daily as needed for muscle spasms. 02/14/24  Yes Kealii Thueson, Lurena Joiner, PA-C  famotidine (PEPCID) 20 MG tablet Take 1 tablet (20 mg total) by mouth 2 (two) times daily. 12/17/22  Yes Esterwood, Amy S, PA-C  HYDROcodone-acetaminophen (NORCO/VICODIN) 5-325 MG tablet Take 1 tablet by mouth every 6 (six) hours as needed for severe pain. 02/03/23  Yes Etta Grandchild, MD  potassium chloride (KLOR-CON) 10 MEQ tablet Take 1 tablet by mouth twice daily 03/05/23  Yes Etta Grandchild, MD  rosuvastatin (CRESTOR) 20 MG tablet Take 1 tablet by mouth once daily 02/08/23  Yes Etta Grandchild, MD   triamterene-hydrochlorothiazide (DYAZIDE) 37.5-25 MG capsule Take 1 capsule by mouth once daily 02/03/23  Yes Etta Grandchild, MD  Accu-Chek FastClix Lancets MISC USE 1  TO CHECK GLUCOSE TWICE DAILY 11/29/19   Etta Grandchild, MD  albuterol (PROVENTIL HFA;VENTOLIN HFA) 108 (90 Base) MCG/ACT inhaler Inhale 2 puffs into the lungs every 4 (four) hours as needed for wheezing or shortness of breath (cough, shortness of breath or wheezing.). 03/11/18   Elvina Sidle, MD  blood glucose meter kit and supplies KIT Use to test blood sugar twice daily. DX: E11.8 07/30/19   Etta Grandchild, MD  glucose blood (ACCU-CHEK GUIDE) test strip Use as instructed twice daily. 11/29/19   Etta Grandchild, MD  levocetirizine (XYZAL) 5 MG tablet TAKE 1 TABLET BY MOUTH ONCE DAILY IN THE EVENING 01/04/23   Etta Grandchild, MD  ondansetron (ZOFRAN-ODT) 4 MG disintegrating tablet Take 1 tablet (4 mg total) by mouth every 8 (eight) hours as needed. 04/03/23   Mardene Sayer, MD  phentermine (ADIPEX-P) 37.5 MG tablet Take 37.5 mg by mouth daily. 11/11/23   [provider]  triamcinolone cream (KENALOG) 0.5 % APPLY ONE APPLICATION TOPICALLY THREE TIMES DAILY. 06/28/22   Etta Grandchild, MD  vitamin B-12 (CYANOCOBALAMIN) 1000 MCG tablet Take 1 tablet (1,000 mcg total) by mouth daily. 08/28/21   Pincus Sanes, MD    Family History Family History  Problem Relation Age of Onset   Arthritis Other    Diabetes Other    Hypertension Other    Breast cancer Other    Hypertension Mother    Diabetes Sister    Hypertension Sister    Breast cancer Daughter    Cancer Neg Hx    Early death Neg Hx    Hearing loss Neg Hx    Heart disease Neg Hx    Hyperlipidemia Neg Hx    Kidney disease Neg Hx    Stroke Neg Hx    Colon cancer Neg Hx     Social History Social History   Tobacco Use   Smoking status: Never    Passive exposure: Yes   Smokeless tobacco: Never   Tobacco comments:    husband smokes outside and inside the  home  Vaping Use   Vaping status: Never Used  Substance Use Topics   Alcohol use: No    Alcohol/week: 0.0 standard drinks of alcohol   Drug use: No     Allergies   Verapamil   Review of Systems Review of Systems  Musculoskeletal:  Positive for back pain.   Per HPI  Physical Exam Triage Vital Signs ED Triage Vitals  Encounter Vitals Group     BP 02/14/24 0947 137/87     Systolic BP Percentile --  Diastolic BP Percentile --      Pulse Rate 02/14/24 0947 71     Resp 02/14/24 0947 20     Temp 02/14/24 0947 98.1 F (36.7 C)     Temp Source 02/14/24 0947 Oral     SpO2 02/14/24 0947 96 %     Weight --      Height --      Head Circumference --      Peak Flow --      Pain Score 02/14/24 0945 10     Pain Loc --      Pain Education --      Exclude from Growth Chart --    No data found.  Updated Vital Signs BP 137/87 (BP Location: Right Arm)   Pulse 71   Temp 98.1 F (36.7 C) (Oral)   Resp 20   LMP 05/17/2012   SpO2 96%   Visual Acuity Right Eye Distance:   Left Eye Distance:   Bilateral Distance:    Right Eye Near:   Left Eye Near:    Bilateral Near:     Physical Exam Vitals and nursing note reviewed.  Constitutional:      General: She is not in acute distress. HENT:     Mouth/Throat:     Mouth: Mucous membranes are moist.     Pharynx: Oropharynx is clear.  Eyes:     Extraocular Movements: Extraocular movements intact.     Conjunctiva/sclera: Conjunctivae normal.     Pupils: Pupils are equal, round, and reactive to light.  Cardiovascular:     Rate and Rhythm: Normal rate and regular rhythm.     Pulses: Normal pulses.          Radial pulses are 2+ on the right side and 2+ on the left side.     Heart sounds: Normal heart sounds.  Pulmonary:     Effort: Pulmonary effort is normal.     Breath sounds: Normal breath sounds.  Musculoskeletal:        General: Normal range of motion.     Cervical back: Normal range of motion. No rigidity or  tenderness.     Comments: Left lumbar back tender to palpation, spasm and tightness. No bony tenderness C-L spine. Decreased ROM of back due to pain. Full ROM of neck. Grip strength intact throughout. Normal sensation   Skin:    General: Skin is warm and dry.  Neurological:     General: No focal deficit present.     Mental Status: She is alert and oriented to person, place, and time.     Cranial Nerves: Cranial nerves 2-12 are intact. No cranial nerve deficit.     Sensory: Sensation is intact.     Motor: Motor function is intact. No weakness.     Coordination: Coordination is intact.     Gait: Gait is intact.     Deep Tendon Reflexes: Reflexes are normal and symmetric.      UC Treatments / Results  Labs (all labs ordered are listed, but only abnormal results are displayed) Labs Reviewed  POCT FASTING CBG [facility] MANUAL ENTRY - Normal    EKG  Radiology No results found.  Procedures Procedures (including critical care time)  Medications Ordered in UC Medications  methylPREDNISolone sodium succinate (SOLU-MEDROL) 125 mg/2 mL injection 60 mg (has no administration in time range)    Initial Impression / Assessment and Plan / UC Course  I have reviewed the triage vital signs and the nursing  notes.  Pertinent labs & imaging results that were available during my care of the patient were reviewed by me and considered in my medical decision making (see chart for details).  Stable vitals  Chronic back pain. No red flags. Discussed urgent care cannot prescribe narcotics as she has a pain management specialist. Suggest calling to discuss balance owed. Can also contact her orthopedic specialist. Recommend to avoid NSAIDs due to CKD. CBG today 88 Offered IM steroid in clinic, patient understands this can raise her blood glucose over the next few days. Baclofen did not help her and did not make her drowsy; will try flexeril 10 mg BID. Drowsy precautions ED precautions are discussed Patient  agrees to plan  Final Clinical Impressions(s) / UC Diagnoses   Final diagnoses:  Chronic left-sided low back pain without sciatica  Muscle spasm of back     Discharge Instructions      The steroid injection should start to work in about 30 minutes  You can try the muscle relaxer (Flexeril) twice daily. Please be cautious as this medicine may make you drowsy.   Use tylenol 1,000 mg every 6-8 hours  Please contact your pain specialist and orthopedics      ED Prescriptions     Medication Sig Dispense Auth. Provider   cyclobenzaprine (FLEXERIL) 10 MG tablet Take 1 tablet (10 mg total) by mouth 2 (two) times daily as needed for muscle spasms. 20 tablet Sy Saintjean, Lurena Joiner, PA-C      I have reviewed the PDMP during this encounter.   Lisanne Ponce, Lurena Joiner, [place] 02/14/24 1031

## 2024-02-14 NOTE — Discharge Instructions (Addendum)
 The steroid injection should start to work in about 30 minutes  You can try the muscle relaxer (Flexeril) twice daily. Please be cautious as this medicine may make you drowsy.   Use tylenol 1,000 mg every 6-8 hours  Please contact your pain specialist and orthopedics

## 2024-02-14 NOTE — ED Triage Notes (Signed)
 Pt states she has an arthritis flare up she is having low back pain and left hip pain X 3 days. She was taking hydrocodone but she is out now and her pain management states they can't refill since she owes a balance.

## 2024-03-19 DIAGNOSIS — I129 Hypertensive chronic kidney disease with stage 1 through stage 4 chronic kidney disease, or unspecified chronic kidney disease: Secondary | ICD-10-CM | POA: Diagnosis not present

## 2024-03-19 DIAGNOSIS — R7303 Prediabetes: Secondary | ICD-10-CM | POA: Diagnosis not present

## 2024-03-19 DIAGNOSIS — M545 Low back pain, unspecified: Secondary | ICD-10-CM | POA: Diagnosis not present

## 2024-03-19 DIAGNOSIS — I10 Essential (primary) hypertension: Secondary | ICD-10-CM | POA: Diagnosis not present

## 2024-03-19 DIAGNOSIS — E782 Mixed hyperlipidemia: Secondary | ICD-10-CM | POA: Diagnosis not present

## 2024-03-19 DIAGNOSIS — N1832 Chronic kidney disease, stage 3b: Secondary | ICD-10-CM | POA: Diagnosis not present

## 2024-03-19 DIAGNOSIS — E559 Vitamin D deficiency, unspecified: Secondary | ICD-10-CM | POA: Diagnosis not present

## 2024-03-19 DIAGNOSIS — J302 Other seasonal allergic rhinitis: Secondary | ICD-10-CM | POA: Diagnosis not present

## 2024-04-16 DIAGNOSIS — I129 Hypertensive chronic kidney disease with stage 1 through stage 4 chronic kidney disease, or unspecified chronic kidney disease: Secondary | ICD-10-CM | POA: Diagnosis not present

## 2024-04-16 DIAGNOSIS — Z79899 Other long term (current) drug therapy: Secondary | ICD-10-CM | POA: Diagnosis not present

## 2024-04-16 DIAGNOSIS — G894 Chronic pain syndrome: Secondary | ICD-10-CM | POA: Diagnosis not present

## 2024-04-16 DIAGNOSIS — N183 Chronic kidney disease, stage 3 unspecified: Secondary | ICD-10-CM | POA: Diagnosis not present

## 2024-04-17 DIAGNOSIS — E1169 Type 2 diabetes mellitus with other specified complication: Secondary | ICD-10-CM | POA: Diagnosis not present

## 2024-05-08 DIAGNOSIS — E1169 Type 2 diabetes mellitus with other specified complication: Secondary | ICD-10-CM | POA: Diagnosis not present

## 2024-05-15 DIAGNOSIS — E1169 Type 2 diabetes mellitus with other specified complication: Secondary | ICD-10-CM | POA: Diagnosis not present

## 2024-05-21 DIAGNOSIS — H542X11 Low vision right eye category 1, low vision left eye category 1: Secondary | ICD-10-CM | POA: Diagnosis not present

## 2024-05-21 DIAGNOSIS — I10 Essential (primary) hypertension: Secondary | ICD-10-CM | POA: Diagnosis not present

## 2024-05-21 DIAGNOSIS — M545 Low back pain, unspecified: Secondary | ICD-10-CM | POA: Diagnosis not present

## 2024-06-19 ENCOUNTER — Ambulatory Visit (HOSPITAL_BASED_OUTPATIENT_CLINIC_OR_DEPARTMENT_OTHER): Admitting: Physician Assistant

## 2024-06-19 ENCOUNTER — Encounter (HOSPITAL_BASED_OUTPATIENT_CLINIC_OR_DEPARTMENT_OTHER): Payer: Self-pay | Admitting: Physician Assistant

## 2024-06-19 ENCOUNTER — Ambulatory Visit (INDEPENDENT_AMBULATORY_CARE_PROVIDER_SITE_OTHER)

## 2024-06-19 DIAGNOSIS — M79672 Pain in left foot: Secondary | ICD-10-CM

## 2024-06-19 DIAGNOSIS — M25572 Pain in left ankle and joints of left foot: Secondary | ICD-10-CM | POA: Diagnosis not present

## 2024-06-19 DIAGNOSIS — M7732 Calcaneal spur, left foot: Secondary | ICD-10-CM | POA: Diagnosis not present

## 2024-06-19 NOTE — Progress Notes (Signed)
 Office Visit Note   Patient: [name]           Date of Birth: [birth year]           MRN: [id number] Visit Date: 06/19/2024              Requested by: Leron Millman, NP [address] [place],  [place] [address] PCP: Leron Millman, NP   Assessment & Plan: Visit Diagnoses:  1. Left foot pain     Plan: Patient comes in today 1 day status post inversion injury to her left ankle.  She said she missed stepped off some stairs and describes an inversion injury she comes in today with mostly pain over the lateral ligaments.  I could not appreciate any fractures that are acute.  She does have some medial sided pain to and some swelling in her foot.  She has a negative squeeze test no proximal fibula pain findings consistent with most likely an ankle sprain we will immobilize her she will do alphabet exercises follow-up in 2 weeks for evaluation also have asked that she do some ankle pumps and alphabet exercises  Follow-Up Instructions: No follow-ups on file.   Orders:  Orders Placed This Encounter  Procedures   DG Foot Complete Left   DG Ankle Complete Left   No orders of the defined types were placed in this encounter.     Procedures: No procedures performed   Clinical Data: No additional findings.   Subjective: Chief Complaint  Patient presents with   Left Foot - Pain    HPI Patient is a pleasant woman comes in today with an inversion injury she sustained to her left ankle yesterday has difficulty bearing weight focuses her pain on the lateral side Review of Systems  All other systems reviewed and are negative.    Objective: Vital Signs: LMP 05/17/2012   Physical Exam Constitutional:      Appearance: Normal appearance.  Pulmonary:     Effort: Pulmonary effort is normal.  Skin:    General: Skin is warm and dry.  Neurological:     General: No focal deficit present.     Mental Status: She is alert and oriented to person, place, and time.  Psychiatric:         Mood and Affect: Mood normal.        Behavior: Behavior normal.     Ortho Exam Examination of left ankle shows moderate soft tissue swelling over the lateral ankle she has dorsiflexion plantarflexion eversion inversion with a little bit limited by pain.  She has most of her tenderness over the lateral ligaments not as much over the distal fibula or the fifth metatarsal.  Medial side some mild pain over the medial ankle joint.  Compartments are soft and nontender she has a negative squeeze test Specialty Comments:  No specialty comments available.  Imaging: No results found.   PMFS History: Patient Active Problem List   Diagnosis Date Noted   Closed displaced fracture of proximal phalanx of lesser toe of left foot 04/28/2023   Polyp of colon 11/22/2022   Epigastric pain 08/30/2022   Long-term current use of opiate analgesic 06/03/2022   Osteoarthritis of multiple joints 12/29/2021   Screening for cervical cancer 10/23/2021   Need for shingles vaccine 10/23/2021   Encounter for general adult medical examination with abnormal findings 10/22/2021   Chronic painful diabetic neuropathy (HCC) 09/21/2021   Prolonged Q-T interval on ECG 10/18/2019   Type 2 diabetes mellitus  with obesity (HCC) 07/11/2019   Intrinsic eczema 07/04/2019   CKD (chronic kidney disease) stage 3, GFR 30-59 ml/min (HCC) 10/11/2018   Class 3 severe obesity due to excess calories with serious comorbidity and body mass index (BMI) of 40.0 to 44.9 in adult 09/14/2018   Chronic right-sided low back pain with right-sided sciatica 09/14/2018   Hyperlipidemia with target LDL less than 130 07/05/2018   Mild intermittent asthma without complication 07/05/2018   Snoring 12/14/2016   Essential hypertension 12/14/2016   Gastroesophageal reflux disease with esophagitis 08/26/2016   Dysphagia 08/26/2016   Spondylolisthesis of lumbar region 05/28/2016   Fibromyalgia 08/08/2015   Lumbar facet arthropathy 08/08/2015    Primary osteoarthritis of both knees 08/25/2014   Encounter for screening mammogram for breast cancer 06/01/2013   Abnormal electrocardiogram 02/05/2010   ALLERGIC RHINITIS 01/26/2010   Past Medical History:  Diagnosis Date   Allergy    rhinitis   Anemia    Arthritis    Asthma    Diabetes mellitus without complication (HCC)    Headache(784.0)    History of stomach ulcers    Hypertension    Internal hemorrhoids     Family History  Problem Relation Age of Onset   Arthritis Other    Diabetes Other    Hypertension Other    Breast cancer Other    Hypertension Mother    Diabetes Sister    Hypertension Sister    Breast cancer Daughter    Cancer Neg Hx    Early death Neg Hx    Hearing loss Neg Hx    Heart disease Neg Hx    Hyperlipidemia Neg Hx    Kidney disease Neg Hx    Stroke Neg Hx    Colon cancer Neg Hx     Past Surgical History:  Procedure Laterality Date   CARPAL TUNNEL RELEASE Right    TUBAL LIGATION     Social History   Occupational History   Occupation: disabilty  Tobacco Use   Smoking status: Never    Passive exposure: Yes   Smokeless tobacco: Never   Tobacco comments:    husband smokes outside and inside the home  Vaping Use   Vaping status: Never Used  Substance and Sexual Activity   Alcohol  use: No    Alcohol /week: 0.0 standard drinks of alcohol    Drug use: No   Sexual activity: Yes    Birth control/protection: Post-menopausal, Surgical

## 2024-06-29 ENCOUNTER — Telehealth: Payer: Self-pay | Admitting: Physician Assistant

## 2024-06-29 NOTE — Telephone Encounter (Signed)
 Pt called requesting a call back from Ronal Caldron. She states she has medical question for pains in her feet. Pt has an appt but need medical advice. Please call pt at [phone].

## 2024-07-02 NOTE — Telephone Encounter (Signed)
 Called patient and let her know that [name] wanted to have more info on the medical question about her feet. Patent stated she was ok, just will see her on the appt she have set.

## 2024-07-03 DIAGNOSIS — S93402A Sprain of unspecified ligament of left ankle, initial encounter: Secondary | ICD-10-CM | POA: Diagnosis not present

## 2024-07-03 DIAGNOSIS — G894 Chronic pain syndrome: Secondary | ICD-10-CM | POA: Diagnosis not present

## 2024-07-03 DIAGNOSIS — I10 Essential (primary) hypertension: Secondary | ICD-10-CM | POA: Diagnosis not present

## 2024-07-10 ENCOUNTER — Ambulatory Visit (HOSPITAL_BASED_OUTPATIENT_CLINIC_OR_DEPARTMENT_OTHER): Admitting: Physician Assistant

## 2024-07-10 ENCOUNTER — Ambulatory Visit (INDEPENDENT_AMBULATORY_CARE_PROVIDER_SITE_OTHER)

## 2024-07-10 ENCOUNTER — Encounter (HOSPITAL_BASED_OUTPATIENT_CLINIC_OR_DEPARTMENT_OTHER): Payer: Self-pay | Admitting: Physician Assistant

## 2024-07-10 ENCOUNTER — Ambulatory Visit (HOSPITAL_BASED_OUTPATIENT_CLINIC_OR_DEPARTMENT_OTHER): Admitting: Student

## 2024-07-10 DIAGNOSIS — M2142 Flat foot [pes planus] (acquired), left foot: Secondary | ICD-10-CM | POA: Diagnosis not present

## 2024-07-10 DIAGNOSIS — M7732 Calcaneal spur, left foot: Secondary | ICD-10-CM | POA: Diagnosis not present

## 2024-07-10 DIAGNOSIS — M79672 Pain in left foot: Secondary | ICD-10-CM | POA: Diagnosis not present

## 2024-07-10 DIAGNOSIS — M19072 Primary osteoarthritis, left ankle and foot: Secondary | ICD-10-CM | POA: Diagnosis not present

## 2024-07-10 NOTE — Progress Notes (Signed)
 X-ray gel hey Vernell is Levada Sade Hollon how are you good I will cue up I am sorry well I am good I am I think the change that up okay and I I  Office Visit Note   Patient: [name]           Date of Birth: 18-Dec-1960           MRN: [id number] Visit Date: 07/10/2024              Requested by: Leron Vernell, NP [phone] [address] [address] [place],  [place] [address] PCP: Leron Vernell, NP  Chief Complaint  Patient presents with   Left Foot - Follow-up      HPI: Patient is a pleasant 63 year old woman for follow-up on her left ankle sprain.  With regards to her ankle she feels it is feeling much better she has been immobilized in a tall cam boot.  She still has pain over the lateral midfoot.  She said this is the major area of her pain now.  Assessment & Plan: Visit Diagnoses:  1. Left foot pain     Plan: Patient will be transition into a ASO brace as tolerated.  She can go back and forth her but she still seems to be painful but x-rays today are reassuring.  No problem over the Lisfranc joint.  Should follow-up with Leonce in 3 weeks.  Have given her a Thera-Band and home directed exercises for ankle strengthening  Follow-Up Instructions: No follow-ups on file.   Ortho Exam  Patient is alert, oriented, no adenopathy, well-dressed, normal affect, normal respiratory effort. Examination of her left foot she has a strong pulse she is neurovascular intact no tenderness to minimal tenderness over the lateral ligaments she still has some tenderness over the fourth TMT area.  She has good strength with eversion inversion dorsiflexion and plantarflexion no tenderness over the Lisfranc complex    Imaging: No results found. No images are attached to the encounter.  Labs: Lab Results  Component Value Date   HGBA1C 6.8 (H) 11/22/2022   HGBA1C 6.6 (H) 06/03/2022   HGBA1C 6.4 10/22/2021   ESRSEDRATE 68 (H) 07/11/2019   ESRSEDRATE 21 05/17/2012   REPTSTATUS 08/28/2009 FINAL 08/27/2009    GRAMSTAIN NO WBC SEEN NO ORGANISMS SEEN 08/27/2009   CULT NO GROWTH 3 DAYS 08/27/2009     Lab Results  Component Value Date   ALBUMIN 3.5 04/03/2023   ALBUMIN 3.9 12/17/2022   ALBUMIN 4.2 08/30/2022    Lab Results  Component Value Date   MG 2.2 10/18/2019   MG 2.4 10/11/2018   MG 2.1 09/05/2018   No results found for: VD25OH  No results found for: PREALBUMIN    Latest Ref Rng & Units 04/03/2023   10:13 AM 12/17/2022    9:13 AM 08/30/2022    4:21 PM  CBC EXTENDED  WBC 4.0 - 10.5 K/uL 9.7  4.6  6.7   RBC 3.87 - 5.11 MIL/uL 5.64  5.08  5.11   Hemoglobin 12.0 - 15.0 g/dL 85.7  86.9  86.7   HCT 36.0 - 46.0 % 44.8  40.4  41.2   Platelets 150 - 400 K/uL 255  228.0  225.0   NEUT# 1.7 - 7.7 K/uL 5.3  1.9  2.6   Lymph# 0.7 - 4.0 K/uL 3.5  2.0  3.1      There is no height or weight on file to calculate BMI.  Orders:  Orders Placed This Encounter  Procedures  DG Foot Complete Left   No orders of the defined types were placed in this encounter.    Procedures: No procedures performed  Clinical Data: No additional findings.  ROS:  All other systems negative, except as noted in the HPI. Review of Systems  Objective: Vital Signs: LMP 05/17/2012   Specialty Comments:  No specialty comments available.  PMFS History: Patient Active Problem List   Diagnosis Date Noted   Closed displaced fracture of proximal phalanx of lesser toe of left foot 04/28/2023   Polyp of colon 11/22/2022   Epigastric pain 08/30/2022   Long-term current use of opiate analgesic 06/03/2022   Osteoarthritis of multiple joints 12/29/2021   Screening for cervical cancer 10/23/2021   Need for shingles vaccine 10/23/2021   Encounter for general adult medical examination with abnormal findings 10/22/2021   Chronic painful diabetic neuropathy (HCC) 09/21/2021   Prolonged Q-T interval on ECG 10/18/2019   Type 2 diabetes mellitus with obesity (HCC) 07/11/2019   Intrinsic eczema 07/04/2019    CKD (chronic kidney disease) stage 3, GFR 30-59 ml/min (HCC) 10/11/2018   Class 3 severe obesity due to excess calories with serious comorbidity and body mass index (BMI) of 40.0 to 44.9 in adult 09/14/2018   Chronic right-sided low back pain with right-sided sciatica 09/14/2018   Hyperlipidemia with target LDL less than 130 07/05/2018   Mild intermittent asthma without complication 07/05/2018   Snoring 12/14/2016   Essential hypertension 12/14/2016   Gastroesophageal reflux disease with esophagitis 08/26/2016   Dysphagia 08/26/2016   Spondylolisthesis of lumbar region 05/28/2016   Fibromyalgia 08/08/2015   Lumbar facet arthropathy 08/08/2015   Primary osteoarthritis of both knees 08/25/2014   Encounter for screening mammogram for breast cancer 06/01/2013   Abnormal electrocardiogram 02/05/2010   ALLERGIC RHINITIS 01/26/2010   Past Medical History:  Diagnosis Date   Allergy    rhinitis   Anemia    Arthritis    Asthma    Diabetes mellitus without complication (HCC)    Headache(784.0)    History of stomach ulcers    Hypertension    Internal hemorrhoids     Family History  Problem Relation Age of Onset   Arthritis Other    Diabetes Other    Hypertension Other    Breast cancer Other    Hypertension Mother    Diabetes Sister    Hypertension Sister    Breast cancer Daughter    Cancer Neg Hx    Early death Neg Hx    Hearing loss Neg Hx    Heart disease Neg Hx    Hyperlipidemia Neg Hx    Kidney disease Neg Hx    Stroke Neg Hx    Colon cancer Neg Hx     Past Surgical History:  Procedure Laterality Date   CARPAL TUNNEL RELEASE Right    TUBAL LIGATION     Social History   Occupational History   Occupation: disabilty  Tobacco Use   Smoking status: Never    Passive exposure: Yes   Smokeless tobacco: Never   Tobacco comments:    husband smokes outside and inside the home  Vaping Use   Vaping status: Never Used  Substance and Sexual Activity   Alcohol  use: No     Alcohol /week: 0.0 standard drinks of alcohol    Drug use: No   Sexual activity: Yes    Birth control/protection: Post-menopausal, Surgical

## 2024-07-31 ENCOUNTER — Encounter (HOSPITAL_BASED_OUTPATIENT_CLINIC_OR_DEPARTMENT_OTHER): Payer: Self-pay | Admitting: Student

## 2024-07-31 ENCOUNTER — Ambulatory Visit (HOSPITAL_BASED_OUTPATIENT_CLINIC_OR_DEPARTMENT_OTHER): Admitting: Student

## 2024-07-31 DIAGNOSIS — M25572 Pain in left ankle and joints of left foot: Secondary | ICD-10-CM | POA: Diagnosis not present
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
## 2024-09-03 ENCOUNTER — Encounter: Payer: Self-pay | Admitting: *Deleted

## 2024-09-03 NOTE — Progress Notes (Signed)
 [name]                                          MRN: [id number]   09/03/2024   The [facility] Quality Team Specialist reviewed this patient medical record for the purposes of chart review for care gap closure. The following were reviewed: chart review for care gap closure-glycemic status assessment and kidney health evaluation for diabetes:eGFR  and uACR.    [facility] Quality Team

## 2024-09-10 ENCOUNTER — Encounter: Payer: Self-pay | Admitting: Radiology

## 2024-11-07 ENCOUNTER — Ambulatory Visit (HOSPITAL_COMMUNITY): Admission: EM | Admit: 2024-11-07 | Discharge: 2024-11-07 | Disposition: A | Discharge status: Home / Self Care

## 2024-11-07 ENCOUNTER — Encounter (HOSPITAL_COMMUNITY): Payer: Self-pay | Admitting: Emergency Medicine

## 2024-11-07 DIAGNOSIS — Z5189 Encounter for other specified aftercare: Secondary | ICD-10-CM | POA: Diagnosis not present
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
